# Patient Record
Sex: Male | Born: 1955 | Race: White | Hispanic: No | Marital: Married | Smoking: Current some day smoker
Health system: Southern US, Community
[De-identification: ages and names within clinical notes are randomized; demographics above are authoritative.]

## PROBLEM LIST (undated history)

## (undated) DIAGNOSIS — K219 Gastro-esophageal reflux disease without esophagitis: Secondary | ICD-10-CM

## (undated) DIAGNOSIS — I219 Acute myocardial infarction, unspecified: Secondary | ICD-10-CM

## (undated) DIAGNOSIS — I10 Essential (primary) hypertension: Secondary | ICD-10-CM

## (undated) DIAGNOSIS — E119 Type 2 diabetes mellitus without complications: Secondary | ICD-10-CM

## (undated) DIAGNOSIS — I2510 Atherosclerotic heart disease of native coronary artery without angina pectoris: Secondary | ICD-10-CM

## (undated) DIAGNOSIS — J45909 Unspecified asthma, uncomplicated: Secondary | ICD-10-CM

## (undated) DIAGNOSIS — Z87442 Personal history of urinary calculi: Secondary | ICD-10-CM

## (undated) DIAGNOSIS — E039 Hypothyroidism, unspecified: Secondary | ICD-10-CM

## (undated) DIAGNOSIS — E785 Hyperlipidemia, unspecified: Secondary | ICD-10-CM

## (undated) DIAGNOSIS — R011 Cardiac murmur, unspecified: Secondary | ICD-10-CM

## (undated) DIAGNOSIS — G4730 Sleep apnea, unspecified: Secondary | ICD-10-CM

## (undated) HISTORY — DX: Type 2 diabetes mellitus without complications: E11.9

## (undated) HISTORY — PX: THYROIDECTOMY: SHX17

## (undated) HISTORY — DX: Hypothyroidism, unspecified: E03.9

## (undated) HISTORY — PX: GANGLION CYST EXCISION: SHX1691

## (undated) HISTORY — DX: Essential (primary) hypertension: I10

## (undated) HISTORY — PX: NASAL SEPTUM SURGERY: SHX37

## (undated) HISTORY — PX: CORONARY ANGIOPLASTY WITH STENT PLACEMENT: SHX49

## (undated) HISTORY — DX: Hyperlipidemia, unspecified: E78.5

## (undated) HISTORY — DX: Sleep apnea, unspecified: G47.30

## (undated) HISTORY — DX: Acute myocardial infarction, unspecified: I21.9

## (undated) HISTORY — DX: Atherosclerotic heart disease of native coronary artery without angina pectoris: I25.10

---

## 1991-01-31 HISTORY — PX: CARDIAC CATHETERIZATION: SHX172

## 1992-01-31 HISTORY — PX: CORONARY ANGIOPLASTY WITH STENT PLACEMENT: SHX49

## 1994-01-30 DIAGNOSIS — I219 Acute myocardial infarction, unspecified: Secondary | ICD-10-CM

## 1994-01-30 HISTORY — DX: Acute myocardial infarction, unspecified: I21.9

## 1994-01-30 HISTORY — PX: CORONARY ANGIOPLASTY WITH STENT PLACEMENT: SHX49

## 2000-01-31 HISTORY — PX: CARDIAC CATHETERIZATION: SHX172

## 2004-01-31 HISTORY — PX: CORONARY ANGIOPLASTY WITH STENT PLACEMENT: SHX49

## 2011-01-06 LAB — LIPID PANEL
HDL: 47 mg/dL (ref 35–70)
LDL Cholesterol: 67 mg/dL

## 2011-03-26 LAB — HM DIABETES EYE EXAM

## 2012-05-21 ENCOUNTER — Encounter: Payer: Self-pay | Admitting: Cardiovascular Disease

## 2012-05-21 ENCOUNTER — Ambulatory Visit (INDEPENDENT_AMBULATORY_CARE_PROVIDER_SITE_OTHER): Payer: BC Managed Care – PPO | Admitting: Cardiovascular Disease

## 2012-05-21 VITALS — BP 120/86 | HR 80 | Ht 68.0 in | Wt 213.2 lb

## 2012-05-21 DIAGNOSIS — Z9861 Coronary angioplasty status: Secondary | ICD-10-CM

## 2012-05-21 DIAGNOSIS — I10 Essential (primary) hypertension: Secondary | ICD-10-CM | POA: Insufficient documentation

## 2012-05-21 DIAGNOSIS — E785 Hyperlipidemia, unspecified: Secondary | ICD-10-CM | POA: Insufficient documentation

## 2012-05-21 DIAGNOSIS — Z955 Presence of coronary angioplasty implant and graft: Secondary | ICD-10-CM | POA: Insufficient documentation

## 2012-05-21 DIAGNOSIS — E119 Type 2 diabetes mellitus without complications: Secondary | ICD-10-CM

## 2012-05-21 DIAGNOSIS — I2510 Atherosclerotic heart disease of native coronary artery without angina pectoris: Secondary | ICD-10-CM

## 2012-05-21 MED ORDER — AMLODIPINE BESYLATE 5 MG PO TABS: 5.0000 mg | ORAL_TABLET | Freq: Every day | ORAL | Status: DC

## 2012-05-21 MED ORDER — RAMIPRIL 5 MG PO CAPS: 5.0000 mg | ORAL_CAPSULE | Freq: Every day | ORAL | Status: DC

## 2012-05-21 MED ORDER — SIMVASTATIN 40 MG PO TABS: 40.0000 mg | ORAL_TABLET | Freq: Every day | ORAL | Status: DC

## 2012-05-21 MED ORDER — CLOPIDOGREL BISULFATE 75 MG PO TABS: 75.0000 mg | ORAL_TABLET | Freq: Every day | ORAL | Status: DC

## 2012-05-21 MED ORDER — METOPROLOL TARTRATE 25 MG PO TABS: 25.0000 mg | ORAL_TABLET | Freq: Two times a day (BID) | ORAL | Status: DC

## 2012-05-21 NOTE — Assessment & Plan Note (Signed)
We have suggested he O2 his appointment on Thursday fasting. Perhaps at that time they could do routine blood work including lipids and LFTs.

## 2012-05-21 NOTE — Patient Instructions (Addendum)
You are doing well. Please start plavix one a day  Please call us if you have new issues that need to be addressed before your next appt.  Your physician wants you to follow-up in: 6 months.  You will receive a reminder letter in the mail two months in advance. If you don't receive a letter, please call our office to schedule the follow-up appointment.

## 2012-05-21 NOTE — Assessment & Plan Note (Signed)
He reports that his sugars are running high, higher than he would like. We have encouraged him to talk with Dr. Ermalene Searing, watch his diet and increase exercise in an effort to lose weight.

## 2012-05-21 NOTE — Progress Notes (Signed)
Patient ID: Juan [name], male    DOB: 01-02-1956, 57 y.o.   MRN: [id number]  HPI Comments: Mr. Juan [name] is a very pleasant 57 year old with a history of premature coronary artery disease, multiple cardiac catheterizations with intervention in the past dating back to 1994, last catheterization in 2006 by his report in [place], total of 8 stents per the patient who presents to establish care. History of diabetes and hyperlipidemia.  He has been in the state for one year or less and presenting to establish care. He denies having any symptoms of shortness of breath or chest pain. He is very active, works in a [employer]. He reports that his sugars have been higher than he would like. He is also running out of medications. Previously prior to stenting, he had chest pain. He denies any recent chest pain symptoms of shortness of breath with exertion.  No smoking history. Reports his father also had coronary artery disease in his 8s.  EKG shows normal sinus rhythm with rate 80 beats per minute with no significant ST or T wave changes.      Outpatient Encounter Prescriptions as of 05/21/2012  Medication Sig Dispense Refill  . amLODipine (NORVASC) 5 MG tablet Take 1 tablet (5 mg total) by mouth daily.  90 tablet  3  . aspirin 81 MG tablet Take 81 mg by mouth daily.      . B Complex Vitamins (VITAMIN B COMPLEX PO) Take by mouth daily.      . folic acid (FOLVITE) 800 MCG tablet Take 400 mcg by mouth daily.      Marland Kitchen glimepiride (AMARYL) 2 MG tablet Take 2 mg by mouth daily before breakfast.      . levothyroxine (SYNTHROID, LEVOTHROID) 100 MCG tablet Take 100 mcg by mouth daily.       . metFORMIN (GLUCOPHAGE) 500 MG tablet Take 500 mg by mouth 2 (two) times daily with a meal.       . metoprolol tartrate (LOPRESSOR) 25 MG tablet Take 1 tablet (25 mg total) by mouth 2 (two) times daily.  180 tablet  3  . Multiple Vitamin (MULTIVITAMIN) tablet Take 1 tablet by mouth daily.      . nitroGLYCERIN  (NITROSTAT) 0.4 MG SL tablet Place 0.4 mg under the tongue every 5 (five) minutes as needed for chest pain.      . Omega-3 Fatty Acids (FISH OIL PO) Take by mouth daily.      . ramipril (ALTACE) 5 MG capsule Take 1 capsule (5 mg total) by mouth daily.  90 capsule  3  . simvastatin (ZOCOR) 40 MG tablet Take 1 tablet (40 mg total) by mouth daily.  90 tablet  3   Review of Systems  Constitutional: Negative.   HENT: Negative.   Eyes: Negative.   Respiratory: Negative.   Cardiovascular: Negative.   Gastrointestinal: Negative.   Musculoskeletal: Negative.   Skin: Negative.   Neurological: Negative.   Psychiatric/Behavioral: Negative.   All other systems reviewed and are negative.    BP 120/86  Pulse 80  Ht 5\' 8"  (1.727 m)  Wt 213 lb 4 oz (96.73 kg)  BMI 32.43 kg/m2  Physical Exam  Nursing note and vitals reviewed. Constitutional: He is oriented to person, place, and time. He appears well-developed and well-nourished.  HENT:  Head: Normocephalic.  Nose: Nose normal.  Mouth/Throat: Oropharynx is clear and moist.  Eyes: Conjunctivae are normal. Pupils are equal, round, and reactive to light.  Neck: Normal range of motion.  Neck supple. No JVD present.  Cardiovascular: Normal rate, regular rhythm, S1 normal, S2 normal, normal heart sounds and intact distal pulses.  Exam reveals no gallop and no friction rub.   No murmur heard. Pulmonary/Chest: Effort normal and breath sounds normal. No respiratory distress. He has no wheezes. He has no rales. He exhibits no tenderness.  Abdominal: Soft. Bowel sounds are normal. He exhibits no distension. There is no tenderness.  Musculoskeletal: Normal range of motion. He exhibits no edema and no tenderness.  Lymphadenopathy:    He has no cervical adenopathy.  Neurological: He is alert and oriented to person, place, and time. Coordination normal.  Skin: Skin is warm and dry. No rash noted. No erythema.  Psychiatric: He has a normal mood and affect.  His behavior is normal. Judgment and thought content normal.      Assessment and Plan        will

## 2012-05-21 NOTE — Assessment & Plan Note (Signed)
Blood pressure is well controlled on today's visit. No changes made to the medications. We have renewed his medications.

## 2012-05-21 NOTE — Assessment & Plan Note (Signed)
Currently with no symptoms of angina. No further workup at this time. Continue current medication regimen. 

## 2012-05-21 NOTE — Assessment & Plan Note (Signed)
We will try to obtain his prior records are our system. He reports having 8 stents in the past. Currently with no symptoms.

## 2012-05-23 ENCOUNTER — Ambulatory Visit (INDEPENDENT_AMBULATORY_CARE_PROVIDER_SITE_OTHER): Payer: BC Managed Care – PPO | Admitting: Family Medicine

## 2012-05-23 ENCOUNTER — Telehealth: Payer: Self-pay | Admitting: Family Medicine

## 2012-05-23 ENCOUNTER — Encounter: Payer: Self-pay | Admitting: Family Medicine

## 2012-05-23 VITALS — BP 130/82 | HR 88 | Temp 98.1°F | Ht 68.0 in | Wt 208.4 lb

## 2012-05-23 DIAGNOSIS — E890 Postprocedural hypothyroidism: Secondary | ICD-10-CM

## 2012-05-23 DIAGNOSIS — E119 Type 2 diabetes mellitus without complications: Secondary | ICD-10-CM

## 2012-05-23 DIAGNOSIS — I10 Essential (primary) hypertension: Secondary | ICD-10-CM

## 2012-05-23 DIAGNOSIS — Z9889 Other specified postprocedural states: Secondary | ICD-10-CM

## 2012-05-23 DIAGNOSIS — E785 Hyperlipidemia, unspecified: Secondary | ICD-10-CM

## 2012-05-23 DIAGNOSIS — Z9009 Acquired absence of other part of head and neck: Secondary | ICD-10-CM | POA: Insufficient documentation

## 2012-05-23 DIAGNOSIS — Z125 Encounter for screening for malignant neoplasm of prostate: Secondary | ICD-10-CM

## 2012-05-23 DIAGNOSIS — Z0000 Encounter for general adult medical examination without abnormal findings: Secondary | ICD-10-CM

## 2012-05-23 LAB — COMPREHENSIVE METABOLIC PANEL
BUN: 13 mg/dL (ref 6–23)
CO2: 29 mEq/L (ref 19–32)
Calcium: 9.4 mg/dL (ref 8.4–10.5)
Chloride: 98 mEq/L (ref 96–112)
Creatinine, Ser: 0.8 mg/dL (ref 0.4–1.5)
GFR: 103.36 mL/min (ref >60.00)
Glucose, Bld: 247 mg/dL — ABNORMAL HIGH (ref 70–99)

## 2012-05-23 LAB — LIPID PANEL
HDL: 33.7 mg/dL — ABNORMAL LOW (ref >39.00)
Triglycerides: 445 mg/dL — ABNORMAL HIGH (ref 0.0–149.0)

## 2012-05-23 MED ORDER — GLIMEPIRIDE 4 MG PO TABS: ORAL_TABLET | ORAL | Status: DC

## 2012-05-23 NOTE — Patient Instructions (Addendum)
MAke appt for yearly eye exam. We will call with lab results. Follow up in 3 months with fasting labs prior.

## 2012-05-23 NOTE — Telephone Encounter (Signed)
Notify pt that LDL is at goal on simvastatin, triglycerides high likely reflecting too many carbs. A1C suggests very poorly controlled diabetes. He is on amaryl and max metformin. He can increase amaryl to  4 mg  Two tabs daily for total of 8 mg daily. This will likely help a little but not much. We may need to add an additional med.   Have him work on low carb, increase exercise and work on weight loss. Let me know if he is agreeable to nutrition referral. Have him make a follow up appt in 1 mth... Bring in his daily fasting blood sugar measurements at that OV. We can adjust/ add med if not at goal at that time.

## 2012-05-23 NOTE — Progress Notes (Signed)
Subjective:    Patient ID: Juan [name], male    DOB: Feb 19, 1955, 57 y.o.   MRN: [id number]  HPI 57 year old male moved from [place] 1 year ago. Juan [name] is a very pleasant 57 year old with a history of premature coronary artery disease, multiple cardiac catheterizations with intervention in the past dating back to 1994, last catheterization in 2006 by his report in [place], total of 8 stents per the patient who presents to establish care.   Last OV with PCP in [place] was 12/2010  Saw cardiologist  In [place] 03/2011  He established with Dr. Mariah Milling on 05/21/2012  Started on plavix.  He denies having any symptoms of shortness of breath or chest pain. He is very active, works in a [employer] engineer, Education officer, community. He reports that his sugars have been higher than he would like. He is also running out of medications. Previously prior to stenting, he had chest pain. He denies any recent chest pain symptoms of shortness of breath with exertion.  No smoking history.  Reports his father also had coronary artery disease in his 17s.    Diabetes: Dx 3 years ago. On amaryl and metfromin ER . Last A1C 7.1 in 12/2010 Using medications without difficulties:None Hypoglycemic episodes: None Hyperglycemic episodes: yes Feet problems:None Blood Sugars averaging: 120-220 eye exam within last year: yes    Hypertension:  Well controlled on  Amlodipine, metoprolol and ramipril   Using medication without problems or lightheadedness: None Chest pain with exertion:None Edema:none Short of breath:None Average home BPs: at goal <130/80 Other issues:  Elevated Cholesterol: Due for re-eval. Goal <70 given CAD  LFTs increased with crestor and lipitor in past. Using medications without problems: None Muscle aches: None Diet compliance:Moderate Exercise: walks Other complaints:   Review of Systems  Constitutional: Negative for fever, fatigue and unexpected weight change.  HENT:  Negative for ear pain, congestion, sore throat, rhinorrhea, trouble swallowing and postnasal drip.   Eyes: Negative for pain.  Respiratory: Negative for cough, shortness of breath and wheezing.   Cardiovascular: Negative for chest pain, palpitations and leg swelling.  Gastrointestinal: Negative for nausea, abdominal pain, diarrhea, constipation and blood in stool.  Genitourinary: Negative for dysuria, urgency, hematuria, discharge, penile swelling, scrotal swelling, difficulty urinating, penile pain and testicular pain.  Skin: Negative for rash.  Neurological: Negative for syncope, weakness, light-headedness, numbness and headaches.  Psychiatric/Behavioral: Negative for behavioral problems and dysphoric mood. The patient is not nervous/anxious.        Objective:   Physical Exam  Constitutional: Vital signs are normal. He appears well-developed and well-nourished.  overweight  HENT:  Head: Normocephalic.  Right Ear: Hearing normal.  Left Ear: Hearing normal.  Nose: Nose normal.  Mouth/Throat: Oropharynx is clear and moist and mucous membranes are normal.  Neck: Trachea normal. Carotid bruit is not present. No mass and no thyromegaly present.  Cardiovascular: Normal rate, regular rhythm and normal pulses.  Exam reveals no gallop, no distant heart sounds and no friction rub.   No murmur heard. No peripheral edema  Pulmonary/Chest: Effort normal and breath sounds normal. No respiratory distress.  Skin: Skin is warm, dry and intact. No rash noted.  Psychiatric: He has a normal mood and affect. His speech is normal and behavior is normal. Thought content normal.   Diabetic foot exam: Normal inspection No skin breakdown No calluses  Normal DP pulses Normal sensation to light touch and monofilament Nails normal        Assessment & Plan:  The patient's preventative maintenance and recommended screening tests for an annual wellness exam were reviewed in full today. Brought up to  date unless services declined.  Counselled on the importance of diet, exercise, and its role in overall health and mortality. The patient's FH and SH was reviewed, including their home life, tobacco status, and drug and alcohol status.   Colonoscopy 12/2010: 1 polyp repeat in 5 years Prostate; due for PSA Vaccines: TD ? But thinks in last 4-5 years. No STD screen  Non smoker

## 2012-05-24 ENCOUNTER — Telehealth: Payer: Self-pay | Admitting: *Deleted

## 2012-05-24 MED ORDER — GLIMEPIRIDE 4 MG PO TABS: 8.0000 mg | ORAL_TABLET | Freq: Every day | ORAL | Status: DC

## 2012-05-24 NOTE — Addendum Note (Signed)
Addended by: Kerby Nora E on: 05/24/2012 01:48 PM   Modules accepted: Orders

## 2012-05-24 NOTE — Telephone Encounter (Signed)
Patient advised and he is okay with referral also appt made

## 2012-05-24 NOTE — Telephone Encounter (Signed)
Pharmacy ([facility] [address]) wants to clarify if pt is to take 2 mg of glimepiride 4 mg rx or 2 tablets daily.

## 2012-05-24 NOTE — Telephone Encounter (Signed)
I am trying to increase him up to 4 mg  Two tabs daily ( 8 mg total daily)

## 2012-06-06 ENCOUNTER — Telehealth: Payer: Self-pay | Admitting: Family Medicine

## 2012-06-06 NOTE — Telephone Encounter (Signed)
Opened in error

## 2012-06-17 ENCOUNTER — Other Ambulatory Visit: Payer: Self-pay | Admitting: *Deleted

## 2012-06-17 MED ORDER — METFORMIN HCL ER 500 MG PO TB24: 1000.0000 mg | ORAL_TABLET | Freq: Every day | ORAL | Status: DC

## 2012-06-20 ENCOUNTER — Ambulatory Visit: Payer: Self-pay | Admitting: Family Medicine

## 2012-06-28 ENCOUNTER — Encounter: Payer: Self-pay | Admitting: Family Medicine

## 2012-06-28 ENCOUNTER — Ambulatory Visit (INDEPENDENT_AMBULATORY_CARE_PROVIDER_SITE_OTHER): Payer: BC Managed Care – PPO | Admitting: Family Medicine

## 2012-06-28 VITALS — BP 120/78 | HR 79 | Temp 98.5°F | Ht 68.0 in | Wt 212.5 lb

## 2012-06-28 DIAGNOSIS — E785 Hyperlipidemia, unspecified: Secondary | ICD-10-CM

## 2012-06-28 DIAGNOSIS — E119 Type 2 diabetes mellitus without complications: Secondary | ICD-10-CM

## 2012-06-28 DIAGNOSIS — I10 Essential (primary) hypertension: Secondary | ICD-10-CM

## 2012-06-28 NOTE — Assessment & Plan Note (Signed)
Will continue max metformin and max glimperide and add exercise and weight loss. Diet is already stellar.  Recheck in 3 months.

## 2012-06-28 NOTE — Assessment & Plan Note (Signed)
Well controlled. Continue current medication.  

## 2012-06-28 NOTE — Patient Instructions (Addendum)
Working on increasing exercise. Keep up with healthy eating. Continue current meds. Move back next OV and fasting labs prior to 3 months from now NOT 2 months.Marland Kitchen

## 2012-06-28 NOTE — Assessment & Plan Note (Signed)
Inadequate control. Has not tolerated  crestor or lipitor in past due to LFT elevation.  Will work on lifestyle

## 2012-06-28 NOTE — Progress Notes (Signed)
  Subjective:    Patient ID: Juan [name], male    DOB: [birth year], 56 y.o.   MRN: [id number]  HPI  57 year old male with CAD presents with  Hypertension:   Well controlled on metoprolol and altace  Using medication without problems or lightheadedness: none Chest pain with exertion:None Edema:None Short of breath:None Average home BPs: stable Other issues:  Wt Readings from Last 3 Encounters:  06/28/12 212 lb 8 oz (96.389 kg)  05/23/12 208 lb 6.4 oz (94.53 kg)  05/21/12 213 lb 4 oz (96.73 kg)     Diabetes:  On metformin, glimperide 2 tabs a day.  A1C is from before being on higher glimperide. Lab Results  Component Value Date   HGBA1C 8.9* 05/23/2012  Using medications without difficulties: Hypoglycemic episodes: none Hyperglycemic episodes:occ Feet problems: None Blood Sugars averaging: FBS 170 Has seen a nutritionist.  he is working aggressively on diet. Minimal exercsie.  Elevated Cholesterol: Almost at goal < 70 on 40 mg simvastatin daily Lab Results  Component Value Date   CHOL 211* 05/23/2012   HDL 33.70* 05/23/2012   LDLDIRECT 94.2 05/23/2012   TRIG 445.0 Triglyceride is over 400; calculations on Lipids are invalid.* 05/23/2012   CHOLHDL 6 05/23/2012  Using medications without problems: Muscle aches:  Diet compliance: Exercise: Other complaints:   Review of Systems     Objective:   Physical Exam        Assessment & Plan:

## 2012-06-30 ENCOUNTER — Ambulatory Visit: Payer: Self-pay | Admitting: Family Medicine

## 2012-07-10 ENCOUNTER — Encounter: Payer: Self-pay | Admitting: *Deleted

## 2012-07-23 ENCOUNTER — Other Ambulatory Visit: Payer: Self-pay | Admitting: *Deleted

## 2012-07-23 MED ORDER — METFORMIN HCL ER 500 MG PO TB24: 1000.0000 mg | ORAL_TABLET | Freq: Two times a day (BID) | ORAL | Status: DC

## 2012-07-23 MED ORDER — GLIMEPIRIDE 4 MG PO TABS: 8.0000 mg | ORAL_TABLET | Freq: Every day | ORAL | Status: DC

## 2012-07-23 MED ORDER — LEVOTHYROXINE SODIUM 100 MCG PO TABS: 100.0000 ug | ORAL_TABLET | Freq: Every day | ORAL | Status: DC

## 2012-07-23 MED ORDER — METOPROLOL TARTRATE 25 MG PO TABS: 25.0000 mg | ORAL_TABLET | Freq: Two times a day (BID) | ORAL | Status: DC

## 2012-07-23 NOTE — Telephone Encounter (Signed)
Metoprolol sent to [facility] 90 day supply.

## 2012-07-29 ENCOUNTER — Other Ambulatory Visit: Payer: Self-pay | Admitting: *Deleted

## 2012-07-29 MED ORDER — RAMIPRIL 5 MG PO CAPS: 5.0000 mg | ORAL_CAPSULE | Freq: Every day | ORAL | Status: DC

## 2012-07-29 NOTE — Telephone Encounter (Signed)
Refilled Ramipril sent to [facility].

## 2012-08-05 ENCOUNTER — Other Ambulatory Visit: Payer: Self-pay

## 2012-08-05 MED ORDER — AMLODIPINE BESYLATE 5 MG PO TABS: 5.0000 mg | ORAL_TABLET | Freq: Every day | ORAL | Status: DC

## 2012-08-05 NOTE — Telephone Encounter (Signed)
Refill sent for amlodipine.  

## 2012-08-08 ENCOUNTER — Other Ambulatory Visit: Payer: Self-pay | Admitting: *Deleted

## 2012-08-08 MED ORDER — SIMVASTATIN 40 MG PO TABS: 40.0000 mg | ORAL_TABLET | Freq: Every day | ORAL | Status: DC

## 2012-08-08 NOTE — Telephone Encounter (Signed)
Refilled Simvastatin sent to [facility].

## 2012-08-15 ENCOUNTER — Other Ambulatory Visit: Payer: BC Managed Care – PPO

## 2012-08-22 ENCOUNTER — Other Ambulatory Visit: Payer: Self-pay | Admitting: *Deleted

## 2012-08-22 ENCOUNTER — Ambulatory Visit: Payer: BC Managed Care – PPO | Admitting: Family Medicine

## 2012-08-22 MED ORDER — CLOPIDOGREL BISULFATE 75 MG PO TABS: 75.0000 mg | ORAL_TABLET | Freq: Every day | ORAL | Status: DC

## 2012-08-22 NOTE — Telephone Encounter (Signed)
Refill sent to [facility] for Clopidogrel #90 R#3.

## 2012-09-23 ENCOUNTER — Telehealth: Payer: Self-pay | Admitting: Family Medicine

## 2012-09-23 DIAGNOSIS — E785 Hyperlipidemia, unspecified: Secondary | ICD-10-CM

## 2012-09-23 DIAGNOSIS — E119 Type 2 diabetes mellitus without complications: Secondary | ICD-10-CM

## 2012-09-23 NOTE — Telephone Encounter (Signed)
Message copied by Excell Seltzer on Mon Sep 23, 2012 10:38 PM ------      Message from: Alvina Chou      Created: Wed Sep 18, 2012  3:05 PM      Regarding: Lab orders for Tuesday, 8.26.14       Labs for a 3 month f/u ------

## 2012-09-24 ENCOUNTER — Other Ambulatory Visit (INDEPENDENT_AMBULATORY_CARE_PROVIDER_SITE_OTHER): Payer: BC Managed Care – PPO

## 2012-09-24 DIAGNOSIS — I10 Essential (primary) hypertension: Secondary | ICD-10-CM

## 2012-09-24 DIAGNOSIS — E785 Hyperlipidemia, unspecified: Secondary | ICD-10-CM

## 2012-09-24 DIAGNOSIS — E119 Type 2 diabetes mellitus without complications: Secondary | ICD-10-CM

## 2012-09-24 LAB — LIPID PANEL
Cholesterol: 149 mg/dL (ref 0–200)
HDL: 36.6 mg/dL — ABNORMAL LOW (ref >39.00)
Total CHOL/HDL Ratio: 4
Triglycerides: 345 mg/dL — ABNORMAL HIGH (ref 0.0–149.0)
VLDL: 69 mg/dL — ABNORMAL HIGH (ref 0.0–40.0)

## 2012-09-24 LAB — COMPREHENSIVE METABOLIC PANEL
ALT: 37 U/L (ref 0–53)
BUN: 19 mg/dL (ref 6–23)
CO2: 27 mEq/L (ref 19–32)
Calcium: 9.1 mg/dL (ref 8.4–10.5)
Chloride: 104 mEq/L (ref 96–112)
Creatinine, Ser: 1 mg/dL (ref 0.4–1.5)
GFR: 86.07 mL/min (ref >60.00)
Glucose, Bld: 194 mg/dL — ABNORMAL HIGH (ref 70–99)

## 2012-09-24 LAB — HEMOGLOBIN A1C: Hgb A1c MFr Bld: 7.5 % — ABNORMAL HIGH (ref 4.6–6.5)

## 2012-10-01 ENCOUNTER — Encounter: Payer: Self-pay | Admitting: Family Medicine

## 2012-10-01 ENCOUNTER — Ambulatory Visit (INDEPENDENT_AMBULATORY_CARE_PROVIDER_SITE_OTHER): Payer: BC Managed Care – PPO | Admitting: Family Medicine

## 2012-10-01 VITALS — BP 118/76 | HR 91 | Temp 98.4°F | Ht 68.0 in | Wt 212.5 lb

## 2012-10-01 DIAGNOSIS — Z9009 Acquired absence of other part of head and neck: Secondary | ICD-10-CM

## 2012-10-01 DIAGNOSIS — E785 Hyperlipidemia, unspecified: Secondary | ICD-10-CM

## 2012-10-01 DIAGNOSIS — I10 Essential (primary) hypertension: Secondary | ICD-10-CM

## 2012-10-01 DIAGNOSIS — Z9889 Other specified postprocedural states: Secondary | ICD-10-CM

## 2012-10-01 DIAGNOSIS — E890 Postprocedural hypothyroidism: Secondary | ICD-10-CM

## 2012-10-01 DIAGNOSIS — E119 Type 2 diabetes mellitus without complications: Secondary | ICD-10-CM

## 2012-10-01 NOTE — Assessment & Plan Note (Signed)
LDL now at goal on simvastatin 40 mg daily. Trig still elevated... Discussed lifestyle changes, increase fish oil further.  Recheck in 3 months.

## 2012-10-01 NOTE — Patient Instructions (Addendum)
Stop at lab on way out. Stay of levothyroxine for now. Make sure you are taking at least 2000 mg divided daily, if dso increase further to 3000 - 4000mg  daily. Continue current medication otherwise. Continue to increase exercise. Follow up diabetes in 3 month with fasting labs prior

## 2012-10-01 NOTE — Assessment & Plan Note (Signed)
Re-eval TSH off med.

## 2012-10-01 NOTE — Assessment & Plan Note (Signed)
Well controlled. Continue current medication.  

## 2012-10-01 NOTE — Assessment & Plan Note (Signed)
Significant improvement in control with addition of some exercise and diet changes. Continue current meds and making lifestyle changes. Recheck A1C in 3 months.

## 2012-10-01 NOTE — Progress Notes (Signed)
  Subjective:    Patient ID: Juan [name], male    DOB: November 01, 1955, 57 y.o.   MRN: [id number]  HPI  57 year old male with CAD presents with  Hypertension: Well controlled on metoprolol and altace  Using medication without problems or lightheadedness: none  Chest pain with exertion:None  Edema:None  Short of breath:None  Average home BPs: stable  Other issues:  Wt Readings from Last 3 Encounters:  10/01/12 212 lb 8 oz (96.389 kg)  06/28/12 212 lb 8 oz (96.389 kg)  05/23/12 208 lb 6.4 oz (94.53 kg)   Diabetes: Significant improvement on metformin, glimperide 2 tabs a day.  A1C is from before being on higher glimperide.  Lab Results  Component Value Date   HGBA1C 7.5* 09/24/2012   Using medications without difficulties:  Hypoglycemic episodes: none  Hyperglycemic episodes:occ  Feet problems: None  Blood Sugars averaging: FBS 160  Has seen a nutritionist.  He is working aggressively on diet.  Stopping oatmeal., Eating more salads. Minimal exercise.   Elevated Cholesterol: LDL now at goal < 70 on 40 mg simvastatin daily  Trig better but still high. Lab Results  Component Value Date   CHOL 149 09/24/2012   HDL 36.60* 09/24/2012   LDLCALC 67 01/06/2011   LDLDIRECT 62.8 09/24/2012   TRIG 345.0* 09/24/2012   CHOLHDL 4 09/24/2012   Using medications without problems:   He feels that when he stops  levothyroxine... His chol and sugar numbers are better. He has been off for 2 weeks. 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   Review of Systems  Constitutional: Negative for fever and fatigue.  HENT: Negative for ear pain.   Eyes: Negative for pain.  Respiratory: Negative for cough and shortness of breath.   Cardiovascular: Negative for chest pain, palpitations and leg swelling.  Gastrointestinal: Negative for abdominal pain.       Objective:   Physical Exam  Constitutional: He is oriented to person, place, and time. Vital signs are normal. He appears  well-developed and well-nourished.  HENT:  Head: Normocephalic.  Right Ear: Hearing normal.  Left Ear: Hearing normal.  Nose: Nose normal.  Mouth/Throat: Oropharynx is clear and moist and mucous membranes are normal.  Neck: Trachea normal. Carotid bruit is not present. No mass and no thyromegaly present.  Cardiovascular: Normal rate, regular rhythm and normal pulses.  Exam reveals no gallop, no distant heart sounds and no friction rub.   No murmur heard. No peripheral edema  Pulmonary/Chest: Effort normal and breath sounds normal. No respiratory distress.  Abdominal: Soft. There is no tenderness.  Neurological: He is alert and oriented to person, place, and time.  Skin: Skin is warm, dry and intact. No rash noted.  Psychiatric: He has a normal mood and affect. His speech is normal and behavior is normal. Thought content normal.          Assessment & Plan:

## 2012-11-21 ENCOUNTER — Encounter: Payer: Self-pay | Admitting: Cardiovascular Disease

## 2012-11-21 ENCOUNTER — Ambulatory Visit (INDEPENDENT_AMBULATORY_CARE_PROVIDER_SITE_OTHER): Payer: BC Managed Care – PPO | Admitting: Cardiovascular Disease

## 2012-11-21 VITALS — BP 120/80 | HR 84 | Ht 68.0 in | Wt 216.5 lb

## 2012-11-21 DIAGNOSIS — I10 Essential (primary) hypertension: Secondary | ICD-10-CM

## 2012-11-21 DIAGNOSIS — I2510 Atherosclerotic heart disease of native coronary artery without angina pectoris: Secondary | ICD-10-CM

## 2012-11-21 DIAGNOSIS — E119 Type 2 diabetes mellitus without complications: Secondary | ICD-10-CM

## 2012-11-21 DIAGNOSIS — E785 Hyperlipidemia, unspecified: Secondary | ICD-10-CM

## 2012-11-21 NOTE — Assessment & Plan Note (Signed)
Blood pressure is well controlled on today's visit. No changes made to the medications. 

## 2012-11-21 NOTE — Assessment & Plan Note (Signed)
We have encouraged continued exercise, careful diet management in an effort to lose weight. 

## 2012-11-21 NOTE — Assessment & Plan Note (Signed)
Currently with no symptoms of angina. No further workup at this time. Continue current medication regimen. 

## 2012-11-21 NOTE — Patient Instructions (Signed)
You are doing well. No medication changes were made.  Monitor your blood pressure at home If it runs low, we would start to decrease the dose, may be in 1/2 or stop  Please call us if you have new issues that need to be addressed before your next appt.  Your physician wants you to follow-up in: 6 months.  You will receive a reminder letter in the mail two months in advance. If you don't receive a letter, please call our office to schedule the follow-up appointment.

## 2012-11-21 NOTE — Progress Notes (Signed)
Patient ID: Juan [name], male    DOB: 05-30-1955, 57 y.o.   MRN: [id number]  HPI Comments: [name] is a very pleasant 57 year old male with a history of premature coronary artery disease, multiple cardiac catheterizations with intervention in the past dating back to 1994, last catheterization in 2006 by his report in [place], total of 8 stents per the patient who presents for routine followup. History of diabetes and hyperlipidemia.  Overall he reports that he has been doing well. He is trying to watch his diet, try to lose weight. Continues to work long hours. He denies any significant chest pain or shortness of breath concerning for angina. Diabetes numbers have been slowly improving. Hemoglobin A1c recently 7.5 Total cholesterol 149. No smoking history. Reports his father also had coronary artery disease in his 79s.  EKG shows normal sinus rhythm with rate 84 beats per minute with no significant ST or T wave changes.      Outpatient Encounter Prescriptions as of 11/21/2012  Medication Sig Dispense Refill  . amLODipine (NORVASC) 5 MG tablet Take 1 tablet (5 mg total) by mouth daily.  90 tablet  3  . aspirin 81 MG tablet Take 81 mg by mouth daily.      . B Complex Vitamins (VITAMIN B COMPLEX PO) Take by mouth daily.      . clopidogrel (PLAVIX) 75 MG tablet Take 1 tablet (75 mg total) by mouth daily.  90 tablet  3  . folic acid (FOLVITE) 800 MCG tablet Take 400 mcg by mouth daily.      Marland Kitchen glimepiride (AMARYL) 4 MG tablet Take 2 tablets (8 mg total) by mouth daily before breakfast.  180 tablet  1  . metFORMIN (GLUCOPHAGE-XR) 500 MG 24 hr tablet Take 2 tablets (1,000 mg total) by mouth 2 (two) times daily.  360 tablet  1  . metoprolol tartrate (LOPRESSOR) 25 MG tablet Take 1 tablet (25 mg total) by mouth 2 (two) times daily.  180 tablet  3  . Multiple Vitamin (MULTIVITAMIN) tablet Take 1 tablet by mouth daily.      . nitroGLYCERIN (NITROSTAT) 0.4 MG SL tablet Place 0.4 mg under the  tongue every 5 (five) minutes as needed for chest pain.      . Omega-3 Fatty Acids (FISH OIL PO) Take by mouth 2 (two) times daily.       . ramipril (ALTACE) 5 MG capsule Take 1 capsule (5 mg total) by mouth daily.  90 capsule  3  . simvastatin (ZOCOR) 40 MG tablet Take 1 tablet (40 mg total) by mouth daily.  90 tablet  3  . [DISCONTINUED] levothyroxine (SYNTHROID, LEVOTHROID) 100 MCG tablet Take 1 tablet (100 mcg total) by mouth daily.  90 tablet  1   No facility-administered encounter medications on file as of 11/21/2012.    Review of Systems  Constitutional: Negative.   HENT: Negative.   Eyes: Negative.   Respiratory: Negative.   Cardiovascular: Negative.   Gastrointestinal: Negative.   Endocrine: Negative.   Musculoskeletal: Negative.   Skin: Negative.   Allergic/Immunologic: Negative.   Neurological: Negative.   Hematological: Negative.   Psychiatric/Behavioral: Negative.   All other systems reviewed and are negative.    BP 120/80  Pulse 84  Ht 5\' 8"  (1.727 m)  Wt 216 lb 8 oz (98.204 kg)  BMI 32.93 kg/m2  Physical Exam  Nursing note and vitals reviewed. Constitutional: He is oriented to person, place, and time. He appears well-developed and well-nourished.  HENT:  Head: Normocephalic.  Nose: Nose normal.  Mouth/Throat: Oropharynx is clear and moist.  Eyes: Conjunctivae are normal. Pupils are equal, round, and reactive to light.  Neck: Normal range of motion. Neck supple. No JVD present.  Cardiovascular: Normal rate, regular rhythm, S1 normal, S2 normal, normal heart sounds and intact distal pulses.  Exam reveals no gallop and no friction rub.   No murmur heard. Pulmonary/Chest: Effort normal and breath sounds normal. No respiratory distress. He has no wheezes. He has no rales. He exhibits no tenderness.  Abdominal: Soft. Bowel sounds are normal. He exhibits no distension. There is no tenderness.  Musculoskeletal: Normal range of motion. He exhibits no edema and no  tenderness.  Lymphadenopathy:    He has no cervical adenopathy.  Neurological: He is alert and oriented to person, place, and time. Coordination normal.  Skin: Skin is warm and dry. No rash noted. No erythema.  Psychiatric: He has a normal mood and affect. His behavior is normal. Judgment and thought content normal.      Assessment and Plan        will

## 2012-11-21 NOTE — Assessment & Plan Note (Signed)
Cholesterol is at goal on the current lipid regimen. No changes to the medications were made.  

## 2012-12-05 ENCOUNTER — Other Ambulatory Visit: Payer: Self-pay

## 2012-12-13 ENCOUNTER — Other Ambulatory Visit: Payer: Self-pay | Admitting: *Deleted

## 2012-12-13 MED ORDER — METFORMIN HCL ER 500 MG PO TB24: 1000.0000 mg | ORAL_TABLET | Freq: Two times a day (BID) | ORAL | Status: DC

## 2012-12-23 ENCOUNTER — Other Ambulatory Visit (INDEPENDENT_AMBULATORY_CARE_PROVIDER_SITE_OTHER): Payer: BC Managed Care – PPO

## 2012-12-23 DIAGNOSIS — Z9009 Acquired absence of other part of head and neck: Secondary | ICD-10-CM

## 2012-12-23 DIAGNOSIS — E890 Postprocedural hypothyroidism: Secondary | ICD-10-CM

## 2012-12-23 DIAGNOSIS — Z9889 Other specified postprocedural states: Secondary | ICD-10-CM

## 2012-12-23 DIAGNOSIS — E785 Hyperlipidemia, unspecified: Secondary | ICD-10-CM

## 2012-12-23 DIAGNOSIS — E119 Type 2 diabetes mellitus without complications: Secondary | ICD-10-CM

## 2012-12-23 LAB — COMPREHENSIVE METABOLIC PANEL
AST: 35 U/L (ref 0–37)
Albumin: 4.2 g/dL (ref 3.5–5.2)
BUN: 14 mg/dL (ref 6–23)
CO2: 29 mEq/L (ref 19–32)
Calcium: 9.2 mg/dL (ref 8.4–10.5)
Chloride: 98 mEq/L (ref 96–112)
Creatinine, Ser: 0.9 mg/dL (ref 0.4–1.5)
GFR: 90.32 mL/min (ref >60.00)
Potassium: 4.2 mEq/L (ref 3.5–5.1)
Total Bilirubin: 0.8 mg/dL (ref 0.3–1.2)

## 2012-12-23 LAB — LIPID PANEL
HDL: 35.6 mg/dL — ABNORMAL LOW (ref >39.00)
Total CHOL/HDL Ratio: 5
Triglycerides: 553 mg/dL — ABNORMAL HIGH (ref 0.0–149.0)
VLDL: 110.6 mg/dL — ABNORMAL HIGH (ref 0.0–40.0)

## 2012-12-23 LAB — TSH: TSH: 6.51 u[IU]/mL — ABNORMAL HIGH (ref 0.35–5.50)

## 2012-12-23 LAB — HEMOGLOBIN A1C: Hgb A1c MFr Bld: 8.5 % — ABNORMAL HIGH (ref 4.6–6.5)

## 2012-12-30 ENCOUNTER — Other Ambulatory Visit: Payer: Self-pay | Admitting: *Deleted

## 2012-12-30 MED ORDER — GLIMEPIRIDE 4 MG PO TABS: 8.0000 mg | ORAL_TABLET | Freq: Every day | ORAL | Status: DC

## 2012-12-30 NOTE — Telephone Encounter (Signed)
Received faxed refill request from pharmacy. Refill sent to pharmacy electronically. 

## 2012-12-31 ENCOUNTER — Ambulatory Visit: Payer: BC Managed Care – PPO | Admitting: Family Medicine

## 2013-01-10 ENCOUNTER — Ambulatory Visit: Payer: BC Managed Care – PPO | Admitting: Family Medicine

## 2013-01-14 ENCOUNTER — Encounter: Payer: Self-pay | Admitting: Family Medicine

## 2013-01-14 ENCOUNTER — Ambulatory Visit (INDEPENDENT_AMBULATORY_CARE_PROVIDER_SITE_OTHER): Payer: BC Managed Care – PPO | Admitting: Family Medicine

## 2013-01-14 VITALS — BP 130/84 | HR 93 | Temp 98.5°F | Ht 68.0 in | Wt 212.8 lb

## 2013-01-14 DIAGNOSIS — H6123 Impacted cerumen, bilateral: Secondary | ICD-10-CM

## 2013-01-14 DIAGNOSIS — Z9889 Other specified postprocedural states: Secondary | ICD-10-CM

## 2013-01-14 DIAGNOSIS — E119 Type 2 diabetes mellitus without complications: Secondary | ICD-10-CM

## 2013-01-14 DIAGNOSIS — I10 Essential (primary) hypertension: Secondary | ICD-10-CM

## 2013-01-14 DIAGNOSIS — Z9009 Acquired absence of other part of head and neck: Secondary | ICD-10-CM

## 2013-01-14 DIAGNOSIS — H6120 Impacted cerumen, unspecified ear: Secondary | ICD-10-CM

## 2013-01-14 DIAGNOSIS — E785 Hyperlipidemia, unspecified: Secondary | ICD-10-CM

## 2013-01-14 DIAGNOSIS — E890 Postprocedural hypothyroidism: Secondary | ICD-10-CM

## 2013-01-14 NOTE — Assessment & Plan Note (Addendum)
He refuses medication change at this point.  Will restart exercise.   Recheck in 3 months... If not controlled at next OV consider victoza.

## 2013-01-14 NOTE — Progress Notes (Signed)
57 year old male with CAD presents for 3 month follow up.  Hypertension: Well controlled on metoprolol and altace  BP Readings from Last 3 Encounters:  01/14/13 130/84  11/21/12 120/80  10/01/12 118/76  Using medication without problems or lightheadedness: none  Chest pain with exertion:None  Edema:None  Short of breath:None  Average home BPs: stable  Other issues:  Wt Readings from Last 3 Encounters:  01/14/13 212 lb 12 oz (96.503 kg)  11/21/12 216 lb 8 oz (98.204 kg)  10/01/12 212 lb 8 oz (96.389 kg)   Diabetes: 1 point higher In last 3 months on metformin XR max, glimperide 2 tabs a day max.   Lab Results  Component Value Date   HGBA1C 8.5* 12/23/2012  Using medications without difficulties:  Hypoglycemic episodes: none  Hyperglycemic episodes:occ  Feet problems: None  Blood Sugars averaging: FBS 200-220 Has seen a nutritionist.  He is working aggressively on diet. Stopping oatmeal., Eating more salads.  Minimal exercise.   Elevated Cholesterol: LDL now at goal < 70 on 40 mg simvastatin daily Trig better very high.  Lab Results  Component Value Date   CHOL 161 12/23/2012   HDL 35.60* 12/23/2012   LDLCALC 67 01/06/2011   LDLDIRECT 52.1 12/23/2012   TRIG 553.0* 12/23/2012   CHOLHDL 5 12/23/2012  Using medications without problems:  None  He feels that when he stops levothyroxine... His chol and sugar numbers are better.   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 In last 3 months he has stopped his levothyroxine. Lab Results  Component Value Date   TSH 6.51* 12/23/2012  Thyroid now inadequately controlled, and chol and DM not any better.  he has been tired lately.  Review of Systems  Constitutional: Negative for fever and fatigue.  HENT: Negative for ear pain.  Eyes: Negative for pain.  Respiratory: Negative for cough and shortness of breath.  Cardiovascular: Negative for chest pain, palpitations and leg swelling.  Gastrointestinal:  Negative for abdominal pain.  Objective:   Physical Exam  Constitutional: He is oriented to person, place, and time. Vital signs are normal. He appears well-developed and well-nourished.  HENT:  Head: Normocephalic.  Right Ear: Hearing normal.  Left Ear: Hearing normal.  Nose: Nose normal.  Mouth/Throat: Oropharynx is clear and moist and mucous membranes are normal.  Neck: Trachea normal. Carotid bruit is not present. No mass and no thyromegaly present.  Cardiovascular: Normal rate, regular rhythm and normal pulses. Exam reveals no gallop, no distant heart sounds and no friction rub.  No murmur heard. No peripheral edema  Pulmonary/Chest: Effort normal and breath sounds normal. No respiratory distress.  Abdominal: Soft. There is no tenderness.  Neurological: He is alert and oriented to person, place, and time.  Skin: Skin is warm, dry and intact. No rash noted.  Psychiatric: He has a normal mood and affect. His speech is normal and behavior is normal. Thought content normal.    Diabetic foot exam: Normal inspection No skin breakdown No calluses  Normal DP pulses Normal sensation to light touch and monofilament Nails normal

## 2013-01-14 NOTE — Assessment & Plan Note (Signed)
LDL at goal but trig still high. Encouraged exercise, weight loss, healthy eating habits.

## 2013-01-14 NOTE — Patient Instructions (Addendum)
Restart thyroid medication.  Restart exercise aggressively. Follow up in 3 months with labs prior fasting. Look into Victoza as medication to add if not at goal at next OV.

## 2013-01-14 NOTE — Assessment & Plan Note (Signed)
Well controlled. Continue current medication.  

## 2013-01-14 NOTE — Progress Notes (Signed)
Pre-visit discussion using our clinic review tool. No additional management support is needed unless otherwise documented below in the visit note.  

## 2013-01-14 NOTE — Assessment & Plan Note (Signed)
Restart thyroid med given poor control off med.

## 2013-04-11 ENCOUNTER — Other Ambulatory Visit (INDEPENDENT_AMBULATORY_CARE_PROVIDER_SITE_OTHER): Payer: BC Managed Care – PPO

## 2013-04-11 DIAGNOSIS — E119 Type 2 diabetes mellitus without complications: Secondary | ICD-10-CM

## 2013-04-11 DIAGNOSIS — Z9889 Other specified postprocedural states: Secondary | ICD-10-CM

## 2013-04-11 DIAGNOSIS — E785 Hyperlipidemia, unspecified: Secondary | ICD-10-CM

## 2013-04-11 DIAGNOSIS — Z9009 Acquired absence of other part of head and neck: Secondary | ICD-10-CM

## 2013-04-11 DIAGNOSIS — E890 Postprocedural hypothyroidism: Secondary | ICD-10-CM

## 2013-04-11 LAB — COMPREHENSIVE METABOLIC PANEL
ALT: 41 U/L (ref 0–53)
AST: 37 U/L (ref 0–37)
Albumin: 4.4 g/dL (ref 3.5–5.2)
Alkaline Phosphatase: 60 U/L (ref 39–117)
BILIRUBIN TOTAL: 1 mg/dL (ref 0.3–1.2)
BUN: 18 mg/dL (ref 6–23)
CHLORIDE: 101 meq/L (ref 96–112)
CO2: 27 mEq/L (ref 19–32)
CREATININE: 1 mg/dL (ref 0.4–1.5)
Calcium: 9.5 mg/dL (ref 8.4–10.5)
GFR: 83.88 mL/min (ref >60.00)
Glucose, Bld: 249 mg/dL — ABNORMAL HIGH (ref 70–99)
Potassium: 4.3 mEq/L (ref 3.5–5.1)
Sodium: 137 mEq/L (ref 135–145)
Total Protein: 7.6 g/dL (ref 6.0–8.3)

## 2013-04-11 LAB — TSH: TSH: 2.05 u[IU]/mL (ref 0.35–5.50)

## 2013-04-11 LAB — HEMOGLOBIN A1C: Hgb A1c MFr Bld: 8.7 % — ABNORMAL HIGH (ref 4.6–6.5)

## 2013-04-11 LAB — LIPID PANEL
CHOL/HDL RATIO: 5
Cholesterol: 198 mg/dL (ref 0–200)
HDL: 40.3 mg/dL (ref >39.00)
LDL Cholesterol: 61 mg/dL (ref 0–99)
TRIGLYCERIDES: 485 mg/dL — AB (ref 0.0–149.0)
VLDL: 97 mg/dL — AB (ref 0.0–40.0)

## 2013-04-18 ENCOUNTER — Ambulatory Visit (INDEPENDENT_AMBULATORY_CARE_PROVIDER_SITE_OTHER): Payer: BC Managed Care – PPO | Admitting: Family Medicine

## 2013-04-18 ENCOUNTER — Encounter: Payer: Self-pay | Admitting: Family Medicine

## 2013-04-18 VITALS — BP 144/90 | HR 96 | Temp 98.6°F | Ht 68.0 in | Wt 216.5 lb

## 2013-04-18 DIAGNOSIS — E785 Hyperlipidemia, unspecified: Secondary | ICD-10-CM

## 2013-04-18 DIAGNOSIS — I10 Essential (primary) hypertension: Secondary | ICD-10-CM

## 2013-04-18 DIAGNOSIS — E119 Type 2 diabetes mellitus without complications: Secondary | ICD-10-CM

## 2013-04-18 MED ORDER — PIOGLITAZONE HCL 15 MG PO TABS: 15.0000 mg | ORAL_TABLET | Freq: Every day | ORAL | Status: DC

## 2013-04-18 NOTE — Progress Notes (Signed)
58 year old male with CAD presents for 3 month follow up.   Hypertension:Above goal today on metoprolol and altace, but well controlled at home. BP Readings from Last 3 Encounters:  04/18/13 144/90  01/14/13 130/84  11/21/12 120/80  Using medication without problems or lightheadedness: none  Chest pain with exertion:None  Edema:None  Short of breath:None  Average home BPs: 120/80  Other issues:  Wt Readings from Last 3 Encounters:  04/18/13 216 lb 8 oz (98.204 kg)  01/14/13 212 lb 12 oz (96.503 kg)  11/21/12 216 lb 8 oz (98.204 kg)    Diabetes: inadequately controlled on metformin XR max, glimperide 2 tabs a day max.  Lab Results  Component Value Date   HGBA1C 8.7* 04/11/2013  Using medications without difficulties:  Hypoglycemic episodes: none  Hyperglycemic episodes:occ  Feet problems: No ulcers. Blood Sugars averaging: FBS 200-220   Has seen a nutritionist.  He has been exercising every day for 45 min daily. He is working aggressively on diet. Stopping oatmeal., Eating more salads.  Minimal exercise.   Elevated Cholesterol: LDL now at goal < 70 on 40 mg simvastatin daily Trig still very high.  Lab Results  Component Value Date   CHOL 198 04/11/2013   HDL 40.30 04/11/2013   LDLCALC 61 04/11/2013   LDLDIRECT 52.1 12/23/2012   TRIG 485.0* 04/11/2013   CHOLHDL 5 04/11/2013   Using medications without problems: None    He feels that when he stops levothyroxine... His chol and sugar numbers are better.  He still has part of gland s/p thyroidectomy for benign Issues.  In last 3 months he has stopped his levothyroxine.  Lab Results  Component Value Date   TSH 2.05 04/11/2013    Thyroid now inadequately controlled, and chol and DM not any better.  He has been tired lately.   Review of Systems  Constitutional: Negative for fever and fatigue.  HENT: Negative for ear pain.  Eyes: Negative for pain.  Respiratory: Negative for cough and shortness of breath.   Cardiovascular: Negative for chest pain, palpitations and leg swelling.  Gastrointestinal: Negative for abdominal pain.  Objective:   Physical Exam  Constitutional: He is oriented to person, place, and time. Vital signs are normal. He appears well-developed and well-nourished.  HENT:  Head: Normocephalic.  Right Ear: Hearing normal.  Left Ear: Hearing normal.  Nose: Nose normal.  Mouth/Throat: Oropharynx is clear and moist and mucous membranes are normal.  Neck: Trachea normal. Carotid bruit is not present. No mass and no thyromegaly present.  Cardiovascular: Normal rate, regular rhythm and normal pulses. Exam reveals no gallop, no distant heart sounds and no friction rub.  No murmur heard. No peripheral edema  Pulmonary/Chest: Effort normal and breath sounds normal. No respiratory distress.  Abdominal: Soft. There is no tenderness.  Neurological: He is alert and oriented to person, place, and time.  Skin: Skin is warm, dry and intact. No rash noted.  Psychiatric: He has a normal mood and affect. His speech is normal and behavior is normal. Thought content normal.    Diabetic foot exam:  Normal inspection  No skin breakdown  No calluses  Normal DP pulses  Normal sensation to light touch and monofilament  Nails normal

## 2013-04-18 NOTE — Progress Notes (Signed)
Pre visit review using our clinic review tool, if applicable. No additional management support is needed unless otherwise documented below in the visit note. 

## 2013-04-18 NOTE — Assessment & Plan Note (Signed)
Poor control. Pt will INcrease exercise. Open to trying low dose of actos ( he thinks  he has been on this before)  Encouraged exercise, weight loss, healthy eating habits.

## 2013-04-18 NOTE — Assessment & Plan Note (Signed)
Previously well controlled. Follow.

## 2013-04-18 NOTE — Assessment & Plan Note (Signed)
LDL at goal, trig still elevated. Likely will imrove with improvement in sugar.

## 2013-04-18 NOTE — Patient Instructions (Addendum)
Start actos 15 mg daily. Ask insurance about metformin and actos combination... Cost. Increase exercise,  pay attention to  Carb low diet. Follow up 3 months DM.   Hypertriglyceridemia  Diet for High blood levels of Triglycerides Most fats in food are triglycerides. Triglycerides in your blood are stored as fat in your body. High levels of triglycerides in your blood may put you at a greater risk for heart disease and stroke.  Normal triglyceride levels are less than 150 mg/dL. Borderline high levels are 150-199 mg/dl. High levels are 200 - 499 mg/dL, and very high triglyceride levels are greater than 500 mg/dL. The decision to treat high triglycerides is generally based on the level. For people with borderline or high triglyceride levels, treatment includes weight loss and exercise. Drugs are recommended for people with very high triglyceride levels. Many people who need treatment for high triglyceride levels have metabolic syndrome. This syndrome is a collection of disorders that often include: insulin resistance, high blood pressure, blood clotting problems, high cholesterol and triglycerides. TESTING PROCEDURE FOR TRIGLYCERIDES  You should not eat 4 hours before getting your triglycerides measured. The normal range of triglycerides is between 10 and 250 milligrams per deciliter (mg/dl). Some people may have extreme levels (1000 or above), but your triglyceride level may be too high if it is above 150 mg/dl, depending on what other risk factors you have for heart disease.  People with high blood triglycerides may also have high blood cholesterol levels. If you have high blood cholesterol as well as high blood triglycerides, your risk for heart disease is probably greater than if you only had high triglycerides. High blood cholesterol is one of the main risk factors for heart disease. CHANGING YOUR DIET  Your weight can affect your blood triglyceride level. If you are more than 20% above your  ideal body weight, you may be able to lower your blood triglycerides by losing weight. Eating less and exercising regularly is the best way to combat this. Fat provides more calories than any other food. The best way to lose weight is to eat less fat. Only 30% of your total calories should come from fat. Less than 7% of your diet should come from saturated fat. A diet low in fat and saturated fat is the same as a diet to decrease blood cholesterol. By eating a diet lower in fat, you may lose weight, lower your blood cholesterol, and lower your blood triglyceride level.  Eating a diet low in fat, especially saturated fat, may also help you lower your blood triglyceride level. Ask your dietitian to help you figure how much fat you can eat based on the number of calories your caregiver has prescribed for you.  Exercise, in addition to helping with weight loss may also help lower triglyceride levels.   Alcohol can increase blood triglycerides. You may need to stop drinking alcoholic beverages.  Too much carbohydrate in your diet may also increase your blood triglycerides. Some complex carbohydrates are necessary in your diet. These may include bread, rice, potatoes, other starchy vegetables and cereals.  Reduce "simple" carbohydrates. These may include pure sugars, candy, honey, and jelly without losing other nutrients. If you have the kind of high blood triglycerides that is affected by the amount of carbohydrates in your diet, you will need to eat less sugar and less high-sugar foods. Your caregiver can help you with this.  Adding 2-4 grams of fish oil (EPA+ DHA) may also help lower triglycerides. Speak with your caregiver  before adding any supplements to your regimen. Following the Diet  Maintain your ideal weight. Your caregivers can help you with a diet. Generally, eating less food and getting more exercise will help you lose weight. Joining a weight control group may also help. Ask your caregivers for  a good weight control group in your area.  Eat low-fat foods instead of high-fat foods. This can help you lose weight too.  These foods are lower in fat. Eat MORE of these:   Dried beans, peas, and lentils.  Egg whites.  Low-fat cottage cheese.  Fish.  Lean cuts of meat, such as round, sirloin, rump, and flank (cut extra fat off meat you fix).  Whole grain breads, cereals and pasta.  Skim and nonfat dry milk.  Low-fat yogurt.  Poultry without the skin.  Cheese made with skim or part-skim milk, such as mozzarella, parmesan, farmers', ricotta, or pot cheese. These are higher fat foods. Eat LESS of these:   Whole milk and foods made from whole milk, such as American, blue, cheddar, monterey jack, and swiss cheese  High-fat meats, such as luncheon meats, sausages, knockwurst, bratwurst, hot dogs, ribs, corned beef, ground pork, and regular ground beef.  Fried foods. Limit saturated fats in your diet. Substituting unsaturated fat for saturated fat may decrease your blood triglyceride level. You will need to read package labels to know which products contain saturated fats.  These foods are high in saturated fat. Eat LESS of these:   Fried pork skins.  Whole milk.  Skin and fat from poultry.  Palm oil.  Butter.  Shortening.  Cream cheese.  Berniece Salines.  Margarines and baked goods made from listed oils.  Vegetable shortenings.  Chitterlings.  Fat from meats.  Coconut oil.  Palm kernel oil.  Lard.  Cream.  Sour cream.  Fatback.  Coffee whiteners and non-dairy creamers made with these oils.  Cheese made from whole milk. Use unsaturated fats (both polyunsaturated and monounsaturated) moderately. Remember, even though unsaturated fats are better than saturated fats; you still want a diet low in total fat.  These foods are high in unsaturated fat:   Canola oil.  Sunflower oil.  Mayonnaise.  Almonds.  Peanuts.  Pine nuts.  Margarines made with these  oils.  Safflower oil.  Olive oil.  Avocados.  Cashews.  Peanut butter.  Sunflower seeds.  Soybean oil.  Peanut oil.  Olives.  Pecans.  Walnuts.  Pumpkin seeds. Avoid sugar and other high-sugar foods. This will decrease carbohydrates without decreasing other nutrients. Sugar in your food goes rapidly to your blood. When there is excess sugar in your blood, your liver may use it to make more triglycerides. Sugar also contains calories without other important nutrients.  Eat LESS of these:   Sugar, brown sugar, powdered sugar, jam, jelly, preserves, honey, syrup, molasses, pies, candy, cakes, cookies, frosting, pastries, colas, soft drinks, punches, fruit drinks, and regular gelatin.  Avoid alcohol. Alcohol, even more than sugar, may increase blood triglycerides. In addition, alcohol is high in calories and low in nutrients. Ask for sparkling water, or a diet soft drink instead of an alcoholic beverage. Suggestions for planning and preparing meals   Bake, broil, grill or roast meats instead of frying.  Remove fat from meats and skin from poultry before cooking.  Add spices, herbs, lemon juice or vinegar to vegetables instead of salt, rich sauces or gravies.  Use a non-stick skillet without fat or use no-stick sprays.  Cool and refrigerate stews and broth. Then  remove the hardened fat floating on the surface before serving.  Refrigerate meat drippings and skim off fat to make low-fat gravies.  Serve more fish.  Use less butter, margarine and other high-fat spreads on bread or vegetables.  Use skim or reconstituted non-fat dry milk for cooking.  Cook with low-fat cheeses.  Substitute low-fat yogurt or cottage cheese for all or part of the sour cream in recipes for sauces, dips or congealed salads.  Use half yogurt/half mayonnaise in salad recipes.  Substitute evaporated skim milk for cream. Evaporated skim milk or reconstituted non-fat dry milk can be whipped and  substituted for whipped cream in certain recipes.  Choose fresh fruits for dessert instead of high-fat foods such as pies or cakes. Fruits are naturally low in fat. When Dining Out   Order low-fat appetizers such as fruit or vegetable juice, pasta with vegetables or tomato sauce.  Select clear, rather than cream soups.  Ask that dressings and gravies be served on the side. Then use less of them.  Order foods that are baked, broiled, poached, steamed, stir-fried, or roasted.  Ask for margarine instead of butter, and use only a small amount.  Drink sparkling water, unsweetened tea or coffee, or diet soft drinks instead of alcohol or other sweet beverages. QUESTIONS AND ANSWERS ABOUT OTHER FATS IN THE BLOOD: SATURATED FAT, TRANS FAT, AND CHOLESTEROL What is trans fat? Trans fat is a type of fat that is formed when vegetable oil is hardened through a process called hydrogenation. This process helps makes foods more solid, gives them shape, and prolongs their shelf life. Trans fats are also called hydrogenated or partially hydrogenated oils.  What do saturated fat, trans fat, and cholesterol in foods have to do with heart disease? Saturated fat, trans fat, and cholesterol in the diet all raise the level of LDL "bad" cholesterol in the blood. The higher the LDL cholesterol, the greater the risk for coronary heart disease (CHD). Saturated fat and trans fat raise LDL similarly.  What foods contain saturated fat, trans fat, and cholesterol? High amounts of saturated fat are found in animal products, such as fatty cuts of meat, chicken skin, and full-fat dairy products like butter, whole milk, cream, and cheese, and in tropical vegetable oils such as palm, palm kernel, and coconut oil. Trans fat is found in some of the same foods as saturated fat, such as vegetable shortening, some margarines (especially hard or stick margarine), crackers, cookies, baked goods, fried foods, salad dressings, and other  processed foods made with partially hydrogenated vegetable oils. Small amounts of trans fat also occur naturally in some animal products, such as milk products, beef, and lamb. Foods high in cholesterol include liver, other organ meats, egg yolks, shrimp, and full-fat dairy products. How can I use the new food label to make heart-healthy food choices? Check the Nutrition Facts panel of the food label. Choose foods lower in saturated fat, trans fat, and cholesterol. For saturated fat and cholesterol, you can also use the Percent Daily Value (%DV): 5% DV or less is low, and 20% DV or more is high. (There is no %DV for trans fat.) Use the Nutrition Facts panel to choose foods low in saturated fat and cholesterol, and if the trans fat is not listed, read the ingredients and limit products that list shortening or hydrogenated or partially hydrogenated vegetable oil, which tend to be high in trans fat. POINTS TO REMEMBER:   Discuss your risk for heart disease with your caregivers, and  take steps to reduce risk factors.  Change your diet. Choose foods that are low in saturated fat, trans fat, and cholesterol.  Add exercise to your daily routine if it is not already being done. Participate in physical activity of moderate intensity, like brisk walking, for at least 30 minutes on most, and preferably all days of the week. No time? Break the 30 minutes into three, 10-minute segments during the day.  Stop smoking. If you do smoke, contact your caregiver to discuss ways in which they can help you quit.  Do not use street drugs.  Maintain a normal weight.  Maintain a healthy blood pressure.  Keep up with your blood work for checking the fats in your blood as directed by your caregiver. Document Released: 11/04/2003 Document Revised: 07/18/2011 Document Reviewed: 06/01/2008 [facility] Patient Information 2014 [facility].

## 2013-04-21 ENCOUNTER — Telehealth: Payer: Self-pay | Admitting: Family Medicine

## 2013-04-21 NOTE — Telephone Encounter (Signed)
Relevant patient education assigned to patient using Emmi. ° °

## 2013-04-29 ENCOUNTER — Telehealth: Payer: Self-pay

## 2013-04-29 NOTE — Telephone Encounter (Signed)
Relevant patient education assigned to patient using Emmi. ° °

## 2013-05-12 ENCOUNTER — Other Ambulatory Visit: Payer: Self-pay | Admitting: Family Medicine

## 2013-05-12 MED ORDER — LEVOTHYROXINE SODIUM 100 MCG PO TABS: 100.0000 ug | ORAL_TABLET | Freq: Every day | ORAL | Status: DC

## 2013-05-20 ENCOUNTER — Encounter: Payer: Self-pay | Admitting: Cardiovascular Disease

## 2013-05-20 ENCOUNTER — Ambulatory Visit (INDEPENDENT_AMBULATORY_CARE_PROVIDER_SITE_OTHER): Payer: BC Managed Care – PPO | Admitting: Cardiovascular Disease

## 2013-05-20 VITALS — BP 130/80 | HR 95 | Ht 68.0 in | Wt 219.8 lb

## 2013-05-20 DIAGNOSIS — I2510 Atherosclerotic heart disease of native coronary artery without angina pectoris: Secondary | ICD-10-CM

## 2013-05-20 DIAGNOSIS — I10 Essential (primary) hypertension: Secondary | ICD-10-CM

## 2013-05-20 DIAGNOSIS — E785 Hyperlipidemia, unspecified: Secondary | ICD-10-CM

## 2013-05-20 DIAGNOSIS — E119 Type 2 diabetes mellitus without complications: Secondary | ICD-10-CM

## 2013-05-20 MED ORDER — EZETIMIBE 10 MG PO TABS
10.0000 mg | ORAL_TABLET | Freq: Every day | ORAL | Status: DC
Start: 2013-05-20 — End: 2013-08-08

## 2013-05-20 NOTE — Assessment & Plan Note (Signed)
Currently with no symptoms of angina. No further workup at this time. Hemoglobin A1c above goal, cholesterol is high. We will start zetia 10 mg daily

## 2013-05-20 NOTE — Assessment & Plan Note (Addendum)
We spent some time talking about his hemoglobin A1c which is elevated. We have encouraged continued exercise, careful diet management in an effort to lose weight.

## 2013-05-20 NOTE — Progress Notes (Signed)
Patient ID: Juan [name], male    DOB: [birth year], 58 y.o.   MRN: [id number]  HPI Comments: Mr. Schmieder is a very pleasant 58 year old male with a history of premature coronary artery disease, multiple cardiac catheterizations with intervention in the past dating back to 1994, last catheterization in 2006 by his report in [place], total of 8 stents per the patient who presents for routine followup. History of diabetes and hyperlipidemia.  Overall he reports that he has been doing well. He is trying to watch his diet,  Continues to work long hours. His hemoglobin A1c has been elevated,  8.7 Cholesterol numbers above goal He denies any significant chest pain or shortness of breath concerning for angina. No smoking history. Reports his father also had coronary artery disease in his 32s. He has had problems in the past with Crestor and Lipitor  EKG shows normal sinus rhythm with rate 96 beats per minute with no significant ST or T wave changes.      Outpatient Encounter Prescriptions as of 05/20/2013  Medication Sig  . amLODipine (NORVASC) 5 MG tablet Take 1 tablet (5 mg total) by mouth daily.  Marland Kitchen aspirin 81 MG tablet Take 81 mg by mouth daily.  . B Complex Vitamins (VITAMIN B COMPLEX PO) Take by mouth daily.  . clopidogrel (PLAVIX) 75 MG tablet Take 1 tablet (75 mg total) by mouth daily.  . folic acid (FOLVITE) 671 MCG tablet Take 400 mcg by mouth daily.  Marland Kitchen glimepiride (AMARYL) 4 MG tablet Take 2 tablets (8 mg total) by mouth daily before breakfast.  . levothyroxine (SYNTHROID) 100 MCG tablet Take 1 tablet (100 mcg total) by mouth daily before breakfast.  . metFORMIN (GLUCOPHAGE-XR) 500 MG 24 hr tablet Take 2 tablets (1,000 mg total) by mouth 2 (two) times daily.  . metoprolol tartrate (LOPRESSOR) 25 MG tablet Take 1 tablet (25 mg total) by mouth 2 (two) times daily.  . Multiple Vitamin (MULTIVITAMIN) tablet Take 1 tablet by mouth daily.  . nitroGLYCERIN (NITROSTAT) 0.4 MG SL tablet  Place 0.4 mg under the tongue every 5 (five) minutes as needed for chest pain.  . Omega-3 Fatty Acids (FISH OIL PO) Take by mouth 2 (two) times daily.   . pioglitazone (ACTOS) 15 MG tablet Take 1 tablet (15 mg total) by mouth daily.  . ramipril (ALTACE) 5 MG capsule Take 1 capsule (5 mg total) by mouth daily.  . simvastatin (ZOCOR) 40 MG tablet Take 1 tablet (40 mg total) by mouth daily.    Review of Systems  Constitutional: Negative.   HENT: Negative.   Eyes: Negative.   Respiratory: Negative.   Cardiovascular: Negative.   Gastrointestinal: Negative.   Endocrine: Negative.   Musculoskeletal: Negative.   Skin: Negative.   Allergic/Immunologic: Negative.   Neurological: Negative.   Hematological: Negative.   Psychiatric/Behavioral: Negative.   All other systems reviewed and are negative.   BP 130/80  Pulse 95  Ht 5\' 8"  (1.727 m)  Wt 219 lb 12 oz (99.678 kg)  BMI 33.42 kg/m2  Physical Exam  Nursing note and vitals reviewed. Constitutional: He is oriented to person, place, and time. He appears well-developed and well-nourished.  HENT:  Head: Normocephalic.  Nose: Nose normal.  Mouth/Throat: Oropharynx is clear and moist.  Eyes: Conjunctivae are normal. Pupils are equal, round, and reactive to light.  Neck: Normal range of motion. Neck supple. No JVD present.  Cardiovascular: Normal rate, regular rhythm, S1 normal, S2 normal and intact distal pulses.  Exam reveals  no gallop and no friction rub.   Murmur heard.  Systolic murmur is present with a grade of 2/6  Pulmonary/Chest: Effort normal and breath sounds normal. No respiratory distress. He has no wheezes. He has no rales. He exhibits no tenderness.  Abdominal: Soft. Bowel sounds are normal. He exhibits no distension. There is no tenderness.  Musculoskeletal: Normal range of motion. He exhibits no edema and no tenderness.  Lymphadenopathy:    He has no cervical adenopathy.  Neurological: He is alert and oriented to person,  place, and time. Coordination normal.  Skin: Skin is warm and dry. No rash noted. No erythema.  Psychiatric: He has a normal mood and affect. His behavior is normal. Judgment and thought content normal.      Assessment and Plan

## 2013-05-20 NOTE — Patient Instructions (Signed)
You are doing well. Please start zetia one a day Continue simvatstatin  Please call us if you have new issues that need to be addressed before your next appt.  Your physician wants you to follow-up in: 6 months.  You will receive a reminder letter in the mail two months in advance. If you don't receive a letter, please call our office to schedule the follow-up appointment.

## 2013-05-20 NOTE — Assessment & Plan Note (Signed)
Blood pressure is well controlled on today's visit. No changes made to the medications. 

## 2013-05-20 NOTE — Assessment & Plan Note (Signed)
Recommended he continue on his statin. We will add zetia 10 mg daily

## 2013-05-31 ENCOUNTER — Other Ambulatory Visit: Payer: Self-pay | Admitting: Cardiovascular Disease

## 2013-07-18 ENCOUNTER — Other Ambulatory Visit: Payer: BC Managed Care – PPO

## 2013-07-18 ENCOUNTER — Telehealth: Payer: Self-pay | Admitting: Family Medicine

## 2013-07-18 DIAGNOSIS — E785 Hyperlipidemia, unspecified: Secondary | ICD-10-CM

## 2013-07-18 DIAGNOSIS — E119 Type 2 diabetes mellitus without complications: Secondary | ICD-10-CM

## 2013-07-18 NOTE — Telephone Encounter (Signed)
Message copied by Jinny Sanders on Fri Jul 18, 2013  2:09 PM ------      Message from: Ellamae Sia      Created: Fri Jul 11, 2013  5:23 PM      Regarding: lab orders for Friday, 6.19.15       Lab orders, no f/u appt ------

## 2013-07-22 ENCOUNTER — Other Ambulatory Visit: Payer: Self-pay | Admitting: *Deleted

## 2013-07-22 MED ORDER — LEVOTHYROXINE SODIUM 100 MCG PO TABS: 100.0000 ug | ORAL_TABLET | Freq: Every day | ORAL | Status: DC

## 2013-07-28 ENCOUNTER — Other Ambulatory Visit (INDEPENDENT_AMBULATORY_CARE_PROVIDER_SITE_OTHER): Payer: BC Managed Care – PPO

## 2013-07-28 DIAGNOSIS — E119 Type 2 diabetes mellitus without complications: Secondary | ICD-10-CM

## 2013-07-28 LAB — HEMOGLOBIN A1C: HEMOGLOBIN A1C: 8.1 % — AB (ref 4.6–6.5)

## 2013-08-05 ENCOUNTER — Other Ambulatory Visit: Payer: Self-pay | Admitting: Cardiovascular Disease

## 2013-08-08 ENCOUNTER — Ambulatory Visit (INDEPENDENT_AMBULATORY_CARE_PROVIDER_SITE_OTHER): Payer: BC Managed Care – PPO | Admitting: Family Medicine

## 2013-08-08 ENCOUNTER — Encounter: Payer: Self-pay | Admitting: Family Medicine

## 2013-08-08 VITALS — BP 120/74 | HR 81 | Temp 98.4°F | Ht 68.0 in | Wt 219.8 lb

## 2013-08-08 DIAGNOSIS — H01136 Eczematous dermatitis of left eye, unspecified eyelid: Secondary | ICD-10-CM

## 2013-08-08 DIAGNOSIS — E119 Type 2 diabetes mellitus without complications: Secondary | ICD-10-CM

## 2013-08-08 DIAGNOSIS — E785 Hyperlipidemia, unspecified: Secondary | ICD-10-CM

## 2013-08-08 DIAGNOSIS — I10 Essential (primary) hypertension: Secondary | ICD-10-CM

## 2013-08-08 DIAGNOSIS — H01139 Eczematous dermatitis of unspecified eye, unspecified eyelid: Secondary | ICD-10-CM

## 2013-08-08 LAB — HM DIABETES FOOT EXAM

## 2013-08-08 NOTE — Patient Instructions (Addendum)
Omega three fatty acid:  DHA and EPA: 2000 mg divided daily. Look into victoza vs lantus (insulin pens). Work on exercise and weight loss. Decrease metfromin to 3 tabs a day to see if stool improvement.  Can use OTC hydrocortisone cream on eyelid twice daily, call if not improving.  Follow up in 3 months for CPX with labs prior.

## 2013-08-08 NOTE — Assessment & Plan Note (Addendum)
LDL at goal but trigs still high. Need to get DM better controlled.  Zetia too expensive.

## 2013-08-08 NOTE — Assessment & Plan Note (Signed)
Well controlled. Continue current medication.  

## 2013-08-08 NOTE — Progress Notes (Signed)
Pre visit review using our clinic review tool, if applicable. No additional management support is needed unless otherwise documented below in the visit note. 

## 2013-08-08 NOTE — Assessment & Plan Note (Signed)
Poor control but improving with exercise.  Decrease metformin given diarrhea. He will look into lantus pr victoza.  Follow up in 3 months.

## 2013-08-08 NOTE — Progress Notes (Signed)
58 year old male with CAD presents for 3 month follow up.   Hypertension:At goal today on metoprolol and altace. BP Readings from Last 3 Encounters:  08/08/13 120/74  05/20/13 130/80  04/18/13 144/90  sing medication without problems or lightheadedness: none  Chest pain with exertion:None  Edema:None  Short of breath:None  Average home BPs: 120/80  Other issues:  Wt Readings from Last 3 Encounters:  08/08/13 219 lb 12 oz (99.678 kg)  05/20/13 219 lb 12 oz (99.678 kg)  04/18/13 216 lb 8 oz (98.204 kg)    Diabetes: inadequately controlled on metformin XR max, glimperide 2 tabs a day max.   Lab Results  Component Value Date   HGBA1C 8.1* 07/28/2013  Using medications without difficulties:  Hypoglycemic episodes: none  Hyperglycemic episodes:occ  Feet problems: No ulcers.  Blood Sugars averaging: FBS 150 Has seen a nutritionist.  He has been exercising every day for 45 min daily.  He is working aggressively on diet. Stopping oatmeal., Eating more salads.    He has improved exercise habits. Daily now outside.  He has noted abdominal cramps and loose stools after eating anything. Ongoing for  a while. He cut out evening dose of metformin and stool issues improved dramatically.   Review of Systems  Constitutional: Negative for fever and fatigue.  HENT: Negative for ear pain.  Eyes: Negative for pain.  Respiratory: Negative for cough and shortness of breath.  Cardiovascular: Negative for chest pain, palpitations and leg swelling.  Gastrointestinal: Negative for abdominal pain.  Objective:   Physical Exam  Constitutional: He is oriented to person, place, and time. Vital signs are normal. He appears well-developed and well-nourished.  HENT:  Head: Normocephalic.  Right Ear: Hearing normal.  Left Ear: Hearing normal.  Nose: Nose normal.  Mouth/Throat: Oropharynx is clear and moist and mucous membranes are normal.  Neck: Trachea normal. Carotid bruit is not present. No mass  and no thyromegaly present.  Cardiovascular: Normal rate, regular rhythm and normal pulses. Exam reveals no gallop, no distant heart sounds and no friction rub.  No murmur heard. No peripheral edema  Pulmonary/Chest: Effort normal and breath sounds normal. No respiratory distress.  Abdominal: Soft. There is no tenderness.  Neurological: He is alert and oriented to person, place, and time.  Skin: Skin is warm, dry and intact. No rash noted.  Psychiatric: He has a normal mood and affect. His speech is normal and behavior is normal. Thought content normal.    Diabetic foot exam:  Normal inspection  No skin breakdown  No calluses  Normal DP pulses  Normal sensation to light touch and monofilament  Nails normal

## 2013-08-08 NOTE — Assessment & Plan Note (Signed)
Treat with OTC hydrocortisone cream BID.

## 2013-08-25 ENCOUNTER — Other Ambulatory Visit: Payer: Self-pay | Admitting: Family Medicine

## 2013-08-25 ENCOUNTER — Other Ambulatory Visit: Payer: Self-pay | Admitting: Cardiovascular Disease

## 2013-08-26 ENCOUNTER — Other Ambulatory Visit: Payer: Self-pay | Admitting: *Deleted

## 2013-08-26 ENCOUNTER — Other Ambulatory Visit: Payer: Self-pay | Admitting: Family Medicine

## 2013-08-26 MED ORDER — AMLODIPINE BESYLATE 5 MG PO TABS: 5.0000 mg | ORAL_TABLET | Freq: Every day | ORAL | Status: DC

## 2013-08-26 NOTE — Telephone Encounter (Signed)
Requested Prescriptions   Signed Prescriptions Disp Refills  . amLODipine (NORVASC) 5 MG tablet 90 tablet 3    Sig: Take 1 tablet (5 mg total) by mouth daily.    Authorizing Provider: GOLLAN, TIMOTHY J    Ordering User: LOPEZ, MARINA C    

## 2013-11-07 ENCOUNTER — Other Ambulatory Visit: Payer: BC Managed Care – PPO

## 2013-11-07 ENCOUNTER — Telehealth: Payer: Self-pay | Admitting: Family Medicine

## 2013-11-07 DIAGNOSIS — E119 Type 2 diabetes mellitus without complications: Secondary | ICD-10-CM

## 2013-11-07 NOTE — Telephone Encounter (Signed)
Message copied by Jinny Sanders on Fri Nov 07, 2013  8:23 AM ------      Message from: Ellamae Sia      Created: Thu Oct 30, 2013  4:49 PM      Regarding: Lab orders for Friday, 10.9.15       Patient is scheduled for CPX labs, please order future labs, Thanks , Terri       ------

## 2013-11-10 ENCOUNTER — Other Ambulatory Visit: Payer: Self-pay | Admitting: Cardiovascular Disease

## 2013-11-10 ENCOUNTER — Other Ambulatory Visit (INDEPENDENT_AMBULATORY_CARE_PROVIDER_SITE_OTHER): Payer: BC Managed Care – PPO

## 2013-11-10 DIAGNOSIS — E119 Type 2 diabetes mellitus without complications: Secondary | ICD-10-CM

## 2013-11-10 DIAGNOSIS — E785 Hyperlipidemia, unspecified: Secondary | ICD-10-CM

## 2013-11-10 DIAGNOSIS — I10 Essential (primary) hypertension: Secondary | ICD-10-CM

## 2013-11-10 LAB — LIPID PANEL
CHOL/HDL RATIO: 8
Cholesterol: 210 mg/dL — ABNORMAL HIGH (ref 0–200)
HDL: 27.3 mg/dL — ABNORMAL LOW (ref >39.00)
NonHDL: 182.7
TRIGLYCERIDES: 546 mg/dL — AB (ref 0.0–149.0)
VLDL: 109.2 mg/dL — ABNORMAL HIGH (ref 0.0–40.0)

## 2013-11-10 LAB — COMPREHENSIVE METABOLIC PANEL
ALK PHOS: 60 U/L (ref 39–117)
ALT: 36 U/L (ref 0–53)
AST: 37 U/L (ref 0–37)
Albumin: 3.6 g/dL (ref 3.5–5.2)
BUN: 17 mg/dL (ref 6–23)
CO2: 27 mEq/L (ref 19–32)
Calcium: 9.1 mg/dL (ref 8.4–10.5)
Chloride: 102 mEq/L (ref 96–112)
Creatinine, Ser: 0.9 mg/dL (ref 0.4–1.5)
GFR: 93.55 mL/min (ref >60.00)
GLUCOSE: 239 mg/dL — AB (ref 70–99)
Potassium: 4.1 mEq/L (ref 3.5–5.1)
Sodium: 137 mEq/L (ref 135–145)
Total Bilirubin: 0.9 mg/dL (ref 0.2–1.2)
Total Protein: 7.7 g/dL (ref 6.0–8.3)

## 2013-11-10 LAB — HEMOGLOBIN A1C: HEMOGLOBIN A1C: 8.1 % — AB (ref 4.6–6.5)

## 2013-11-10 LAB — LDL CHOLESTEROL, DIRECT: Direct LDL: 74 mg/dL

## 2013-11-14 ENCOUNTER — Encounter: Payer: Self-pay | Admitting: Family Medicine

## 2013-11-14 ENCOUNTER — Ambulatory Visit (INDEPENDENT_AMBULATORY_CARE_PROVIDER_SITE_OTHER): Payer: BC Managed Care – PPO | Admitting: Family Medicine

## 2013-11-14 VITALS — BP 112/70 | HR 92 | Temp 98.3°F | Ht 67.0 in | Wt 223.2 lb

## 2013-11-14 DIAGNOSIS — I10 Essential (primary) hypertension: Secondary | ICD-10-CM

## 2013-11-14 DIAGNOSIS — Z0000 Encounter for general adult medical examination without abnormal findings: Secondary | ICD-10-CM

## 2013-11-14 DIAGNOSIS — E119 Type 2 diabetes mellitus without complications: Secondary | ICD-10-CM

## 2013-11-14 DIAGNOSIS — E785 Hyperlipidemia, unspecified: Secondary | ICD-10-CM

## 2013-11-14 DIAGNOSIS — Z125 Encounter for screening for malignant neoplasm of prostate: Secondary | ICD-10-CM

## 2013-11-14 MED ORDER — NITROGLYCERIN 0.4 MG SL SUBL: 0.4000 mg | SUBLINGUAL_TABLET | SUBLINGUAL | Status: DC | PRN

## 2013-11-14 MED ORDER — PIOGLITAZONE HCL 30 MG PO TABS: 30.0000 mg | ORAL_TABLET | Freq: Every day | ORAL | Status: DC

## 2013-11-14 NOTE — Assessment & Plan Note (Signed)
Encouraged exercise, weight loss, healthy eating habits. ? ?

## 2013-11-14 NOTE — Assessment & Plan Note (Signed)
Poor control. Increase actos to 30 then 45 mg daily. Encouraged exercise, weight loss, healthy eating habits.

## 2013-11-14 NOTE — Progress Notes (Signed)
Juan [name] is a very pleasant 58 year old with a history of premature coronary artery disease, multiple cardiac catheterizations with intervention in the past dating back to 1994, last catheterization in 2006 by his report in [place], total of 8 stents per the patient who presents for annual appt.   CAD followed by Dr. Rockey Situ  Reviewed OV 04/2013 Started on plavix.  Needs refill of nitro. Has follow up in 1 month.  Hypertension:At goal today on metoprolol and altace BP Readings from Last 3 Encounters:  11/14/13 112/70  08/08/13 120/74  05/20/13 130/80  Using medication without problems or lightheadedness: none  Chest pain with exertion:one episode several months ago. Had slight momentary pain central chest, Not enough to take a nitro. No issue going up stairs at home. Edema:None  Short of breath:None  Average home BPs: 120/80  Other issues:  Wt Readings from Last 3 Encounters:  11/14/13 223 lb 4 oz (101.266 kg)  08/08/13 219 lb 12 oz (99.678 kg)  05/20/13 219 lb 12 oz (99.678 kg)    Diabetes: inadequately controlled on metformin XR lower, glimperide 2 tabs a day max.  Metformin at higher dose cause diarrhea.  Started on actos 15 mg last OV. Lab Results  Component Value Date   HGBA1C 8.1* 11/10/2013  Using medications without difficulties: None Hypoglycemic episodes: none  Hyperglycemic episodes:occ  Feet problems: No ulcers.  Blood Sugars averaging: FBS 150-200 Has seen a nutritionist. Not able to exercise as much as he hoped. He is working aggressively on diet. Stopping oatmeal., Eating more salads.  He has improved exercise habits. Daily now outside.   Elevated Cholesterol: LDL at goal < 70, trigs too high, HDL too low. Simvastatin 40 mg daily. Using medications without problems: Muscle aches:  Diet compliance: Exercise: Other complaints:   Review of Systems  Constitutional: Negative for fever, fatigue and unexpected weight change.  HENT: Negative for ear pain,  congestion, sore throat, rhinorrhea, trouble swallowing and postnasal drip.  Eyes: Negative for pain.  Respiratory: Negative for cough, shortness of breath and wheezing.  Cardiovascular: Negative for chest pain, palpitations and leg swelling.  Gastrointestinal: Negative for nausea, abdominal pain, diarrhea, constipation and blood in stool.  Genitourinary: Negative for dysuria, urgency, hematuria, discharge, penile swelling, scrotal swelling, difficulty urinating, penile pain and testicular pain.  Skin: Negative for rash.  Neurological: Negative for syncope, weakness, light-headedness, numbness and headaches.  Psychiatric/Behavioral: Negative for behavioral problems and dysphoric mood. The patient is not nervous/anxious.  Objective:   Physical Exam  Constitutional: Vital signs are normal. He appears well-developed and well-nourished.  overweight  HENT:  Head: Normocephalic.  Right Ear: Hearing normal.  Left Ear: Hearing normal.  Nose: Nose normal.  Mouth/Throat: Oropharynx is clear and moist and mucous membranes are normal.  Neck: Trachea normal. Carotid bruit is not present. No mass and no thyromegaly present.  Cardiovascular: Normal rate, regular rhythm and normal pulses. Exam reveals no gallop, no distant heart sounds and no friction rub.  No murmur heard. No peripheral edema  Pulmonary/Chest: Effort normal and breath sounds normal. No respiratory distress.  Skin: Skin is warm, dry and intact. No rash noted.  Psychiatric: He has a normal mood and affect. His speech is normal and behavior is normal. Thought content normal.   Diabetic foot exam:  Normal inspection  No skin breakdown  No calluses  Normal DP pulses  Normal sensation to light touch and monofilament  Nails normal  Assessment & Plan:   The patient's preventative maintenance and recommended screening  tests for an annual wellness exam were reviewed in full today.  Brought up to date unless services declined.  Counselled  on the importance of diet, exercise, and its role in overall health and mortality.  The patient's FH and SH was reviewed, including their home life, tobacco status, and drug and alcohol status.   Colonoscopy 12/2010: 1 polyp repeat in 5 years  Prostate; due for PSA   Lab Results  Component Value Date   PSA 0.92 05/23/2012  Vaccines: gets flu at work.  Uptodate with Td. No STD screen  Non smoker

## 2013-11-14 NOTE — Patient Instructions (Addendum)
Increase actos 30 mg daily. If not at goal after 2 weeks fasting blood sugars < 120, can increase to 45 mg daily. Check insurance about [place]. Call if interested. Continue simvastatin 40 mg daily.  Omega threes 4000 mg divided daily. Continue low chol diet.   Food Choices to Lower Your Triglycerides  Triglycerides are a type of fat in your blood. High levels of triglycerides can increase the risk of heart disease and stroke. If your triglyceride levels are high, the foods you eat and your eating habits are very important. Choosing the right foods can help lower your triglycerides.  WHAT GENERAL GUIDELINES DO I NEED TO FOLLOW?  Lose weight if you are overweight.   Limit or avoid alcohol.   Fill one half of your plate with vegetables and green salads.   Limit fruit to two servings a day. Choose fruit instead of juice.   Make one fourth of your plate whole grains. Look for the word "whole" as the first word in the ingredient list.  Fill one fourth of your plate with lean protein foods.  Enjoy fatty fish (such as salmon, mackerel, sardines, and tuna) three times a week.   Choose healthy fats.   Limit foods high in starch and sugar.  Eat more home-cooked food and less restaurant, buffet, and fast food.  Limit fried foods.  Cook foods using methods other than frying.  Limit saturated fats.  Check ingredient lists to avoid foods with partially hydrogenated oils (trans fats) in them. WHAT FOODS CAN I EAT?  Grains Whole grains, such as whole wheat or whole grain breads, crackers, cereals, and pasta. Unsweetened oatmeal, bulgur, barley, quinoa, or brown rice. Corn or whole wheat flour tortillas.  Vegetables Fresh or frozen vegetables (raw, steamed, roasted, or grilled). Green salads. Fruits All fresh, canned (in natural juice), or frozen fruits. Meat and Other Protein Products Ground beef (85% or leaner), grass-fed beef, or beef trimmed of fat. Skinless chicken or [place].  Ground chicken or [place]. Pork trimmed of fat. All fish and seafood. Eggs. Dried beans, peas, or lentils. Unsalted nuts or seeds. Unsalted canned or dry beans. Dairy Low-fat dairy products, such as skim or 1% milk, 2% or reduced-fat cheeses, low-fat ricotta or cottage cheese, or plain low-fat yogurt. Fats and Oils Tub margarines without trans fats. Light or reduced-fat mayonnaise and salad dressings. Avocado. Safflower, olive, or canola oils. Natural peanut or almond butter. The items listed above may not be a complete list of recommended foods or beverages. Contact your dietitian for more options. WHAT FOODS ARE NOT RECOMMENDED?  Grains White bread. White pasta. White rice. Cornbread. Bagels, pastries, and croissants. Crackers that contain trans fat. Vegetables White potatoes. Corn. Creamed or fried vegetables. Vegetables in a cheese sauce. Fruits Dried fruits. Canned fruit in light or heavy syrup. Fruit juice. Meat and Other Protein Products Fatty cuts of meat. Ribs, chicken wings, bacon, sausage, bologna, salami, chitterlings, fatback, hot dogs, bratwurst, and packaged luncheon meats. Dairy Whole or 2% milk, cream, half-and-half, and cream cheese. Whole-fat or sweetened yogurt. Full-fat cheeses. Nondairy creamers and whipped toppings. Processed cheese, cheese spreads, or cheese curds. Sweets and Desserts Corn syrup, sugars, honey, and molasses. Candy. Jam and jelly. Syrup. Sweetened cereals. Cookies, pies, cakes, donuts, muffins, and ice cream. Fats and Oils Butter, stick margarine, lard, shortening, ghee, or bacon fat. Coconut, palm kernel, or palm oils. Beverages Alcohol. Sweetened drinks (such as sodas, lemonade, and fruit drinks or punches). The items listed above may not be a complete  list of foods and beverages to avoid. Contact your dietitian for more information. Document Released: 11/04/2003 Document Revised: 01/21/2013 Document Reviewed: 11/20/2012 [facility] Patient Information  2015 [place], [place]. This information is not intended to replace advice given to you by your health care provider. Make sure you discuss any questions you have with your health care provider.

## 2013-11-14 NOTE — Assessment & Plan Note (Signed)
Trigs high . ON statin. Will first try to get  Better control with DM control. ON max omega 3s If not at goal next OV consider fenofibrate.

## 2013-11-14 NOTE — Progress Notes (Signed)
Pre visit review using our clinic review tool, if applicable. No additional management support is needed unless otherwise documented below in the visit note. 

## 2013-12-31 ENCOUNTER — Ambulatory Visit: Payer: BC Managed Care – PPO | Admitting: Cardiovascular Disease

## 2014-01-09 ENCOUNTER — Ambulatory Visit: Payer: BC Managed Care – PPO | Admitting: Cardiovascular Disease

## 2014-02-03 ENCOUNTER — Ambulatory Visit (INDEPENDENT_AMBULATORY_CARE_PROVIDER_SITE_OTHER): Payer: BLUE CROSS/BLUE SHIELD | Admitting: Cardiovascular Disease

## 2014-02-03 ENCOUNTER — Encounter: Payer: Self-pay | Admitting: Cardiovascular Disease

## 2014-02-03 VITALS — BP 130/70 | HR 89 | Ht 68.0 in | Wt 227.0 lb

## 2014-02-03 DIAGNOSIS — E785 Hyperlipidemia, unspecified: Secondary | ICD-10-CM

## 2014-02-03 DIAGNOSIS — IMO0002 Reserved for concepts with insufficient information to code with codable children: Secondary | ICD-10-CM | POA: Insufficient documentation

## 2014-02-03 DIAGNOSIS — E118 Type 2 diabetes mellitus with unspecified complications: Secondary | ICD-10-CM

## 2014-02-03 DIAGNOSIS — I10 Essential (primary) hypertension: Secondary | ICD-10-CM

## 2014-02-03 DIAGNOSIS — I2510 Atherosclerotic heart disease of native coronary artery without angina pectoris: Secondary | ICD-10-CM

## 2014-02-03 DIAGNOSIS — Z955 Presence of coronary angioplasty implant and graft: Secondary | ICD-10-CM

## 2014-02-03 DIAGNOSIS — E1165 Type 2 diabetes mellitus with hyperglycemia: Secondary | ICD-10-CM | POA: Insufficient documentation

## 2014-02-03 DIAGNOSIS — R0789 Other chest pain: Secondary | ICD-10-CM

## 2014-02-03 NOTE — Assessment & Plan Note (Signed)
Blood pressure is well controlled on today's visit. No changes made to the medications. 

## 2014-02-03 NOTE — Assessment & Plan Note (Signed)
Currently with no symptoms of angina. Recent episode of chest pain in August 2015. He is declining workup at this time and does not want stress test or catheterization.

## 2014-02-03 NOTE — Assessment & Plan Note (Signed)
Cholesterol well above goal. He does not want Lipitor or Crestor. He reports that he is unable to afford zetia and would prefer to put extra money into his diabetes regiment. Cholesterol should improve with better diabetes control

## 2014-02-03 NOTE — Progress Notes (Signed)
Patient ID: Juan [name], Juan    DOB: 09/10/1955, 59 y.o.   MRN: [id number]  HPI Comments: Juan [name] is a very pleasant 59 year old Juan with a history of premature coronary artery disease, multiple cardiac catheterizations with intervention in the past dating back to 1994, last catheterization in 2006 by his report in [place], total of 8 stents per the patient who presents for routine followup of his coronary artery disease. History of diabetes and hyperlipidemia.  In follow-up today, he reports having an episode of chest pain in August 2015. He was walking up a hill and developed chest tightness. Symptoms resolved at rest. He denies having any further episodes of chest pain since that time. He continues to have problems with his diabetes. Hemoglobin A1c always greater than 8 Total cholesterol is well above goal. He takes simvastatin 40 mg daily. He was unable to afford or tolerate Crestor and Lipitor. He does not want other medications for cholesterol at this time Unable to afford several of the new diabetes medications  EKG shows normal sinus rhythm with rate 89 bpm, no significant ST or T-wave changes  Other past medical history No smoking history. Reports his father also had coronary artery disease in his 12s.      Outpatient Encounter Prescriptions as of 02/03/2014  Medication Sig  . amLODipine (NORVASC) 5 MG tablet Take 1 tablet (5 mg total) by mouth daily.  Marland Kitchen aspirin 81 MG tablet Take 81 mg by mouth daily.  . B Complex Vitamins (VITAMIN B COMPLEX PO) Take by mouth daily.  . clopidogrel (PLAVIX) 75 MG tablet TAKE 1 BY MOUTH DAILY  . folic acid (FOLVITE) 841 MCG tablet Take 400 mcg by mouth daily.  Marland Kitchen glimepiride (AMARYL) 4 MG tablet TAKE 2 BY MOUTH DAILY BEFORE BREAKFAST  . levothyroxine (SYNTHROID) 100 MCG tablet Take 1 tablet (100 mcg total) by mouth daily before breakfast.  . metFORMIN (GLUCOPHAGE-XR) 500 MG 24 hr tablet Take one tablet by mouth in the morning and two  tablets by mouth in the evening  . metoprolol tartrate (LOPRESSOR) 25 MG tablet TAKE 1 BY MOUTH TWICE DAILY GENERIC FOR LOPRESSOR  . Multiple Vitamin (MULTIVITAMIN) tablet Take 1 tablet by mouth daily.  . nitroGLYCERIN (NITROSTAT) 0.4 MG SL tablet Place 1 tablet (0.4 mg total) under the tongue every 5 (five) minutes as needed for chest pain.  . Omega-3 Fatty Acids (FISH OIL PO) Take by mouth 2 (two) times daily.   . pioglitazone (ACTOS) 30 MG tablet Take 1 tablet (30 mg total) by mouth daily.  . ramipril (ALTACE) 5 MG capsule TAKE 1 BY MOUTH DAILY  . simvastatin (ZOCOR) 40 MG tablet TAKE 1 BY MOUTH DAILY    Review of Systems  Constitutional: Negative.   Respiratory: Negative.   Cardiovascular: Positive for chest pain.  Gastrointestinal: Negative.   Musculoskeletal: Negative.   Allergic/Immunologic: Negative.   Neurological: Negative.   Hematological: Negative.   Psychiatric/Behavioral: Negative.   All other systems reviewed and are negative.  BP 130/70 mmHg  Pulse 89  Ht 5\' 8"  (1.727 m)  Wt 227 lb (102.967 kg)  BMI 34.52 kg/m2  Physical Exam  Constitutional: He is oriented to person, place, and time. He appears well-developed and well-nourished.  HENT:  Head: Normocephalic.  Nose: Nose normal.  Mouth/Throat: Oropharynx is clear and moist.  Eyes: Conjunctivae are normal. Pupils are equal, round, and reactive to light.  Neck: Normal range of motion. Neck supple. No JVD present.  Cardiovascular: Normal rate, regular rhythm,  S1 normal, S2 normal, normal heart sounds and intact distal pulses.  Exam reveals no gallop and no friction rub.   No murmur heard. Pulmonary/Chest: Effort normal and breath sounds normal. No respiratory distress. He has no wheezes. He has no rales. He exhibits no tenderness.  Abdominal: Soft. Bowel sounds are normal. He exhibits no distension. There is no tenderness.  Musculoskeletal: Normal range of motion. He exhibits no edema or tenderness.   Lymphadenopathy:    He has no cervical adenopathy.  Neurological: He is alert and oriented to person, place, and time. Coordination normal.  Skin: Skin is warm and dry. No rash noted. No erythema.  Psychiatric: He has a normal mood and affect. His behavior is normal. Judgment and thought content normal.      Assessment and Plan   Nursing note and vitals reviewed.

## 2014-02-03 NOTE — Assessment & Plan Note (Signed)
Last catheterization in 2006. Stress to him that if he has any symptoms concerning for angina that he call our office for further evaluation

## 2014-02-03 NOTE — Patient Instructions (Signed)
You are doing well. No medication changes were made.  Please call if you have worsening chest pain  Please call us if you have new issues that need to be addressed before your next appt.  Your physician wants you to follow-up in: 6 months.  You will receive a reminder letter in the mail two months in advance. If you don't receive a letter, please call our office to schedule the follow-up appointment.

## 2014-02-03 NOTE — Assessment & Plan Note (Signed)
Continues to have hemoglobin A1c greater than 8. Unable to change his numbers through diet, unable to afford some of the new medications. Suggested he follow-up closely with Dr. Diona Browner, consider endocrinology consult if needed for other medication options. Poor control diabetes likely a major contributor to his underlying coronary disease

## 2014-02-16 ENCOUNTER — Other Ambulatory Visit (INDEPENDENT_AMBULATORY_CARE_PROVIDER_SITE_OTHER): Payer: BLUE CROSS/BLUE SHIELD

## 2014-02-16 DIAGNOSIS — I10 Essential (primary) hypertension: Secondary | ICD-10-CM

## 2014-02-16 DIAGNOSIS — Z125 Encounter for screening for malignant neoplasm of prostate: Secondary | ICD-10-CM

## 2014-02-16 DIAGNOSIS — E119 Type 2 diabetes mellitus without complications: Secondary | ICD-10-CM

## 2014-02-16 DIAGNOSIS — E785 Hyperlipidemia, unspecified: Secondary | ICD-10-CM

## 2014-02-16 LAB — COMPREHENSIVE METABOLIC PANEL
ALBUMIN: 4.1 g/dL (ref 3.5–5.2)
ALK PHOS: 60 U/L (ref 39–117)
ALT: 30 U/L (ref 0–53)
AST: 16 U/L (ref 0–37)
BUN: 19 mg/dL (ref 6–23)
CO2: 27 meq/L (ref 19–32)
Calcium: 9.5 mg/dL (ref 8.4–10.5)
Chloride: 100 mEq/L (ref 96–112)
Creatinine, Ser: 0.97 mg/dL (ref 0.40–1.50)
GFR: 84.62 mL/min (ref >60.00)
Glucose, Bld: 197 mg/dL — ABNORMAL HIGH (ref 70–99)
Potassium: 4.2 mEq/L (ref 3.5–5.1)
Sodium: 137 mEq/L (ref 135–145)
TOTAL PROTEIN: 7 g/dL (ref 6.0–8.3)
Total Bilirubin: 0.8 mg/dL (ref 0.2–1.2)

## 2014-02-16 LAB — HEMOGLOBIN A1C: Hgb A1c MFr Bld: 8.2 % — ABNORMAL HIGH (ref 4.6–6.5)

## 2014-02-16 LAB — LIPID PANEL
Cholesterol: 197 mg/dL (ref 0–200)
HDL: 34.1 mg/dL — ABNORMAL LOW (ref >39.00)
TRIGLYCERIDES: 402 mg/dL — AB (ref 0.0–149.0)
Total CHOL/HDL Ratio: 6

## 2014-02-16 LAB — LDL CHOLESTEROL, DIRECT: LDL DIRECT: 84 mg/dL

## 2014-02-16 LAB — PSA: PSA: 1.26 ng/mL (ref 0.10–4.00)

## 2014-02-20 ENCOUNTER — Ambulatory Visit: Payer: BC Managed Care – PPO | Admitting: Family Medicine

## 2014-02-27 ENCOUNTER — Encounter: Payer: Self-pay | Admitting: Family Medicine

## 2014-02-27 ENCOUNTER — Ambulatory Visit (INDEPENDENT_AMBULATORY_CARE_PROVIDER_SITE_OTHER): Payer: BLUE CROSS/BLUE SHIELD | Admitting: Family Medicine

## 2014-02-27 VITALS — BP 138/82 | HR 101 | Temp 98.7°F | Ht 67.0 in | Wt 230.5 lb

## 2014-02-27 DIAGNOSIS — IMO0002 Reserved for concepts with insufficient information to code with codable children: Secondary | ICD-10-CM

## 2014-02-27 DIAGNOSIS — E785 Hyperlipidemia, unspecified: Secondary | ICD-10-CM

## 2014-02-27 DIAGNOSIS — I10 Essential (primary) hypertension: Secondary | ICD-10-CM

## 2014-02-27 DIAGNOSIS — E1165 Type 2 diabetes mellitus with hyperglycemia: Secondary | ICD-10-CM

## 2014-02-27 DIAGNOSIS — E118 Type 2 diabetes mellitus with unspecified complications: Secondary | ICD-10-CM

## 2014-02-27 LAB — HM DIABETES FOOT EXAM

## 2014-02-27 MED ORDER — LIRAGLUTIDE 18 MG/3ML ~~LOC~~ SOPN: 0.6000 mg | PEN_INJECTOR | Freq: Every day | SUBCUTANEOUS | Status: DC

## 2014-02-27 NOTE — Progress Notes (Signed)
Pre visit review using our clinic review tool, if applicable. No additional management support is needed unless otherwise documented below in the visit note. 

## 2014-02-27 NOTE — Assessment & Plan Note (Signed)
Well controlled. Continue current medication.  

## 2014-02-27 NOTE — Assessment & Plan Note (Signed)
Stop actos, start victoza low dose, close follow up.et back on track with lifestyle changes.

## 2014-02-27 NOTE — Assessment & Plan Note (Signed)
Not quite at goal , will work on lifestyle and lowering sugar to lower trigs.

## 2014-02-27 NOTE — Patient Instructions (Signed)
Get back to exercising. Work on healthy low carb and low fat diet.  Stop Actos, start victoza each morning.  Follow up in 2-4 weeks.

## 2014-02-27 NOTE — Progress Notes (Signed)
59 year old male with CAD presents for 3 month follow up.  Hypertension:At goal today on metoprolol and altace. BP Readings from Last 3 Encounters:  02/27/14 138/82  02/03/14 130/70  11/14/13 112/70  Chest pain with exertion: one episode going up hill.. Told cards at recent [place].Marland Kitchen Plan following. No further episodes. Edema:None Short of breath:None Average home BPs:  120/70s Other issues: Wt Readings from Last 3 Encounters:  02/27/14 230 lb 8 oz (104.554 kg)  02/03/14 227 lb (102.967 kg)  11/14/13 223 lb 4 oz (101.266 kg)   CAD: saw Dr Rockey Situ 2 weeks ago. EKG unremarkable.   Diabetes: inadequately controlled,  metformin XR 1 in AM and 2 in PM ( diarrhea improved on lower dose), glimperide 2 tabs a day max, actos 30 mg daily. Lab Results  Component Value Date   HGBA1C 8.2* 02/16/2014  Hypoglycemic episodes: none  Hyperglycemic episodes:occ  Feet problems: No ulcers.  Blood Sugars averaging: FBS 150-200 Has seen a nutritionist.   Has not been exercising as much as previosuly He is working aggressively on diet. Stopping oatmeal., Eating more salads.   Elevated Cholesterol: not at goal , 70 on  simvstatin 40 mg daily Using medications without problems: None Muscle aches: None Diet compliance: [place] Exercise: none Other complaints:     Review of Systems  Constitutional: Negative for fever and fatigue.  HENT: Negative for ear pain.  Eyes: Negative for pain.  Respiratory: Negative for cough and shortness of breath.  Cardiovascular: Negative for chest pain, palpitations and leg swelling.  Gastrointestinal: Negative for abdominal pain.  Objective:   Physical Exam  Constitutional: He is oriented to person, place, and time. Vital signs are normal. He appears well-developed and well-nourished.  HENT:  Head: Normocephalic.  Right Ear: Hearing normal.  Left Ear: Hearing normal.  Nose: Nose normal.  Mouth/Throat: Oropharynx is clear and moist and mucous  membranes are normal.  Neck: Trachea normal. Carotid bruit is not present. No mass and no thyromegaly present.  Cardiovascular: Normal rate, regular rhythm and normal pulses. Exam reveals no gallop, no distant heart sounds and no friction rub.  No murmur heard. No peripheral edema  Pulmonary/Chest: Effort normal and breath sounds normal. No respiratory distress.  Abdominal: Soft. There is no tenderness.  Neurological: He is alert and oriented to person, place, and time.  Skin: Skin is warm, dry and intact. No rash noted.  Psychiatric: He has a normal mood and affect. His speech is normal and behavior is normal. Thought content normal.    Diabetic foot exam:  Normal inspection  No skin breakdown  No calluses  Normal DP pulses  Normal sensation to light touch and monofilament  Nails normal

## 2014-03-02 ENCOUNTER — Other Ambulatory Visit: Payer: Self-pay | Admitting: *Deleted

## 2014-03-02 MED ORDER — LEVOTHYROXINE SODIUM 100 MCG PO TABS: 100.0000 ug | ORAL_TABLET | Freq: Every day | ORAL | Status: DC

## 2014-03-02 MED ORDER — GLIMEPIRIDE 4 MG PO TABS: ORAL_TABLET | ORAL | Status: DC

## 2014-03-02 NOTE — Addendum Note (Signed)
Addended by: Carter Kitten on: 03/02/2014 05:05 PM   Modules accepted: Orders

## 2014-04-23 ENCOUNTER — Other Ambulatory Visit: Payer: Self-pay | Admitting: Family Medicine

## 2014-05-28 ENCOUNTER — Telehealth: Payer: Self-pay | Admitting: *Deleted

## 2014-05-28 NOTE — Telephone Encounter (Signed)
Patient called regarding new Rx for Victoza.  He had some concerns about possible side effects and wanted to try a sample before purchasing the medication.  He was informed at his visit that we do not carry samples in this office.  The patient has contacted the manufacturer who explained that we could get a sample for him by requesting it from our pharmaceutical rep.  I explained that this is something that we typically do not do, but he insists on checking with Dr. Diona Browner.  Please advise.

## 2014-05-29 NOTE — Telephone Encounter (Signed)
Please run this by Vincente Liberty. I would be fine with doing if it is within our policy. I do not know sho rep is.. We would need contact info.

## 2014-05-29 NOTE — Telephone Encounter (Signed)
Left message for Mr. Almond that, per our office manager,  we are unable to obtain a Victoza sample from the pharmaceutical rep.

## 2014-06-05 ENCOUNTER — Other Ambulatory Visit: Payer: Self-pay | Admitting: Cardiovascular Disease

## 2014-06-15 ENCOUNTER — Telehealth: Payer: Self-pay | Admitting: *Deleted

## 2014-06-15 NOTE — Telephone Encounter (Signed)
He needs a signed med list to present to [employer] when he travels.

## 2014-06-15 NOTE — Telephone Encounter (Signed)
Medication list printed and placed in Dr. Rometta Emery in box for signature.

## 2014-06-15 NOTE — Telephone Encounter (Signed)
Patient left a voicemail stating that he is going out of the country and needs a list of his medications with Dr. Arley Phenix signature. Patient stated that this is now required by [employer].

## 2014-06-15 NOTE — Telephone Encounter (Signed)
Pt wife calling asking if we can write a letter for patient stating that he was prescribed the medication we have given to him. For traveling reasons.  For he is leaving the country Saturday may 21  Wife states she will pick it up.  Please call when ready.

## 2014-06-16 NOTE — Telephone Encounter (Signed)
Zerek notified medication list with signature is ready to be picked up at the front desk.

## 2014-06-17 NOTE — Telephone Encounter (Signed)
Spoke w/ Juan [name].  Advised her that I am leaving letter at the front desk for her to pick up at her convenience.  She is appreciative and will call back w/ any questions or concerns.

## 2014-06-17 NOTE — Telephone Encounter (Signed)
We can prevent AVS and sign  or write a letter with list of meds and I can sign

## 2014-07-24 ENCOUNTER — Telehealth: Payer: Self-pay

## 2014-07-24 NOTE — Telephone Encounter (Signed)
Diabetic Bundle. I contacted the pt and advised his A1C blood test is due. Pt advised to contact PCP's office to schedule.

## 2014-09-07 ENCOUNTER — Other Ambulatory Visit: Payer: Self-pay | Admitting: Family Medicine

## 2014-09-07 ENCOUNTER — Other Ambulatory Visit: Payer: Self-pay | Admitting: Cardiovascular Disease

## 2014-09-07 NOTE — Telephone Encounter (Signed)
Electronic refill request.   Looks like patient should have followed up in February 2016 after last OV in January.  No follow up noted.  Please advise on refill request.

## 2014-09-08 MED ORDER — GLIMEPIRIDE 4 MG PO TABS: ORAL_TABLET | ORAL | Status: DC

## 2014-09-08 NOTE — Telephone Encounter (Signed)
Pt needs follow up refill until appt

## 2014-09-08 NOTE — Telephone Encounter (Signed)
Message left advising patient to call and schedule f/u for further refills prior to running out of meds. Rx sent in.

## 2014-09-16 ENCOUNTER — Telehealth: Payer: Self-pay | Admitting: *Deleted

## 2014-09-16 NOTE — Telephone Encounter (Signed)
Last office visit 02/27/2014.  Last TSH 04/11/2013.  Ok to refill?

## 2014-09-16 NOTE — Telephone Encounter (Signed)
Pt wanting to see if we send referral out, stated to him he has to go through PCP  Pt understood and would give them a call.

## 2014-09-17 NOTE — Telephone Encounter (Signed)
Juan [name],  Will you please call and schedule CPE with fasting labs prior for Juan [name].  Once he is scheduled please route back to me and I will refill his medication to get him to that appointment.

## 2014-09-17 NOTE — Telephone Encounter (Signed)
Left message asking pt to call office. Dr Diona Browner has opening 11/1 @ 11:15 see if pt wants that date

## 2014-09-17 NOTE — Telephone Encounter (Signed)
Needs CPX witrh labs prior.. Refill until then.

## 2014-09-20 MED ORDER — LEVOTHYROXINE SODIUM 100 MCG PO TABS: 100.0000 ug | ORAL_TABLET | Freq: Every day | ORAL | Status: DC

## 2014-09-22 NOTE — Telephone Encounter (Signed)
Left message asking pt to call office  °

## 2014-09-23 NOTE — Telephone Encounter (Signed)
Left message asking pt to call office  °

## 2014-09-24 NOTE — Telephone Encounter (Signed)
Left message asking pt to call office  °

## 2014-09-28 ENCOUNTER — Encounter: Payer: Self-pay | Admitting: Family Medicine

## 2014-09-28 NOTE — Telephone Encounter (Signed)
Mailed letter asking pt to call office to schedule appointment Please close

## 2014-09-28 NOTE — Telephone Encounter (Signed)
Left message asking pt to call office  °

## 2014-10-12 ENCOUNTER — Other Ambulatory Visit: Payer: Self-pay | Admitting: Cardiovascular Disease

## 2014-11-16 ENCOUNTER — Telehealth: Payer: Self-pay

## 2014-11-16 NOTE — Telephone Encounter (Signed)
Spoke w/ pt's wife. She reports that pt fell about a month ago and has some discomfort in his groin.  She reports that pt has not had any other caths since 2006. Advised her to contact PCP, as pt may have torn a muscle. She will call back if we can be of further assistance, as pt is in between PCPs at the moment.

## 2014-11-16 NOTE — Telephone Encounter (Signed)
Pt wife called, states about 1 month ago, pt slipped when he was out of town, and states he hurt his ankle. States after several days a bruised appeared on his ankle, along with swelling. Pt wife states since then he has had a hurting in the groin area where he had his cath. Please call.

## 2014-12-07 ENCOUNTER — Other Ambulatory Visit: Payer: Self-pay | Admitting: Cardiovascular Disease

## 2014-12-09 ENCOUNTER — Ambulatory Visit: Payer: BLUE CROSS/BLUE SHIELD | Admitting: Nurse Practitioner

## 2015-01-07 ENCOUNTER — Ambulatory Visit (INDEPENDENT_AMBULATORY_CARE_PROVIDER_SITE_OTHER): Payer: BLUE CROSS/BLUE SHIELD | Admitting: Nurse Practitioner

## 2015-01-07 ENCOUNTER — Encounter: Payer: Self-pay | Admitting: Nurse Practitioner

## 2015-01-07 VITALS — BP 140/80 | HR 85 | Ht 68.0 in | Wt 210.5 lb

## 2015-01-07 DIAGNOSIS — R011 Cardiac murmur, unspecified: Secondary | ICD-10-CM

## 2015-01-07 DIAGNOSIS — I25119 Atherosclerotic heart disease of native coronary artery with unspecified angina pectoris: Secondary | ICD-10-CM

## 2015-01-07 DIAGNOSIS — I10 Essential (primary) hypertension: Secondary | ICD-10-CM | POA: Diagnosis not present

## 2015-01-07 DIAGNOSIS — I2510 Atherosclerotic heart disease of native coronary artery without angina pectoris: Secondary | ICD-10-CM | POA: Diagnosis not present

## 2015-01-07 DIAGNOSIS — E119 Type 2 diabetes mellitus without complications: Secondary | ICD-10-CM

## 2015-01-07 DIAGNOSIS — E039 Hypothyroidism, unspecified: Secondary | ICD-10-CM | POA: Insufficient documentation

## 2015-01-07 DIAGNOSIS — E785 Hyperlipidemia, unspecified: Secondary | ICD-10-CM

## 2015-01-07 MED ORDER — METOPROLOL TARTRATE 50 MG PO TABS: 50.0000 mg | ORAL_TABLET | Freq: Two times a day (BID) | ORAL | Status: DC

## 2015-01-07 NOTE — Patient Instructions (Addendum)
Medication Instructions:  Please increase your metoprolol to 50 mg twice daily  Labwork: None  Testing/Procedures: Your physician has requested that you have an echocardiogram. Echocardiography is a painless test that uses sound waves to create images of your heart. It provides your doctor with information about the size and shape of your heart and how well your heart's chambers and valves are working. This procedure takes approximately one hour. There are no restrictions for this procedure.  Date & time: ________________________________  Follow-Up: 3 month w/ Dr. Rockey Situ  If you need a refill on your cardiac medications before your next appointment, please call your pharmacy.   Echocardiogram An echocardiogram, or echocardiography, uses sound waves (ultrasound) to produce an image of your heart. The echocardiogram is simple, painless, obtained within a short period of time, and offers valuable information to your health care provider. The images from an echocardiogram can provide information such as:  Evidence of coronary artery disease (CAD).  Heart size.  Heart muscle function.  Heart valve function.  Aneurysm detection.  Evidence of a past heart attack.  Fluid buildup around the heart.  Heart muscle thickening.  Assess heart valve function. LET [facility] CARE PROVIDER KNOW ABOUT:  Any allergies you have.  All medicines you are taking, including vitamins, herbs, eye drops, creams, and over-the-counter medicines.  Previous problems you or members of your family have had with the use of anesthetics.  Any blood disorders you have.  Previous surgeries you have had.  Medical conditions you have.  Possibility of pregnancy, if this applies. BEFORE THE PROCEDURE  No special preparation is needed. Eat and drink normally.  PROCEDURE   In order to produce an image of your heart, gel will be applied to your chest and a wand-like tool (transducer) will be moved over  your chest. The gel will help transmit the sound waves from the transducer. The sound waves will harmlessly bounce off your heart to allow the heart images to be captured in real-time motion. These images will then be recorded.  You may need an IV to receive a medicine that improves the quality of the pictures. AFTER THE PROCEDURE You may return to your normal schedule including diet, activities, and medicines, unless your health care provider tells you otherwise.   This information is not intended to replace advice given to you by your health care provider. Make sure you discuss any questions you have with your health care provider.   Document Released: 01/14/2000 Document Revised: 02/06/2014 Document Reviewed: 09/23/2012 Elsevier Interactive Patient Education [employer].

## 2015-01-07 NOTE — Progress Notes (Signed)
Patient Name: Juan [name] Date of Encounter: 01/07/2015  Primary Care Provider:  Eliezer Lofts, MD Primary Cardiologist:  Johnny Bridge, MD   Chief Complaint  59 y/o male with a h/o CAD s/p multiple PCI's who presents secondary intermittent exertional c/p.  Past Medical History   Past Medical History  Diagnosis Date  . Essential hypertension   . Hyperlipidemia   . Coronary artery disease     a. Dating back to 43 w/ h/o MI.  8 stents;  b. 10/2003 Cath [place]: LM 20d, RI 40p, LAD 30p, 41m, patent stent, D1 50p, LCX 30p, patent mid stent, 90d/diffuse, RCA dominant, 30p, patent stent, 28m/d, EF 44%-->Med Rx.  . Type II diabetes mellitus ([place])   . Mild sleep apnea   . Hypothyroidism    Past Surgical History  Procedure Laterality Date  . Thyroidectomy    . Cardiac catheterization  1993    [place]; Dr.Harold; [place]; Dr. Joneen Boers; [employer]  . Cardiac catheterization  1996    ""  . Cardiac catheterization      ''''  . Cardiac catheterization      '''  . Cardiac catheterization      '''  . Cardiac catheterization  2002    '''  . Cardiac catheterization  2006    [facility]; [place]   . Cyst excision      right  . Nasal septum surgery      Allergies  No Known Allergies  HPI  59 y/o male with the above complex PMH.  He has a h/o CAD and is s/p multiple PCI's.  His last cath was in 2005 in [place] and revealed patent LAD, LCX, and RCA stents.  He was last seen in clinic in 01/2014 @ which time he reported that he was doing well.  He did have one episode of c/p in the summer of 2015, but otw was well.  Since then, he has noted intermittent exertional chest discomfort.  He now says that it has been intermittent since the summer of 2015.  Though it only occurs with fairly heavy exertion, he is unable to predict when it might occur as the same activity two days in a row may not elicit the same symptoms.  Ss occur monthly to  less than once a month and only last a few mins prior to resolving with rest.  He has never taken ntg for c/p.  He denies palpitations, dyspnea, pnd, orthopnea, n, v, dizziness, syncope, edema, weight gain, or early satiety.   Home Medications  Prior to Admission medications   Medication Sig Start Date End Date Taking? Authorizing Provider  amLODipine (NORVASC) 5 MG tablet TAKE 1 BY MOUTH DAILY 09/07/14  Yes Minna Merritts, MD  aspirin 81 MG tablet Take 81 mg by mouth daily.   Yes Historical Provider, MD  clopidogrel (PLAVIX) 75 MG tablet TAKE 1 BY MOUTH DAILY 10/12/14  Yes Minna Merritts, MD  folic acid (FOLVITE) [id number] MCG tablet Take 400 mcg by mouth daily.   Yes Historical Provider, MD  glimepiride (AMARYL) 4 MG tablet TAKE 2 BY MOUTH DAILY BEFOR E BREAKFAST 09/08/14  Yes Amy Cletis Athens, MD  levothyroxine (SYNTHROID) 100 MCG tablet Take 1 tablet (100 mcg total) by mouth daily before breakfast. 09/20/14  Yes Amy E Bedsole, MD  metFORMIN (GLUCOPHAGE-XR) 500 MG 24 hr tablet TAKE 2 BY MOUTH TWICE DAILY 04/23/14  Yes Amy E Bedsole,  MD  metoprolol tartrate (LOPRESSOR) 50 MG tablet Take 1 tablet (50 mg total) by mouth 2 (two) times daily. 01/07/15  Yes Rogelia Mire, NP  Multiple Vitamin (MULTIVITAMIN) tablet Take 2 tablets by mouth 2 (two) times daily.    Yes Historical Provider, MD  nitroGLYCERIN (NITROSTAT) 0.4 MG SL tablet Place 1 tablet (0.4 mg total) under the tongue every 5 (five) minutes as needed for chest pain. 11/14/13  Yes Amy Cletis Athens, MD  Omega-3 Fatty Acids (FISH OIL PO) Take 900 mg by mouth 2 (two) times daily.    Yes Historical Provider, MD  ramipril (ALTACE) 5 MG capsule TAKE 1 BY MOUTH DAILY 06/05/14  Yes Minna Merritts, MD  simvastatin (ZOCOR) 40 MG tablet TAKE 1 BY MOUTH DAILY 12/07/14  Yes Minna Merritts, MD    Review of Systems  Exertional c/p as above.  He denies palpitations, dyspnea, pnd, orthopnea, n, v, dizziness, syncope, edema, weight gain, or early satiety.   All  other systems reviewed and are otherwise negative except as noted above.  Physical Exam  VS:  BP 140/80 mmHg  Pulse 85  Ht 5\' 8"  (1.727 m)  Wt 210 lb 8 oz (95.482 kg)  BMI 32.01 kg/m2 , BMI Body mass index is 32.01 kg/(m^2). GEN: Well nourished, well developed, in no acute distress. HEENT: normal. Neck: Supple, no JVD, carotid bruits, or masses. Cardiac: RRR, 2/6 early systolic murmur heard best at the RUSB but can be heard throughout.  Nl S2.  No rubs, or gallops. No clubbing, cyanosis, edema.  Radials/DP/PT 2+ and equal bilaterally.  Respiratory:  Respirations regular and unlabored, clear to auscultation bilaterally. GI: Soft, nontender, nondistended, BS + x 4. MS: no deformity or atrophy. Skin: warm and dry, no rash. Neuro:  Strength and sensation are intact. Psych: Normal affect.  Accessory Clinical Findings  ECG - RSR, 85, non-specific st changes - no acute changes.  Assessment & Plan  1.  CAD with stable exertional angina:  Pt has a nearly 18 month h/o stable exertional angina that is relieved with rest.  Though Ss always occur with exertion, he says that he is not able to predict what amt of activity might elicit Ss as it seems to be variable and unpredictable.  He is not particularly bothered by his Ss and does not feel that they are significantly lifestyle limiting.  He is not interested in either stress testing (he says he has had false neg studies in the past) or cath at this time.  His BP and HR are mod elevated today.  I will increase his lopressor to 50 mg BID.  We did discuss the use of prn nitrates and also the indication for calling 911 if c/p were unrelenting.  If titration of bb does not help to relieve Ss, we could also consider long acting nitrates however he notes that the ntg patch caused headaches in the past.  Cont asa, plavix, acei, and statin therapy.  I will arrange for f/u in 3 mos and he knows to call if Ss increase in frequency or severity.  2.  Essential  HTN:  BP mod elevated today.  Titrating bb as above.  Cont acei and ccb.  3.  HL:  Cont simvastatin.  Last LDL in our system was from 03/2013 - 61.  Nl LFT's in 01/2014.  4.  DM II:  Managed by primary care.  5.  Systolic Murmur:  Pt with 2/6 SEM @ USB - heard throughout.  Not documented before.  He is not aware of prior h/o murmur.  Will obtain echo.  6.  F/u in 3 mos or sooner if necessary.  Murray Hodgkins, NP 01/07/2015, 4:02 PM

## 2015-01-08 ENCOUNTER — Other Ambulatory Visit: Payer: Self-pay

## 2015-01-08 ENCOUNTER — Ambulatory Visit (INDEPENDENT_AMBULATORY_CARE_PROVIDER_SITE_OTHER): Payer: BLUE CROSS/BLUE SHIELD

## 2015-01-08 DIAGNOSIS — I25119 Atherosclerotic heart disease of native coronary artery with unspecified angina pectoris: Secondary | ICD-10-CM

## 2015-01-08 DIAGNOSIS — E119 Type 2 diabetes mellitus without complications: Secondary | ICD-10-CM | POA: Diagnosis not present

## 2015-01-08 DIAGNOSIS — R011 Cardiac murmur, unspecified: Secondary | ICD-10-CM | POA: Diagnosis not present

## 2015-01-19 ENCOUNTER — Emergency Department: Payer: BLUE CROSS/BLUE SHIELD

## 2015-01-19 ENCOUNTER — Encounter: Payer: Self-pay | Admitting: Urgent Care

## 2015-01-19 ENCOUNTER — Observation Stay
Admission: EM | Admit: 2015-01-19 | Discharge: 2015-01-21 | Disposition: A | Discharge status: Home / Self Care | Payer: BLUE CROSS/BLUE SHIELD | Attending: Internal Medicine | Admitting: Internal Medicine

## 2015-01-19 DIAGNOSIS — L309 Dermatitis, unspecified: Secondary | ICD-10-CM | POA: Insufficient documentation

## 2015-01-19 DIAGNOSIS — I252 Old myocardial infarction: Secondary | ICD-10-CM | POA: Insufficient documentation

## 2015-01-19 DIAGNOSIS — R079 Chest pain, unspecified: Secondary | ICD-10-CM | POA: Diagnosis present

## 2015-01-19 DIAGNOSIS — E890 Postprocedural hypothyroidism: Secondary | ICD-10-CM | POA: Diagnosis not present

## 2015-01-19 DIAGNOSIS — I25110 Atherosclerotic heart disease of native coronary artery with unstable angina pectoris: Secondary | ICD-10-CM | POA: Diagnosis not present

## 2015-01-19 DIAGNOSIS — Z79899 Other long term (current) drug therapy: Secondary | ICD-10-CM | POA: Insufficient documentation

## 2015-01-19 DIAGNOSIS — Z7982 Long term (current) use of aspirin: Secondary | ICD-10-CM | POA: Diagnosis not present

## 2015-01-19 DIAGNOSIS — R634 Abnormal weight loss: Secondary | ICD-10-CM | POA: Diagnosis not present

## 2015-01-19 DIAGNOSIS — Z955 Presence of coronary angioplasty implant and graft: Secondary | ICD-10-CM | POA: Diagnosis not present

## 2015-01-19 DIAGNOSIS — K219 Gastro-esophageal reflux disease without esophagitis: Secondary | ICD-10-CM | POA: Diagnosis not present

## 2015-01-19 DIAGNOSIS — I200 Unstable angina: Secondary | ICD-10-CM | POA: Insufficient documentation

## 2015-01-19 DIAGNOSIS — Z7902 Long term (current) use of antithrombotics/antiplatelets: Secondary | ICD-10-CM | POA: Insufficient documentation

## 2015-01-19 DIAGNOSIS — G4730 Sleep apnea, unspecified: Secondary | ICD-10-CM | POA: Insufficient documentation

## 2015-01-19 DIAGNOSIS — I259 Chronic ischemic heart disease, unspecified: Secondary | ICD-10-CM | POA: Diagnosis not present

## 2015-01-19 DIAGNOSIS — I10 Essential (primary) hypertension: Secondary | ICD-10-CM | POA: Insufficient documentation

## 2015-01-19 DIAGNOSIS — R0602 Shortness of breath: Secondary | ICD-10-CM | POA: Insufficient documentation

## 2015-01-19 DIAGNOSIS — Z794 Long term (current) use of insulin: Secondary | ICD-10-CM | POA: Insufficient documentation

## 2015-01-19 DIAGNOSIS — Z8249 Family history of ischemic heart disease and other diseases of the circulatory system: Secondary | ICD-10-CM | POA: Insufficient documentation

## 2015-01-19 DIAGNOSIS — E1165 Type 2 diabetes mellitus with hyperglycemia: Secondary | ICD-10-CM | POA: Diagnosis not present

## 2015-01-19 DIAGNOSIS — D696 Thrombocytopenia, unspecified: Secondary | ICD-10-CM | POA: Diagnosis not present

## 2015-01-19 DIAGNOSIS — R000 Tachycardia, unspecified: Secondary | ICD-10-CM | POA: Diagnosis not present

## 2015-01-19 DIAGNOSIS — E785 Hyperlipidemia, unspecified: Secondary | ICD-10-CM | POA: Diagnosis not present

## 2015-01-19 LAB — CBC WITH DIFFERENTIAL/PLATELET
BASOS ABS: 0.1 10*3/uL (ref 0–0.1)
Basophils Relative: 1 %
EOS ABS: 0.1 10*3/uL (ref 0–0.7)
EOS PCT: 1 %
HCT: 46 % (ref 40.0–52.0)
Hemoglobin: 16 g/dL (ref 13.0–18.0)
LYMPHS PCT: 11 %
Lymphs Abs: 1 10*3/uL (ref 1.0–3.6)
MCH: 31.7 pg (ref 26.0–34.0)
MCHC: 34.8 g/dL (ref 32.0–36.0)
MCV: 91.1 fL (ref 80.0–100.0)
MONO ABS: 0.9 10*3/uL (ref 0.2–1.0)
Monocytes Relative: 9 %
Neutro Abs: 7.6 10*3/uL — ABNORMAL HIGH (ref 1.4–6.5)
Neutrophils Relative %: 78 %
PLATELETS: 119 10*3/uL — AB (ref 150–440)
RBC: 5.05 MIL/uL (ref 4.40–5.90)
RDW: 13.4 % (ref 11.5–14.5)
WBC: 9.7 10*3/uL (ref 3.8–10.6)

## 2015-01-19 LAB — BASIC METABOLIC PANEL
ANION GAP: 10 (ref 5–15)
BUN: 19 mg/dL (ref 6–20)
CO2: 23 mmol/L (ref 22–32)
Calcium: 8.6 mg/dL — ABNORMAL LOW (ref 8.9–10.3)
Chloride: 99 mmol/L — ABNORMAL LOW (ref 101–111)
Creatinine, Ser: 0.78 mg/dL (ref 0.61–1.24)
GLUCOSE: 330 mg/dL — AB (ref 65–99)
Potassium: 4.3 mmol/L (ref 3.5–5.1)
Sodium: 132 mmol/L — ABNORMAL LOW (ref 135–145)

## 2015-01-19 LAB — TROPONIN I

## 2015-01-19 MED ORDER — ONDANSETRON HCL 4 MG PO TABS: 4.0000 mg | ORAL_TABLET | Freq: Four times a day (QID) | ORAL | Status: DC | PRN

## 2015-01-19 MED ORDER — ACETAMINOPHEN 325 MG PO TABS: 650.0000 mg | ORAL_TABLET | Freq: Four times a day (QID) | ORAL | Status: DC | PRN

## 2015-01-19 MED ORDER — ONDANSETRON HCL 4 MG/2ML IJ SOLN: 4.0000 mg | Freq: Four times a day (QID) | INTRAMUSCULAR | Status: DC | PRN

## 2015-01-19 MED ORDER — SODIUM CHLORIDE 0.9 % IJ SOLN
3.0000 mL | Freq: Two times a day (BID) | INTRAMUSCULAR | Status: DC
  Administered 2015-01-19 – 2015-01-20 (×2): 3 mL via INTRAVENOUS

## 2015-01-19 MED ORDER — MORPHINE SULFATE (PF) 2 MG/ML IV SOLN: 2.0000 mg | INTRAVENOUS | Status: DC | PRN

## 2015-01-19 MED ORDER — OXYCODONE HCL 5 MG PO TABS: 5.0000 mg | ORAL_TABLET | ORAL | Status: DC | PRN

## 2015-01-19 MED ORDER — HEPARIN SODIUM (PORCINE) 5000 UNIT/ML IJ SOLN
5000.0000 [IU] | Freq: Three times a day (TID) | INTRAMUSCULAR | Status: DC
  Administered 2015-01-19 – 2015-01-21 (×6): 5000 [IU] via SUBCUTANEOUS
  Filled 2015-01-19 (×6): qty 1

## 2015-01-19 MED ORDER — ACETAMINOPHEN 650 MG RE SUPP: 650.0000 mg | Freq: Four times a day (QID) | RECTAL | Status: DC | PRN

## 2015-01-19 NOTE — ED Notes (Signed)
Patient presents with c/o LEFT sided CP with (+) LEFT neck numbness; also has numbness to BUE. Symptoms started today around 1400 while "walking down the hall." (+) cardiac history - under the care of Gollan, MD - has had cardiac stents x 10 placed.

## 2015-01-19 NOTE — H&P (Signed)
[place] [facility] [place] NAME: Stefone Sherling    MR#:  [id number]  DATE OF BIRTH:  04-Jun-1955   DATE OF ADMISSION:  01/19/2015  PRIMARY CARE PHYSICIAN: No primary care provider on file.   REQUESTING/REFERRING PHYSICIAN: Archie Balboa  CHIEF COMPLAINT:   Chief Complaint  Patient presents with  . Chest Pain    HISTORY OF PRESENT ILLNESS:  Sylvestre Drotar  is a 59 y.o. male with a known history of coronary artery disease status post a multitude of stents (10) as well as a history of type 2 diabetes non-insulin-requiring presenting with chest pain Acute onset chest pain at rest retrosternal location pressure in quality 8/10 intensity nonradiatingno associated symptoms no worsening/relieving factors currently resolved  PAST MEDICAL HISTORY:   Past Medical History  Diagnosis Date  . Essential hypertension   . Hyperlipidemia   . Coronary artery disease     a. Dating back to 34 w/ h/o MI.  8 stents;  b. 10/2003 Cath [place]: LM 20d, RI 40p, LAD 30p, 40m, patent stent, D1 50p, LCX 30p, patent mid stent, 90d/diffuse, RCA dominant, 30p, patent stent, 57m/d, EF 44%-->Med Rx.  . Type II diabetes mellitus ([place])   . Mild sleep apnea   . Hypothyroidism     PAST SURGICAL HISTORY:   Past Surgical History  Procedure Laterality Date  . Thyroidectomy    . Cardiac catheterization  1993    [place]; Dr.Harold; [place]; Dr. Joneen Boers; [employer]  . Cardiac catheterization  1996    ""  . Cardiac catheterization      ''''  . Cardiac catheterization      '''  . Cardiac catheterization      '''  . Cardiac catheterization  2002    '''  . Cardiac catheterization  2006    [facility]; [place].   . Cyst excision      right  . Nasal septum surgery    . Cardiac stent      x 10    SOCIAL HISTORY:   Social History  Substance Use Topics  . Smoking status: Never Smoker   . Smokeless  tobacco: Never Used  . Alcohol Use: 0.5 oz/week    1 Standard drinks or equivalent per week     Comment: occassional    FAMILY HISTORY:   Family History  Problem Relation Age of Onset  . Heart attack Mother   . Heart attack Father   . Cancer Father 57    early prostate cancer  . Drug abuse Sister   . Cirrhosis Sister     DRUG ALLERGIES:  No Known Allergies  REVIEW OF SYSTEMS:  REVIEW OF SYSTEMS:  CONSTITUTIONAL: Denies fevers, chills, fatigue, weakness.  EYES: Denies blurred vision, double vision, or eye pain.  EARS, NOSE, THROAT: Denies tinnitus, ear pain, hearing loss.  RESPIRATORY: denies cough, shortness of breath, wheezing  CARDIOVASCULAR: Positive chest pain, denies palpitations, edema.  GASTROINTESTINAL: Denies nausea, vomiting, diarrhea, abdominal pain.  GENITOURINARY: Denies dysuria, hematuria.  ENDOCRINE: Denies nocturia or thyroid problems. HEMATOLOGIC AND LYMPHATIC: Denies easy bruising or bleeding.  SKIN: Denies rash or lesions.  MUSCULOSKELETAL: Denies pain in neck, back, shoulder, knees, hips, or further arthritic symptoms.  NEUROLOGIC: Denies paralysis, paresthesias.  PSYCHIATRIC: Denies anxiety or depressive symptoms. Otherwise full review of systems performed by me is negative.   MEDICATIONS AT HOME:   Prior to Admission medications  Medication Sig Start Date End Date Taking? Authorizing Provider  amLODipine (NORVASC) 5 MG tablet TAKE 1 BY MOUTH DAILY 09/07/14  Yes Minna Merritts, MD  aspirin 81 MG tablet Take 81 mg by mouth daily.   Yes Historical Provider, MD  clopidogrel (PLAVIX) 75 MG tablet TAKE 1 BY MOUTH DAILY 10/12/14  Yes Minna Merritts, MD  folic acid (FOLVITE) [id number] MCG tablet Take 400 mcg by mouth daily.   Yes Historical Provider, MD  glimepiride (AMARYL) 4 MG tablet TAKE 2 BY MOUTH DAILY BEFOR E BREAKFAST 09/08/14  Yes Amy Cletis Athens, MD  levothyroxine (SYNTHROID) 100 MCG tablet Take 1 tablet (100 mcg total) by mouth daily before breakfast.  09/20/14  Yes Amy E Bedsole, MD  metFORMIN (GLUCOPHAGE-XR) 500 MG 24 hr tablet TAKE 2 BY MOUTH TWICE DAILY 04/23/14  Yes Amy Cletis [place], MD  metoprolol tartrate (LOPRESSOR) 50 MG tablet Take 1 tablet (50 mg total) by mouth 2 (two) times daily. 01/07/15  Yes Rogelia Mire, NP  Multiple Vitamin (MULTIVITAMIN) tablet Take 1 tablet by mouth 2 (two) times daily.    Yes Historical Provider, MD  nitroGLYCERIN (NITROSTAT) 0.4 MG SL tablet Place 1 tablet (0.4 mg total) under the tongue every 5 (five) minutes as needed for chest pain. 11/14/13  Yes Amy Cletis Athens, MD  Omega-3 Fatty Acids (FISH OIL PO) Take 1,800 mg by mouth daily.    Yes Historical Provider, MD  ramipril (ALTACE) 5 MG capsule TAKE 1 BY MOUTH DAILY 06/05/14  Yes Minna Merritts, MD  simvastatin (ZOCOR) 40 MG tablet TAKE 1 BY MOUTH DAILY 12/07/14  Yes Minna Merritts, MD      VITAL SIGNS:  Blood pressure 132/81, pulse 97, resp. rate 16, height 5\' 8"  (1.727 m), weight 205 lb (92.987 kg), SpO2 97 %.  PHYSICAL EXAMINATION:  VITAL SIGNS: Filed Vitals:   01/19/15 2201 01/19/15 2312  BP: 140/78 132/81  Pulse: 101 97  Resp: 20 16   GENERAL:59 y.o.male currently in no acute distress.  HEAD: Normocephalic, atraumatic.  EYES: Pupils equal, round, reactive to light. Extraocular muscles intact. No scleral icterus.  MOUTH: Moist mucosal membrane. Dentition intact. No abscess noted.  EAR, NOSE, THROAT: Clear without exudates. No external lesions.  NECK: Supple. No thyromegaly. No nodules. No JVD.  PULMONARY: Clear to ascultation, without wheeze rails or rhonci. No use of accessory muscles, Good respiratory effort. good air entry bilaterally CHEST: Nontender to palpation.  CARDIOVASCULAR: S1 and S2. Regular rate and rhythm. No murmurs, rubs, or gallops. No edema. Pedal pulses 2+ bilaterally.  GASTROINTESTINAL: Soft, nontender, nondistended. No masses. Positive bowel sounds. No hepatosplenomegaly.  MUSCULOSKELETAL: No swelling, clubbing, or  edema. Range of motion full in all extremities.  NEUROLOGIC: Cranial nerves II through XII are intact. No gross focal neurological deficits. Sensation intact. Reflexes intact.  SKIN: No ulceration, lesions, rashes, or cyanosis. Skin warm and dry. Turgor intact.  PSYCHIATRIC: Mood, affect within normal limits. The patient is awake, alert and oriented x 3. Insight, judgment intact.    LABORATORY PANEL:   CBC  Recent Labs Lab 01/19/15 2128  WBC 9.7  HGB 16.0  HCT 46.0  PLT 119*   ------------------------------------------------------------------------------------------------------------------  Chemistries   Recent Labs Lab 01/19/15 2208  NA 132*  K 4.3  CL 99*  CO2 23  GLUCOSE 330*  BUN 19  CREATININE 0.78  CALCIUM 8.6*   ------------------------------------------------------------------------------------------------------------------  Cardiac Enzymes  Recent Labs Lab 01/19/15 2208  TROPONINI <0.03   ------------------------------------------------------------------------------------------------------------------  RADIOLOGY:  Dg Chest 1 View  01/19/2015  CLINICAL DATA:  Left-sided chest pain tonight. EXAM: CHEST 1 VIEW COMPARISON:  None. FINDINGS: The cardiac silhouette, mediastinal and hilar contours are normal. The lungs are clear. No pleural effusion. The bony thorax is intact. IMPRESSION: No acute cardiopulmonary findings. Electronically Signed   By: Marijo Sanes M.D.   On: 01/19/2015 21:46    EKG:   Orders placed or performed during the hospital encounter of 01/19/15  . EKG 12-Lead  . EKG 12-Lead  . EKG 12-Lead  . EKG 12-Lead    IMPRESSION AND PLAN:   59 year old Caucasian gentleman significant history of cardiovascular disease presenting with chest pain  1.Chest pain, central: Initiate aspirin and statin therapy, admitted to telemetry, trend cardiac enzymes 3,  if continued elevation will initiate heparin drip ,nitroglycerin when necessary,  morphine when necessary, consult cardiology follows with Gollan 2. Type 2 diabetes non-insulin-requiring hold oral agents at insulin sliding scale with Accu-Cheks 3. Hypothyroidism unspecified Synthroid 4. Hypertension essential: Norvasc, Lopressor 5. Venous thromboembolism prophylactic: Heparin subcutaneous      All the records are reviewed and case discussed with ED provider. Management plans discussed with the patient, family and they are in agreement.  CODE STATUS: Full  TOTAL TIME TAKING CARE OF THIS PATIENT: 35 minutes.    Kayceon Oki,  Karenann Cai.D on 01/19/2015 at 11:51 PM  Between 7am to 6pm - Pager - [phone]  After 6pm: House Pager: - [phone]  [facility]  Office  [phone]  CC: Primary care physician; No primary care provider on file.

## 2015-01-19 NOTE — ED Provider Notes (Signed)
[facility] Emergency Department Provider Note    ____________________________________________  Time seen: 2200  I have reviewed the triage vital signs and the nursing notes.   HISTORY  Chief Complaint Chest Pain   History limited by: Not Limited   HPI Juan [name] is a 59 y.o. male with history of coronary artery disease status post multiple stents presents to the emergency department today because of concerns for chest pain. He states it started this afternoon when he was walking. He describes it as a pain located in the central chest. It did not radiate. It then was intermittent throughout the day. He was associated with some slight shortness of breath. The patient states that it did ease off after he took a nitroglycerin. He denies any recent fevers. Denies any nausea vomiting or abdominal pain.   Past Medical History  Diagnosis Date  . Essential hypertension   . Hyperlipidemia   . Coronary artery disease     a. Dating back to 63 w/ h/o MI.  8 stents;  b. 10/2003 Cath [place]: LM 20d, RI 40p, LAD 30p, 59m, patent stent, D1 50p, LCX 30p, patent mid stent, 90d/diffuse, RCA dominant, 30p, patent stent, 62m/d, EF 44%-->Med Rx.  . Type II diabetes mellitus ([place])   . Mild sleep apnea   . Hypothyroidism     Patient Active Problem List   Diagnosis Date Noted  . Coronary artery disease   . Type II diabetes mellitus ([place])   . Hypothyroidism   . Diabetes mellitus type 2, uncontrolled, with complications ([place]) [id number]  . Eczematous dermatitis of eyelid 08/08/2013  . 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 Hx of partial thyroidectomy 05/23/2012  . CAD (coronary artery disease) 05/21/2012  . Hyperlipidemia 05/21/2012  . Essential hypertension 05/21/2012  . S/P coronary artery stent placement 05/21/2012    Past Surgical History  Procedure Laterality Date  . Thyroidectomy    . Cardiac catheterization  1993    [place]; Dr.Harold; [place]; Dr. Joneen Boers; [employer]  . Cardiac catheterization  1996    ""  . Cardiac catheterization      ''''  . Cardiac catheterization      '''  . Cardiac catheterization      '''  . Cardiac catheterization  2002    '''  . Cardiac catheterization  2006    [facility]; [place]   . Cyst excision      right  . Nasal septum surgery    . Cardiac stent      x 10    Current Outpatient Rx  Name  Route  Sig  Dispense  Refill  . amLODipine (NORVASC) 5 MG tablet      TAKE 1 BY MOUTH DAILY   90 tablet   1     Pt needs to contact office to schedule future appo ...   . aspirin 81 MG tablet   Oral   Take 81 mg by mouth daily.         . clopidogrel (PLAVIX) 75 MG tablet      TAKE 1 BY MOUTH DAILY   90 tablet   3   . folic acid (FOLVITE) [id number] MCG tablet   Oral   Take 400 mcg by mouth daily.         Marland Kitchen glimepiride (AMARYL) 4 MG tablet      TAKE 2 BY MOUTH DAILY BEFOR E BREAKFAST   180 tablet  0   . levothyroxine (SYNTHROID) 100 MCG tablet   Oral   Take 1 tablet (100 mcg total) by mouth daily before breakfast.   90 tablet   0   . metFORMIN (GLUCOPHAGE-XR) 500 MG 24 hr tablet      TAKE 2 BY MOUTH TWICE DAILY   360 tablet   1   . metoprolol tartrate (LOPRESSOR) 50 MG tablet   Oral   Take 1 tablet (50 mg total) by mouth 2 (two) times daily.   180 tablet   3   . Multiple Vitamin (MULTIVITAMIN) tablet   Oral   Take 2 tablets by mouth 2 (two) times daily.          . nitroGLYCERIN (NITROSTAT) 0.4 MG SL tablet   Sublingual   Place 1 tablet (0.4 mg total) under the tongue every 5 (five) minutes as needed for chest pain.   10 tablet   3   . Omega-3 Fatty Acids (FISH OIL PO)   Oral   Take 900 mg by mouth 2 (two) times daily.          . ramipril (ALTACE) 5 MG capsule      TAKE 1 BY MOUTH DAILY   90 capsule   3   . simvastatin (ZOCOR) 40 MG tablet      TAKE 1 BY MOUTH DAILY   90 tablet   0      Allergies Review of patient's allergies indicates no known allergies.  Family History  Problem Relation Age of Onset  . Heart attack Mother   . Heart attack Father   . Cancer Father 5    early prostate cancer  . Drug abuse Sister   . Cirrhosis Sister     Social History Social History  Substance Use Topics  . Smoking status: Never Smoker   . Smokeless tobacco: Never Used  . Alcohol Use: 0.5 oz/week    1 Standard drinks or equivalent per week     Comment: occassional    Review of Systems  Constitutional: Negative for fever. Cardiovascular: Positive for chest pain. Respiratory: Positive for shortness of breath. Gastrointestinal: Negative for abdominal pain, vomiting and diarrhea. Genitourinary: Negative for dysuria. Musculoskeletal: Negative for back pain. Skin: Negative for rash. Neurological: Negative for headaches, focal weakness or numbness.  10-point ROS otherwise negative.  ____________________________________________   PHYSICAL EXAM:  VITAL SIGNS: ED Triage Vitals  Enc Vitals Group     BP 01/19/15 2121 155/88 mmHg     Pulse Rate 01/19/15 2121 108     Resp 01/19/15 2121 14     Temp --      Temp src --      SpO2 01/19/15 2121 98 %     Weight 01/19/15 2110 205 lb (92.987 kg)     Height 01/19/15 2110 5\' 8"  (1.727 m)     Head Cir --      Peak Flow --      Pain Score 01/19/15 2111 7   Constitutional: Alert and oriented. Well appearing and in no distress. Eyes: Conjunctivae are normal. PERRL. Normal extraocular movements. ENT   Head: Normocephalic and atraumatic.   Nose: No congestion/rhinnorhea.   Mouth/Throat: Mucous membranes are moist.   Neck: No stridor. Hematological/Lymphatic/Immunilogical: No cervical lymphadenopathy. Cardiovascular: Normal rate, regular rhythm.  Grade 1/6 systolic murmur Respiratory: Normal respiratory effort without tachypnea nor retractions. Breath sounds are clear and equal bilaterally. No  wheezes/rales/rhonchi. Gastrointestinal: Soft and nontender. No distention. Genitourinary: Deferred Musculoskeletal: Normal range of  motion in all extremities. No joint effusions.  No lower extremity tenderness nor edema. Neurologic:  Normal speech and language. No gross focal neurologic deficits are appreciated.  Skin:  Skin is warm, dry and intact. No rash noted. Psychiatric: Mood and affect are normal. Speech and behavior are normal. Patient exhibits appropriate insight and judgment.  ____________________________________________    LABS (pertinent positives/negatives)  Labs Reviewed  CBC WITH DIFFERENTIAL/PLATELET - Abnormal; Notable for the following:    Platelets 119 (*)    Neutro Abs 7.6 (*)    All other components within normal limits  BASIC METABOLIC PANEL - Abnormal; Notable for the following:    Sodium 132 (*)    Chloride 99 (*)    Glucose, Bld 330 (*)    Calcium 8.6 (*)    All other components within normal limits  TROPONIN I     ____________________________________________   EKG  I, Nance Pear, attending physician, personally viewed and interpreted this EKG  EKG Time: 2113 Rate: 108 Rhythm: sinus tachycardia Axis: normal Intervals: qtc 477 QRS: narrow ST changes: no st elevation Impression: abnormal ekg ____________________________________________    RADIOLOGY  CXR  IMPRESSION: No acute cardiopulmonary findings.   ____________________________________________   PROCEDURES  Procedure(s) performed: None  Critical Care performed: No  ____________________________________________   INITIAL IMPRESSION / ASSESSMENT AND PLAN / ED COURSE  Pertinent labs & imaging results that were available during my care of the patient were reviewed by me and considered in my medical decision making (see chart for details).  Patient presented to the emergency department today because of concerns for chest pain. Patient has a significant coronary artery  disease history. First troponin negative. EKG without any ST elevation. Given that patient has high risk will plan on admission to hospital service.  ____________________________________________   FINAL CLINICAL IMPRESSION(S) / ED DIAGNOSES  Final diagnoses:  Chest pain, unspecified chest pain type     Nance Pear, MD 01/19/15 2255

## 2015-01-20 DIAGNOSIS — I25110 Atherosclerotic heart disease of native coronary artery with unstable angina pectoris: Secondary | ICD-10-CM | POA: Diagnosis not present

## 2015-01-20 DIAGNOSIS — I200 Unstable angina: Secondary | ICD-10-CM

## 2015-01-20 LAB — CBC
HEMATOCRIT: 43.3 % (ref 40.0–52.0)
HEMOGLOBIN: 14.9 g/dL (ref 13.0–18.0)
MCH: 31.7 pg (ref 26.0–34.0)
MCHC: 34.5 g/dL (ref 32.0–36.0)
MCV: 92.1 fL (ref 80.0–100.0)
Platelets: 100 10*3/uL — ABNORMAL LOW (ref 150–440)
RBC: 4.7 MIL/uL (ref 4.40–5.90)
RDW: 13.7 % (ref 11.5–14.5)
WBC: 6.3 10*3/uL (ref 3.8–10.6)

## 2015-01-20 LAB — BASIC METABOLIC PANEL
ANION GAP: 8 (ref 5–15)
BUN: 17 mg/dL (ref 6–20)
CO2: 26 mmol/L (ref 22–32)
Calcium: 8.7 mg/dL — ABNORMAL LOW (ref 8.9–10.3)
Chloride: 100 mmol/L — ABNORMAL LOW (ref 101–111)
Creatinine, Ser: 0.89 mg/dL (ref 0.61–1.24)
GFR calc Af Amer: >60 mL/min (ref >60)
GFR calc non Af Amer: >60 mL/min (ref >60)
GLUCOSE: 365 mg/dL — AB (ref 65–99)
POTASSIUM: 4.3 mmol/L (ref 3.5–5.1)
Sodium: 134 mmol/L — ABNORMAL LOW (ref 135–145)

## 2015-01-20 LAB — GLUCOSE, CAPILLARY
GLUCOSE-CAPILLARY: 290 mg/dL — AB (ref 65–99)
GLUCOSE-CAPILLARY: 319 mg/dL — AB (ref 65–99)
Glucose-Capillary: 290 mg/dL — ABNORMAL HIGH (ref 65–99)
Glucose-Capillary: 354 mg/dL — ABNORMAL HIGH (ref 65–99)

## 2015-01-20 LAB — HEMOGLOBIN A1C: HEMOGLOBIN A1C: 9.8 % — AB (ref 4.0–6.0)

## 2015-01-20 LAB — TROPONIN I
TROPONIN I: 0.04 ng/mL — AB (ref <0.031)
TROPONIN I: 0.03 ng/mL (ref <0.031)
Troponin I: 0.03 ng/mL (ref <0.031)

## 2015-01-20 MED ORDER — LEVOTHYROXINE SODIUM 100 MCG PO TABS
100.0000 ug | ORAL_TABLET | Freq: Every day | ORAL | Status: DC
  Administered 2015-01-20: 100 ug via ORAL
  Filled 2015-01-20: qty 1

## 2015-01-20 MED ORDER — ASPIRIN 81 MG PO CHEW
81.0000 mg | CHEWABLE_TABLET | ORAL | Status: AC
  Administered 2015-01-21: 81 mg via ORAL
  Filled 2015-01-20: qty 1

## 2015-01-20 MED ORDER — ADULT MULTIVITAMIN W/MINERALS CH
1.0000 | ORAL_TABLET | Freq: Two times a day (BID) | ORAL | Status: DC
  Administered 2015-01-20 – 2015-01-21 (×3): 1 via ORAL
  Filled 2015-01-20 (×3): qty 1

## 2015-01-20 MED ORDER — SIMVASTATIN 40 MG PO TABS: 40.0000 mg | ORAL_TABLET | Freq: Every day | ORAL | Status: DC

## 2015-01-20 MED ORDER — NITROGLYCERIN 0.4 MG SL SUBL: 0.4000 mg | SUBLINGUAL_TABLET | SUBLINGUAL | Status: DC | PRN

## 2015-01-20 MED ORDER — RAMIPRIL 5 MG PO CAPS
5.0000 mg | ORAL_CAPSULE | Freq: Every day | ORAL | Status: DC
  Administered 2015-01-20 – 2015-01-21 (×2): 5 mg via ORAL
  Filled 2015-01-20 (×2): qty 1

## 2015-01-20 MED ORDER — INSULIN ASPART 100 UNIT/ML ~~LOC~~ SOLN
0.0000 [IU] | Freq: Every day | SUBCUTANEOUS | Status: DC
  Administered 2015-01-20: 3 [IU] via SUBCUTANEOUS
  Administered 2015-01-20: 5 [IU] via SUBCUTANEOUS
  Filled 2015-01-20: qty 3
  Filled 2015-01-20: qty 5

## 2015-01-20 MED ORDER — SIMVASTATIN 40 MG PO TABS
40.0000 mg | ORAL_TABLET | Freq: Every day | ORAL | Status: DC
  Administered 2015-01-20: 40 mg via ORAL
  Filled 2015-01-20: qty 1

## 2015-01-20 MED ORDER — INSULIN ASPART 100 UNIT/ML ~~LOC~~ SOLN
0.0000 [IU] | Freq: Three times a day (TID) | SUBCUTANEOUS | Status: DC
  Administered 2015-01-20: 7 [IU] via SUBCUTANEOUS
  Administered 2015-01-20: 3 [IU] via SUBCUTANEOUS
  Administered 2015-01-21 (×2): 7 [IU] via SUBCUTANEOUS
  Administered 2015-01-21: 5 [IU] via SUBCUTANEOUS
  Filled 2015-01-20 (×2): qty 7
  Filled 2015-01-20: qty 5
  Filled 2015-01-20: qty 3
  Filled 2015-01-20: qty 5
  Filled 2015-01-20: qty 7

## 2015-01-20 MED ORDER — INSULIN GLARGINE 100 UNIT/ML ~~LOC~~ SOLN
12.0000 [IU] | Freq: Every day | SUBCUTANEOUS | Status: DC
  Administered 2015-01-20: 12 [IU] via SUBCUTANEOUS
  Filled 2015-01-20 (×2): qty 0.12

## 2015-01-20 MED ORDER — ATORVASTATIN CALCIUM 20 MG PO TABS
40.0000 mg | ORAL_TABLET | Freq: Every day | ORAL | Status: DC
  Filled 2015-01-20: qty 2

## 2015-01-20 MED ORDER — METOPROLOL TARTRATE 50 MG PO TABS
50.0000 mg | ORAL_TABLET | Freq: Two times a day (BID) | ORAL | Status: DC
  Administered 2015-01-20 – 2015-01-21 (×3): 50 mg via ORAL
  Filled 2015-01-20 (×3): qty 1

## 2015-01-20 MED ORDER — CLOPIDOGREL BISULFATE 75 MG PO TABS
75.0000 mg | ORAL_TABLET | Freq: Every day | ORAL | Status: DC
  Administered 2015-01-20: 75 mg via ORAL
  Filled 2015-01-20: qty 1

## 2015-01-20 MED ORDER — AMLODIPINE BESYLATE 5 MG PO TABS
5.0000 mg | ORAL_TABLET | Freq: Every day | ORAL | Status: DC
  Administered 2015-01-20 – 2015-01-21 (×2): 5 mg via ORAL
  Filled 2015-01-20 (×2): qty 1

## 2015-01-20 MED ORDER — ASPIRIN EC 81 MG PO TBEC
81.0000 mg | DELAYED_RELEASE_TABLET | Freq: Every day | ORAL | Status: DC
  Administered 2015-01-20: 81 mg via ORAL
  Filled 2015-01-20: qty 1

## 2015-01-20 MED ORDER — FOLIC ACID 1 MG PO TABS
500.0000 ug | ORAL_TABLET | Freq: Every day | ORAL | Status: DC
  Administered 2015-01-20 – 2015-01-21 (×2): 0.5 mg via ORAL
  Filled 2015-01-20 (×3): qty 1

## 2015-01-20 NOTE — Progress Notes (Signed)
Patient stated that he cannot take lipitor due to elevated liver enzymes. Patient normally take zocor, per Dr. Posey Pronto okay to change order back to zocor. Juan [name]

## 2015-01-20 NOTE — Consult Note (Signed)
Cardiology Consultation Note  Patient ID: Juan [name], MRN: [id number], DOB/AGE: 1955-11-01 59 y.o. Admit date: 01/19/2015   Date of Consult: 01/20/2015 Primary Physician: No primary care provider on file. Primary Cardiologist: Dr. Rockey Situ, MD  Chief Complaint: Chest pain Reason for Consult: Unstable angina  HPI: 59 y/o male with the below complex PMH. He has a h/o CAD and is s/p multiple PCI's. His last cath was in 2005 in [place] and revealed patent LAD, LCX, and RCA stents. He was last seen in clinic in on 01/07/2015 @ which time he reported that he was experiencing exertional chest pain. He did have one episode of c/p in the summer of 2015, but otw was well. Since then, he has noted intermittent exertional chest discomfort. He now says that it has been intermittent since the summer of 2015. Though it only occurs with fairly heavy exertion, he is unable to predict when it might occur as the same activity two days in a row may not elicit the same symptoms. He was not interested in any ischemic evaluation at that visit (stress testing or cardiac cath). He reported a prior false negative stress test in the past.   He presented to [facility] [place] [facility] on 12/20 after developing substernal chest pain while walking down a hall at work on 12/20. Pain lasted the entire afternoon into the evening hours before self resolving with a SL NTG. After eating dinner and going upstairs to get ready for bed the pain returned prompting him to come to [facility] for further evaluation. Pain was associated with SOB, diaphoresis, nausea, and clamminess. Pain radiated to the left arm. Pain did not feel like his prior MI which was associated with reflux but he reports "something is not right."  Upon the patient's arrival to [facility] they were found to have troponin 0.03-->0.04-->0.03, glucose 330-->365, SCr 0.78, PLT 119-->100. ECG showed sinus tachycardia, 108 bpm, inferior Q waves, no significant st/t changes, CXR showed no acute  cardiopulmonary findings. He is currently without chest pain.    Past Medical History  Diagnosis Date  . Essential hypertension   . Hyperlipidemia   . Coronary artery disease     a. Dating back to 9 w/ h/o MI.  8 stents;  b. 10/2003 Cath [place]: LM 20d, RI 40p, LAD 30p, 62m, patent stent, D1 50p, LCX 30p, patent mid stent, 90d/diffuse, RCA dominant, 30p, patent stent, 11m/d, EF 44%-->Med Rx.  . Type II diabetes mellitus ([place])   . Mild sleep apnea   . Hypothyroidism       Most Recent Cardiac Studies: Echo 01/08/2015: Study Conclusions  - Left ventricle: The cavity size was normal. Systolic function was normal. The estimated ejection fraction was in the range of 50% to 55%. Wall motion was normal; there were no regional wall motion abnormalities. Doppler parameters are consistent with abnormal left ventricular relaxation (grade 1 diastolic dysfunction). - Aortic valve: Sclerosis without stenosis. - Left atrium: The atrium was mildly dilated.   Surgical History:  Past Surgical History  Procedure Laterality Date  . Thyroidectomy    . Cardiac catheterization  1993    [place]; Dr.Harold; [place]; Dr. Joneen Boers; [employer]  . Cardiac catheterization  1996    ""  . Cardiac catheterization      ''''  . Cardiac catheterization      '''  . Cardiac catheterization      '''  . Cardiac catheterization  2002    '''  .  Cardiac catheterization  2006    [facility]; [place]   . Cyst excision      right  . Nasal septum surgery    . Cardiac stent      x 10     Home Meds: Prior to Admission medications   Medication Sig Start Date End Date Taking? Authorizing Provider  amLODipine (NORVASC) 5 MG tablet TAKE 1 BY MOUTH DAILY 09/07/14  Yes Minna Merritts, MD  aspirin 81 MG tablet Take 81 mg by mouth daily.   Yes Historical Provider, MD  clopidogrel (PLAVIX) 75 MG tablet TAKE 1 BY MOUTH DAILY 10/12/14  Yes Minna Merritts, MD  folic acid (FOLVITE) [id number] MCG tablet Take 400 mcg by mouth daily.   Yes Historical Provider, MD  glimepiride (AMARYL) 4 MG tablet TAKE 2 BY MOUTH DAILY BEFOR E BREAKFAST 09/08/14  Yes Amy Cletis [place], MD  levothyroxine (SYNTHROID) 100 MCG tablet Take 1 tablet (100 mcg total) by mouth daily before breakfast. 09/20/14  Yes Amy E Bedsole, MD  metFORMIN (GLUCOPHAGE-XR) 500 MG 24 hr tablet TAKE 2 BY MOUTH TWICE DAILY 04/23/14  Yes Amy Cletis [place], MD  metoprolol tartrate (LOPRESSOR) 50 MG tablet Take 1 tablet (50 mg total) by mouth 2 (two) times daily. 01/07/15  Yes Rogelia Mire, NP  Multiple Vitamin (MULTIVITAMIN) tablet Take 1 tablet by mouth 2 (two) times daily.    Yes Historical Provider, MD  nitroGLYCERIN (NITROSTAT) 0.4 MG SL tablet Place 1 tablet (0.4 mg total) under the tongue every 5 (five) minutes as needed for chest pain. 11/14/13  Yes Amy Cletis [place], MD  Omega-3 Fatty Acids (FISH OIL PO) Take 1,800 mg by mouth daily.    Yes Historical Provider, MD  ramipril (ALTACE) 5 MG capsule TAKE 1 BY MOUTH DAILY 06/05/14  Yes Minna Merritts, MD  simvastatin (ZOCOR) 40 MG tablet TAKE 1 BY MOUTH DAILY 12/07/14  Yes Minna Merritts, MD    Inpatient Medications:  . amLODipine  5 mg Oral Daily  . aspirin EC  81 mg Oral Daily  . clopidogrel  75 mg Oral Daily  . folic acid  [id number] mcg Oral Daily  . heparin  5,000 Units Subcutaneous 3 times per day  . insulin aspart  0-5 Units Subcutaneous QHS  . insulin aspart  0-9 Units Subcutaneous TID WC  . levothyroxine  100 mcg Oral QAC breakfast  . metoprolol tartrate  50 mg Oral BID  . multivitamin with minerals  1 tablet Oral BID  . ramipril  5 mg Oral Daily  . simvastatin  40 mg Oral q1800  . sodium chloride  3 mL Intravenous Q12H      Allergies: No Known Allergies  Social History   Social History  . Marital Status: Married    Spouse Name: N/A  . Number of Children: N/A  . Years of Education: N/A   Occupational History  . Not on file.    Social History Main Topics  . Smoking status: Never Smoker   . Smokeless tobacco: Never Used  . Alcohol Use: 0.5 oz/week    1 Standard drinks or equivalent per week     Comment: occassional  . Drug Use: No  . Sexual Activity: Not on file   Other Topics Concern  . Not on file   Social History Narrative   Married 35 years.   Exercise: walks 3-4 times a week   Diet: moderate     Family History  Problem Relation  Age of Onset  . Heart attack Mother   . Heart attack Father   . Cancer Father 44    early prostate cancer  . Drug abuse Sister   . Cirrhosis Sister      Review of Systems: Review of Systems  Constitutional: Positive for weight loss, malaise/fatigue and diaphoresis. Negative for fever and chills.       15 pound weight loss in 2 months - unintentionally   HENT: Negative for congestion.   Eyes: Negative for discharge and redness.  Respiratory: Positive for shortness of breath. Negative for cough, hemoptysis, sputum production and wheezing.   Cardiovascular: Positive for chest pain. Negative for palpitations, orthopnea, claudication, leg swelling and PND.  Gastrointestinal: Positive for nausea. Negative for heartburn, vomiting and abdominal pain.  Musculoskeletal: Negative for falls.  Skin: Negative for rash.  Neurological: Positive for weakness. Negative for dizziness, tingling, tremors, sensory change, speech change, focal weakness and loss of consciousness.  Endo/Heme/Allergies: Does not bruise/bleed easily.  Psychiatric/Behavioral: Negative for substance abuse. The patient is not nervous/anxious.   All other systems reviewed and are negative.    Labs:  Recent Labs  01/19/15 2208 01/20/15 0415 01/20/15 1008  TROPONINI <0.03 0.04* 0.03   Lab Results  Component Value Date   WBC 6.3 01/20/2015   HGB 14.9 01/20/2015   HCT 43.3 01/20/2015   MCV 92.1 01/20/2015   PLT 100* 01/20/2015    Recent Labs Lab 01/20/15 0415  NA 134*  K 4.3  CL 100*  CO2 26   BUN 17  CREATININE 0.89  CALCIUM 8.7*  GLUCOSE 365*   Lab Results  Component Value Date   CHOL 197 02/16/2014   HDL 34.10* 02/16/2014   LDLCALC 61 04/11/2013   TRIG 402.0* 02/16/2014   No results found for: DDIMER  Radiology/Studies:  Dg Chest 1 View  01/19/2015  CLINICAL DATA:  Left-sided chest pain tonight. EXAM: CHEST 1 VIEW COMPARISON:  None. FINDINGS: The cardiac silhouette, mediastinal and hilar contours are normal. The lungs are clear. No pleural effusion. The bony thorax is intact. IMPRESSION: No acute cardiopulmonary findings. Electronically Signed   By: Marijo Sanes M.D.   On: 01/19/2015 21:46    EKG: sinus tachycardia, 108 bpm, inferior Q waves, no significant st/t changes  Weights: [facility] Weights   01/19/15 2110 01/20/15 0115  Weight: 205 lb (92.987 kg) 203 lb 11.2 oz (92.398 kg)     Physical Exam: Blood pressure 134/75, pulse 84, temperature 98.4 F (36.9 C), temperature source Oral, resp. rate 18, height 5\' 8"  (1.727 m), weight 203 lb 11.2 oz (92.398 kg), SpO2 97 %. Body mass index is 30.98 kg/(m^2). General: Well developed, well nourished, in no acute distress. Head: Normocephalic, atraumatic, sclera non-icteric, no xanthomas, nares are without discharge.  Neck: Negative for carotid bruits. JVD not elevated. Lungs: Clear bilaterally to auscultation without wheezes, rales, or rhonchi. Breathing is unlabored. Heart: RRR with S1 S2. No murmurs, rubs, or gallops appreciated. Abdomen: Soft, non-tender, non-distended with normoactive bowel sounds. No hepatomegaly. No rebound/guarding. No obvious abdominal masses. Msk:  Strength and tone appear normal for age. Extremities: No clubbing or cyanosis. No edema.  Distal pedal pulses are 2+ and equal bilaterally. Neuro: Alert and oriented X 3. No facial asymmetry. No focal deficit. Moves all extremities spontaneously. Psych:  Responds to questions appropriately with a normal affect.    Assessment and Plan:   1.  Unstable angina/CAD s/p multiple PCI as above: -Currently without chest pain. Patient reports symptoms are not  similar to his prior MI, but "something is not right" -He prefers ischemic evaluation with cardiac catheterization over nuclear stress test as he has previously had false negative stress testing. Will discuss with MD prior to scheduling cardiac catheterization  -No indication for heparin gtt at this time given negative troponin  -Continue aspirin and Plavix with close monitoring of his PLT count (no prior PLT to compare too) -Continue Lopressor 50 mg bid, ramipril 5 mg daily, simvastatin 40 mg qhs -Recent echo 12/2014 as above  2. Thrombocytopenia: -Uncertain chronicity  -Perhaps 2/2 DAPT -Reports 15 pound unintentional weight loss over 2 month period, remaining blood lines stable -Per primary team/PCP  3. DM2: -Uncontrolled -Per IM  4. HTN: -Improved -Meds as above  5. HLD: -Simvastatin   Signed, Christell Faith, PA-C Pager: [phone] 01/20/2015, 11:15 AM

## 2015-01-20 NOTE — Progress Notes (Signed)
MD, Dr. Marcille Blanco notified of second troponin, 0.04.  MD aware--No new orders. Juan [name]

## 2015-01-20 NOTE — Progress Notes (Signed)
[facility] [place] NAME: Juan [name]    MR#:  [id number]  DATE OF BIRTH:  [birth year]  SUBJECTIVE:  CHIEF COMPLAINT:   Chief Complaint  Patient presents with  . Chest Pain   -Patient with prior history of coronary artery disease with 3 vessel PCI in the past presents to the hospital secondary to unstable angina symptoms. -Chest pain is resolved at this time. Troponins are negative. But due to his high risk factors and typical nature of presentation, he will be undergoing cardiac catheterization tomorrow. -No other complaints at this time  REVIEW OF SYSTEMS:  Review of Systems  Constitutional: Negative for fever and chills.  HENT: Negative for ear discharge, ear pain and nosebleeds.   Eyes: Negative for blurred vision.  Respiratory: Negative for cough, shortness of breath and wheezing.   Cardiovascular: Positive for chest pain. Negative for palpitations and leg swelling.       Chest pain is resolved now  Gastrointestinal: Negative for nausea, vomiting, abdominal pain, diarrhea and constipation.  Genitourinary: Negative for dysuria.  Musculoskeletal: Negative for myalgias.  Neurological: Negative for dizziness, sensory change, speech change, focal weakness, seizures, weakness and headaches.  Psychiatric/Behavioral: Negative for depression.    DRUG ALLERGIES:  No Known Allergies  VITALS:  Blood pressure 125/69, pulse 74, temperature 98 F (36.7 C), temperature source Oral, resp. rate 20, height 5\' 8"  (1.727 m), weight 92.398 kg (203 lb 11.2 oz), SpO2 95 %.  PHYSICAL EXAMINATION:  Physical Exam  GENERAL:  59 y.o.-year-old patient lying in the bed with no acute distress.  EYES: Pupils equal, round, reactive to light and accommodation. No scleral icterus. Extraocular muscles intact.  HEENT: Head atraumatic, normocephalic. Oropharynx and nasopharynx clear.  NECK:  Supple, no jugular venous distention. No thyroid enlargement,  no tenderness.  LUNGS: Normal breath sounds bilaterally, no wheezing, rales,rhonchi or crepitation. No use of accessory muscles of respiration.  CARDIOVASCULAR: S1, S2 normal. No murmurs, rubs, or gallops.  ABDOMEN: Soft, nontender, nondistended. Bowel sounds present. No organomegaly or mass.  EXTREMITIES: No pedal edema, cyanosis, or clubbing.  NEUROLOGIC: Cranial nerves II through XII are intact. Muscle strength 5/5 in all extremities. Sensation intact. Gait not checked.  PSYCHIATRIC: The patient is alert and oriented x 3.  SKIN: No obvious rash, lesion, or ulcer.    LABORATORY PANEL:   CBC  Recent Labs Lab 01/20/15 0415  WBC 6.3  HGB 14.9  HCT 43.3  PLT 100*   ------------------------------------------------------------------------------------------------------------------  Chemistries   Recent Labs Lab 01/20/15 0415  NA 134*  K 4.3  CL 100*  CO2 26  GLUCOSE 365*  BUN 17  CREATININE 0.89  CALCIUM 8.7*   ------------------------------------------------------------------------------------------------------------------  Cardiac Enzymes  Recent Labs Lab 01/20/15 1301  TROPONINI 0.03   ------------------------------------------------------------------------------------------------------------------  RADIOLOGY:  Dg Chest 1 View  01/19/2015  CLINICAL DATA:  Left-sided chest pain tonight. EXAM: CHEST 1 VIEW COMPARISON:  None. FINDINGS: The cardiac silhouette, mediastinal and hilar contours are normal. The lungs are clear. No pleural effusion. The bony thorax is intact. IMPRESSION: No acute cardiopulmonary findings. Electronically Signed   By: Marijo Sanes M.D.   On: 01/19/2015 21:46    EKG:   Orders placed or performed during the hospital encounter of 01/19/15  . EKG 12-Lead  . EKG 12-Lead  . EKG 12-Lead  . EKG 12-Lead    ASSESSMENT AND PLAN:   59 year old male with past medical history significant for CAD status post multiple PCI's, hypertension,  hyperlipidemia, diabetes mellitus and hypothyroidism presents to the hospital secondary to worsening chest pain.  #1 unstable angina-prior cardiac history. -Cardiac consult is appreciated. For cardiac catheterization tomorrow. -Since troponins are negative, no indication for heparin drip at this time. -Continue aspirin, Plavix, metoprolol, ACE inhibitor and statin -Recent echocardiogram from 2 weeks ago showing normal left ventricle with EF of 55%, no wall motion abnormalities noted. Mild diastolic dysfunction is noted.  #2 CAD status post stents-management as mentioned above.  #3 hypertension-on Norvasc, metoprolol and ramipril.  #4 diabetes mellitus with elevated blood sugars-check A1c. Metformin is on hold for cardiac catheterization tomorrow. -Amaryl is also on hold now. We'll give Lantus tonight. Continue sliding scale insulin.  #5 hypothyroidism-continue Synthroid  #6 DVT prophylaxis-on subcutaneous heparin    All the records are reviewed and case discussed with Care Management/Social Workerr. Management plans discussed with the patient, family and they are in agreement.  CODE STATUS: Full Code  TOTAL TIME TAKING CARE OF THIS PATIENT: 37 minutes.   POSSIBLE D/C IN 1-2 DAYS, DEPENDING ON CLINICAL CONDITION.   Gladstone Lighter M.D on 01/20/2015 at 4:54 PM  Between 7am to 6pm - Pager - [phone]  After 6pm go to [email or web address] - password [email or web address]  [facility]  Office  [phone]  CC: Primary care physician; No primary care provider on file.

## 2015-01-21 ENCOUNTER — Encounter: Payer: Self-pay | Admitting: Cardiovascular Disease

## 2015-01-21 ENCOUNTER — Encounter
Admission: EM | Disposition: A | Discharge status: Home / Self Care | Payer: Self-pay | Source: Home / Self Care | Attending: Emergency Medicine

## 2015-01-21 DIAGNOSIS — I25110 Atherosclerotic heart disease of native coronary artery with unstable angina pectoris: Secondary | ICD-10-CM

## 2015-01-21 DIAGNOSIS — I200 Unstable angina: Secondary | ICD-10-CM | POA: Insufficient documentation

## 2015-01-21 HISTORY — PX: CARDIAC CATHETERIZATION: SHX172

## 2015-01-21 LAB — CBC
HCT: 46.4 % (ref 40.0–52.0)
Hemoglobin: 15.9 g/dL (ref 13.0–18.0)
MCH: 31.6 pg (ref 26.0–34.0)
MCHC: 34.3 g/dL (ref 32.0–36.0)
MCV: 92.2 fL (ref 80.0–100.0)
PLATELETS: 116 10*3/uL — AB (ref 150–440)
RBC: 5.03 MIL/uL (ref 4.40–5.90)
RDW: 13.7 % (ref 11.5–14.5)
WBC: 4.4 10*3/uL (ref 3.8–10.6)

## 2015-01-21 LAB — GLUCOSE, CAPILLARY
GLUCOSE-CAPILLARY: 306 mg/dL — AB (ref 65–99)
GLUCOSE-CAPILLARY: 337 mg/dL — AB (ref 65–99)
Glucose-Capillary: 278 mg/dL — ABNORMAL HIGH (ref 65–99)

## 2015-01-21 LAB — BASIC METABOLIC PANEL
Anion gap: 11 (ref 5–15)
BUN: 17 mg/dL (ref 6–20)
CALCIUM: 9.1 mg/dL (ref 8.9–10.3)
CO2: 26 mmol/L (ref 22–32)
CREATININE: 0.71 mg/dL (ref 0.61–1.24)
Chloride: 98 mmol/L — ABNORMAL LOW (ref 101–111)
GFR calc non Af Amer: >60 mL/min (ref >60)
GLUCOSE: 320 mg/dL — AB (ref 65–99)
Potassium: 4.2 mmol/L (ref 3.5–5.1)
Sodium: 135 mmol/L (ref 135–145)

## 2015-01-21 LAB — HEMOGLOBIN A1C: Hgb A1c MFr Bld: 9.7 % — ABNORMAL HIGH (ref 4.0–6.0)

## 2015-01-21 LAB — PROTIME-INR
INR: 1.12
PROTHROMBIN TIME: 14.6 s (ref 11.4–15.0)

## 2015-01-21 SURGERY — LEFT HEART CATH AND CORONARY ANGIOGRAPHY
Anesthesia: Moderate Sedation

## 2015-01-21 MED ORDER — MIDAZOLAM HCL 2 MG/2ML IJ SOLN
INTRAMUSCULAR | Status: AC
  Filled 2015-01-21: qty 2

## 2015-01-21 MED ORDER — HEPARIN (PORCINE) IN NACL 2-0.9 UNIT/ML-% IJ SOLN
INTRAMUSCULAR | Status: AC
  Filled 2015-01-21: qty 1000

## 2015-01-21 MED ORDER — SODIUM CHLORIDE 0.9 % IJ SOLN: 3.0000 mL | INTRAMUSCULAR | Status: DC | PRN

## 2015-01-21 MED ORDER — SODIUM CHLORIDE 0.9 % WEIGHT BASED INFUSION
1.0000 mL/kg/h | INTRAVENOUS | Status: DC
  Administered 2015-01-21: 1 mL/kg/h via INTRAVENOUS

## 2015-01-21 MED ORDER — HEPARIN SODIUM (PORCINE) 1000 UNIT/ML IJ SOLN
INTRAMUSCULAR | Status: DC | PRN
  Administered 2015-01-21: 5000 [IU] via INTRAVENOUS

## 2015-01-21 MED ORDER — INSULIN GLARGINE 100 UNIT/ML ~~LOC~~ SOLN
20.0000 [IU] | Freq: Every day | SUBCUTANEOUS | Status: DC
  Filled 2015-01-21: qty 0.2

## 2015-01-21 MED ORDER — SODIUM CHLORIDE 0.9 % IV SOLN: 250.0000 mL | INTRAVENOUS | Status: DC | PRN

## 2015-01-21 MED ORDER — VERAPAMIL HCL 2.5 MG/ML IV SOLN
INTRAVENOUS | Status: DC | PRN
  Administered 2015-01-21: 2.5 mg via INTRA_ARTERIAL

## 2015-01-21 MED ORDER — SODIUM CHLORIDE 0.9 % IJ SOLN: 3.0000 mL | Freq: Two times a day (BID) | INTRAMUSCULAR | Status: DC

## 2015-01-21 MED ORDER — FENTANYL CITRATE (PF) 100 MCG/2ML IJ SOLN
INTRAMUSCULAR | Status: AC
  Filled 2015-01-21: qty 2

## 2015-01-21 MED ORDER — ATORVASTATIN CALCIUM 20 MG PO TABS: 20.0000 mg | ORAL_TABLET | Freq: Every day | ORAL | Status: DC

## 2015-01-21 MED ORDER — MIDAZOLAM HCL 2 MG/2ML IJ SOLN
INTRAMUSCULAR | Status: DC | PRN
  Administered 2015-01-21: 0.5 mg via INTRAVENOUS
  Administered 2015-01-21: 1 mg via INTRAVENOUS

## 2015-01-21 MED ORDER — FENTANYL CITRATE (PF) 100 MCG/2ML IJ SOLN
INTRAMUSCULAR | Status: DC | PRN
  Administered 2015-01-21: 25 ug via INTRAVENOUS
  Administered 2015-01-21: 50 ug via INTRAVENOUS

## 2015-01-21 MED ORDER — IOHEXOL 300 MG/ML  SOLN
INTRAMUSCULAR | Status: DC | PRN
  Administered 2015-01-21: 30 mL via INTRA_ARTERIAL
  Administered 2015-01-21: 70 mL via INTRA_ARTERIAL

## 2015-01-21 MED ORDER — LIVING WELL WITH DIABETES BOOK
Freq: Once | Status: AC
  Administered 2015-01-21: 14:00:00
  Filled 2015-01-21: qty 1

## 2015-01-21 MED ORDER — SIMVASTATIN 40 MG PO TABS: 40.0000 mg | ORAL_TABLET | Freq: Every day | ORAL | Status: DC

## 2015-01-21 MED ORDER — SODIUM CHLORIDE 0.9 % WEIGHT BASED INFUSION: 1.0000 mL/kg/h | INTRAVENOUS | Status: AC

## 2015-01-21 MED ORDER — VERAPAMIL HCL 2.5 MG/ML IV SOLN
INTRAVENOUS | Status: AC
  Filled 2015-01-21: qty 2

## 2015-01-21 MED ORDER — HEPARIN SODIUM (PORCINE) 1000 UNIT/ML IJ SOLN
INTRAMUSCULAR | Status: AC
  Filled 2015-01-21: qty 1

## 2015-01-21 MED ORDER — ATORVASTATIN CALCIUM 20 MG PO TABS: 80.0000 mg | ORAL_TABLET | Freq: Every day | ORAL | Status: DC

## 2015-01-21 SURGICAL SUPPLY — 7 items
CATH INFINITI 5FR ANG PIGTAIL (CATHETERS) ×2 IMPLANT
CATH OPTITORQUE JACKY 4.0 5F (CATHETERS) ×2 IMPLANT
DEVICE RAD TR BAND REGULAR (VASCULAR PRODUCTS) ×2 IMPLANT
GLIDESHEATH SLEND SS 6F .021 (SHEATH) ×2 IMPLANT
KIT MANI 3VAL PERCEP (MISCELLANEOUS) ×2 IMPLANT
PACK CARDIAC CATH (CUSTOM PROCEDURE TRAY) ×2 IMPLANT
WIRE SAFE-T 1.5MM-J .035X260CM (WIRE) ×2 IMPLANT

## 2015-01-21 NOTE — Progress Notes (Addendum)
Three IV's attempted- flash back but blew, will reassess later during shift prior to cardiac cath. Jessee Avers

## 2015-01-21 NOTE — Progress Notes (Signed)
Bedside report from Delia Chimes when patient returned from [place]. Right radial site, bruised but soft - per RN he developed palpable hematoma (marked with sharpie) - now soft. Gauze dressing clean, dry and intact. Pt instructed on movement restrictions and to notify nurse of any bleeding. Will continue to monitor closely.

## 2015-01-21 NOTE — Progress Notes (Signed)
Inpatient Diabetes Program Recommendations  AACE/ADA: New Consensus Statement on Inpatient Glycemic Control (2015)  Target Ranges:  Prepandial:   less than 140 mg/dL      Peak postprandial:   less than 180 mg/dL (1-2 hours)      Critically ill patients:  140 - 180 mg/dL   Review of Glycemic Control  Results for Juan [name], [name] (MRN [id number]) as of 01/21/2015 08:17  Ref. Range 01/20/2015 01:20 01/20/2015 11:55 01/20/2015 16:31 01/20/2015 21:17 01/21/2015 08:08  Glucose-Capillary Latest Ref Range: 65-99 mg/dL 290 (H) 290 (H) 319 (H) 354 (H) 278 (H)    Diabetes history: Type 2 Outpatient Diabetes medications: Amaryl 4 mg qam, Glucophage XR 552m 2 qam Current orders for Inpatient glycemic control: Novolog sensitive correction scale 0-9 units tid, Novolog 0-5 qhs, Lantus 12 units qhs  Inpatient Diabetes Program Recommendations: A1C 9.7%- Consider stopping the Amaryl and increasing Novolog correction scale to resistant scale 0-15 units, q4h while he's NPO, then tid and hs once he begins eating. Consider increasing Lantus to 18 units qhs (0.2units/kg).  Given his high A1C and his cardiac history, recommend discharging patient home on Lantus insulin and mealtime Novolog insulin via the insulin pen.  Patient will require teaching using the insulin pen kit and an order for a glucometer if he does not have one at home.   Consider referral to the [facility] for diabetes education .   JGentry Fitz RN, BA, MHA, CDE Diabetes Coordinator Inpatient Diabetes Program  [phone](Team Pager) [phone]([place] 01/21/2015 8:24 AM

## 2015-01-21 NOTE — Interval H&P Note (Signed)
History and Physical Interval Note:  01/21/2015 9:20 AM  Juan [name]  has presented today for surgery, with the diagnosis of unstable angina  The various methods of treatment have been discussed with the patient and family. After consideration of risks, benefits and other options for treatment, the patient has consented to  Procedure(s): Left Heart Cath and Coronary Angiography (N/A) as a surgical intervention .  The patient's history has been reviewed, patient examined, no change in status, stable for surgery.  I have reviewed the patient's chart and labs.  Questions were answered to the patient's satisfaction.     Kathlyn Sacramento

## 2015-01-21 NOTE — Progress Notes (Signed)
Dr. Fletcher Anon saw patient - instructed to proceed with discharge.

## 2015-01-21 NOTE — H&P (View-Only) (Signed)
Cardiology Consultation Note  Patient ID: Juan [name], MRN: [id number], DOB/AGE: [birth year] 58 y.o. Admit date: 01/19/2015   Date of Consult: 01/20/2015 Primary Physician: No primary care provider on file. Primary Cardiologist: Dr. Rockey Situ, MD  Chief Complaint: Chest pain Reason for Consult: Unstable angina  HPI: 59 y/o male with the below complex PMH. He has a h/o CAD and is s/p multiple PCI's. His last cath was in 2005 in [place] and revealed patent LAD, LCX, and RCA stents. He was last seen in clinic in on 01/07/2015 @ which time he reported that he was experiencing exertional chest pain. He did have one episode of c/p in the summer of 2015, but otw was well. Since then, he has noted intermittent exertional chest discomfort. He now says that it has been intermittent since the summer of 2015. Though it only occurs with fairly heavy exertion, he is unable to predict when it might occur as the same activity two days in a row may not elicit the same symptoms. He was not interested in any ischemic evaluation at that visit (stress testing or cardiac cath). He reported a prior false negative stress test in the past.   He presented to [facility] on 12/20 after developing substernal chest pain while walking down a hall at work on 12/20. Pain lasted the entire afternoon into the evening hours before self resolving with a SL NTG. After eating dinner and going upstairs to get ready for bed the pain returned prompting him to come to [facility] for further evaluation. Pain was associated with SOB, diaphoresis, nausea, and clamminess. Pain radiated to the left arm. Pain did not feel like his prior MI which was associated with reflux but he reports "something is not right."  Upon the patient's arrival to [facility] they were found to have troponin 0.03-->0.04-->0.03, glucose 330-->365, SCr 0.78, PLT 119-->100. ECG showed sinus tachycardia, 108 bpm, inferior Q waves, no significant st/t changes, CXR showed no acute  cardiopulmonary findings. He is currently without chest pain.    Past Medical History  Diagnosis Date  . Essential hypertension   . Hyperlipidemia   . Coronary artery disease     a. Dating back to 20 w/ h/o MI.  8 stents;  b. 10/2003 Cath [place]: LM 20d, RI 40p, LAD 30p, 76m, patent stent, D1 50p, LCX 30p, patent mid stent, 90d/diffuse, RCA dominant, 30p, patent stent, 84m/d, EF 44%-->Med Rx.  . Type II diabetes mellitus ([place])   . Mild sleep apnea   . Hypothyroidism       Most Recent Cardiac Studies: Echo 01/08/2015: Study Conclusions  - Left ventricle: The cavity size was normal. Systolic function was normal. The estimated ejection fraction was in the range of 50% to 55%. Wall motion was normal; there were no regional wall motion abnormalities. Doppler parameters are consistent with abnormal left ventricular relaxation (grade 1 diastolic dysfunction). - Aortic valve: Sclerosis without stenosis. - Left atrium: The atrium was mildly dilated.   Surgical History:  Past Surgical History  Procedure Laterality Date  . Thyroidectomy    . Cardiac catheterization  1993    [place]; Dr.Harold; [place]; Dr. Joneen Boers; [facility]  . Cardiac catheterization  1996    ""  . Cardiac catheterization      ''''  . Cardiac catheterization      '''  . Cardiac catheterization      '''  . Cardiac catheterization  2002    '''  .  Cardiac catheterization  2006    [facility]; [place]   . Cyst excision      right  . Nasal septum surgery    . Cardiac stent      x 10     Home Meds: Prior to Admission medications   Medication Sig Start Date End Date Taking? Authorizing Provider  amLODipine (NORVASC) 5 MG tablet TAKE 1 BY MOUTH DAILY 09/07/14  Yes Minna Merritts, MD  aspirin 81 MG tablet Take 81 mg by mouth daily.   Yes Historical Provider, MD  clopidogrel (PLAVIX) 75 MG tablet TAKE 1 BY MOUTH DAILY 10/12/14  Yes Minna Merritts, MD  folic acid (FOLVITE) [id number] MCG tablet Take 400 mcg by mouth daily.   Yes Historical Provider, MD  glimepiride (AMARYL) 4 MG tablet TAKE 2 BY MOUTH DAILY BEFOR E BREAKFAST 09/08/14  Yes Amy Cletis [place], MD  levothyroxine (SYNTHROID) 100 MCG tablet Take 1 tablet (100 mcg total) by mouth daily before breakfast. 09/20/14  Yes Amy E Bedsole, MD  metFORMIN (GLUCOPHAGE-XR) 500 MG 24 hr tablet TAKE 2 BY MOUTH TWICE DAILY 04/23/14  Yes Amy Cletis [place], MD  metoprolol tartrate (LOPRESSOR) 50 MG tablet Take 1 tablet (50 mg total) by mouth 2 (two) times daily. 01/07/15  Yes Rogelia Mire, NP  Multiple Vitamin (MULTIVITAMIN) tablet Take 1 tablet by mouth 2 (two) times daily.    Yes Historical Provider, MD  nitroGLYCERIN (NITROSTAT) 0.4 MG SL tablet Place 1 tablet (0.4 mg total) under the tongue every 5 (five) minutes as needed for chest pain. 11/14/13  Yes Amy Cletis [place], MD  Omega-3 Fatty Acids (FISH OIL PO) Take 1,800 mg by mouth daily.    Yes Historical Provider, MD  ramipril (ALTACE) 5 MG capsule TAKE 1 BY MOUTH DAILY 06/05/14  Yes Minna Merritts, MD  simvastatin (ZOCOR) 40 MG tablet TAKE 1 BY MOUTH DAILY 12/07/14  Yes Minna Merritts, MD    Inpatient Medications:  . amLODipine  5 mg Oral Daily  . aspirin EC  81 mg Oral Daily  . clopidogrel  75 mg Oral Daily  . folic acid  [id number] mcg Oral Daily  . heparin  5,000 Units Subcutaneous 3 times per day  . insulin aspart  0-5 Units Subcutaneous QHS  . insulin aspart  0-9 Units Subcutaneous TID WC  . levothyroxine  100 mcg Oral QAC breakfast  . metoprolol tartrate  50 mg Oral BID  . multivitamin with minerals  1 tablet Oral BID  . ramipril  5 mg Oral Daily  . simvastatin  40 mg Oral q1800  . sodium chloride  3 mL Intravenous Q12H      Allergies: No Known Allergies  Social History   Social History  . Marital Status: Married    Spouse Name: N/A  . Number of Children: N/A  . Years of Education: N/A   Occupational History  . Not on file.    Social History Main Topics  . Smoking status: Never Smoker   . Smokeless tobacco: Never Used  . Alcohol Use: 0.5 oz/week    1 Standard drinks or equivalent per week     Comment: occassional  . Drug Use: No  . Sexual Activity: Not on file   Other Topics Concern  . Not on file   Social History Narrative   Married 35 years.   Exercise: walks 3-4 times a week   Diet: moderate     Family History  Problem Relation  Age of Onset  . Heart attack Mother   . Heart attack Father   . Cancer Father 58    early prostate cancer  . Drug abuse Sister   . Cirrhosis Sister      Review of Systems: Review of Systems  Constitutional: Positive for weight loss, malaise/fatigue and diaphoresis. Negative for fever and chills.       15 pound weight loss in 2 months - unintentionally   HENT: Negative for congestion.   Eyes: Negative for discharge and redness.  Respiratory: Positive for shortness of breath. Negative for cough, hemoptysis, sputum production and wheezing.   Cardiovascular: Positive for chest pain. Negative for palpitations, orthopnea, claudication, leg swelling and PND.  Gastrointestinal: Positive for nausea. Negative for heartburn, vomiting and abdominal pain.  Musculoskeletal: Negative for falls.  Skin: Negative for rash.  Neurological: Positive for weakness. Negative for dizziness, tingling, tremors, sensory change, speech change, focal weakness and loss of consciousness.  Endo/Heme/Allergies: Does not bruise/bleed easily.  Psychiatric/Behavioral: Negative for substance abuse. The patient is not nervous/anxious.   All other systems reviewed and are negative.    Labs:  Recent Labs  01/19/15 2208 01/20/15 0415 01/20/15 1008  TROPONINI <0.03 0.04* 0.03   Lab Results  Component Value Date   WBC 6.3 01/20/2015   HGB 14.9 01/20/2015   HCT 43.3 01/20/2015   MCV 92.1 01/20/2015   PLT 100* 01/20/2015    Recent Labs Lab 01/20/15 0415  NA 134*  K 4.3  CL 100*  CO2 26   BUN 17  CREATININE 0.89  CALCIUM 8.7*  GLUCOSE 365*   Lab Results  Component Value Date   CHOL 197 02/16/2014   HDL 34.10* 02/16/2014   LDLCALC 61 04/11/2013   TRIG 402.0* 02/16/2014   No results found for: DDIMER  Radiology/Studies:  Dg Chest 1 View  01/19/2015  CLINICAL DATA:  Left-sided chest pain tonight. EXAM: CHEST 1 VIEW COMPARISON:  None. FINDINGS: The cardiac silhouette, mediastinal and hilar contours are normal. The lungs are clear. No pleural effusion. The bony thorax is intact. IMPRESSION: No acute cardiopulmonary findings. Electronically Signed   By: Marijo Sanes M.D.   On: 01/19/2015 21:46    EKG: sinus tachycardia, 108 bpm, inferior Q waves, no significant st/t changes  Weights: [facility] Weights   01/19/15 2110 01/20/15 0115  Weight: 205 lb (92.987 kg) 203 lb 11.2 oz (92.398 kg)     Physical Exam: Blood pressure 134/75, pulse 84, temperature 98.4 F (36.9 C), temperature source Oral, resp. rate 18, height 5\' 8"  (1.727 m), weight 203 lb 11.2 oz (92.398 kg), SpO2 97 %. Body mass index is 30.98 kg/(m^2). General: Well developed, well nourished, in no acute distress. Head: Normocephalic, atraumatic, sclera non-icteric, no xanthomas, nares are without discharge.  Neck: Negative for carotid bruits. JVD not elevated. Lungs: Clear bilaterally to auscultation without wheezes, rales, or rhonchi. Breathing is unlabored. Heart: RRR with S1 S2. No murmurs, rubs, or gallops appreciated. Abdomen: Soft, non-tender, non-distended with normoactive bowel sounds. No hepatomegaly. No rebound/guarding. No obvious abdominal masses. Msk:  Strength and tone appear normal for age. Extremities: No clubbing or cyanosis. No edema.  Distal pedal pulses are 2+ and equal bilaterally. Neuro: Alert and oriented X 3. No facial asymmetry. No focal deficit. Moves all extremities spontaneously. Psych:  Responds to questions appropriately with a normal affect.    Assessment and Plan:   1.  Unstable angina/CAD s/p multiple PCI as above: -Currently without chest pain. Patient reports symptoms are not  similar to his prior MI, but "something is not right" -He prefers ischemic evaluation with cardiac catheterization over nuclear stress test as he has previously had false negative stress testing. Will discuss with MD prior to scheduling cardiac catheterization  -No indication for heparin gtt at this time given negative troponin  -Continue aspirin and Plavix with close monitoring of his PLT count (no prior PLT to compare too) -Continue Lopressor 50 mg bid, ramipril 5 mg daily, simvastatin 40 mg qhs -Recent echo 12/2014 as above  2. Thrombocytopenia: -Uncertain chronicity  -Perhaps 2/2 DAPT -Reports 15 pound unintentional weight loss over 2 month period, remaining blood lines stable -Per primary team/PCP  3. DM2: -Uncontrolled -Per IM  4. HTN: -Improved -Meds as above  5. HLD: -Simvastatin   Signed, Christell Faith, PA-C Pager: [phone] 01/20/2015, 11:15 AM

## 2015-01-21 NOTE — Progress Notes (Signed)
Patient given discharge teaching and paperwork regarding medications, diet, follow-up appointments and activity. Patient understanding verbalized. No complaints at this time. IV and telemetry discontinued prior to leaving. Skin assessment as previously charted and vitals are stable; on room air. Patient being discharged to home. Family present during discharge teaching. No further needs by Care Management. No new prescriptions. Vascular site instructions given - understanding verbalized.

## 2015-01-21 NOTE — Discharge Instructions (Signed)
-   PLEASE HOLD METFORMIN FOR 2 DAYS BECAUSE OF THE CARDIAC CATH.

## 2015-01-21 NOTE — Progress Notes (Addendum)
Simvastatin 40 mg po daily has been substituted per [facility] protocol to atorvastatin 20 mg po daily due to drug-drug interaction with amlodipine. Please call pharmacy if you have any questions about this substitution.  [facility] Schneiders called to say pt had reaction to Lipitor (enzyme elevation) and day shift MD okayed staying on simvastatin. Changed order back to simvastatin 40 mg po daily.   Jamarion [place] A. [place], [place].D., BCPS Clinical Pharmacist 01/21/15 0045

## 2015-01-21 NOTE — Discharge Summary (Signed)
[place] [facility] [place] NAME: Juan [name]    MR#:  [id number]  DATE OF BIRTH:  September 10, 1955  DATE OF ADMISSION:  01/19/2015 ADMITTING PHYSICIAN: Lytle Butte, MD  DATE OF DISCHARGE: 01/21/15  PRIMARY CARE PHYSICIAN: No primary care provider on file.    ADMISSION DIAGNOSIS:  Chest pain, unspecified chest pain type [R07.9]  DISCHARGE DIAGNOSIS:  Principal Problem:   Chest pain, central Active Problems:   Unstable angina ([place])   SECONDARY DIAGNOSIS:   Past Medical History  Diagnosis Date  . Essential hypertension   . Hyperlipidemia   . Coronary artery disease     a. Dating back to 89 w/ h/o MI.  8 stents;  b. 10/2003 Cath AL: LM 20d, RI 40p, LAD 30p, 8m, patent stent, D1 50p, LCX 30p, patent mid stent, 90d/diffuse, RCA dominant, 30p, patent stent, 64m/d, EF 44%-->Med Rx.  . Type II diabetes mellitus ([place])   . Mild sleep apnea   . Hypothyroidism     HOSPITAL COURSE:   59 year old male with past medical history significant for CAD status post multiple PCI's, hypertension, hyperlipidemia, diabetes mellitus and hypothyroidism presents to the hospital secondary to worsening chest pain.  #1 unstable angina-prior cardiac history with multiple PCIs. -Cardiac consult is appreciated. cardiac catheterization today with severe three vessel disease - advised to hold plavix and he is set up to see cardiothoracic surgeon in 5 days for arranging for CABG. -Continue aspirin, metoprolol, ACE inhibitor and statin -Recent echocardiogram from 2 weeks ago showing normal left ventricle with EF of 55%, no wall motion abnormalities noted. Mild diastolic dysfunction is noted. - minimal radial artery hematoma after the cath, improving  #2 CAD status post stents-management as mentioned above.  #3 hypertension-on Norvasc, metoprolol and ramipril.  #4 diabetes mellitus with elevated blood sugars- with hba1c 9.7 - Takes metformin, amaryl at  home- discussed with patient about his diabetes not being under control- he says he is in the process of changing PCPs and would like them to adjust his meds Hold metformin for 2 days because of the cardiac cath  #5 hypothyroidism-continue Synthroid   Possible discharge today   DISCHARGE CONDITIONS:   Guarded  CONSULTS OBTAINED:  Treatment Team:  Lytle Butte, MD Wellington Hampshire, MD Minna Merritts, MD Wilhelmina Mcardle, MD  DRUG ALLERGIES:  No Known Allergies  DISCHARGE MEDICATIONS:   Current Discharge Medication List    CONTINUE these medications which have NOT CHANGED   Details  amLODipine (NORVASC) 5 MG tablet TAKE 1 BY MOUTH DAILY Qty: 90 tablet, Refills: 1    aspirin 81 MG tablet Take 81 mg by mouth daily.    folic acid (FOLVITE) A999333 MCG tablet Take 400 mcg by mouth daily.    glimepiride (AMARYL) 4 MG tablet TAKE 2 BY MOUTH DAILY BEFOR E BREAKFAST Qty: 180 tablet, Refills: 0    levothyroxine (SYNTHROID) 100 MCG tablet Take 1 tablet (100 mcg total) by mouth daily before breakfast. Qty: 90 tablet, Refills: 0    metFORMIN (GLUCOPHAGE-XR) 500 MG 24 hr tablet TAKE 2 BY MOUTH TWICE DAILY Qty: 360 tablet, Refills: 1    metoprolol tartrate (LOPRESSOR) 50 MG tablet Take 1 tablet (50 mg total) by mouth 2 (two) times daily. Qty: 180 tablet, Refills: 3    Multiple Vitamin (MULTIVITAMIN) tablet Take 1 tablet by mouth 2 (two) times daily.     nitroGLYCERIN (NITROSTAT) 0.4 MG SL tablet Place 1 tablet (0.4 mg total)  under the tongue every 5 (five) minutes as needed for chest pain. Qty: 10 tablet, Refills: 3    Omega-3 Fatty Acids (FISH OIL PO) Take 1,800 mg by mouth daily.     ramipril (ALTACE) 5 MG capsule TAKE 1 BY MOUTH DAILY Qty: 90 capsule, Refills: 3    simvastatin (ZOCOR) 40 MG tablet TAKE 1 BY MOUTH DAILY Qty: 90 tablet, Refills: 0      STOP taking these medications     clopidogrel (PLAVIX) 75 MG tablet          DISCHARGE INSTRUCTIONS:   1.  Follow up with Cardiothoracic surgeon in 5 days 2. PCP f/u in 2 weeks  If you experience worsening of your admission symptoms, develop shortness of breath, life threatening emergency, suicidal or homicidal thoughts you must seek medical attention immediately by calling 911 or calling your MD immediately  if symptoms less severe.  You Must read complete instructions/literature along with all the possible adverse reactions/side effects for all the Medicines you take and that have been prescribed to you. Take any new Medicines after you have completely understood and accept all the possible adverse reactions/side effects.   Please note  You were cared for by a hospitalist during your hospital stay. If you have any questions about your discharge medications or the care you received while you were in the hospital after you are discharged, you can call the unit and asked to speak with the hospitalist on call if the hospitalist that took care of you is not available. Once you are discharged, your primary care physician will handle any further medical issues. Please note that NO REFILLS for any discharge medications will be authorized once you are discharged, as it is imperative that you return to your primary care physician (or establish a relationship with a primary care physician if you do not have one) for your aftercare needs so that they can reassess your need for medications and monitor your lab values.    Today   CHIEF COMPLAINT:   Chief Complaint  Patient presents with  . Chest Pain     VITAL SIGNS:  Blood pressure 129/69, pulse 95, temperature 98.2 F (36.8 C), temperature source Oral, resp. rate 16, height 5\' 8"  (1.727 m), weight 90.493 kg (199 lb 8 oz), SpO2 97 %.  I/O:   Intake/Output Summary (Last 24 hours) at 01/21/15 1549 Last data filed at 01/21/15 0524  Gross per 24 hour  Intake    240 ml  Output    700 ml  Net   -460 ml    PHYSICAL EXAMINATION:   Physical  Exam  GENERAL:  59 y.o.-year-old patient lying in the bed with no acute distress.  EYES: Pupils equal, round, reactive to light and accommodation. No scleral icterus. Extraocular muscles intact.  HEENT: Head atraumatic, normocephalic. Oropharynx and nasopharynx clear.  NECK:  Supple, no jugular venous distention. No thyroid enlargement, no tenderness.  LUNGS: Normal breath sounds bilaterally, no wheezing, rales,rhonchi or crepitation. No use of accessory muscles of respiration.  CARDIOVASCULAR: S1, S2 normal. No murmurs, rubs, or gallops.  ABDOMEN: Soft, non-tender, non-distended. Bowel sounds present. No organomegaly or mass.  EXTREMITIES: No pedal edema, cyanosis, or clubbing. Right radial artery dressing in place with minimal bruising around the dressing, no swelling. NEUROLOGIC: Cranial nerves II through XII are intact. Muscle strength 5/5 in all extremities. Sensation intact. Gait not checked.  PSYCHIATRIC: The patient is alert and oriented x 3.  SKIN: No obvious rash, lesion, or  ulcer.   DATA REVIEW:   CBC  Recent Labs Lab 01/21/15 0403  WBC 4.4  HGB 15.9  HCT 46.4  PLT 116*    Chemistries   Recent Labs Lab 01/21/15 0403  NA 135  K 4.2  CL 98*  CO2 26  GLUCOSE 320*  BUN 17  CREATININE 0.71  CALCIUM 9.1    Cardiac Enzymes  Recent Labs Lab 01/20/15 1301  TROPONINI 0.03    Microbiology Results  No results found for this or any previous visit.  RADIOLOGY:  Dg Chest 1 View  01/19/2015  CLINICAL DATA:  Left-sided chest pain tonight. EXAM: CHEST 1 VIEW COMPARISON:  None. FINDINGS: The cardiac silhouette, mediastinal and hilar contours are normal. The lungs are clear. No pleural effusion. The bony thorax is intact. IMPRESSION: No acute cardiopulmonary findings. Electronically Signed   By: Marijo Sanes M.D.   On: 01/19/2015 21:46    EKG:   Orders placed or performed during the hospital encounter of 01/19/15  . EKG 12-Lead  . EKG 12-Lead  . EKG 12-Lead  .  EKG 12-Lead      Management plans discussed with the patient, family and they are in agreement.  CODE STATUS:     Code Status Orders        Start     Ordered   01/21/15 1349  Full code   Continuous     01/21/15 1348      TOTAL TIME TAKING CARE OF THIS PATIENT: 37 minutes.    Gladstone Lighter M.D on 01/21/2015 at 3:49 PM  Between 7am to 6pm - Pager - [phone]  After 6pm go to [email or web address] - password [email or web address] [facility]  [place] [facility]  Office  [phone]  CC: Primary care physician; No primary care provider on file.

## 2015-01-21 NOTE — Progress Notes (Signed)
Initial Nutrition Assessment   INTERVENTION:   Meals and Snacks: Cater to patient preferences Medical Food Supplement Therapy: if po intake inadequate on follow-up, recommend addition of nutritional supplement  NUTRITION DIAGNOSIS:   No nutrition diagnosis at this time  GOAL:   Patient will meet greater than or equal to 90% of their needs  MONITOR:    (Energy Intake, Anthropometrics, Electrolyte/Renal Profile, Glucose Profile)  REASON FOR ASSESSMENT:   Malnutrition Screening Tool    ASSESSMENT:    Pt admitted with chest pain, down for cardiac cath on visit today  Past Medical History  Diagnosis Date  . Essential hypertension   . Hyperlipidemia   . Coronary artery disease     a. Dating back to 6 w/ h/o MI.  8 stents;  b. 10/2003 Cath AL: LM 20d, RI 40p, LAD 30p, 53m, patent stent, D1 50p, LCX 30p, patent mid stent, 90d/diffuse, RCA dominant, 30p, patent stent, 58m/d, EF 44%-->Med Rx.  . Type II diabetes mellitus ([place])   . Mild sleep apnea   . Hypothyroidism     Diet Order:  Diet Carb Modified Fluid consistency:: Thin; Room service appropriate?: Yes   Energy Intake: recorded po intake 87% on average  Last BM:  12/21   Protein Profile: No results for input(s): ALBUMIN in the last 168 hours. Electrolyte and Renal Profile:  Recent Labs Lab 01/19/15 2208 01/20/15 0415 01/21/15 0403  BUN 19 17 17   CREATININE 0.78 0.89 0.71  NA 132* 134* 135  K 4.3 4.3 4.2   Glucose Profile:   Recent Labs  01/20/15 2117 01/21/15 0808 01/21/15 1403  GLUCAP 354* 278* 306*   Meds: ss novolog, lantus, MVI  Height:   Ht Readings from Last 1 Encounters:  01/19/15 5\' 8"  (1.727 m)    Weight:   Wt Readings from Last 1 Encounters:  01/21/15 199 lb 8 oz (90.493 kg)    BMI:  Body mass index is 30.34 kg/(m^2).  LOW Care Level  Kerman Passey MS, [place], LDN [phone] Pager  [phone] Weekend/On-Call Pager

## 2015-01-26 ENCOUNTER — Institutional Professional Consult (permissible substitution) (INDEPENDENT_AMBULATORY_CARE_PROVIDER_SITE_OTHER): Payer: BLUE CROSS/BLUE SHIELD | Admitting: Cardiothoracic Surgery

## 2015-01-26 ENCOUNTER — Other Ambulatory Visit: Payer: Self-pay | Admitting: *Deleted

## 2015-01-26 ENCOUNTER — Encounter: Payer: Self-pay | Admitting: Cardiothoracic Surgery

## 2015-01-26 VITALS — BP 135/9 | HR 83 | Resp 16 | Ht 68.0 in | Wt 198.0 lb

## 2015-01-26 DIAGNOSIS — I2510 Atherosclerotic heart disease of native coronary artery without angina pectoris: Secondary | ICD-10-CM

## 2015-01-26 DIAGNOSIS — I25119 Atherosclerotic heart disease of native coronary artery with unspecified angina pectoris: Secondary | ICD-10-CM

## 2015-01-26 NOTE — Progress Notes (Signed)
PCP is No primary care provider on file. Referring Provider is Wellington Hampshire, MD  Chief Complaint  Patient presents with  . Coronary Artery Disease    eval for CABG...cathed 01/21/15...ECHO 01/08/15   severe three-vessel coronary disease  Unstable angina pectoris  Recurrent disease and previously placed stents  HPI:  patient examined, coronary angiograms and 2-D echocardiogram personally and independently reviewed and counseled with patient   59 year old Caucasian male diabetic nonsmoker with strong family history for CAD presents after recent cardiac catheterization demonstrates severe three-vessel coronary disease and in-stent stenosis. The patient has had coronary stents placed since 1994 in [place]. He has stents in the right coronary, the cervix marginal, and the LAD. He has been on Plavix chronically. His diabetic control is with oral meds and A1c  >8   LV function appears to be fairly well preserved and there is no significant valvular disease.  He has very tight 90% stenosis in the right coronary, circumflex, and 80% to 90% in the proximal LAD. He is having chest pain with minimal activity. He was admitted through the emergency department at [facility] last week for unstable angina and ruled out for MI. Cardiac catheterization was performed. His beta  Blocker dose was increased into his discharge to home to be evaluated for CABG. He denies any symptoms of CHF.   The patient denies any and/or problems of his diabetes - no significant renal problems, no significant vision problems, no significant neuropathy of his lower extremities. No history DVT or venous insufficiency of his legs. He is had 1 dose of Plavix in the last 8 or 9 days.   Cardiac catheterization was performed via the right radial artery without complication. The patient is right-hand dominant.  Past Medical History  Diagnosis Date  . Essential hypertension   . Hyperlipidemia   . Coronary artery disease      a. Dating back to 55 w/ h/o MI.  8 stents;  b. 10/2003 Cath AL: LM 20d, RI 40p, LAD 30p, 60m, patent stent, D1 50p, LCX 30p, patent mid stent, 90d/diffuse, RCA dominant, 30p, patent stent, 38m/d, EF 44%-->Med Rx.  . Type II diabetes mellitus ([place])   . Mild sleep apnea   . Hypothyroidism     Past Surgical History  Procedure Laterality Date  . Thyroidectomy    . Cardiac catheterization  1993    [place]; Dr.Harold; [place]; Dr. Joneen Boers; [employer]  . Cardiac catheterization  1996    ""  . Cardiac catheterization      ''''  . Cardiac catheterization      '''  . Cardiac catheterization      '''  . Cardiac catheterization  2002    '''  . Cardiac catheterization  2006    [facility]; [place]   . Cyst excision      right  . Nasal septum surgery    . Cardiac stent      x 10  . Cardiac catheterization N/A 01/21/2015    Procedure: Left Heart Cath and Coronary Angiography;  Surgeon: Wellington Hampshire, MD;  Location: [facility];  Service: Cardiovascular;  Laterality: N/A;    Family History  Problem Relation Age of Onset  . Heart attack Mother   . Heart attack Father   . Cancer Father 59    early prostate cancer  . Drug abuse Sister   . Cirrhosis Sister     Social  History Social History  Substance Use Topics  . Smoking status: Never Smoker   . Smokeless tobacco: Never Used  . Alcohol Use: 0.5 oz/week    1 Standard drinks or equivalent per week     Comment: occassional    Current Outpatient Prescriptions  Medication Sig Dispense Refill  . amLODipine (NORVASC) 5 MG tablet TAKE 1 BY MOUTH DAILY 90 tablet 1  . aspirin 81 MG tablet Take 81 mg by mouth daily.    . folic acid (FOLVITE) A999333 MCG tablet Take 400 mcg by mouth daily.    Marland Kitchen glimepiride (AMARYL) 4 MG tablet TAKE 2 BY MOUTH DAILY BEFOR E BREAKFAST 180 tablet 0  . levothyroxine (SYNTHROID) 100 MCG tablet Take 1 tablet (100 mcg total) by mouth daily  before breakfast. 90 tablet 0  . metFORMIN (GLUCOPHAGE-XR) 500 MG 24 hr tablet TAKE 2 BY MOUTH TWICE DAILY 360 tablet 1  . metoprolol tartrate (LOPRESSOR) 50 MG tablet Take 1 tablet (50 mg total) by mouth 2 (two) times daily. 180 tablet 3  . Multiple Vitamin (MULTIVITAMIN) tablet Take 1 tablet by mouth 2 (two) times daily.     . nitroGLYCERIN (NITROSTAT) 0.4 MG SL tablet Place 1 tablet (0.4 mg total) under the tongue every 5 (five) minutes as needed for chest pain. 10 tablet 3  . Omega-3 Fatty Acids (FISH OIL PO) Take 1,800 mg by mouth daily.     . ramipril (ALTACE) 5 MG capsule TAKE 1 BY MOUTH DAILY 90 capsule 3  . simvastatin (ZOCOR) 40 MG tablet TAKE 1 BY MOUTH DAILY 90 tablet 0   No current facility-administered medications for this visit.    No Known Allergies  Review of Systems         Review of Systems :  [ y ] = yes, [  ] = no        General :  Weight gain [   ]    Weight loss  [   ]  Fatigue [  ]  Fever [  ]  Chills  [  ]                                Weakness  [  ]           Cardiac :  Chest pain/ pressure [  yes ]  Resting SOB [  ] exertional SOB [  ]                        Orthopnea [  ]  Pedal edema  [  ]  Palpitations [  ] Syncope/presyncope [ ]                         Paroxysmal nocturnal dyspnea [  ]        Pulmonary : cough [  ]  wheezing [  ]  Hemoptysis [  ] Sputum [  ] Snoring [  ]                              Pneumothorax [  ]  Sleep apnea [  ]       GI : Vomiting [  ]  Dysphagia [  ]  Melena  [  ]  Abdominal pain [  ] BRBPR [  ]  Heart burn [  ]  Constipation [  ] Diarrhea  [  ] Colonoscopy [  yes-4 years ago , clean ]       GU : Hematuria [  ]  Dysuria [  ]  Nocturia [  ] UTI's [  ]       Vascular : Claudication [  ]  Rest pain [  ]  DVT [  ] Vein stripping [  ] leg ulcers [  ]                          TIA [  ] Stroke [  ]  Varicose veins [  ]       NEURO :  Headaches  [  ] Seizures [  ] Vision changes [  ] Paresthesias [  ]       Musculoskeletal :   Arthritis [  ] Gout  [  ]  Back pain [  ]  Joint pain [  ]       Skin :  Rash [  ]  Melanoma [  ]        Heme : Bleeding problems [  ]Clotting Disorders [  ] Anemia [  ]Blood Transfusion [ ]        Endocrine : Diabetes [  yes ] Thyroid Disorder  [  yes-previous thyroid surgery for benign disease ]       Psych : Depression [  ]  Anxiety [  ]  Psych hospitalizations [  ]                                               BP 135/9 mmHg  Pulse 83  Resp 16  Ht 5\' 8"  (1.727 m)  Wt 198 lb (89.812 kg)  BMI 30.11 kg/m2  SpO2 96% Physical Exam       Physical Exam  General:  Middle-aged Caucasian male accompanied by   The wife and daughter no acute distress HEENT: Normocephalic pupils equal , dentition adequate Neck: Supple without JVD, adenopathy, or bruit Chest: Clear to auscultation, symmetrical breath sounds, no rhonchi, no tenderness             or deformity Cardiovascular: Regular rate and rhythm, no murmur, no gallop, peripheral pulses            trace  palpable in lower  extremities Abdomen:  Soft, nontender, no palpable mass or organomegaly Extremities: Warm, well-perfused, no clubbing cyanosis edema or tenderness,              no venous stasis changes of the legs Rectal/GU: Deferred Neuro: Grossly non--focal and symmetrical throughout Skin: Clean and dry without rash or ulceration   Diagnostic Tests:  echocardiogram-normal LV function, no significant valvular disease  Coronary angiograms- severe multivessel CAD with heavily diseased previously placed stents  Impression:  patient will be scheduled for multivessel CABG with left IMA to LAD and probable saphenous vein grafts to ramus intermediate, obtuse marginal, posterior descending.  Plan: CBG scheduled for  December 30, at [facility] hospital. Procedure indications benefits alternatives and risks fully explained and discussed with patient and wife.   Len Childs, MD [facility] [phone]

## 2015-01-27 ENCOUNTER — Other Ambulatory Visit: Payer: Self-pay

## 2015-01-27 ENCOUNTER — Encounter (HOSPITAL_COMMUNITY): Payer: Self-pay

## 2015-01-27 ENCOUNTER — Encounter (HOSPITAL_COMMUNITY)
Admission: RE | Admit: 2015-01-27 | Discharge: 2015-01-27 | Disposition: A | Discharge status: Home / Self Care | Payer: BLUE CROSS/BLUE SHIELD | Source: Ambulatory Visit | Attending: Cardiothoracic Surgery | Admitting: Cardiothoracic Surgery

## 2015-01-27 ENCOUNTER — Ambulatory Visit (HOSPITAL_COMMUNITY)
Admission: RE | Admit: 2015-01-27 | Discharge: 2015-01-27 | Disposition: A | Discharge status: Home / Self Care | Payer: BLUE CROSS/BLUE SHIELD | Source: Ambulatory Visit | Attending: Cardiothoracic Surgery | Admitting: Cardiothoracic Surgery

## 2015-01-27 DIAGNOSIS — Z01810 Encounter for preprocedural cardiovascular examination: Secondary | ICD-10-CM | POA: Diagnosis not present

## 2015-01-27 DIAGNOSIS — I2510 Atherosclerotic heart disease of native coronary artery without angina pectoris: Secondary | ICD-10-CM

## 2015-01-27 DIAGNOSIS — R9431 Abnormal electrocardiogram [ECG] [EKG]: Secondary | ICD-10-CM | POA: Insufficient documentation

## 2015-01-27 DIAGNOSIS — I6523 Occlusion and stenosis of bilateral carotid arteries: Secondary | ICD-10-CM | POA: Insufficient documentation

## 2015-01-27 DIAGNOSIS — Z01818 Encounter for other preprocedural examination: Secondary | ICD-10-CM | POA: Diagnosis not present

## 2015-01-27 DIAGNOSIS — Z01812 Encounter for preprocedural laboratory examination: Secondary | ICD-10-CM | POA: Diagnosis not present

## 2015-01-27 HISTORY — DX: Cardiac murmur, unspecified: R01.1

## 2015-01-27 LAB — CBC
HCT: 45.8 % (ref 39.0–52.0)
Hemoglobin: 16.4 g/dL (ref 13.0–17.0)
MCH: 32 pg (ref 26.0–34.0)
MCHC: 35.8 g/dL (ref 30.0–36.0)
MCV: 89.3 fL (ref 78.0–100.0)
Platelets: 147 10*3/uL — ABNORMAL LOW (ref 150–400)
RBC: 5.13 MIL/uL (ref 4.22–5.81)
RDW: 12.8 % (ref 11.5–15.5)
WBC: 5.3 10*3/uL (ref 4.0–10.5)

## 2015-01-27 LAB — URINALYSIS, ROUTINE W REFLEX MICROSCOPIC
Bilirubin Urine: NEGATIVE
Glucose, UA: >1000 mg/dL — AB
Hgb urine dipstick: NEGATIVE
Ketones, ur: 15 mg/dL — AB
Leukocytes, UA: NEGATIVE
Nitrite: NEGATIVE
Protein, ur: NEGATIVE mg/dL
Specific Gravity, Urine: 1.033 — ABNORMAL HIGH (ref 1.005–1.030)
pH: 5.5 (ref 5.0–8.0)

## 2015-01-27 LAB — PROTIME-INR
INR: 1.19 (ref 0.00–1.49)
Prothrombin Time: 15.3 seconds — ABNORMAL HIGH (ref 11.6–15.2)

## 2015-01-27 LAB — PULMONARY FUNCTION TEST
DL/VA % pred: 132 %
DL/VA: 5.87 ml/min/mmHg/L
DLCO cor % pred: 135 %
DLCO cor: 38.37 ml/min/mmHg
DLCO unc % pred: 140 %
DLCO unc: 39.71 ml/min/mmHg
FEF 25-75 Post: 2.94 L/sec
FEF 25-75 Pre: 2.03 L/sec
FEF2575-%Change-Post: 44 %
FEF2575-%Pred-Post: 106 %
FEF2575-%Pred-Pre: 73 %
FEV1-%Change-Post: 16 %
FEV1-%Pred-Post: 101 %
FEV1-%Pred-Pre: 87 %
FEV1-Post: 3.34 L
FEV1-Pre: 2.87 L
FEV1FVC-%Change-Post: 5 %
FEV1FVC-%Pred-Pre: 96 %
FEV6-%Change-Post: 11 %
FEV6-%Pred-Post: 104 %
FEV6-%Pred-Pre: 93 %
FEV6-Post: 4.29 L
FEV6-Pre: 3.85 L
FEV6FVC-%Change-Post: 0 %
FEV6FVC-%Pred-Post: 104 %
FEV6FVC-%Pred-Pre: 103 %
FVC-%Change-Post: 10 %
FVC-%Pred-Post: 100 %
FVC-%Pred-Pre: 90 %
FVC-Post: 4.33 L
FVC-Pre: 3.91 L
Post FEV1/FVC ratio: 77 %
Post FEV6/FVC ratio: 99 %
Pre FEV1/FVC ratio: 73 %
Pre FEV6/FVC Ratio: 98 %
RV % pred: 73 %
RV: 1.5 L
TLC % pred: 89 %
TLC: 5.72 L

## 2015-01-27 LAB — BLOOD GAS, ARTERIAL
Acid-base deficit: 0.5 mmol/L (ref 0.0–2.0)
Bicarbonate: 23.1 mEq/L (ref 20.0–24.0)
Drawn by: 421580
FIO2: 0.21
O2 Saturation: 97.7 %
Patient temperature: 98.6
TCO2: 24.1 mmol/L (ref 0–100)
pCO2 arterial: 34.1 mmHg — ABNORMAL LOW (ref 35.0–45.0)
pH, Arterial: 7.445 (ref 7.350–7.450)
pO2, Arterial: 92.8 mmHg (ref 80.0–100.0)

## 2015-01-27 LAB — COMPREHENSIVE METABOLIC PANEL
ALT: 37 U/L (ref 17–63)
AST: 29 U/L (ref 15–41)
Albumin: 3.9 g/dL (ref 3.5–5.0)
Alkaline Phosphatase: 71 U/L (ref 38–126)
Anion gap: 12 (ref 5–15)
BUN: 13 mg/dL (ref 6–20)
CO2: 21 mmol/L — ABNORMAL LOW (ref 22–32)
Calcium: 9.2 mg/dL (ref 8.9–10.3)
Chloride: 102 mmol/L (ref 101–111)
Creatinine, Ser: 0.73 mg/dL (ref 0.61–1.24)
GFR calc Af Amer: >60 mL/min (ref >60)
GFR calc non Af Amer: >60 mL/min (ref >60)
Glucose, Bld: 307 mg/dL — ABNORMAL HIGH (ref 65–99)
Potassium: 3.7 mmol/L (ref 3.5–5.1)
Sodium: 135 mmol/L (ref 135–145)
Total Bilirubin: 0.9 mg/dL (ref 0.3–1.2)
Total Protein: 7.3 g/dL (ref 6.5–8.1)

## 2015-01-27 LAB — URINE MICROSCOPIC-ADD ON
Bacteria, UA: NONE SEEN
RBC / HPF: NONE SEEN RBC/hpf (ref 0–5)
Squamous Epithelial / LPF: NONE SEEN

## 2015-01-27 LAB — TYPE AND SCREEN
ABO/RH(D): O NEG
Antibody Screen: NEGATIVE

## 2015-01-27 LAB — GLUCOSE, CAPILLARY: Glucose-Capillary: 253 mg/dL — ABNORMAL HIGH (ref 65–99)

## 2015-01-27 LAB — APTT: aPTT: 33 seconds (ref 24–37)

## 2015-01-27 LAB — SURGICAL PCR SCREEN
MRSA, PCR: NEGATIVE
Staphylococcus aureus: NEGATIVE

## 2015-01-27 LAB — ABO/RH: ABO/RH(D): O NEG

## 2015-01-27 MED ORDER — ALBUTEROL SULFATE (2.5 MG/3ML) 0.083% IN NEBU
2.5000 mg | INHALATION_SOLUTION | Freq: Once | RESPIRATORY_TRACT | Status: AC
  Administered 2015-01-27: 2.5 mg via RESPIRATORY_TRACT

## 2015-01-27 NOTE — Pre-Procedure Instructions (Signed)
Natthew Gebre  01/27/2015      [facility] [id number], [place] - [address] [place] [address] Phone: [phone] Fax: [phone]  [facility] (MAIL ORDER) [place], [place] [id number] Phone: [phone] Fax: [phone]    Your procedure is scheduled on   01/29/2015  Report to [facility] Admitting at 5:30 A.M.  Call this number if you have problems the morning of surgery:  [phone]   Remember:  Do not eat food or drink liquids after midnight.  On Thursday  Take these medicines the morning of surgery with A SIP OF WATER : Metoprolol, Ramipril   Do not wear jewelry   Do not wear lotions, powders, or perfumes.  You may wear deodorant.    Men may shave face and neck.   Do not bring valuables to the hospital.   [facility] is not responsible for any belongings or valuables.  Contacts, dentures or bridgework may not be worn into surgery.  Leave your suitcase in the car.  After surgery it may be brought to your room.  For patients admitted to the hospital, discharge time will be determined by your treatment team.  Patients discharged the day of surgery will not be allowed to drive home.   Name and phone number of your driver:   : with family  Special instructions:  Special Instructions: [place] - Preparing for Surgery  Before surgery, you can play an important role.  Because skin is not sterile, your skin needs to be as free of germs as possible.  You can reduce the number of germs on you skin by washing with CHG (chlorahexidine gluconate) soap before surgery.  CHG is an antiseptic cleaner which kills germs and bonds with the skin to continue killing germs even after washing.  Please DO NOT use if you have an allergy to CHG or antibacterial soaps.  If your skin becomes reddened/irritated stop using the CHG and inform your nurse when you arrive  at Short Stay.  Do not shave (including legs and underarms) for at least 48 hours prior to the first CHG shower.  You may shave your face.  Please follow these instructions carefully:   1.  Shower with CHG Soap the night before surgery and the  morning of Surgery.  2.  If you choose to wash your hair, wash your hair first as usual with your  normal shampoo.  3.  After you shampoo, rinse your hair and body thoroughly to remove the  Shampoo.  4.  Use CHG as you would any other liquid soap.  You can apply chg directly to the skin and wash gently with scrungie or a clean washcloth.  5.  Apply the CHG Soap to your body ONLY FROM THE NECK DOWN.    Do not use on open wounds or open sores.  Avoid contact with your eyes, ears, mouth and genitals (private parts).  Wash genitals (private parts)   with your normal soap.  6.  Wash thoroughly, paying special attention to the area where your surgery will be performed.  7.  Thoroughly rinse your body with warm water from the neck down.  8.  DO NOT shower/wash with your normal soap after using and rinsing off   the CHG Soap.  9.  Pat yourself dry with a clean towel.            10.  Wear clean pajamas.            11.  Place clean sheets on your bed the night of your first shower and do not sleep with pets.  Day of Surgery  Do not apply any lotions/deodorants the morning of surgery.  Please wear clean clothes to the hospital/surgery center.   How to Manage Your Diabetes Before Surgery   Why is it important to control my blood sugar before and after surgery?   Improving blood sugar levels before and after surgery helps healing and can limit problems.  A way of improving blood sugar control is eating a healthy diet by:  - Eating less sugar and carbohydrates  - Increasing activity/exercise  - Talk with your doctor about reaching your blood sugar goals  High blood sugars (greater than 180 mg/dL) can raise your risk of infections and slow down your  recovery so you will need to focus on controlling your diabetes during the weeks before surgery.  Make sure that the doctor who takes care of your diabetes knows about your planned surgery including the date and location.  How do I manage my blood sugars before surgery?   Check your blood sugar at least 4 times a day, 2 days before surgery to make sure that they are not too high or low.   Check your blood sugar the morning of your surgery when you wake up and every 2               hours until you get to the Short-Stay unit.  If your blood sugar is less than 70 mg/dL, you will need to treat for low blood sugar by:  Treat a low blood sugar (less than 70 mg/dL) with 1/2 cup of clear juice (cranberry or apple), 4 glucose tablets, OR glucose gel.  Recheck blood sugar in 15 minutes after treatment (to make sure it is greater than 70 mg/dL).  If blood sugar is not greater than 70 mg/dL on re-check, call [phone] for further instructions.   Report your blood sugar to the Short-Stay nurse when you get to Short-Stay.  References:  [facility], 2007 "How to Manage your Diabetes Before and After Surgery".  What do I do about my diabetes medications?   Do not take oral diabetes medicines (pills) the morning of surgery.  Please read over the following fact sheets that you were given. Pain Booklet, Coughing and Deep Breathing, Blood Transfusion Information, Open Heart Packet, MRSA Information and Surgical Site Infection Prevention

## 2015-01-27 NOTE — Progress Notes (Signed)
VASCULAR LAB PRELIMINARY  PRELIMINARY  PRELIMINARY  PRELIMINARY  Pre-op Cardiac Surgery  Carotid Findings:  Bilateral:  1-39% ICA stenosis.  Vertebral artery flow is antegrade.     Upper Extremity Right Left  Brachial Pressures 134 Triphasic 134 Triphasic  Radial Waveforms Triphasic Triphasic  Ulnar Waveforms Triphasic Triphasic  Palmar Arch (Allen's Test) Normal Normal   Findings:  Doppler waveforms remained normal bilaterally with both radial and ulnar compressions.    Lower  Extremity Right Left  Dorsalis Pedis 162iphasic 169 Triphasic  Posterior Tibial 168 Triphasic 171 Triphasic  Ankle/Brachial Indices 1.25 1.28    Findings:  ABIs and Doppler waveforms are within normal limits bilaterally a rest.   Juan [name], RVS 01/27/2015, 2:06 PM

## 2015-01-28 ENCOUNTER — Encounter (HOSPITAL_COMMUNITY): Payer: Self-pay | Admitting: Certified Registered Nurse Anesthetist

## 2015-01-28 ENCOUNTER — Other Ambulatory Visit: Payer: Self-pay | Admitting: *Deleted

## 2015-01-28 DIAGNOSIS — I2510 Atherosclerotic heart disease of native coronary artery without angina pectoris: Secondary | ICD-10-CM

## 2015-01-28 LAB — HEMOGLOBIN A1C
Hgb A1c MFr Bld: 11 % — ABNORMAL HIGH (ref 4.8–5.6)
Mean Plasma Glucose: 269 mg/dL

## 2015-01-28 MED ORDER — CEFUROXIME SODIUM 1.5 G IJ SOLR
1.5000 g | INTRAMUSCULAR | Status: AC
  Administered 2015-01-29: .75 g via INTRAVENOUS
  Administered 2015-01-29: 1.5 g via INTRAVENOUS
  Filled 2015-01-28: qty 1.5

## 2015-01-28 MED ORDER — PLASMA-LYTE 148 IV SOLN
INTRAVENOUS | Status: AC
  Administered 2015-01-29: 500 mL
  Filled 2015-01-28: qty 2.5

## 2015-01-28 MED ORDER — DEXTROSE 5 % IV SOLN
750.0000 mg | INTRAVENOUS | Status: DC
  Filled 2015-01-28: qty 750

## 2015-01-28 MED ORDER — SODIUM CHLORIDE 0.9 % IV SOLN
INTRAVENOUS | Status: DC
  Filled 2015-01-28: qty 40

## 2015-01-28 MED ORDER — SODIUM CHLORIDE 0.9 % IV SOLN
INTRAVENOUS | Status: DC
  Filled 2015-01-28: qty 30

## 2015-01-28 MED ORDER — METOPROLOL TARTRATE 12.5 MG HALF TABLET: 12.5000 mg | ORAL_TABLET | Freq: Once | ORAL | Status: DC

## 2015-01-28 MED ORDER — SODIUM CHLORIDE 0.9 % IV SOLN
1500.0000 mg | INTRAVENOUS | Status: AC
  Administered 2015-01-29: 1500 mg via INTRAVENOUS
  Filled 2015-01-28: qty 1500

## 2015-01-28 MED ORDER — INSULIN REGULAR HUMAN 100 UNIT/ML IJ SOLN
INTRAMUSCULAR | Status: DC
  Filled 2015-01-28: qty 2.5

## 2015-01-28 MED ORDER — EPINEPHRINE HCL 1 MG/ML IJ SOLN
0.0000 ug/min | INTRAMUSCULAR | Status: DC
  Filled 2015-01-28: qty 4

## 2015-01-28 MED ORDER — NITROGLYCERIN IN D5W 200-5 MCG/ML-% IV SOLN
2.0000 ug/min | INTRAVENOUS | Status: AC
  Administered 2015-01-29: 5 ug/min via INTRAVENOUS
  Filled 2015-01-28: qty 250

## 2015-01-28 MED ORDER — MAGNESIUM SULFATE 50 % IJ SOLN
40.0000 meq | INTRAMUSCULAR | Status: DC
  Filled 2015-01-28: qty 10

## 2015-01-28 MED ORDER — POTASSIUM CHLORIDE 2 MEQ/ML IV SOLN
80.0000 meq | INTRAVENOUS | Status: DC
  Filled 2015-01-28: qty 40

## 2015-01-28 MED ORDER — CHLORHEXIDINE GLUCONATE 0.12 % MT SOLN
15.0000 mL | Freq: Once | OROMUCOSAL | Status: AC
  Administered 2015-01-29: 15 mL via OROMUCOSAL
  Filled 2015-01-28: qty 15

## 2015-01-28 MED ORDER — CHLORHEXIDINE GLUCONATE 4 % EX LIQD: 30.0000 mL | CUTANEOUS | Status: DC

## 2015-01-28 MED ORDER — PHENYLEPHRINE HCL 10 MG/ML IJ SOLN
30.0000 ug/min | INTRAVENOUS | Status: DC
  Filled 2015-01-28: qty 2

## 2015-01-28 MED ORDER — DOPAMINE-DEXTROSE 3.2-5 MG/ML-% IV SOLN
0.0000 ug/kg/min | INTRAVENOUS | Status: AC
Start: 2015-01-29 — End: 2015-01-29
  Administered 2015-01-29: 3 ug/kg/min via INTRAVENOUS
  Filled 2015-01-28: qty 250

## 2015-01-28 MED ORDER — DEXMEDETOMIDINE HCL IN NACL 400 MCG/100ML IV SOLN
0.1000 ug/kg/h | INTRAVENOUS | Status: DC
Start: 2015-01-29 — End: 2015-01-29
  Filled 2015-01-28: qty 100

## 2015-01-28 NOTE — Progress Notes (Signed)
Anesthesia Chart Review: Patient is a 59 year old male scheduled for CABG, left radial artery harvest on 01/29/15 by Dr. Prescott Gum.  History includes nonsmoker, hyperlipidemia, hypertension, CAD/MI '94 s/p multiple stents, murmur, diabetes mellitus type 2, mild sleep apnea, thyroidectomy with secondary hypothyroidism, chronic kidney disease, asthma, nasal septum surgery. BMI 30. PCP is listed as Dr. Alyson Ingles. Cardiologist is Dr. Rockey Situ.  Meds include amlodipine, aspirin, folic acid, Amaryl, Synthroid, metformin, Lopressor, nitroglycerin, fish oil, Altace, Zocor.  01/27/15 EKG: NSR, non-specific ST/T wave abnormality, prolonged QT.  01/21/15 Cardiac cath: 1. Severe diffuse calcified three-vessel coronary artery disease with previous multivessel stenting. 2. Normal LV systolic function and mildly elevated left ventricular end-diastolic pressure.  01/08/15 Echo: Study Conclusions - Left ventricle: The cavity size was normal. Systolic function was normal. The estimated ejection fraction was in the range of 50% to 55%. Wall motion was normal; there were no regional wall motion abnormalities. Doppler parameters are consistent with abnormal left ventricular relaxation (grade 1 diastolic dysfunction). - Aortic valve: Sclerosis without stenosis. - Left atrium: The atrium was mildly dilated.  01/07/15 Carotid U/S (PRELIM): Bilateral: 1-39% ICA stenosis. Vertebral artery flow is antegrade.  01/27/15 PFTs: FVC 3.91 (90%), FEV1 2.87 (87%), DLCOunc 39.71 (140%).  01/27/15 CXR: IMPRESSION: Small nodular opacity projecting over the lower left lateral hemithorax potentially representing nipple shadow. Recommend repeat evaluation with nipple markers to see if this corresponds with nipple shadow. If this does not correspond, further evaluation with nonacute chest CT would be recommended. No acute cardiopulmonary process.  Preoperative labs noted. A1C 11.0. TCTS RN Thurmond Butts notified and will ensure  surgeon has reviewed PAT results. Notes indicate that patient has been having unstable angina, so may not be able to delay surgery for better DM control. Will defer to surgeon. Plan fasting CBG on arrival and management with insulin as indicated.  George Hugh [facility] Short Stay Center/Anesthesiology Phone [phone] 01/28/2015 9:44 AM

## 2015-01-29 ENCOUNTER — Inpatient Hospital Stay (HOSPITAL_COMMUNITY): Payer: BLUE CROSS/BLUE SHIELD | Admitting: Anesthesiology

## 2015-01-29 ENCOUNTER — Inpatient Hospital Stay (HOSPITAL_COMMUNITY)
Admission: AD | Admit: 2015-01-29 | Discharge: 2015-02-03 | DRG: 236 | Disposition: A | Discharge status: Home / Self Care | Payer: BLUE CROSS/BLUE SHIELD | Source: Ambulatory Visit | Attending: Cardiothoracic Surgery | Admitting: Cardiothoracic Surgery

## 2015-01-29 ENCOUNTER — Inpatient Hospital Stay (HOSPITAL_COMMUNITY): Payer: BLUE CROSS/BLUE SHIELD

## 2015-01-29 ENCOUNTER — Encounter (HOSPITAL_COMMUNITY): Payer: Self-pay | Admitting: General Practice

## 2015-01-29 ENCOUNTER — Encounter (HOSPITAL_COMMUNITY)
Admission: AD | Disposition: A | Discharge status: Home / Self Care | Payer: BLUE CROSS/BLUE SHIELD | Source: Ambulatory Visit | Attending: Cardiothoracic Surgery

## 2015-01-29 ENCOUNTER — Inpatient Hospital Stay (HOSPITAL_COMMUNITY): Payer: BLUE CROSS/BLUE SHIELD | Admitting: Vascular Surgery

## 2015-01-29 DIAGNOSIS — I10 Essential (primary) hypertension: Secondary | ICD-10-CM | POA: Diagnosis present

## 2015-01-29 DIAGNOSIS — Y848 Other medical procedures as the cause of abnormal reaction of the patient, or of later complication, without mention of misadventure at the time of the procedure: Secondary | ICD-10-CM | POA: Diagnosis present

## 2015-01-29 DIAGNOSIS — G4733 Obstructive sleep apnea (adult) (pediatric): Secondary | ICD-10-CM | POA: Diagnosis present

## 2015-01-29 DIAGNOSIS — Z8249 Family history of ischemic heart disease and other diseases of the circulatory system: Secondary | ICD-10-CM

## 2015-01-29 DIAGNOSIS — E039 Hypothyroidism, unspecified: Secondary | ICD-10-CM | POA: Diagnosis present

## 2015-01-29 DIAGNOSIS — I471 Supraventricular tachycardia: Secondary | ICD-10-CM | POA: Diagnosis not present

## 2015-01-29 DIAGNOSIS — E8770 Fluid overload, unspecified: Secondary | ICD-10-CM | POA: Diagnosis not present

## 2015-01-29 DIAGNOSIS — Z9114 Patient's other noncompliance with medication regimen: Secondary | ICD-10-CM

## 2015-01-29 DIAGNOSIS — D62 Acute posthemorrhagic anemia: Secondary | ICD-10-CM | POA: Diagnosis not present

## 2015-01-29 DIAGNOSIS — E1165 Type 2 diabetes mellitus with hyperglycemia: Secondary | ICD-10-CM | POA: Diagnosis present

## 2015-01-29 DIAGNOSIS — I25110 Atherosclerotic heart disease of native coronary artery with unstable angina pectoris: Secondary | ICD-10-CM | POA: Diagnosis present

## 2015-01-29 DIAGNOSIS — I252 Old myocardial infarction: Secondary | ICD-10-CM | POA: Diagnosis not present

## 2015-01-29 DIAGNOSIS — Z951 Presence of aortocoronary bypass graft: Secondary | ICD-10-CM

## 2015-01-29 DIAGNOSIS — T82855A Stenosis of coronary artery stent, initial encounter: Secondary | ICD-10-CM | POA: Diagnosis present

## 2015-01-29 DIAGNOSIS — D696 Thrombocytopenia, unspecified: Secondary | ICD-10-CM | POA: Diagnosis present

## 2015-01-29 DIAGNOSIS — I2510 Atherosclerotic heart disease of native coronary artery without angina pectoris: Secondary | ICD-10-CM

## 2015-01-29 DIAGNOSIS — E785 Hyperlipidemia, unspecified: Secondary | ICD-10-CM | POA: Diagnosis present

## 2015-01-29 DIAGNOSIS — R0602 Shortness of breath: Secondary | ICD-10-CM

## 2015-01-29 DIAGNOSIS — J939 Pneumothorax, unspecified: Secondary | ICD-10-CM

## 2015-01-29 HISTORY — PX: CORONARY ARTERY BYPASS GRAFT: SHX141

## 2015-01-29 HISTORY — PX: TEE WITHOUT CARDIOVERSION: SHX5443

## 2015-01-29 LAB — POCT I-STAT 3, ART BLOOD GAS (G3+)
Acid-Base Excess: 2 mmol/L (ref 0.0–2.0)
Acid-base deficit: 2 mmol/L (ref 0.0–2.0)
Bicarbonate: 25.6 mEq/L — ABNORMAL HIGH (ref 20.0–24.0)
Bicarbonate: 25.3 mEq/L — ABNORMAL HIGH (ref 20.0–24.0)
Bicarbonate: 24.2 mEq/L — ABNORMAL HIGH (ref 20.0–24.0)
Bicarbonate: 25.1 mEq/L — ABNORMAL HIGH (ref 20.0–24.0)
Bicarbonate: 23.5 mEq/L (ref 20.0–24.0)
O2 Saturation: 100 %
O2 Saturation: 100 %
O2 Saturation: 99 %
O2 Saturation: 99 %
O2 Saturation: 98 %
Patient temperature: 35.8
Patient temperature: 36.4
Patient temperature: 36.5
TCO2: 27 mmol/L (ref 0–100)
TCO2: 26 mmol/L (ref 0–100)
TCO2: 25 mmol/L (ref 0–100)
TCO2: 26 mmol/L (ref 0–100)
TCO2: 25 mmol/L (ref 0–100)
pCO2 arterial: 47.2 mmHg — ABNORMAL HIGH (ref 35.0–45.0)
pCO2 arterial: 35.4 mmHg (ref 35.0–45.0)
pCO2 arterial: 35.9 mmHg (ref 35.0–45.0)
pCO2 arterial: 40.5 mmHg (ref 35.0–45.0)
pCO2 arterial: 42 mmHg (ref 35.0–45.0)
pH, Arterial: 7.342 — ABNORMAL LOW (ref 7.350–7.450)
pH, Arterial: 7.462 — ABNORMAL HIGH (ref 7.350–7.450)
pH, Arterial: 7.431 (ref 7.350–7.450)
pH, Arterial: 7.397 (ref 7.350–7.450)
pH, Arterial: 7.355 (ref 7.350–7.450)
pO2, Arterial: 297 mmHg — ABNORMAL HIGH (ref 80.0–100.0)
pO2, Arterial: 415 mmHg — ABNORMAL HIGH (ref 80.0–100.0)
pO2, Arterial: 127 mmHg — ABNORMAL HIGH (ref 80.0–100.0)
pO2, Arterial: 125 mmHg — ABNORMAL HIGH (ref 80.0–100.0)
pO2, Arterial: 102 mmHg — ABNORMAL HIGH (ref 80.0–100.0)

## 2015-01-29 LAB — POCT I-STAT, CHEM 8
BUN: 22 mg/dL — ABNORMAL HIGH (ref 6–20)
BUN: 21 mg/dL — ABNORMAL HIGH (ref 6–20)
BUN: 19 mg/dL (ref 6–20)
BUN: 22 mg/dL — ABNORMAL HIGH (ref 6–20)
BUN: 20 mg/dL (ref 6–20)
BUN: 18 mg/dL (ref 6–20)
BUN: 16 mg/dL (ref 6–20)
BUN: 14 mg/dL (ref 6–20)
Calcium, Ion: 1.18 mmol/L (ref 1.12–1.23)
Calcium, Ion: 1.22 mmol/L (ref 1.12–1.23)
Calcium, Ion: 1.02 mmol/L — ABNORMAL LOW (ref 1.12–1.23)
Calcium, Ion: 1.22 mmol/L (ref 1.12–1.23)
Calcium, Ion: 1.08 mmol/L — ABNORMAL LOW (ref 1.12–1.23)
Calcium, Ion: 1.07 mmol/L — ABNORMAL LOW (ref 1.12–1.23)
Calcium, Ion: 1.02 mmol/L — ABNORMAL LOW (ref 1.12–1.23)
Calcium, Ion: 1.16 mmol/L (ref 1.12–1.23)
Chloride: 98 mmol/L — ABNORMAL LOW (ref 101–111)
Chloride: 100 mmol/L — ABNORMAL LOW (ref 101–111)
Chloride: 100 mmol/L — ABNORMAL LOW (ref 101–111)
Chloride: 94 mmol/L — ABNORMAL LOW (ref 101–111)
Chloride: 98 mmol/L — ABNORMAL LOW (ref 101–111)
Chloride: 100 mmol/L — ABNORMAL LOW (ref 101–111)
Chloride: 99 mmol/L — ABNORMAL LOW (ref 101–111)
Chloride: 105 mmol/L (ref 101–111)
Creatinine, Ser: 0.4 mg/dL — ABNORMAL LOW (ref 0.61–1.24)
Creatinine, Ser: 0.4 mg/dL — ABNORMAL LOW (ref 0.61–1.24)
Creatinine, Ser: 0.5 mg/dL — ABNORMAL LOW (ref 0.61–1.24)
Creatinine, Ser: 0.5 mg/dL — ABNORMAL LOW (ref 0.61–1.24)
Creatinine, Ser: 0.5 mg/dL — ABNORMAL LOW (ref 0.61–1.24)
Creatinine, Ser: 0.5 mg/dL — ABNORMAL LOW (ref 0.61–1.24)
Creatinine, Ser: 0.3 mg/dL — ABNORMAL LOW (ref 0.61–1.24)
Creatinine, Ser: 0.6 mg/dL — ABNORMAL LOW (ref 0.61–1.24)
Glucose, Bld: 326 mg/dL — ABNORMAL HIGH (ref 65–99)
Glucose, Bld: 310 mg/dL — ABNORMAL HIGH (ref 65–99)
Glucose, Bld: 240 mg/dL — ABNORMAL HIGH (ref 65–99)
Glucose, Bld: 282 mg/dL — ABNORMAL HIGH (ref 65–99)
Glucose, Bld: 250 mg/dL — ABNORMAL HIGH (ref 65–99)
Glucose, Bld: 248 mg/dL — ABNORMAL HIGH (ref 65–99)
Glucose, Bld: 249 mg/dL — ABNORMAL HIGH (ref 65–99)
Glucose, Bld: 115 mg/dL — ABNORMAL HIGH (ref 65–99)
HCT: 45 % (ref 39.0–52.0)
HCT: 43 % (ref 39.0–52.0)
HCT: 32 % — ABNORMAL LOW (ref 39.0–52.0)
HCT: 42 % (ref 39.0–52.0)
HCT: 37 % — ABNORMAL LOW (ref 39.0–52.0)
HCT: 36 % — ABNORMAL LOW (ref 39.0–52.0)
HCT: 31 % — ABNORMAL LOW (ref 39.0–52.0)
HCT: 35 % — ABNORMAL LOW (ref 39.0–52.0)
Hemoglobin: 15.3 g/dL (ref 13.0–17.0)
Hemoglobin: 14.6 g/dL (ref 13.0–17.0)
Hemoglobin: 10.9 g/dL — ABNORMAL LOW (ref 13.0–17.0)
Hemoglobin: 14.3 g/dL (ref 13.0–17.0)
Hemoglobin: 12.6 g/dL — ABNORMAL LOW (ref 13.0–17.0)
Hemoglobin: 12.2 g/dL — ABNORMAL LOW (ref 13.0–17.0)
Hemoglobin: 10.5 g/dL — ABNORMAL LOW (ref 13.0–17.0)
Hemoglobin: 11.9 g/dL — ABNORMAL LOW (ref 13.0–17.0)
Potassium: 4.2 mmol/L (ref 3.5–5.1)
Potassium: 4 mmol/L (ref 3.5–5.1)
Potassium: 3.4 mmol/L — ABNORMAL LOW (ref 3.5–5.1)
Potassium: 3.9 mmol/L (ref 3.5–5.1)
Potassium: 3.7 mmol/L (ref 3.5–5.1)
Potassium: 3.7 mmol/L (ref 3.5–5.1)
Potassium: 3.5 mmol/L (ref 3.5–5.1)
Potassium: 4.1 mmol/L (ref 3.5–5.1)
Sodium: 136 mmol/L (ref 135–145)
Sodium: 136 mmol/L (ref 135–145)
Sodium: 135 mmol/L (ref 135–145)
Sodium: 138 mmol/L (ref 135–145)
Sodium: 136 mmol/L (ref 135–145)
Sodium: 136 mmol/L (ref 135–145)
Sodium: 135 mmol/L (ref 135–145)
Sodium: 140 mmol/L (ref 135–145)
TCO2: 29 mmol/L (ref 0–100)
TCO2: 29 mmol/L (ref 0–100)
TCO2: 25 mmol/L (ref 0–100)
TCO2: 29 mmol/L (ref 0–100)
TCO2: 27 mmol/L (ref 0–100)
TCO2: 26 mmol/L (ref 0–100)
TCO2: 28 mmol/L (ref 0–100)
TCO2: 24 mmol/L (ref 0–100)

## 2015-01-29 LAB — CBC
HCT: 35.9 % — ABNORMAL LOW (ref 39.0–52.0)
HEMATOCRIT: 33.5 % — AB (ref 39.0–52.0)
HEMOGLOBIN: 12 g/dL — AB (ref 13.0–17.0)
Hemoglobin: 12.6 g/dL — ABNORMAL LOW (ref 13.0–17.0)
MCH: 32.2 pg (ref 26.0–34.0)
MCH: 31.4 pg (ref 26.0–34.0)
MCHC: 35.8 g/dL (ref 30.0–36.0)
MCHC: 35.1 g/dL (ref 30.0–36.0)
MCV: 89.8 fL (ref 78.0–100.0)
MCV: 89.5 fL (ref 78.0–100.0)
Platelets: 87 10*3/uL — ABNORMAL LOW (ref 150–400)
Platelets: 117 10*3/uL — ABNORMAL LOW (ref 150–400)
RBC: 3.73 MIL/uL — AB (ref 4.22–5.81)
RBC: 4.01 MIL/uL — ABNORMAL LOW (ref 4.22–5.81)
RDW: 13.1 % (ref 11.5–15.5)
RDW: 13.2 % (ref 11.5–15.5)
WBC: 8 10*3/uL (ref 4.0–10.5)
WBC: 11.4 10*3/uL — ABNORMAL HIGH (ref 4.0–10.5)

## 2015-01-29 LAB — GLUCOSE, CAPILLARY
Glucose-Capillary: 258 mg/dL — ABNORMAL HIGH (ref 65–99)
Glucose-Capillary: 155 mg/dL — ABNORMAL HIGH (ref 65–99)
Glucose-Capillary: 123 mg/dL — ABNORMAL HIGH (ref 65–99)
Glucose-Capillary: 101 mg/dL — ABNORMAL HIGH (ref 65–99)
Glucose-Capillary: 107 mg/dL — ABNORMAL HIGH (ref 65–99)
Glucose-Capillary: 100 mg/dL — ABNORMAL HIGH (ref 65–99)
Glucose-Capillary: 111 mg/dL — ABNORMAL HIGH (ref 65–99)

## 2015-01-29 LAB — POCT I-STAT 4, (NA,K, GLUC, HGB,HCT)
Glucose, Bld: 206 mg/dL — ABNORMAL HIGH (ref 65–99)
HCT: 35 % — ABNORMAL LOW (ref 39.0–52.0)
Hemoglobin: 11.9 g/dL — ABNORMAL LOW (ref 13.0–17.0)
Potassium: 3.6 mmol/L (ref 3.5–5.1)
Sodium: 139 mmol/L (ref 135–145)

## 2015-01-29 LAB — HEMOGLOBIN AND HEMATOCRIT, BLOOD
HCT: 34.4 % — ABNORMAL LOW (ref 39.0–52.0)
Hemoglobin: 12.4 g/dL — ABNORMAL LOW (ref 13.0–17.0)

## 2015-01-29 LAB — CREATININE, SERUM
Creatinine, Ser: 0.6 mg/dL — ABNORMAL LOW (ref 0.61–1.24)
GFR calc Af Amer: >60 mL/min (ref >60)
GFR calc non Af Amer: >60 mL/min (ref >60)

## 2015-01-29 LAB — PROTIME-INR
INR: 1.64 — ABNORMAL HIGH (ref 0.00–1.49)
Prothrombin Time: 19.4 seconds — ABNORMAL HIGH (ref 11.6–15.2)

## 2015-01-29 LAB — PLATELET COUNT: Platelets: 116 10*3/uL — ABNORMAL LOW (ref 150–400)

## 2015-01-29 LAB — APTT: aPTT: 34 seconds (ref 24–37)

## 2015-01-29 LAB — MAGNESIUM: Magnesium: 2.7 mg/dL — ABNORMAL HIGH (ref 1.7–2.4)

## 2015-01-29 SURGERY — CORONARY ARTERY BYPASS GRAFTING (CABG)
Anesthesia: General | Site: Chest

## 2015-01-29 MED ORDER — POTASSIUM CHLORIDE 10 MEQ/50ML IV SOLN
10.0000 meq | INTRAVENOUS | Status: AC
  Administered 2015-01-29 (×3): 10 meq via INTRAVENOUS

## 2015-01-29 MED ORDER — ALBUMIN HUMAN 5 % IV SOLN
INTRAVENOUS | Status: DC | PRN
  Administered 2015-01-29 (×2): via INTRAVENOUS

## 2015-01-29 MED ORDER — BISACODYL 5 MG PO TBEC
10.0000 mg | DELAYED_RELEASE_TABLET | Freq: Every day | ORAL | Status: DC
Start: 2015-01-30 — End: 2015-02-03
  Administered 2015-01-30: 10 mg via ORAL
  Filled 2015-01-29 (×5): qty 2

## 2015-01-29 MED ORDER — VANCOMYCIN HCL IN DEXTROSE 1-5 GM/200ML-% IV SOLN
1000.0000 mg | Freq: Two times a day (BID) | INTRAVENOUS | Status: AC
Start: 2015-01-29 — End: 2015-01-30
  Administered 2015-01-29 – 2015-01-30 (×3): 1000 mg via INTRAVENOUS
  Filled 2015-01-29 (×4): qty 200

## 2015-01-29 MED ORDER — MIDAZOLAM HCL 2 MG/2ML IJ SOLN
2.0000 mg | INTRAMUSCULAR | Status: DC | PRN
Start: 2015-01-29 — End: 2015-01-30

## 2015-01-29 MED ORDER — OXYCODONE HCL 5 MG PO TABS
5.0000 mg | ORAL_TABLET | ORAL | Status: DC | PRN
  Administered 2015-01-29 – 2015-01-30 (×2): 5 mg via ORAL
  Administered 2015-01-30: 10 mg via ORAL
  Administered 2015-01-30: 5 mg via ORAL
  Administered 2015-01-30 – 2015-01-31 (×2): 10 mg via ORAL
  Administered 2015-01-31: 5 mg via ORAL
  Administered 2015-01-31: 10 mg via ORAL
  Administered 2015-01-31: 5 mg via ORAL
  Filled 2015-01-29: qty 2
  Filled 2015-01-29 (×3): qty 1
  Filled 2015-01-29 (×4): qty 2

## 2015-01-29 MED ORDER — MORPHINE SULFATE (PF) 2 MG/ML IV SOLN
2.0000 mg | INTRAVENOUS | Status: DC | PRN
  Administered 2015-01-29: 4 mg via INTRAVENOUS
  Administered 2015-01-29: 2 mg via INTRAVENOUS
  Administered 2015-01-29: 4 mg via INTRAVENOUS
  Administered 2015-01-29: 2 mg via INTRAVENOUS
  Administered 2015-01-30 (×3): 4 mg via INTRAVENOUS
  Filled 2015-01-29 (×5): qty 2

## 2015-01-29 MED ORDER — ROCURONIUM BROMIDE 50 MG/5ML IV SOLN
INTRAVENOUS | Status: AC
  Filled 2015-01-29: qty 3

## 2015-01-29 MED ORDER — ASPIRIN 81 MG PO CHEW: 324.0000 mg | CHEWABLE_TABLET | Freq: Every day | ORAL | Status: DC

## 2015-01-29 MED ORDER — INSULIN REGULAR HUMAN 100 UNIT/ML IJ SOLN
INTRAMUSCULAR | Status: DC
  Administered 2015-01-29: 3.7 [IU]/h via INTRAVENOUS
  Filled 2015-01-29 (×2): qty 2.5

## 2015-01-29 MED ORDER — FENTANYL CITRATE (PF) 250 MCG/5ML IJ SOLN
INTRAMUSCULAR | Status: AC
  Filled 2015-01-29: qty 5

## 2015-01-29 MED ORDER — LEVOTHYROXINE SODIUM 100 MCG PO TABS
100.0000 ug | ORAL_TABLET | Freq: Every day | ORAL | Status: DC
  Administered 2015-01-30 – 2015-02-03 (×5): 100 ug via ORAL
  Filled 2015-01-29 (×5): qty 1

## 2015-01-29 MED ORDER — SODIUM CHLORIDE 0.9 % IJ SOLN
3.0000 mL | Freq: Two times a day (BID) | INTRAMUSCULAR | Status: DC
  Administered 2015-01-30 – 2015-02-02 (×8): 3 mL via INTRAVENOUS

## 2015-01-29 MED ORDER — MIDAZOLAM HCL 5 MG/5ML IJ SOLN
INTRAMUSCULAR | Status: DC | PRN
  Administered 2015-01-29: 2 mg via INTRAVENOUS
  Administered 2015-01-29: 3 mg via INTRAVENOUS

## 2015-01-29 MED ORDER — PROPOFOL 10 MG/ML IV BOLUS
INTRAVENOUS | Status: DC | PRN
  Administered 2015-01-29: 100 mg via INTRAVENOUS

## 2015-01-29 MED ORDER — FENTANYL CITRATE (PF) 250 MCG/5ML IJ SOLN
INTRAMUSCULAR | Status: AC
  Filled 2015-01-29: qty 20

## 2015-01-29 MED ORDER — VANCOMYCIN HCL IN DEXTROSE 1-5 GM/200ML-% IV SOLN
1000.0000 mg | Freq: Once | INTRAVENOUS | Status: DC
  Filled 2015-01-29: qty 200

## 2015-01-29 MED ORDER — PROMETHAZINE HCL 25 MG/ML IJ SOLN: 6.2500 mg | INTRAMUSCULAR | Status: DC | PRN

## 2015-01-29 MED ORDER — SODIUM CHLORIDE 0.9 % IJ SOLN: 3.0000 mL | INTRAMUSCULAR | Status: DC | PRN

## 2015-01-29 MED ORDER — SODIUM CHLORIDE 0.45 % IV SOLN
INTRAVENOUS | Status: DC | PRN
  Administered 2015-01-29: 20 mL/h via INTRAVENOUS

## 2015-01-29 MED ORDER — 0.9 % SODIUM CHLORIDE (POUR BTL) OPTIME
TOPICAL | Status: DC | PRN
  Administered 2015-01-29: 6000 mL

## 2015-01-29 MED ORDER — MORPHINE SULFATE (PF) 2 MG/ML IV SOLN
1.0000 mg | INTRAVENOUS | Status: AC | PRN
  Filled 2015-01-29 (×2): qty 1

## 2015-01-29 MED ORDER — HEMOSTATIC AGENTS (NO CHARGE) OPTIME
TOPICAL | Status: DC | PRN
  Administered 2015-01-29: 1 via TOPICAL

## 2015-01-29 MED ORDER — TRAMADOL HCL 50 MG PO TABS: 50.0000 mg | ORAL_TABLET | Freq: Four times a day (QID) | ORAL | Status: DC | PRN

## 2015-01-29 MED ORDER — OMEGA-3-ACID ETHYL ESTERS 1 G PO CAPS
1.0000 g | ORAL_CAPSULE | Freq: Every day | ORAL | Status: DC
Start: 2015-02-01 — End: 2015-01-30

## 2015-01-29 MED ORDER — DOPAMINE-DEXTROSE 3.2-5 MG/ML-% IV SOLN: 0.0000 ug/kg/min | INTRAVENOUS | Status: DC

## 2015-01-29 MED ORDER — MUPIROCIN CALCIUM 2 % EX CREA
TOPICAL_CREAM | Freq: Two times a day (BID) | CUTANEOUS | Status: DC
  Administered 2015-01-30: 1 via TOPICAL
  Administered 2015-01-31 – 2015-02-01 (×2): via TOPICAL
  Filled 2015-01-29: qty 15

## 2015-01-29 MED ORDER — PHENYLEPHRINE HCL 10 MG/ML IJ SOLN
10.0000 mg | INTRAVENOUS | Status: DC | PRN
  Administered 2015-01-29: 20 ug/min via INTRAVENOUS

## 2015-01-29 MED ORDER — LACTATED RINGERS IV SOLN
INTRAVENOUS | Status: DC
Start: 2015-01-29 — End: 2015-02-03

## 2015-01-29 MED ORDER — METOPROLOL TARTRATE 25 MG/10 ML ORAL SUSPENSION: 12.5000 mg | Freq: Two times a day (BID) | ORAL | Status: DC

## 2015-01-29 MED ORDER — CEFUROXIME SODIUM 1.5 G IJ SOLR
1.5000 g | Freq: Two times a day (BID) | INTRAMUSCULAR | Status: AC
  Administered 2015-01-29 – 2015-01-31 (×4): 1.5 g via INTRAVENOUS
  Filled 2015-01-29 (×4): qty 1.5

## 2015-01-29 MED ORDER — ACETAMINOPHEN 650 MG RE SUPP
650.0000 mg | Freq: Once | RECTAL | Status: AC
  Administered 2015-01-29: 650 mg via RECTAL

## 2015-01-29 MED ORDER — SODIUM CHLORIDE 0.9 % IJ SOLN
OROMUCOSAL | Status: DC | PRN
  Administered 2015-01-29 (×3): 4 mL via TOPICAL

## 2015-01-29 MED ORDER — ACETAMINOPHEN 160 MG/5ML PO SOLN: 1000.0000 mg | Freq: Four times a day (QID) | ORAL | Status: DC

## 2015-01-29 MED ORDER — ADULT MULTIVITAMIN W/MINERALS CH
1.0000 | ORAL_TABLET | Freq: Every day | ORAL | Status: DC
  Administered 2015-01-30 – 2015-02-03 (×5): 1 via ORAL
  Filled 2015-01-29 (×5): qty 1

## 2015-01-29 MED ORDER — ASPIRIN EC 325 MG PO TBEC
325.0000 mg | DELAYED_RELEASE_TABLET | Freq: Every day | ORAL | Status: DC
  Administered 2015-01-30 – 2015-01-31 (×2): 325 mg via ORAL
  Filled 2015-01-29 (×2): qty 1

## 2015-01-29 MED ORDER — ROCURONIUM BROMIDE 50 MG/5ML IV SOLN
INTRAVENOUS | Status: AC
  Filled 2015-01-29: qty 1

## 2015-01-29 MED ORDER — METOPROLOL TARTRATE 1 MG/ML IV SOLN
2.5000 mg | INTRAVENOUS | Status: DC | PRN
  Administered 2015-01-30: 5 mg via INTRAVENOUS
  Filled 2015-01-29: qty 5

## 2015-01-29 MED ORDER — LIDOCAINE HCL (CARDIAC) 20 MG/ML IV SOLN
INTRAVENOUS | Status: DC | PRN
  Administered 2015-01-29: 80 mg via INTRAVENOUS

## 2015-01-29 MED ORDER — BISACODYL 10 MG RE SUPP
10.0000 mg | Freq: Every day | RECTAL | Status: DC
  Filled 2015-01-29: qty 1

## 2015-01-29 MED ORDER — LACTATED RINGERS IV SOLN
INTRAVENOUS | Status: DC | PRN
  Administered 2015-01-29 (×2): via INTRAVENOUS

## 2015-01-29 MED ORDER — FOLIC ACID 1 MG PO TABS
1.0000 mg | ORAL_TABLET | Freq: Every day | ORAL | Status: DC
  Administered 2015-01-30 – 2015-02-03 (×5): 1 mg via ORAL
  Filled 2015-01-29 (×5): qty 1

## 2015-01-29 MED ORDER — CHLORHEXIDINE GLUCONATE 0.12 % MT SOLN
15.0000 mL | OROMUCOSAL | Status: AC
  Administered 2015-01-29: 15 mL via OROMUCOSAL

## 2015-01-29 MED ORDER — NITROGLYCERIN IN D5W 200-5 MCG/ML-% IV SOLN: 0.0000 ug/min | INTRAVENOUS | Status: DC

## 2015-01-29 MED ORDER — HEPARIN SODIUM (PORCINE) 1000 UNIT/ML IJ SOLN
INTRAMUSCULAR | Status: DC | PRN
  Administered 2015-01-29: 9 mL via INTRAVENOUS
  Administered 2015-01-29: 2 mL via INTRAVENOUS
  Administered 2015-01-29: 1 mL via INTRAVENOUS
  Administered 2015-01-29: 10 mL via INTRAVENOUS

## 2015-01-29 MED ORDER — SODIUM CHLORIDE 0.9 % IV SOLN
INTRAVENOUS | Status: DC
  Administered 2015-01-30: 20 mL via INTRAVENOUS

## 2015-01-29 MED ORDER — PROTAMINE SULFATE 10 MG/ML IV SOLN
INTRAVENOUS | Status: DC | PRN
  Administered 2015-01-29 (×6): 30 mg via INTRAVENOUS

## 2015-01-29 MED ORDER — MIDAZOLAM HCL 10 MG/2ML IJ SOLN
INTRAMUSCULAR | Status: AC
  Filled 2015-01-29: qty 2

## 2015-01-29 MED ORDER — SIMVASTATIN 40 MG PO TABS
40.0000 mg | ORAL_TABLET | Freq: Every day | ORAL | Status: DC
  Administered 2015-01-30 – 2015-01-31 (×2): 40 mg via ORAL
  Filled 2015-01-29 (×2): qty 1

## 2015-01-29 MED ORDER — MAGNESIUM SULFATE 4 GM/100ML IV SOLN
4.0000 g | Freq: Once | INTRAVENOUS | Status: AC
  Administered 2015-01-29: 4 g via INTRAVENOUS
  Filled 2015-01-29: qty 100

## 2015-01-29 MED ORDER — LACTATED RINGERS IV SOLN
INTRAVENOUS | Status: DC | PRN
  Administered 2015-01-29: 07:00:00 via INTRAVENOUS

## 2015-01-29 MED ORDER — ALBUMIN HUMAN 5 % IV SOLN
250.0000 mL | INTRAVENOUS | Status: AC | PRN
Start: 2015-01-29 — End: 2015-01-30
  Administered 2015-01-29 – 2015-01-30 (×4): 250 mL via INTRAVENOUS
  Filled 2015-01-29 (×2): qty 250

## 2015-01-29 MED ORDER — SODIUM CHLORIDE 0.9 % IV SOLN: 250.0000 mL | INTRAVENOUS | Status: DC

## 2015-01-29 MED ORDER — SODIUM CHLORIDE 0.9 % IV SOLN
200.0000 ug | INTRAVENOUS | Status: DC | PRN
  Administered 2015-01-29: 0.2 ug/kg/h via INTRAVENOUS

## 2015-01-29 MED ORDER — FENTANYL CITRATE (PF) 100 MCG/2ML IJ SOLN
INTRAMUSCULAR | Status: DC | PRN
  Administered 2015-01-29: 250 ug via INTRAVENOUS
  Administered 2015-01-29: 150 ug via INTRAVENOUS
  Administered 2015-01-29: 250 ug via INTRAVENOUS
  Administered 2015-01-29: 100 ug via INTRAVENOUS
  Administered 2015-01-29 (×5): 250 ug via INTRAVENOUS

## 2015-01-29 MED ORDER — PROPOFOL 10 MG/ML IV BOLUS
INTRAVENOUS | Status: AC
  Filled 2015-01-29: qty 40

## 2015-01-29 MED ORDER — PHENYLEPHRINE HCL 10 MG/ML IJ SOLN
0.0000 ug/min | INTRAVENOUS | Status: DC
  Administered 2015-01-30: 15 ug/min via INTRAVENOUS
  Filled 2015-01-29: qty 2

## 2015-01-29 MED ORDER — INSULIN REGULAR HUMAN 100 UNIT/ML IJ SOLN
250.0000 [IU] | INTRAMUSCULAR | Status: DC | PRN
  Administered 2015-01-29: 1 [IU]/h via INTRAVENOUS

## 2015-01-29 MED ORDER — SODIUM CHLORIDE 0.9 % IV SOLN: Freq: Once | INTRAVENOUS | Status: DC

## 2015-01-29 MED ORDER — FAMOTIDINE IN NACL 20-0.9 MG/50ML-% IV SOLN
20.0000 mg | Freq: Two times a day (BID) | INTRAVENOUS | Status: DC
Start: 2015-01-29 — End: 2015-01-30
  Administered 2015-01-29: 20 mg via INTRAVENOUS

## 2015-01-29 MED ORDER — INSULIN REGULAR BOLUS VIA INFUSION
0.0000 [IU] | Freq: Three times a day (TID) | INTRAVENOUS | Status: DC
  Filled 2015-01-29: qty 10

## 2015-01-29 MED ORDER — ROCURONIUM BROMIDE 100 MG/10ML IV SOLN
INTRAVENOUS | Status: DC | PRN
  Administered 2015-01-29: 100 mg via INTRAVENOUS
  Administered 2015-01-29: 30 mg via INTRAVENOUS
  Administered 2015-01-29: 50 mg via INTRAVENOUS
  Administered 2015-01-29: 60 mg via INTRAVENOUS
  Administered 2015-01-29: 40 mg via INTRAVENOUS

## 2015-01-29 MED ORDER — ROCURONIUM BROMIDE 50 MG/5ML IV SOLN
INTRAVENOUS | Status: AC
  Filled 2015-01-29: qty 2

## 2015-01-29 MED ORDER — METOCLOPRAMIDE HCL 5 MG/ML IJ SOLN
10.0000 mg | Freq: Four times a day (QID) | INTRAMUSCULAR | Status: DC
  Administered 2015-01-30 – 2015-01-31 (×6): 10 mg via INTRAVENOUS
  Filled 2015-01-29 (×6): qty 2

## 2015-01-29 MED ORDER — PLASMA-LYTE 148 IV SOLN
INTRAVENOUS | Status: DC
  Filled 2015-01-29: qty 2.5

## 2015-01-29 MED ORDER — METOPROLOL TARTRATE 12.5 MG HALF TABLET
12.5000 mg | ORAL_TABLET | Freq: Two times a day (BID) | ORAL | Status: DC
  Administered 2015-01-30: 12.5 mg via ORAL
  Filled 2015-01-29: qty 1

## 2015-01-29 MED ORDER — ACETAMINOPHEN 500 MG PO TABS
1000.0000 mg | ORAL_TABLET | Freq: Four times a day (QID) | ORAL | Status: DC
  Administered 2015-01-30 – 2015-02-03 (×15): 1000 mg via ORAL
  Filled 2015-01-29 (×13): qty 2

## 2015-01-29 MED ORDER — ACETAMINOPHEN 160 MG/5ML PO SOLN: 650.0000 mg | Freq: Once | ORAL | Status: AC

## 2015-01-29 MED ORDER — DOCUSATE SODIUM 100 MG PO CAPS
200.0000 mg | ORAL_CAPSULE | Freq: Every day | ORAL | Status: DC
  Administered 2015-01-30 – 2015-02-02 (×3): 200 mg via ORAL
  Filled 2015-01-29 (×5): qty 2

## 2015-01-29 MED ORDER — PANTOPRAZOLE SODIUM 40 MG PO TBEC
40.0000 mg | DELAYED_RELEASE_TABLET | Freq: Every day | ORAL | Status: DC
  Administered 2015-01-31 – 2015-02-03 (×4): 40 mg via ORAL
  Filled 2015-01-29 (×4): qty 1

## 2015-01-29 MED ORDER — FENTANYL CITRATE (PF) 250 MCG/5ML IJ SOLN
INTRAMUSCULAR | Status: AC
  Filled 2015-01-29: qty 10

## 2015-01-29 MED ORDER — DEXMEDETOMIDINE HCL IN NACL 200 MCG/50ML IV SOLN
0.0000 ug/kg/h | INTRAVENOUS | Status: DC
  Administered 2015-01-29: 0.6 ug/kg/h via INTRAVENOUS
  Filled 2015-01-29: qty 50

## 2015-01-29 MED ORDER — ONDANSETRON HCL 4 MG/2ML IJ SOLN: 4.0000 mg | Freq: Four times a day (QID) | INTRAMUSCULAR | Status: DC | PRN

## 2015-01-29 MED ORDER — SODIUM CHLORIDE 0.9 % IV SOLN
10.0000 g | INTRAVENOUS | Status: DC | PRN
  Administered 2015-01-29: 5 g/h via INTRAVENOUS

## 2015-01-29 MED ORDER — LACTATED RINGERS IV SOLN
500.0000 mL | Freq: Once | INTRAVENOUS | Status: DC | PRN
Start: 2015-01-29 — End: 2015-01-29

## 2015-01-29 MED ORDER — PHENYLEPHRINE HCL 10 MG/ML IJ SOLN
INTRAMUSCULAR | Status: DC | PRN
  Administered 2015-01-29: 20 ug via INTRAVENOUS

## 2015-01-29 MED ORDER — HEPARIN SODIUM (PORCINE) 1000 UNIT/ML IJ SOLN
INTRAMUSCULAR | Status: AC
  Filled 2015-01-29: qty 1

## 2015-01-29 MED FILL — Electrolyte-R (PH 7.4) Solution: INTRAVENOUS | Qty: 4000 | Status: AC

## 2015-01-29 MED FILL — Sodium Bicarbonate IV Soln 8.4%: INTRAVENOUS | Qty: 50 | Status: AC

## 2015-01-29 MED FILL — Heparin Sodium (Porcine) Inj 1000 Unit/ML: INTRAMUSCULAR | Qty: 10 | Status: AC

## 2015-01-29 MED FILL — Sodium Chloride IV Soln 0.9%: INTRAVENOUS | Qty: 2000 | Status: AC

## 2015-01-29 MED FILL — Lidocaine HCl IV Inj 20 MG/ML: INTRAVENOUS | Qty: 5 | Status: AC

## 2015-01-29 MED FILL — Mannitol IV Soln 20%: INTRAVENOUS | Qty: 500 | Status: AC

## 2015-01-29 SURGICAL SUPPLY — 113 items
ADAPTER CARDIO PERF ANTE/RETRO (ADAPTER) ×5 IMPLANT
APPLIER CLIP 9.375 SM OPEN (CLIP) ×5
BAG DECANTER FOR FLEXI CONT (MISCELLANEOUS) ×5 IMPLANT
BANDAGE ACE 4X5 VEL STRL LF (GAUZE/BANDAGES/DRESSINGS) ×10 IMPLANT
BANDAGE ACE 6X5 VEL STRL LF (GAUZE/BANDAGES/DRESSINGS) ×10 IMPLANT
BANDAGE ELASTIC 4 VELCRO ST LF (GAUZE/BANDAGES/DRESSINGS) ×5 IMPLANT
BANDAGE ELASTIC 6 VELCRO ST LF (GAUZE/BANDAGES/DRESSINGS) ×5 IMPLANT
BASKET HEART  (ORDER IN 25'S) (MISCELLANEOUS) ×1
BASKET HEART (ORDER IN 25'S) (MISCELLANEOUS) ×1
BASKET HEART (ORDER IN 25S) (MISCELLANEOUS) ×3 IMPLANT
BLADE STERNUM SYSTEM 6 (BLADE) ×5 IMPLANT
BLADE SURG 12 STRL SS (BLADE) ×5 IMPLANT
BLADE SURG 15 STRL LF DISP TIS (BLADE) ×3 IMPLANT
BLADE SURG 15 STRL SS (BLADE) ×2
BLADE SURG ROTATE 9660 (MISCELLANEOUS) IMPLANT
BNDG GAUZE ELAST 4 BULKY (GAUZE/BANDAGES/DRESSINGS) ×10 IMPLANT
CANISTER SUCTION 2500CC (MISCELLANEOUS) ×5 IMPLANT
CANNULA GUNDRY RCSP 15FR (MISCELLANEOUS) ×5 IMPLANT
CATH CPB KIT VANTRIGT (MISCELLANEOUS) ×5 IMPLANT
CATH ROBINSON RED A/P 18FR (CATHETERS) ×15 IMPLANT
CATH THORACIC 36FR RT ANG (CATHETERS) ×5 IMPLANT
CLIP APPLIE 9.375 SM OPEN (CLIP) ×3 IMPLANT
CLIP TI MEDIUM 24 (CLIP) IMPLANT
CLIP TI WIDE RED SMALL 24 (CLIP) IMPLANT
COVER MAYO STAND STRL (DRAPES) ×5 IMPLANT
COVER SURGICAL LIGHT HANDLE (MISCELLANEOUS) ×5 IMPLANT
CRADLE DONUT ADULT HEAD (MISCELLANEOUS) ×5 IMPLANT
DRAIN CHANNEL 32F RND 10.7 FF (WOUND CARE) ×5 IMPLANT
DRAPE CARDIOVASCULAR INCISE (DRAPES) ×2
DRAPE EXTREMITY T 121X128X90 (DRAPE) ×5 IMPLANT
DRAPE PROXIMA HALF (DRAPES) IMPLANT
DRAPE SLUSH/WARMER DISC (DRAPES) ×5 IMPLANT
DRAPE SRG 135X102X78XABS (DRAPES) ×3 IMPLANT
DRSG AQUACEL AG ADV 3.5X14 (GAUZE/BANDAGES/DRESSINGS) ×5 IMPLANT
ELECT BLADE 4.0 EZ CLEAN MEGAD (MISCELLANEOUS) ×5
ELECT BLADE 6.5 EXT (BLADE) ×5 IMPLANT
ELECT CAUTERY BLADE 6.4 (BLADE) ×5 IMPLANT
ELECT REM PT RETURN 9FT ADLT (ELECTROSURGICAL) ×10
ELECTRODE BLDE 4.0 EZ CLN MEGD (MISCELLANEOUS) ×3 IMPLANT
ELECTRODE REM PT RTRN 9FT ADLT (ELECTROSURGICAL) ×6 IMPLANT
GAUZE SPONGE 4X4 12PLY STRL (GAUZE/BANDAGES/DRESSINGS) ×10 IMPLANT
GEL ULTRASOUND 20GR AQUASONIC (MISCELLANEOUS) ×5 IMPLANT
GLOVE BIO SURGEON STRL SZ7.5 (GLOVE) ×15 IMPLANT
GLOVE BIOGEL M 6.5 STRL (GLOVE) ×30 IMPLANT
GOWN STRL REUS W/ TWL LRG LVL3 (GOWN DISPOSABLE) ×12 IMPLANT
GOWN STRL REUS W/TWL LRG LVL3 (GOWN DISPOSABLE) ×8
HARMONIC SHEARS 14CM COAG (MISCELLANEOUS) ×5 IMPLANT
HEMOSTAT POWDER SURGIFOAM 1G (HEMOSTASIS) ×15 IMPLANT
HEMOSTAT SURGICEL 2X14 (HEMOSTASIS) ×5 IMPLANT
INSERT FOGARTY XLG (MISCELLANEOUS) IMPLANT
KIT BASIN OR (CUSTOM PROCEDURE TRAY) ×5 IMPLANT
KIT ROOM TURNOVER OR (KITS) ×5 IMPLANT
KIT SUCTION CATH 14FR (SUCTIONS) ×5 IMPLANT
KIT VASOVIEW W/TROCAR VH 2000 (KITS) ×5 IMPLANT
LEAD PACING MYOCARDI (MISCELLANEOUS) ×5 IMPLANT
MARKER GRAFT CORONARY BYPASS (MISCELLANEOUS) ×15 IMPLANT
NS IRRIG 1000ML POUR BTL (IV SOLUTION) ×30 IMPLANT
PACK OPEN HEART (CUSTOM PROCEDURE TRAY) ×5 IMPLANT
PAD ARMBOARD 7.5X6 YLW CONV (MISCELLANEOUS) ×10 IMPLANT
PAD ELECT DEFIB RADIOL ZOLL (MISCELLANEOUS) ×5 IMPLANT
PENCIL BUTTON HOLSTER BLD 10FT (ELECTRODE) ×5 IMPLANT
PUNCH AORTIC ROTATE  4.5MM 8IN (MISCELLANEOUS) ×5 IMPLANT
PUNCH AORTIC ROTATE 4.0MM (MISCELLANEOUS) IMPLANT
PUNCH AORTIC ROTATE 4.5MM 8IN (MISCELLANEOUS) IMPLANT
PUNCH AORTIC ROTATE 5MM 8IN (MISCELLANEOUS) IMPLANT
SET CARDIOPLEGIA MPS 5001102 (MISCELLANEOUS) ×5 IMPLANT
SPONGE GAUZE 4X4 12PLY STER LF (GAUZE/BANDAGES/DRESSINGS) ×15 IMPLANT
SURGIFLO W/THROMBIN 8M KIT (HEMOSTASIS) ×5 IMPLANT
SUT BONE WAX W31G (SUTURE) ×5 IMPLANT
SUT ETHIBOND 2 0 SH (SUTURE) ×8
SUT ETHIBOND 2 0 SH 36X2 (SUTURE) ×12 IMPLANT
SUT MNCRL AB 4-0 PS2 18 (SUTURE) IMPLANT
SUT PROLENE 3 0 SH DA (SUTURE) IMPLANT
SUT PROLENE 3 0 SH1 36 (SUTURE) IMPLANT
SUT PROLENE 4 0 RB 1 (SUTURE) ×4
SUT PROLENE 4 0 SH DA (SUTURE) ×5 IMPLANT
SUT PROLENE 4-0 RB1 .5 CRCL 36 (SUTURE) ×6 IMPLANT
SUT PROLENE 5 0 C 1 36 (SUTURE) ×15 IMPLANT
SUT PROLENE 6 0 C 1 30 (SUTURE) ×15 IMPLANT
SUT PROLENE 6 0 CC (SUTURE) ×25 IMPLANT
SUT PROLENE 8 0 BV175 6 (SUTURE) ×20 IMPLANT
SUT PROLENE BLUE 7 0 (SUTURE) ×15 IMPLANT
SUT SILK  1 MH (SUTURE) ×6
SUT SILK 1 MH (SUTURE) ×9 IMPLANT
SUT SILK 1 TIES 10X30 (SUTURE) ×5 IMPLANT
SUT SILK 2 0 SH CR/8 (SUTURE) ×10 IMPLANT
SUT SILK 2 0 TIES 10X30 (SUTURE) ×5 IMPLANT
SUT SILK 2 0 TIES 17X18 (SUTURE) ×2
SUT SILK 2-0 18XBRD TIE BLK (SUTURE) ×3 IMPLANT
SUT SILK 3 0 SH CR/8 (SUTURE) ×5 IMPLANT
SUT SILK 4 0 TIE 10X30 (SUTURE) ×10 IMPLANT
SUT STEEL 6MS V (SUTURE) ×10 IMPLANT
SUT STEEL SZ 6 DBL 3X14 BALL (SUTURE) ×5 IMPLANT
SUT TEM PAC WIRE 2 0 SH (SUTURE) ×20 IMPLANT
SUT VIC AB 1 CTX 36 (SUTURE) ×4
SUT VIC AB 1 CTX36XBRD ANBCTR (SUTURE) ×6 IMPLANT
SUT VIC AB 2-0 CT1 27 (SUTURE)
SUT VIC AB 2-0 CT1 TAPERPNT 27 (SUTURE) IMPLANT
SUT VIC AB 2-0 CTX 27 (SUTURE) ×10 IMPLANT
SUT VIC AB 3-0 SH 27 (SUTURE)
SUT VIC AB 3-0 SH 27X BRD (SUTURE) IMPLANT
SUT VIC AB 3-0 X1 27 (SUTURE) ×10 IMPLANT
SUTURE E-PAK OPEN HEART (SUTURE) ×5 IMPLANT
SYR 50ML SLIP (SYRINGE) IMPLANT
SYSTEM SAHARA CHEST DRAIN ATS (WOUND CARE) ×5 IMPLANT
TAPE CLOTH SURG 4X10 WHT LF (GAUZE/BANDAGES/DRESSINGS) ×5 IMPLANT
TAPE PAPER 2X10 WHT MICROPORE (GAUZE/BANDAGES/DRESSINGS) ×5 IMPLANT
TOWEL OR 17X24 6PK STRL BLUE (TOWEL DISPOSABLE) ×10 IMPLANT
TOWEL OR 17X26 10 PK STRL BLUE (TOWEL DISPOSABLE) ×10 IMPLANT
TRAY FOLEY IC TEMP SENS 16FR (CATHETERS) ×5 IMPLANT
TUBING INSUFFLATION (TUBING) ×5 IMPLANT
UNDERPAD 30X30 INCONTINENT (UNDERPADS AND DIAPERS) ×5 IMPLANT
WATER STERILE IRR 1000ML POUR (IV SOLUTION) ×10 IMPLANT

## 2015-01-29 NOTE — Progress Notes (Signed)
  Echocardiogram Echocardiogram Transesophageal has been performed.  Darlina Sicilian M 01/29/2015, 8:33 AM

## 2015-01-29 NOTE — Progress Notes (Signed)
The patient was examined and preop studies reviewed. There has been no change from the prior exam and the patient is ready for surgery.   Plan CABG on J Esther for severe CAD

## 2015-01-29 NOTE — Anesthesia Postprocedure Evaluation (Signed)
Anesthesia Post Note  Patient: Juan [name]  Procedure(s) Performed: Procedure(s) (LRB): CORONARY ARTERY BYPASS GRAFTING (CABG)x four using left internal mammary artery artery and bilateral saphenous thigh vein using endoscope. (N/A) TRANSESOPHAGEAL ECHOCARDIOGRAM (TEE) (N/A)  Patient location during evaluation: PACU Anesthesia Type: General Level of consciousness: patient remains intubated per anesthesia plan Pain management: pain level controlled Vital Signs Assessment: post-procedure vital signs reviewed and stable Respiratory status: patient remains intubated per anesthesia plan Cardiovascular status: stable Postop Assessment: no signs of nausea or vomiting Anesthetic complications: no    Last Vitals:  Filed Vitals:   01/29/15 1515 01/29/15 1530  BP:    Pulse: 92 92  Temp: 35.8 C 36 C  Resp: 12 12    Last Pain: There were no vitals filed for this visit.               Maddyn Lieurance S

## 2015-01-29 NOTE — Anesthesia Preprocedure Evaluation (Addendum)
Anesthesia Evaluation  Patient identified by MRN, date of birth, ID band Patient awake    Reviewed: Allergy & Precautions, NPO status , Patient's Chart, lab work & pertinent test results  Airway Mallampati: II  TM Distance: >3 FB Neck ROM: Full    Dental no notable dental hx.    Pulmonary neg pulmonary ROS,    Pulmonary exam normal breath sounds clear to auscultation       Cardiovascular hypertension, Pt. on medications + CAD and + Past MI  Normal cardiovascular exam Rhythm:Regular Rate:Normal   - Left ventricle: The cavity size was normal. Systolic function was normal. The estimated ejection fraction was in the range of 50% to 55%. Wall motion was normal; there were no regional wall motion abnormalities. Doppler parameters are consistent with abnormal left ventricular relaxation (grade 1 diastolic dysfunction). - Aortic valve: Sclerosis without stenosis. - Left atrium: The atrium was mildly dilated.      Neuro/Psych negative neurological ROS  negative psych ROS   GI/Hepatic negative GI ROS, Neg liver ROS,   Endo/Other  diabetesHypothyroidism   Renal/GU negative Renal ROS  negative genitourinary   Musculoskeletal negative musculoskeletal ROS (+)   Abdominal   Peds negative pediatric ROS (+)  Hematology negative hematology ROS (+)   Anesthesia Other Findings   Reproductive/Obstetrics negative OB ROS                            Anesthesia Physical Anesthesia Plan  ASA: III  Anesthesia Plan: General   Post-op Pain Management:    Induction: Intravenous  Airway Management Planned: Oral ETT  Additional Equipment: Arterial line, PA Cath, TEE and Ultrasound Guidance Line Placement  Intra-op Plan:   Post-operative Plan: Post-operative intubation/ventilation  Informed Consent: I have reviewed the patients History and Physical, chart, labs and discussed the procedure  including the risks, benefits and alternatives for the proposed anesthesia with the patient or authorized representative who has indicated his/her understanding and acceptance.   Dental advisory given  Plan Discussed with: CRNA and Surgeon  Anesthesia Plan Comments:         Anesthesia Quick Evaluation

## 2015-01-29 NOTE — Procedures (Signed)
Extubation Procedure Note  Patient Details:   Name: Lemond Mckinney DOB: 21-Dec-1955 MRN: [id number]   Airway Documentation:     Evaluation  O2 sats: stable throughout Complications: No apparent complications Patient did tolerate procedure well. Bilateral Breath Sounds: Rales   Yes  4l/min [place] IS instructed 1531ml NIF-40/FVC-1.6  Revonda Standard 01/29/2015, 5:01 PM

## 2015-01-29 NOTE — Anesthesia Procedure Notes (Addendum)
Central Venous Catheter Insertion Performed by: anesthesiologist 01/29/2015 7:23 AM Patient location: Pre-op. Preanesthetic checklist: patient identified, IV checked, site marked, risks and benefits discussed, surgical consent, monitors and equipment checked, pre-op evaluation, timeout performed and anesthesia consent Lidocaine 1% used for infiltration Landmarks identified Catheter size: 8.5 Fr Sheath introducer Procedure performed using ultrasound guided technique. Attempts: 1 Following insertion, line sutured and dressing applied. Post procedure assessment: blood return through all ports, free fluid flow and no air. Patient tolerated the procedure well with no immediate complications.    Central Venous Catheter Insertion Performed by: anesthesiologist Patient location: Pre-op. Preanesthetic checklist: patient identified, IV checked, site marked, risks and benefits discussed, surgical consent, monitors and equipment checked, pre-op evaluation, timeout performed and anesthesia consent Position: supine Landmarks identified PA cath was placed.Swan type and PA catheter depth:thermodilation and 45PA Cath depth:45 Procedure performed using ultrasound guided technique. Attempts: 1 Patient tolerated the procedure well with no immediate complications.    Procedure Name: Intubation Date/Time: 01/29/2015 7:52 AM Performed by: Ollen Bowl Pre-anesthesia Checklist: Patient identified, Emergency Drugs available, Suction available, Patient being monitored and Timeout performed Patient Re-evaluated:Patient Re-evaluated prior to inductionOxygen Delivery Method: Circle system utilized and Simple face mask Preoxygenation: Pre-oxygenation with 100% oxygen Intubation Type: IV induction Ventilation: Mask ventilation without difficulty and Oral airway inserted - appropriate to patient size Laryngoscope Size: Miller and 3 Grade View: Grade II Tube type: Subglottic suction tube Tube size: 8.0  mm Number of attempts: 1 Airway Equipment and Method: Patient positioned with wedge pillow and Stylet Placement Confirmation: ETT inserted through vocal cords under direct vision,  positive ETCO2 and breath sounds checked- equal and bilateral Secured at: 22 cm Tube secured with: Tape Dental Injury: Teeth and Oropharynx as per pre-operative assessment

## 2015-01-29 NOTE — Transfer of Care (Signed)
Immediate Anesthesia Transfer of Care Note  Patient: Broderic Cullimore  Procedure(s) Performed: Procedure(s): CORONARY ARTERY BYPASS GRAFTING (CABG)x four using left internal mammary artery artery and bilateral saphenous thigh vein using endoscope. (N/A) TRANSESOPHAGEAL ECHOCARDIOGRAM (TEE) (N/A)  Patient Location: SICU  Anesthesia Type:General  Level of Consciousness: Patient remains intubated per anesthesia plan  Airway & Oxygen Therapy: Patient remains intubated per anesthesia plan and Patient placed on Ventilator (see vital sign flow sheet for setting)  Post-op Assessment: Report given to RN and Post -op Vital signs reviewed and stable  Post vital signs: Reviewed and stable  Last Vitals:  Filed Vitals:   01/29/15 0553  BP: 119/74  Pulse: 74  Temp: 36.6 C  Resp: 18    Complications: No apparent anesthesia complications

## 2015-01-29 NOTE — Progress Notes (Signed)
CT surgery p.m. Rounds  Patient had urgent CABG 4 today Extubated with stable hemodynamics Remains on IV insulin drip for poorly controlled preoperative diabetes

## 2015-01-29 NOTE — Brief Op Note (Signed)
01/29/2015  12:09 PM  PATIENT:  Georgia Lopes  59 y.o. male  PRE-OPERATIVE DIAGNOSIS:  CAD  POST-OPERATIVE DIAGNOSIS:  CAD  PROCEDURE: TRANSESOPHAGEAL ECHOCARDIOGRAM (TEE), MEDIAN STERNOTOMY for CORONARY ARTERY BYPASS GRAFTING (CABG) x 4 (LIMA to LAD, SVG to OM1, SVG to OM2, SVG to RCA) using left internal mammary artery artery and bilateral greater saphenous thigh vein using endoscope.   SURGEON:  Surgeon(s) and Role:    * Ivin Poot, MD - Primary  PHYSICIAN ASSISTANT: Lars Pinks PA-C  ANESTHESIA:   general  EBL:  Total I/O In: 1000 [I.V.:1000] Out: 925 [Urine:925]  BLOOD ADMINISTERED:Two FFP  DRAINS: Chest tubes placed in the mediastinal and pleural spaces   COUNTS CORRECT:  YES  DICTATION: .Dragon Dictation  PLAN OF CARE: Admit to inpatient   PATIENT DISPOSITION:  ICU - intubated and hemodynamically stable.   Delay start of Pharmacological VTE agent (>24hrs) due to surgical blood loss or risk of bleeding: yes  BASELINE WEIGHT: 90 kg

## 2015-01-29 NOTE — Op Note (Signed)
Juan [name], YOUNGHANS NO.:  [id number]  MEDICAL RECORD NO.:  [id number]  LOCATION:  2S02C                        FACILITY:  [facility]  PHYSICIAN:  Ivin Poot, M.D.  DATE OF BIRTH:  05-May-1955  DATE OF PROCEDURE: DATE OF DISCHARGE:                              OPERATIVE REPORT   OPERATION: 1. Coronary artery bypass grafting x4 (left internal mammary artery to     LAD, saphenous vein graft to posterior descending, saphenous vein     graft to OM1, saphenous vein graft to OM2). 2. Endoscopic harvest of bilateral leg greater saphenous vein.  SURGEON:  Ivin Poot, MD  ASSISTANT:  Lars Pinks, PA-C  ANESTHESIA:  General by Dr. Cleon Dew. Rose, MD.  PREOPERATIVE DIAGNOSIS:  Severe three-vessel coronary artery disease with a diffuse diabetic pattern, prior remote percutaneous coronary intervention, unstable angina.  POSTOPERATIVE DIAGNOSIS:  Severe three-vessel coronary artery disease with a diffuse diabetic pattern, prior remote percutaneous coronary intervention, unstable angina.  CLINICAL NOTE:  The patient is a 59 year old, diabetic with poor clinical control of his diabetes since he stopped taking his medicines approximately 2 weeks prior to his presentation with unstable angina. Catheterization demonstrated severe 3-vessel coronary artery disease with diffuse involvement of the previously placed stents in all major vessel distributions.  Echo showed fairly well preserved LV function. He was not felt to be a candidate for more PCI and surgical revascularization was recommended.  I reviewed the patient's cath and examined and counseled the patient in the office with his wife and discussed the indications and expected benefits of coronary artery bypass grafting for treatment of his severe coronary artery disease.  I reviewed the alternatives to surgical therapy as well.  I discussed with the patient the major aspects of surgery including the use  of general anesthesia, location of the surgical incisions, the use of cardiopulmonary bypass, and the expected postoperative hospital recovery.  I discussed with him the potential risks of the operation including risk of stroke, bleeding, blood transfusion requirement, postoperative pulmonary problems including pleural effusions, infection because of his poorly controlled diabetes, and stroke and death.  After reviewing these issues, he demonstrated his understanding and agreed to proceed with surgery under what I felt was an informed consent.  OPERATIVE PROCEDURE:  The patient was brought to the operating room, placed supine on the operating table.  General anesthesia was induced. The chest, abdomen, left arm, and legs were prepped and draped as a sterile field.  A proper time-out was performed.  The left arm was draped to have accessibility to left radial artery for conduit if that was needed-as if the saphenous vein was not of good quality. A sternal incision was made as the saphenous vein was harvested from both legs.  Left IMA was harvested as a pedicle graft from its origin at the subclavian vessels.  It was 1.5-mm vessel with good flow.  The sternal retractor was placed and pericardium was opened and suspended. Pursestrings were placed in the ascending aorta and right atrium. Heparin was administered.  When the ACT was documented as being therapeutic, the patient was cannulated and placed on cardiopulmonary bypass.  The cornua were identified for grafting of the  mammary artery and vein grafts were prepared for the distal anastomoses.  Cardioplegia cannulas were placed both antegrade and retrograde cold blood cardioplegia.  The patient was cooled to 32 degrees and an aortic crossclamp was applied.  One liter of cold blood cardioplegia was delivered in split doses between the antegrade aortic and retrograde coronary sinus catheters.  There was good cardioplegic arrest and  septal temperature dropped less than 12 degrees.  The distal coronary anastomoses were performed.  The first distal anastomosis was in posterior descending.  This was a very small heavily diseased vessel with proximal 90% stenosis in proximal stents.  A reverse saphenous vein was sewn end-to-side with running 7-0 Prolene, good flow through the graft.  The second distal anastomosis was the OM2 branch of the left circumflex. This was 1.3 mm vessel intramyocardial with heavily diseased proximal stents.  A reverse saphenous vein was sewn end-to-side with running 7-0 Prolene with adequate satisfactory flow through the graft.  Cardioplegia was redosed.  Third distal anastomosis was to the OM1 branch of the left circumflex. This was intramyocardial.  It was heavily diseased proximally with heavily diseased stents.  A reverse saphenous vein was sewn end-to-side to this 1.5-mm vessel with 7-0 Prolene.  There was good flow through the graft.  Cardioplegia was redosed.  The fourth distal anastomosis was the distal LAD past the previously placed stents.  The mammary artery was opened by releasing the bulldog on the vascular pedicle and there was excellent flow through the anastomosis.  The bulldog was reapplied and the pedicle was secured to the epicardium with 6-0 Prolene.  Cardioplegia was redosed.  While the crossclamp was still in place, 3 proximal vein anastomoses were performed on the ascending aorta using a 4.5 mm punch running 6-0 Prolene.  Prior to removing the cross-clamp, air was vented from the coronaries and with a dose of retrograde warm blood cardioplegia.  The crossclamp was removed.  The heart resumed a spontaneous rhythm.  The vein grafts were de-aired and opened.  Each had good flow and hemostasis was documented of proximal distal anastomoses.  The patient was reperfused and rewarmed.  Temporary pacing wires were applied.  The lungs were expanded and ventilator was  resumed.  When the patient had been adequately reperfused, he was weaned off cardiopulmonary bypass without difficulty.  Echo showed good LV function.  The patient remained hemodynamically stable.  Protamine was administered without adverse reaction.  The cannula was removed.  The mediastinum was irrigated with sterile saline.  The superior pericardial fat was closed over the aorta.  Anterior mediastinal left pleural chest tube were placed and brought out through separate incisions.  The sternum was closed with a steel wire.  The pectoralis fascia was closed with a running #1 Vicryl.  Subcutaneous and skin layers were closed with running Vicryl and sterile dressings were applied.  Total cardiopulmonary bypass time was of 150 minutes.     Ivin Poot, M.D.     PV/MEDQ  D:  01/29/2015  T:  01/29/2015  Job:  [id number]  cc:   Kathlyn Sacramento, MD

## 2015-01-29 NOTE — Progress Notes (Signed)
RT note-sicu wean initiated

## 2015-01-30 ENCOUNTER — Inpatient Hospital Stay (HOSPITAL_COMMUNITY): Payer: BLUE CROSS/BLUE SHIELD

## 2015-01-30 LAB — POCT I-STAT, CHEM 8
BUN: 8 mg/dL (ref 6–20)
Calcium, Ion: 1.14 mmol/L (ref 1.12–1.23)
Chloride: 98 mmol/L — ABNORMAL LOW (ref 101–111)
Creatinine, Ser: 0.6 mg/dL — ABNORMAL LOW (ref 0.61–1.24)
Glucose, Bld: 172 mg/dL — ABNORMAL HIGH (ref 65–99)
HCT: 38 % — ABNORMAL LOW (ref 39.0–52.0)
Hemoglobin: 12.9 g/dL — ABNORMAL LOW (ref 13.0–17.0)
Potassium: 3.9 mmol/L (ref 3.5–5.1)
Sodium: 137 mmol/L (ref 135–145)
TCO2: 24 mmol/L (ref 0–100)

## 2015-01-30 LAB — GLUCOSE, CAPILLARY
Glucose-Capillary: 104 mg/dL — ABNORMAL HIGH (ref 65–99)
Glucose-Capillary: 120 mg/dL — ABNORMAL HIGH (ref 65–99)
Glucose-Capillary: 151 mg/dL — ABNORMAL HIGH (ref 65–99)
Glucose-Capillary: 101 mg/dL — ABNORMAL HIGH (ref 65–99)
Glucose-Capillary: 101 mg/dL — ABNORMAL HIGH (ref 65–99)
Glucose-Capillary: 103 mg/dL — ABNORMAL HIGH (ref 65–99)
Glucose-Capillary: 108 mg/dL — ABNORMAL HIGH (ref 65–99)
Glucose-Capillary: 111 mg/dL — ABNORMAL HIGH (ref 65–99)
Glucose-Capillary: 112 mg/dL — ABNORMAL HIGH (ref 65–99)
Glucose-Capillary: 112 mg/dL — ABNORMAL HIGH (ref 65–99)
Glucose-Capillary: 117 mg/dL — ABNORMAL HIGH (ref 65–99)
Glucose-Capillary: 119 mg/dL — ABNORMAL HIGH (ref 65–99)
Glucose-Capillary: 126 mg/dL — ABNORMAL HIGH (ref 65–99)
Glucose-Capillary: 128 mg/dL — ABNORMAL HIGH (ref 65–99)
Glucose-Capillary: 134 mg/dL — ABNORMAL HIGH (ref 65–99)
Glucose-Capillary: 99 mg/dL (ref 65–99)
Glucose-Capillary: 162 mg/dL — ABNORMAL HIGH (ref 65–99)
Glucose-Capillary: 213 mg/dL — ABNORMAL HIGH (ref 65–99)

## 2015-01-30 LAB — PREPARE FRESH FROZEN PLASMA
Unit division: 0
Unit division: 0

## 2015-01-30 LAB — CBC
HCT: 32.7 % — ABNORMAL LOW (ref 39.0–52.0)
HEMATOCRIT: 35.9 % — AB (ref 39.0–52.0)
Hemoglobin: 11.2 g/dL — ABNORMAL LOW (ref 13.0–17.0)
Hemoglobin: 12.4 g/dL — ABNORMAL LOW (ref 13.0–17.0)
MCH: 31.2 pg (ref 26.0–34.0)
MCH: 32 pg (ref 26.0–34.0)
MCHC: 34.3 g/dL (ref 30.0–36.0)
MCHC: 34.5 g/dL (ref 30.0–36.0)
MCV: 91.1 fL (ref 78.0–100.0)
MCV: 92.5 fL (ref 78.0–100.0)
Platelets: 96 10*3/uL — ABNORMAL LOW (ref 150–400)
Platelets: 95 10*3/uL — ABNORMAL LOW (ref 150–400)
RBC: 3.59 MIL/uL — ABNORMAL LOW (ref 4.22–5.81)
RBC: 3.88 MIL/uL — ABNORMAL LOW (ref 4.22–5.81)
RDW: 13.5 % (ref 11.5–15.5)
RDW: 14 % (ref 11.5–15.5)
WBC: 7.6 10*3/uL (ref 4.0–10.5)
WBC: 10.1 10*3/uL (ref 4.0–10.5)

## 2015-01-30 LAB — BASIC METABOLIC PANEL
Anion gap: 7 (ref 5–15)
BUN: 10 mg/dL (ref 6–20)
CALCIUM: 8.4 mg/dL — AB (ref 8.9–10.3)
CO2: 25 mmol/L (ref 22–32)
CREATININE: 0.61 mg/dL (ref 0.61–1.24)
Chloride: 108 mmol/L (ref 101–111)
GFR calc non Af Amer: >60 mL/min (ref >60)
Glucose, Bld: 103 mg/dL — ABNORMAL HIGH (ref 65–99)
Potassium: 4.2 mmol/L (ref 3.5–5.1)
SODIUM: 140 mmol/L (ref 135–145)

## 2015-01-30 LAB — MAGNESIUM
MAGNESIUM: 2.4 mg/dL (ref 1.7–2.4)
MAGNESIUM: 1.9 mg/dL (ref 1.7–2.4)

## 2015-01-30 LAB — CREATININE, SERUM: CREATININE: 0.73 mg/dL (ref 0.61–1.24)

## 2015-01-30 MED ORDER — AMIODARONE LOAD VIA INFUSION
150.0000 mg | Freq: Once | INTRAVENOUS | Status: AC
  Administered 2015-01-30: 150 mg via INTRAVENOUS
  Filled 2015-01-30: qty 83.34

## 2015-01-30 MED ORDER — METOPROLOL TARTRATE 25 MG PO TABS
25.0000 mg | ORAL_TABLET | Freq: Two times a day (BID) | ORAL | Status: DC
  Administered 2015-01-30 – 2015-02-01 (×5): 25 mg via ORAL
  Filled 2015-01-30 (×5): qty 1

## 2015-01-30 MED ORDER — CLOPIDOGREL BISULFATE 75 MG PO TABS
75.0000 mg | ORAL_TABLET | Freq: Every day | ORAL | Status: DC
  Administered 2015-01-31 – 2015-02-03 (×4): 75 mg via ORAL
  Filled 2015-01-30 (×4): qty 1

## 2015-01-30 MED ORDER — INSULIN ASPART 100 UNIT/ML ~~LOC~~ SOLN
4.0000 [IU] | Freq: Three times a day (TID) | SUBCUTANEOUS | Status: DC
  Administered 2015-01-31: 4 [IU] via SUBCUTANEOUS

## 2015-01-30 MED ORDER — INSULIN ASPART 100 UNIT/ML ~~LOC~~ SOLN
0.0000 [IU] | SUBCUTANEOUS | Status: DC
  Administered 2015-01-30: 8 [IU] via SUBCUTANEOUS
  Administered 2015-01-30 – 2015-01-31 (×4): 4 [IU] via SUBCUTANEOUS

## 2015-01-30 MED ORDER — MORPHINE SULFATE (PF) 2 MG/ML IV SOLN
1.0000 mg | INTRAVENOUS | Status: AC | PRN
  Administered 2015-01-30: 4 mg via INTRAVENOUS
  Filled 2015-01-30: qty 2

## 2015-01-30 MED ORDER — FUROSEMIDE 10 MG/ML IJ SOLN
40.0000 mg | Freq: Every day | INTRAMUSCULAR | Status: DC
  Administered 2015-01-30: 40 mg via INTRAVENOUS

## 2015-01-30 MED ORDER — AMIODARONE HCL IN DEXTROSE 360-4.14 MG/200ML-% IV SOLN
60.0000 mg/h | INTRAVENOUS | Status: AC
  Administered 2015-01-30: 60 mg/h via INTRAVENOUS
  Filled 2015-01-30 (×2): qty 200

## 2015-01-30 MED ORDER — AMIODARONE HCL IN DEXTROSE 360-4.14 MG/200ML-% IV SOLN
30.0000 mg/h | INTRAVENOUS | Status: DC
  Administered 2015-01-31: 30 mg/h via INTRAVENOUS
  Filled 2015-01-30: qty 200

## 2015-01-30 MED ORDER — INSULIN DETEMIR 100 UNIT/ML ~~LOC~~ SOLN
14.0000 [IU] | Freq: Two times a day (BID) | SUBCUTANEOUS | Status: DC
  Administered 2015-01-30 (×2): 14 [IU] via SUBCUTANEOUS
  Filled 2015-01-30 (×4): qty 0.14

## 2015-01-30 NOTE — Progress Notes (Signed)
1 Day Post-Op Procedure(s) (LRB): CORONARY ARTERY BYPASS GRAFTING (CABG)x four using left internal mammary artery artery and bilateral saphenous thigh vein using endoscope. (N/A) TRANSESOPHAGEAL ECHOCARDIOGRAM (TEE) (N/A) Subjective: Doing well after CABG Hx poorly controlled DM  Objective: Vital signs in last 24 hours: Temp:  [96.4 F (35.8 C)-99 F (37.2 C)] 98.6 F (37 C) (12/31 0800) Pulse Rate:  [81-109] 87 (12/31 0800) Cardiac Rhythm:  [-] Normal sinus rhythm (12/31 0800) Resp:  [12-33] 13 (12/31 0800) BP: (87-113)/(54-76) 96/57 mmHg (12/31 0800) SpO2:  [97 %-100 %] 99 % (12/31 0800) Arterial Line BP: (100-173)/(41-68) 120/46 mmHg (12/31 0800) FiO2 (%):  [40 %-50 %] 40 % (12/30 1632) Weight:  [205 lb 4 oz (93.1 kg)] 205 lb 4 oz (93.1 kg) (12/31 0625)  Hemodynamic parameters for last 24 hours: PAP: (17-40)/(7-19) 24/11 mmHg CO:  [4.8 L/min-9 L/min] 7.7 L/min CI:  [2.3 L/min/m2-4.4 L/min/m2] 3.8 L/min/m2  Intake/Output from previous day: 12/30 0701 - 12/31 0700 In: 6708.9 [I.V.:3473.9; Blood:1155; NG/GT:30; IV Piggyback:2050] Out: [id number] [Urine:2910; Blood:1685; Chest Tube:550] Intake/Output this shift: Total I/O In: 66.2 [I.V.:66.2] Out: 50 [Urine:50]  NSR Neuro intact Mild edema  Lab Results:  Recent Labs  01/29/15 2048 01/30/15 0449  WBC 11.4* 7.6  HGB 12.6* 11.2*  HCT 35.9* 32.7*  PLT 117* 96*   BMET:  Recent Labs  01/27/15 1534  01/29/15 2034 01/29/15 2048 01/30/15 0449  NA 135  < > 140  --  140  K 3.7  < > 4.1  --  4.2  CL 102  < > 105  --  108  CO2 21*  --   --   --  25  GLUCOSE 307*  < > 115*  --  103*  BUN 13  < > 14  --  10  CREATININE 0.73  < > 0.60* 0.60* 0.61  CALCIUM 9.2  --   --   --  8.4*  < > = values in this interval not displayed.  PT/INR:  Recent Labs  01/29/15 1438  LABPROT 19.4*  INR 1.64*   ABG    Component Value Date/Time   PHART 7.355 01/29/2015 1759   HCO3 23.5 01/29/2015 1759   TCO2 24 01/29/2015 2034   ACIDBASEDEF 2.0 01/29/2015 1759   O2SAT 98.0 01/29/2015 1759   CBG (last 3)   Recent Labs  01/29/15 2208 01/29/15 2304 01/30/15 0008  GLUCAP 104* 151* 120*    Assessment/Plan: S/P Procedure(s) (LRB): CORONARY ARTERY BYPASS GRAFTING (CABG)x four using left internal mammary artery artery and bilateral saphenous thigh vein using endoscope. (N/A) TRANSESOPHAGEAL ECHOCARDIOGRAM (TEE) (N/A) Mobilize Diuresis Diabetes control d/c tubes/lines See progression orders plan postop plavix 30 days, 81mg  asa  Follow plt count   LOS: 1 day    Tharon Aquas Trigt III 01/30/2015

## 2015-01-30 NOTE — Progress Notes (Signed)
CT surgery p.m. Rounds  Patient progressed well today Currently and it tachycardia  130-1 40 bpm We'll start amiodarone protocol P.m. labs reviewed and are satisfactory Blood sugars adequately controlled

## 2015-01-31 ENCOUNTER — Inpatient Hospital Stay (HOSPITAL_COMMUNITY): Payer: BLUE CROSS/BLUE SHIELD

## 2015-01-31 LAB — BASIC METABOLIC PANEL
Anion gap: 8 (ref 5–15)
BUN: 8 mg/dL (ref 6–20)
CO2: 26 mmol/L (ref 22–32)
Calcium: 8.7 mg/dL — ABNORMAL LOW (ref 8.9–10.3)
Chloride: 103 mmol/L (ref 101–111)
Creatinine, Ser: 0.82 mg/dL (ref 0.61–1.24)
GFR calc Af Amer: >60 mL/min (ref >60)
GFR calc non Af Amer: >60 mL/min (ref >60)
Glucose, Bld: 202 mg/dL — ABNORMAL HIGH (ref 65–99)
Potassium: 3.7 mmol/L (ref 3.5–5.1)
Sodium: 137 mmol/L (ref 135–145)

## 2015-01-31 LAB — GLUCOSE, CAPILLARY
Glucose-Capillary: 177 mg/dL — ABNORMAL HIGH (ref 65–99)
Glucose-Capillary: 186 mg/dL — ABNORMAL HIGH (ref 65–99)
Glucose-Capillary: 188 mg/dL — ABNORMAL HIGH (ref 65–99)
Glucose-Capillary: 190 mg/dL — ABNORMAL HIGH (ref 65–99)
Glucose-Capillary: 193 mg/dL — ABNORMAL HIGH (ref 65–99)
Glucose-Capillary: 217 mg/dL — ABNORMAL HIGH (ref 65–99)
Glucose-Capillary: 227 mg/dL — ABNORMAL HIGH (ref 65–99)

## 2015-01-31 LAB — CBC
HCT: 33.3 % — ABNORMAL LOW (ref 39.0–52.0)
Hemoglobin: 11.4 g/dL — ABNORMAL LOW (ref 13.0–17.0)
MCH: 31.9 pg (ref 26.0–34.0)
MCHC: 34.2 g/dL (ref 30.0–36.0)
MCV: 93.3 fL (ref 78.0–100.0)
Platelets: 83 10*3/uL — ABNORMAL LOW (ref 150–400)
RBC: 3.57 MIL/uL — ABNORMAL LOW (ref 4.22–5.81)
RDW: 14 % (ref 11.5–15.5)
WBC: 9 10*3/uL (ref 4.0–10.5)

## 2015-01-31 MED ORDER — POTASSIUM CHLORIDE 10 MEQ/50ML IV SOLN
10.0000 meq | INTRAVENOUS | Status: AC | PRN
  Administered 2015-01-31 (×3): 10 meq via INTRAVENOUS

## 2015-01-31 MED ORDER — AMIODARONE HCL 200 MG PO TABS
400.0000 mg | ORAL_TABLET | Freq: Two times a day (BID) | ORAL | Status: DC
  Administered 2015-01-31 – 2015-02-03 (×7): 400 mg via ORAL
  Filled 2015-01-31 (×7): qty 2

## 2015-01-31 MED ORDER — MOVING RIGHT ALONG BOOK
Freq: Once | Status: AC
  Administered 2015-01-31: 12:00:00
  Filled 2015-01-31: qty 1

## 2015-01-31 MED ORDER — SODIUM CHLORIDE 0.9 % IJ SOLN
3.0000 mL | Freq: Two times a day (BID) | INTRAMUSCULAR | Status: DC
  Administered 2015-01-31 – 2015-02-02 (×7): 3 mL via INTRAVENOUS

## 2015-01-31 MED ORDER — POTASSIUM CHLORIDE CRYS ER 20 MEQ PO TBCR
20.0000 meq | EXTENDED_RELEASE_TABLET | Freq: Every day | ORAL | Status: DC
  Administered 2015-02-01: 20 meq via ORAL
  Filled 2015-01-31: qty 1

## 2015-01-31 MED ORDER — FUROSEMIDE 10 MG/ML IJ SOLN: 20.0000 mg | Freq: Once | INTRAMUSCULAR | Status: AC

## 2015-01-31 MED ORDER — SODIUM CHLORIDE 0.9 % IJ SOLN: 3.0000 mL | INTRAMUSCULAR | Status: DC | PRN

## 2015-01-31 MED ORDER — MAGNESIUM HYDROXIDE 400 MG/5ML PO SUSP: 30.0000 mL | Freq: Every day | ORAL | Status: DC | PRN

## 2015-01-31 MED ORDER — METOCLOPRAMIDE HCL 10 MG PO TABS
10.0000 mg | ORAL_TABLET | Freq: Three times a day (TID) | ORAL | Status: DC
  Administered 2015-01-31 – 2015-02-03 (×10): 10 mg via ORAL
  Filled 2015-01-31 (×10): qty 1

## 2015-01-31 MED ORDER — INSULIN ASPART 100 UNIT/ML ~~LOC~~ SOLN
0.0000 [IU] | Freq: Three times a day (TID) | SUBCUTANEOUS | Status: DC
  Administered 2015-01-31 (×2): 4 [IU] via SUBCUTANEOUS
  Administered 2015-01-31: 8 [IU] via SUBCUTANEOUS
  Administered 2015-02-01 (×2): 4 [IU] via SUBCUTANEOUS
  Administered 2015-02-01: 12 [IU] via SUBCUTANEOUS
  Administered 2015-02-01 – 2015-02-02 (×2): 4 [IU] via SUBCUTANEOUS
  Administered 2015-02-02 – 2015-02-03 (×3): 2 [IU] via SUBCUTANEOUS

## 2015-01-31 MED ORDER — ALUM & MAG HYDROXIDE-SIMETH 200-200-20 MG/5ML PO SUSP: 15.0000 mL | ORAL | Status: DC | PRN

## 2015-01-31 MED ORDER — SODIUM CHLORIDE 0.9 % IV SOLN: 250.0000 mL | INTRAVENOUS | Status: DC | PRN

## 2015-01-31 MED ORDER — ATORVASTATIN CALCIUM 20 MG PO TABS
20.0000 mg | ORAL_TABLET | Freq: Every day | ORAL | Status: DC
  Administered 2015-02-02: 20 mg via ORAL
  Filled 2015-01-31 (×2): qty 1

## 2015-01-31 MED ORDER — INSULIN DETEMIR 100 UNIT/ML ~~LOC~~ SOLN
20.0000 [IU] | Freq: Two times a day (BID) | SUBCUTANEOUS | Status: DC
  Administered 2015-01-31 (×2): 20 [IU] via SUBCUTANEOUS
  Filled 2015-01-31 (×4): qty 0.2

## 2015-01-31 MED ORDER — FUROSEMIDE 40 MG PO TABS
40.0000 mg | ORAL_TABLET | Freq: Every day | ORAL | Status: DC
Start: 2015-02-01 — End: 2015-02-03
  Administered 2015-02-01 – 2015-02-03 (×3): 40 mg via ORAL
  Filled 2015-01-31 (×3): qty 1

## 2015-01-31 MED ORDER — FUROSEMIDE 10 MG/ML IJ SOLN
20.0000 mg | Freq: Every day | INTRAMUSCULAR | Status: DC
  Administered 2015-01-31: 20 mg via INTRAVENOUS
  Filled 2015-01-31: qty 2

## 2015-01-31 NOTE — Progress Notes (Signed)
2 Days Post-Op Procedure(s) (LRB): CORONARY ARTERY BYPASS GRAFTING (CABG)x four using left internal mammary artery artery and bilateral saphenous thigh vein using endoscope. (N/A) TRANSESOPHAGEAL ECHOCARDIOGRAM (TEE) (N/A) Subjective: Stable night nsr cxr clear  Objective: Vital signs in last 24 hours: Temp:  [98.5 F (36.9 C)-100.2 F (37.9 C)] 98.6 F (37 C) (01/01 0744) Pulse Rate:  [82-125] 95 (01/01 0700) Cardiac Rhythm:  [-] Normal sinus rhythm (01/01 0600) Resp:  [16-30] 18 (01/01 0700) BP: (98-156)/(61-80) 114/61 mmHg (01/01 0700) SpO2:  [95 %-100 %] 97 % (01/01 0700) Arterial Line BP: (151-159)/(50-60) 151/50 mmHg (12/31 1000) Weight:  [193 lb 5.5 oz (87.7 kg)] 193 lb 5.5 oz (87.7 kg) (01/01 0400)  Hemodynamic parameters for last 24 hours: PAP: (25-35)/(10-18) 28/11 mmHg  Intake/Output from previous day: 12/31 0701 - 01/01 0700 In: 2097.9 [P.O.:680; I.V.:817.9; IV Piggyback:600] Out: 3770 [Urine:3320; Chest Tube:450] Intake/Output this shift:    Lungs clear abd soft extrem warm  Lab Results:  Recent Labs  01/30/15 1600 01/30/15 1603 01/31/15 0400  WBC 10.1  --  9.0  HGB 12.4* 12.9* 11.4*  HCT 35.9* 38.0* 33.3*  PLT 95*  --  83*   BMET:  Recent Labs  01/30/15 0449  01/30/15 1603 01/31/15 0400  NA 140  --  137 137  K 4.2  --  3.9 3.7  CL 108  --  98* 103  CO2 25  --   --  26  GLUCOSE 103*  --  172* 202*  BUN 10  --  8 8  CREATININE 0.61  < > 0.60* 0.82  CALCIUM 8.4*  --   --  8.7*  < > = values in this interval not displayed.  PT/INR:  Recent Labs  01/29/15 1438  LABPROT 19.4*  INR 1.64*   ABG    Component Value Date/Time   PHART 7.355 01/29/2015 1759   HCO3 23.5 01/29/2015 1759   TCO2 24 01/30/2015 1603   ACIDBASEDEF 2.0 01/29/2015 1759   O2SAT 98.0 01/29/2015 1759   CBG (last 3)   Recent Labs  01/30/15 2341 01/31/15 0344 01/31/15 0740  GLUCAP 177* 188* 193*    Assessment/Plan: S/P Procedure(s) (LRB): CORONARY ARTERY  BYPASS GRAFTING (CABG)x four using left internal mammary artery artery and bilateral saphenous thigh vein using endoscope. (N/A) TRANSESOPHAGEAL ECHOCARDIOGRAM (TEE) (N/A) Mobilize Diuresis Diabetes control d/c tubes/lines Plan for transfer to step-down: see transfer orders  Hold plavix until plts > 90   LOS: 2 days    Juan [name] 01/31/2015

## 2015-02-01 ENCOUNTER — Inpatient Hospital Stay (HOSPITAL_COMMUNITY): Payer: BLUE CROSS/BLUE SHIELD

## 2015-02-01 LAB — BASIC METABOLIC PANEL
Anion gap: 10 (ref 5–15)
BUN: 15 mg/dL (ref 6–20)
CO2: 25 mmol/L (ref 22–32)
Calcium: 8.7 mg/dL — ABNORMAL LOW (ref 8.9–10.3)
Chloride: 102 mmol/L (ref 101–111)
Creatinine, Ser: 0.66 mg/dL (ref 0.61–1.24)
GFR calc Af Amer: >60 mL/min (ref >60)
GFR calc non Af Amer: >60 mL/min (ref >60)
Glucose, Bld: 177 mg/dL — ABNORMAL HIGH (ref 65–99)
Potassium: 3.9 mmol/L (ref 3.5–5.1)
Sodium: 137 mmol/L (ref 135–145)

## 2015-02-01 LAB — CBC
HCT: 34.5 % — ABNORMAL LOW (ref 39.0–52.0)
Hemoglobin: 11.7 g/dL — ABNORMAL LOW (ref 13.0–17.0)
MCH: 31.4 pg (ref 26.0–34.0)
MCHC: 33.9 g/dL (ref 30.0–36.0)
MCV: 92.5 fL (ref 78.0–100.0)
Platelets: 116 10*3/uL — ABNORMAL LOW (ref 150–400)
RBC: 3.73 MIL/uL — ABNORMAL LOW (ref 4.22–5.81)
RDW: 13.9 % (ref 11.5–15.5)
WBC: 9.3 10*3/uL (ref 4.0–10.5)

## 2015-02-01 LAB — GLUCOSE, CAPILLARY
Glucose-Capillary: 182 mg/dL — ABNORMAL HIGH (ref 65–99)
Glucose-Capillary: 185 mg/dL — ABNORMAL HIGH (ref 65–99)
Glucose-Capillary: 176 mg/dL — ABNORMAL HIGH (ref 65–99)
Glucose-Capillary: 252 mg/dL — ABNORMAL HIGH (ref 65–99)

## 2015-02-01 MED ORDER — METFORMIN HCL ER 500 MG PO TB24
500.0000 mg | ORAL_TABLET | Freq: Two times a day (BID) | ORAL | Status: DC
  Administered 2015-02-01 – 2015-02-03 (×5): 500 mg via ORAL
  Filled 2015-02-01 (×5): qty 1

## 2015-02-01 MED ORDER — POTASSIUM CHLORIDE CRYS ER 20 MEQ PO TBCR: 20.0000 meq | EXTENDED_RELEASE_TABLET | Freq: Once | ORAL | Status: AC

## 2015-02-01 MED ORDER — SORBITOL 70 % PO SOLN
30.0000 mL | Freq: Once | ORAL | Status: AC
  Administered 2015-02-01: 30 mL via ORAL
  Filled 2015-02-01: qty 30

## 2015-02-01 MED ORDER — GLIMEPIRIDE 4 MG PO TABS
4.0000 mg | ORAL_TABLET | Freq: Every day | ORAL | Status: DC
  Administered 2015-02-02 – 2015-02-03 (×2): 4 mg via ORAL
  Filled 2015-02-01 (×3): qty 1

## 2015-02-01 MED ORDER — ASPIRIN EC 81 MG PO TBEC
81.0000 mg | DELAYED_RELEASE_TABLET | Freq: Every day | ORAL | Status: DC
  Administered 2015-02-01 – 2015-02-03 (×3): 81 mg via ORAL
  Filled 2015-02-01 (×3): qty 1

## 2015-02-01 NOTE — Discharge Summary (Signed)
Physician Discharge Summary       [address]       [place] [address]             [phone]    Patient ID: Juan [name] MRN: [id number] DOB/AGE: 02-Jan-1956 60 y.o.  Admit date: 01/29/2015 Discharge date: 02/03/2015  Admission Diagnoses: Coronary artery disease  Active Diagnoses:  1. Essential hypertension 2. Hyperlipidemia 3. DM Type II ([place]) 4. Mild OSA 5. Hypothyroidism 6. Mild thrombocytopenia 7. ABL anemia 8. Atrial tachycardia   Procedure (s):  1. Coronary artery bypass grafting x4 (left internal mammary artery to LAD, saphenous vein graft to posterior descending, saphenous vein graft to OM1, saphenous vein graft to OM2). 2. Endoscopic harvest of bilateral leg greater saphenous vein by Dr. Prescott Gum on 01/29/2015.  History of Presenting Illness: This is a 60 year old Caucasian male diabetic nonsmoker with strong family history for CAD.He was admitted through the emergency department at [facility] last week for unstable angina and ruled out for MI. He was initially seen in the office by Dr. Prescott Gum. He presented after recent cardiac catheterization that demonstrated severe three-vessel coronary disease and in-stent stenosis. The patient has had coronary stents placed since 1994 in [place]. He has stents to the right coronary, the circumflex marginal, and the LAD. He has been on Plavix chronically.   LV function appears to be fairly well preserved and there is no significant valvular disease. He has very tight 90% stenosis in the right coronary, circumflex, and 80% to 90% in the proximal LAD. He is having chest pain with minimal activity.  Cardiac catheterization was performed. His beta Blocker dose was increased into his discharge to home to be evaluated for CABG. He denies any symptoms of CHF.  The patient denies any problems related to his diabetes - no significant renal problems, no significant vision problems, no significant neuropathy  of his lower extremities. He has no history DVT or venous insufficiency of his legs. He is had 1 dose of Plavix in the last 8 or 9 days.  Cardiac catheterization was performed via the right radial artery without complication. The patient is right-hand dominant. Dr. Prescott Gum discussed the need for coronary artery bypass grafting surgery. Potential risks, complications, and benefits of the surgery were discussed with the patient and he agreed to proceed with surgery. Pre operative carotid duplex US showed no significant carotid artery stenosis bilaterally. He underwent a CABG x 4 on 01/29/2015.   Brief Hospital Course:  The patient was extubated the evening of surgery without difficulty. He/she remained afebrile and hemodynamically stable. Gordy Councilman, a line, and foley were removed early in the post operative course. Last chest tube was removed on 01/02.  Lopressor was started and titrated accordingly. He did go into atrial tachycardia. He was put on Amiodarone. He remained in sinus rhythm. He was volume over loaded and diuresed. He had ABL anemia. He did not require a post op transfusion. His last H and H was stable and 11.7 and 34.5. He did have mild thrombocytopenia. His last platelet count was up to 116,000. He was weaned off the insulin drip. The patient's glucose remained well controlled. The patient's HGA1C pre op was 11. Once he was tolerating a diet, home diabetic medicines (Metformin XR and Amaryl) were restarted. He is also to be started on lantus at home and he has been educated by the diabetes coordinator.  The patient was felt surgically stable for transfer from the ICU to PCTU  for further convalescence on 01/31/2015. He continues to progress with cardiac rehab. He was ambulating on room air. He has been tolerating a diet and has had a bowel movement. Epicardial pacing wires and chest tube sutures will be removed prior to discharge. The patient is felt surgically stable for discharge  today.   Latest Vital Signs: Blood pressure 108/63, pulse 80, temperature 98.8 F (37.1 C), temperature source Oral, resp. rate 18, height 5\' 8"  (1.727 m), weight 198 lb 11.2 oz (90.13 kg), SpO2 96 %.  Physical Exam: Cardiovascular: RRR Pulmonary: Slightly diminished at bases; no rales, wheezes, or rhonchi. Abdomen: Soft, non tender, bowel sounds present. Extremities: Mild bilateral lower extremity edema. Wounds: Aquacel dressing is intact. LE wounds are clean and dry.  Discharge Condition:Stable and discharged to home  Recent laboratory studies:  Lab Results  Component Value Date   WBC 7.7 02/02/2015   HGB 11.0* 02/02/2015   HCT 32.9* 02/02/2015   MCV 92.4 02/02/2015   PLT 150 02/02/2015   Lab Results  Component Value Date   NA 138 02/02/2015   K 3.5 02/02/2015   CL 101 02/02/2015   CO2 27 02/02/2015   CREATININE 0.78 02/02/2015   GLUCOSE 179* 02/02/2015    Diagnostic Studies:Dg Chest 2 View  02/01/2015  CLINICAL DATA:  Postop shortness of breath. EXAM: CHEST  2 VIEW COMPARISON:  01/31/2015. FINDINGS: Right IJ sheath is been removed in the interval. Left chest tube remains in place without evidence for left-sided pneumothorax. Tiny left pleural effusion suspected. Subsegmental atelectasis is evident bilaterally. Cardiopericardial silhouette remains enlarged. The right para midline mediastinal/pericardial drain has been removed. Telemetry leads overlie the chest. Bones are diffusely demineralized. IMPRESSION: Interval removal of right IJ sheath and midline drain. Subsegmental atelectasis bilaterally. Electronically Signed   By: Misty Stanley M.D.   On: 02/01/2015 09:22      Discharge Instructions    Amb Referral to Cardiac Rehabilitation    Complete by:  As directed   Diagnosis:  CABG           Discharge Medications:   Medication List    STOP taking these medications        amLODipine 5 MG tablet  Commonly known as:  NORVASC     nitroGLYCERIN 0.4 MG SL tablet   Commonly known as:  NITROSTAT     ramipril 5 MG capsule  Commonly known as:  ALTACE      TAKE these medications        amiodarone 200 MG tablet  Commonly known as:  PACERONE  Take 2 tablets (400 mg total) by mouth 2 (two) times daily. For 1 week, then take 400 mg  once daily     aspirin 81 MG tablet  Take 81 mg by mouth daily.     clopidogrel 75 MG tablet  Commonly known as:  PLAVIX  Take 1 tablet (75 mg total) by mouth daily.     FISH OIL PO  Take 1,800 mg by mouth daily.     folic acid A999333 MCG tablet  Commonly known as:  FOLVITE  Take 400 mcg by mouth daily.     furosemide 40 MG tablet  Commonly known as:  LASIX  Take 1 tablet (40 mg total) by mouth daily.     glimepiride 4 MG tablet  Commonly known as:  AMARYL  Take 1 tablet (4 mg total) by mouth daily with breakfast.     Insulin Glargine 100 UNIT/ML Solostar Pen  Commonly known as:  LANTUS  Inject 24 Units into the skin daily at 10 pm.     levothyroxine 100 MCG tablet  Commonly known as:  SYNTHROID  Take 1 tablet (100 mcg total) by mouth daily before breakfast.     metFORMIN 500 MG 24 hr tablet  Commonly known as:  GLUCOPHAGE-XR  Take 1 tablet (500 mg total) by mouth 2 (two) times daily with a meal.     metoprolol 50 MG tablet  Commonly known as:  LOPRESSOR  Take 1 tablet (50 mg total) by mouth 2 (two) times daily.     multivitamin tablet  Take 1 tablet by mouth 2 (two) times daily.     oxyCODONE 5 MG immediate release tablet  Commonly known as:  Oxy IR/ROXICODONE  Take 1-2 tablets (5-10 mg total) by mouth every 4 (four) hours as needed for severe pain.     potassium chloride SA 20 MEQ tablet  Commonly known as:  K-DUR,KLOR-CON  Take 2 tablets (40 mEq total) by mouth daily.     simvastatin 40 MG tablet  Commonly known as:  ZOCOR  TAKE 1 BY MOUTH DAILY       The patient has been discharged on:   1.Beta Blocker:  Yes [ x  ]                              No   [   ]                               If No, reason:  2.Ace Inhibitor/ARB: Yes [   ]                                     No  [  x  ]                                     If No, reason:BP too low  3.Statin:   Yes [ x  ]                  No  [   ]                  If No, reason:  4.Ecasa:  Yes  [ x  ]                  No   [   ]                  If No, reason:  Follow Up Appointments: Follow-up Information    Follow up with Ida Rogue, MD Today.   Specialty:  Cardiology   Why:  Call for a follow up appointment for 2 weeks   Contact information:   [place] [address] [phone]       Follow up with Len Childs, MD.   Specialty:  Cardiothoracic Surgery   Why:  PA/LAT CXR to be taken (at [facility] which is in the same building as Dr. Lucianne Lei Trigt's office) on [id number] is at    Contact information:   [place] [address] [phone]  Follow up with Vena Austria, MD.   Specialty:  Family Medicine   Why:  Call for a follow up regarding further treatment of diabetes and surveillance of HGA1C 11   Contact information:   [place] [address] [phone]       Signed: Jadene Pierini EPA-C 02/03/2015, 8:10 AM

## 2015-02-01 NOTE — Discharge Instructions (Signed)
° ° °Activity: 1.May walk up steps °               2.No lifting more than ten pounds for four weeks.  °               3.No driving for four weeks. °               4.Stop any activity that causes chest pain, shortness of breath, dizziness, sweating or excessive weakness. °               5.Avoid straining. °               6.Continue with your breathing exercises daily. ° °Diet: Diabetic diet and Low fat, Low salt diet ° °Wound Care: May shower.  Clean wounds with mild soap and water daily. Contact the office at [phone] if any problems arise. ° °Coronary Artery Bypass Grafting, Care After °Refer to this sheet in the next few weeks. These instructions provide you with information on caring for yourself after your procedure. Your health care provider may also give you more specific instructions. Your treatment has been planned according to current medical practices, but problems sometimes occur. Call your health care provider if you have any problems or questions after your procedure. °WHAT TO EXPECT AFTER THE PROCEDURE °Recovery from surgery will be different for everyone. Some people feel well after 3 or 4 weeks, while for others it takes longer. After your procedure, it is typical to have the following: °· Nausea and a lack of appetite.   °· Constipation. °· Weakness and fatigue.   °· Depression or irritability.   °· Pain or discomfort at your incision site. °HOME CARE INSTRUCTIONS °· Take medicines only as directed by your health care provider. Do not stop taking medicines or start any new medicines without first checking with your health care provider. °· Take your pulse as directed by your health care provider. °· Perform deep breathing as directed by your health care provider. If you were given a device called an incentive spirometer, use it to practice deep breathing several times a day. Support your chest with a pillow or your arms when you take deep breaths or cough. °· Keep incision areas clean, dry, and  protected. Remove or change any bandages (dressings) only as directed by your health care provider. You may have skin adhesive strips over the incision areas. Do not take the strips off. They will fall off on their own. °· Check incision areas daily for any swelling, redness, or drainage. °· If incisions were made in your legs, do the following: °¨ Avoid crossing your legs.   °¨ Avoid sitting for long periods of time. Change positions every 30 minutes.   °¨ Elevate your legs when you are sitting. °· Wear compression stockings as directed by your health care provider. These stockings help keep blood clots from forming in your legs. °· Take showers once your health care provider approves. Until then, only take sponge baths. Pat incisions dry. Do not rub incisions with a washcloth or towel. Do not take baths, swim, or use a hot tub until your health care provider approves. °· Eat foods that are high in fiber, such as raw fruits and vegetables, whole grains, beans, and nuts. Meats should be lean cut. Avoid canned, processed, and fried foods. °· Drink enough fluid to keep your urine clear or pale yellow. °· Weigh yourself every day. This helps identify if you are retaining fluid that may make your heart   and lungs work harder. °· Rest and limit activity as directed by your health care provider. You may be instructed to: °¨ Stop any activity at once if you have chest pain, shortness of breath, irregular heartbeats, or dizziness. Get help right away if you have any of these symptoms. °¨ Move around frequently for short periods or take short walks as directed by your health care provider. Increase your activities gradually. You may need physical therapy or cardiac rehabilitation to help strengthen your muscles and build your endurance. °¨ Avoid lifting, pushing, or pulling anything heavier than 10 lb (4.5 kg) for at least 6 weeks after surgery. °· Do not drive until your health care provider approves.  °· Ask your health  care provider when you may return to work. °· Ask your health care provider when you may resume sexual activity. °· Keep all follow-up visits as directed by your health care provider. This is important. °SEEK MEDICAL CARE IF: °· You have swelling, redness, increasing pain, or drainage at the site of an incision. °· You have a fever. °· You have swelling in your ankles or legs. °· You have pain in your legs.   °· You gain 2 or more pounds (0.9 kg) a day. °· You are nauseous or vomit. °· You have diarrhea.  °SEEK IMMEDIATE MEDICAL CARE IF: °· You have chest pain that goes to your jaw or arms. °· You have shortness of breath.   °· You have a fast or irregular heartbeat.   °· You notice a "clicking" in your breastbone (sternum) when you move.   °· You have numbness or weakness in your arms or legs. °· You feel dizzy or light-headed.   °MAKE SURE YOU: °· Understand these instructions. °· Will watch your condition. °· Will get help right away if you are not doing well or get worse. °  °This information is not intended to replace advice given to you by your health care provider. Make sure you discuss any questions you have with your health care provider. °  °Document Released: 08/05/2004 Document Revised: 02/06/2014 Document Reviewed: 06/25/2012 °Elsevier Interactive Patient Education ©2016 [employer] Inc[employer] ° °

## 2015-02-01 NOTE — Care Management Note (Signed)
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                 Action/Plan:  PTA pt lived at home- anticipate return home - CM to follow for d/c needs  Expected Discharge Date:                  Expected Discharge Plan:  Home/Self Care  In-House Referral:     Discharge planning Services  CM Consult  Post Acute Care Choice:    Choice offered to:     DME Arranged:    DME Agency:     HH Arranged:    HH Agency:     Status of Service:  In process, will continue to follow  Medicare Important Message Given:    Date Medicare IM Given:    Medicare IM give by:    Date Additional Medicare IM Given:    Additional Medicare Important Message give by:     If discussed at [place] of Stay Meetings, dates discussed:    Additional Comments:  02/01/15- plan to remove CT today, EPW remain intact, CM to follow for pt progression.   Dawayne Patricia, RN 02/01/2015, 10:53 AM

## 2015-02-01 NOTE — Progress Notes (Addendum)
[address]       [place] [address]             [phone]        3 Days Post-Op Procedure(s) (LRB): CORONARY ARTERY BYPASS GRAFTING (CABG)x four using left internal mammary artery artery and bilateral saphenous thigh vein using endoscope. (N/A) TRANSESOPHAGEAL ECHOCARDIOGRAM (TEE) (N/A)  Subjective: Patient had bowel movement. Only complaint is food does not have much taste.  Objective: Vital signs in last 24 hours: Temp:  [98.5 F (36.9 C)-99.9 F (37.7 C)] 98.9 F (37.2 C) (01/02 0430) Pulse Rate:  [92-127] 103 (01/02 0430) Cardiac Rhythm:  [-] Normal sinus rhythm (01/02 0700) Resp:  [15-38] 18 (01/02 0430) BP: (98-133)/(62-76) 113/67 mmHg (01/02 0430) SpO2:  [95 %-98 %] 95 % (01/02 0430) Weight:  [203 lb 8 oz (92.307 kg)] 203 lb 8 oz (92.307 kg) (01/02 0430)  Pre op weight 90 kg Current Weight  02/01/15 203 lb 8 oz (92.307 kg)      Intake/Output from previous day: 01/01 0701 - 01/02 0700 In: 456.8 [P.O.:250; I.V.:106.8; IV Piggyback:100] Out: 9147 [Urine:1310; Chest Tube:160]   Physical Exam:  Cardiovascular: RR. Pulmonary: Slightly diminished at bases; no rales, wheezes, or rhonchi. Abdomen: Soft, non tender, bowel sounds present. Extremities: Mild bilateral lower extremity edema. Wounds: Aquacel dressing is intact. LE wounds are clean and dry.  Lab Results: CBC: Recent Labs  01/31/15 0400 02/01/15 0239  WBC 9.0 9.3  HGB 11.4* 11.7*  HCT 33.3* 34.5*  PLT 83* 116*   BMET:  Recent Labs  01/31/15 0400 02/01/15 0239  NA 137 137  K 3.7 3.9  CL 103 102  CO2 26 25  GLUCOSE 202* 177*  BUN 8 15  CREATININE 0.82 0.66  CALCIUM 8.7* 8.7*    PT/INR:  Lab Results  Component Value Date   INR 1.64* 01/29/2015   INR 1.19 01/27/2015   INR 1.12 01/21/2015   ABG:  INR: Will add last result for INR, ABG once components are confirmed Will add last 4 CBG results once components are confirmed  Assessment/Plan:  1. CV - ST  in the low 100's. On Amiodarone 400 mg bid, Plavix 75 mg daily, Lopressor 25 mg bid. Will restart Ramipril in am if BP allows. Will decrease ecasa to 81 mg daily. 2.  Pulmonary - On room air. Chest tube with 160 cc output last 24 hours. No air leak. CXR appears to show no pneumothorax, cardiomegaly, bibasilar atelectasis .  Remove chest tube. Encourage incentive spirometer. 3. Volume Overload - On Lasix 40 mg daily 4.  Acute blood loss anemia - H and H stable at 11.7 and 34.5 5. Mild thrombocytopenia-platelets up to 116,000 6. Supplement potassium 7.DM-CBGs  186/190/182 . Pre op HGA1C 11. On Metformin XR 500 mg bid and Amaryl 4 mg daily. Will restart both since creatinine within normal and tolerating diet. He will need close medical follow up after discharge 8. Remove EPW in am  ZIMMERMAN,DONIELLE MPA-C 02/01/2015,7:55 AM  DC remaining pleural tube Plan for discharge in  1-2 days He will need to be prepared for at bedtime Lantus dosing with syringe and test kit at home-preop A1c greater than 11  patient examined and medical record reviewed,agree with above note. Tharon Aquas Trigt III 02/01/2015

## 2015-02-01 NOTE — Progress Notes (Signed)
Chest tube removed successfully. Pt has no complaints of pain or sob. Pt is stable. Will continue to monitor

## 2015-02-01 NOTE — Progress Notes (Signed)
Utilization review completed.  

## 2015-02-02 ENCOUNTER — Encounter (HOSPITAL_COMMUNITY): Payer: Self-pay | Admitting: Cardiothoracic Surgery

## 2015-02-02 ENCOUNTER — Inpatient Hospital Stay (HOSPITAL_COMMUNITY): Payer: BLUE CROSS/BLUE SHIELD

## 2015-02-02 LAB — BASIC METABOLIC PANEL
Anion gap: 10 (ref 5–15)
BUN: 17 mg/dL (ref 6–20)
CO2: 27 mmol/L (ref 22–32)
Calcium: 8.4 mg/dL — ABNORMAL LOW (ref 8.9–10.3)
Chloride: 101 mmol/L (ref 101–111)
Creatinine, Ser: 0.78 mg/dL (ref 0.61–1.24)
GFR calc Af Amer: >60 mL/min (ref >60)
GFR calc non Af Amer: >60 mL/min (ref >60)
Glucose, Bld: 179 mg/dL — ABNORMAL HIGH (ref 65–99)
Potassium: 3.5 mmol/L (ref 3.5–5.1)
Sodium: 138 mmol/L (ref 135–145)

## 2015-02-02 LAB — GLUCOSE, CAPILLARY
Glucose-Capillary: 158 mg/dL — ABNORMAL HIGH (ref 65–99)
Glucose-Capillary: 191 mg/dL — ABNORMAL HIGH (ref 65–99)
Glucose-Capillary: 119 mg/dL — ABNORMAL HIGH (ref 65–99)
Glucose-Capillary: 115 mg/dL — ABNORMAL HIGH (ref 65–99)

## 2015-02-02 LAB — CBC
HCT: 32.9 % — ABNORMAL LOW (ref 39.0–52.0)
Hemoglobin: 11 g/dL — ABNORMAL LOW (ref 13.0–17.0)
MCH: 30.9 pg (ref 26.0–34.0)
MCHC: 33.4 g/dL (ref 30.0–36.0)
MCV: 92.4 fL (ref 78.0–100.0)
Platelets: 150 10*3/uL (ref 150–400)
RBC: 3.56 MIL/uL — ABNORMAL LOW (ref 4.22–5.81)
RDW: 14.1 % (ref 11.5–15.5)
WBC: 7.7 10*3/uL (ref 4.0–10.5)

## 2015-02-02 MED ORDER — INSULIN GLARGINE 100 UNIT/ML ~~LOC~~ SOLN
24.0000 [IU] | Freq: Every day | SUBCUTANEOUS | Status: DC
  Administered 2015-02-02: 24 [IU] via SUBCUTANEOUS
  Filled 2015-02-02 (×2): qty 0.24

## 2015-02-02 MED ORDER — POTASSIUM CHLORIDE CRYS ER 20 MEQ PO TBCR
40.0000 meq | EXTENDED_RELEASE_TABLET | Freq: Every day | ORAL | Status: DC
  Administered 2015-02-02 – 2015-02-03 (×2): 40 meq via ORAL
  Filled 2015-02-02 (×2): qty 2

## 2015-02-02 MED ORDER — METOPROLOL TARTRATE 25 MG PO TABS
37.5000 mg | ORAL_TABLET | Freq: Two times a day (BID) | ORAL | Status: DC
  Administered 2015-02-02 – 2015-02-03 (×3): 37.5 mg via ORAL
  Filled 2015-02-02 (×3): qty 1

## 2015-02-02 MED FILL — Potassium Chloride Inj 2 mEq/ML: INTRAVENOUS | Qty: 40 | Status: AC

## 2015-02-02 MED FILL — Magnesium Sulfate Inj 50%: INTRAMUSCULAR | Qty: 2 | Status: AC

## 2015-02-02 MED FILL — Heparin Sodium (Porcine) Inj 1000 Unit/ML: INTRAMUSCULAR | Qty: 30 | Status: AC

## 2015-02-02 NOTE — Progress Notes (Signed)
CARDIAC REHAB PHASE I   PRE:  Rate/Rhythm: 92 SR    BP: sitting 141/81    SaO2: 96 RA  MODE:  Ambulation: 1040 ft   POST:  Rate/Rhythm: 112 ST    BP: sitting 149/75     SaO2: 97 RA  Pt able to stand with min assist and walk long distance without fatigue or RW. No c/o. HR to 112 ST. Return to recliner. Ed completed with pt and wife. Voiced understanding. Good reception. Will send referral to [facility]. He is in the process of getting a new PCP, has appointment in Feb. Will walk independently today. [id number]   Juan [name] CES, ACSM 02/02/2015 10:24 AM

## 2015-02-02 NOTE — Progress Notes (Addendum)
Inpatient Diabetes Program Recommendations  AACE/ADA: New Consensus Statement on Inpatient Glycemic Control (2015)  Target Ranges:  Prepandial:   less than 140 mg/dL      Peak postprandial:   less than 180 mg/dL (1-2 hours)      Critically ill patients:  140 - 180 mg/dL   Results for Juan [name], Juan [name] (MRN [id number]) as of 02/02/2015 09:24  Ref. Range 02/01/2015 06:48 02/01/2015 11:36 02/01/2015 16:32 02/01/2015 21:22 02/02/2015 06:22  Glucose-Capillary Latest Ref Range: 65-99 mg/dL 182 (H) 185 (H) 176 (H) 252 (H) 158 (H)   Review of Glycemic Control  Diabetes history: DM 2 Outpatient Diabetes medications: Amaryl 4 Daily, Metformin 500 BID Current orders for Inpatient glycemic control: Amaryl 4 Daily, Metformin 500 BID, Novolog 0-24 units Q4hrs  Inpatient Diabetes Program Recommendations: Insulin - Basal: Due to elevated A1c, in preparation for discharge, please consider restarting Levemir back 18-20 units Q24hrs.   Rounding Note 1100 am:  Educated patient and spouse on insulin pen use at home. Reviewed contents of insulin flexpen starter kit. Reviewed all steps if insulin pen including attachment of needle, 2-unit air shot, dialing up dose, giving injection, removing needle, disposal of sharps, storage of unused insulin, disposal of insulin etc. Patient able to provide successful return demonstration. Also reviewed troubleshooting with insulin pen. MD to give patient Rxs for insulin pens (flexpen of either Levemir or Lantus), insulin pen needles (order [phone]), and oral DM medications. Gave patient information for local Endocrinologists. Patient transitioning to new PCP office first appointment in February will not give prescriptions at this point.  Thanks,  Tama Headings RN, MSN, [facility] Inpatient Diabetes Coordinator Team Pager [phone] (8a-5p)

## 2015-02-02 NOTE — Progress Notes (Addendum)
[address]       [place] [address]             [phone]      4 Days Post-Op Procedure(s) (LRB): CORONARY ARTERY BYPASS GRAFTING (CABG)x four using left internal mammary artery artery and bilateral saphenous thigh vein using endoscope. (N/A) TRANSESOPHAGEAL ECHOCARDIOGRAM (TEE) (N/A) Subjective: Feels pretty well, tachy at times to 120's  Objective: Vital signs in last 24 hours: Temp:  [98.7 F (37.1 C)-99.8 F (37.7 C)] 98.7 F (37.1 C) (01/03 0613) Pulse Rate:  [89-103] 89 (01/03 0613) Cardiac Rhythm:  [-] Sinus tachycardia (01/02 1900) Resp:  [18] 18 (01/03 0613) BP: (116-122)/(54-68) 116/66 mmHg (01/03 0613) SpO2:  [95 %-97 %] 97 % (01/03 0613) Weight:  [199 lb 4.8 oz (90.402 kg)] 199 lb 4.8 oz (90.402 kg) (01/03 [id number])  Hemodynamic parameters for last 24 hours:    Intake/Output from previous day: 01/02 0701 - 01/03 0700 In: 480 [P.O.:480] Out: 500 [Urine:500] Intake/Output this shift:    General appearance: alert, cooperative and no distress Heart: regular rate and rhythm Lungs: clear to auscultation bilaterally Abdomen: benign Extremities: + LE edema Wound: incis healing well  Lab Results:  Recent Labs  02/01/15 0239 02/02/15 0255  WBC 9.3 7.7  HGB 11.7* 11.0*  HCT 34.5* 32.9*  PLT 116* 150   BMET:  Recent Labs  02/01/15 0239 02/02/15 0255  NA 137 138  K 3.9 3.5  CL 102 101  CO2 25 27  GLUCOSE 177* 179*  BUN 15 17  CREATININE 0.66 0.78  CALCIUM 8.7* 8.4*    PT/INR: No results for input(s): LABPROT, INR in the last 72 hours. ABG    Component Value Date/Time   PHART 7.355 01/29/2015 1759   HCO3 23.5 01/29/2015 1759   TCO2 24 01/30/2015 1603   ACIDBASEDEF 2.0 01/29/2015 1759   O2SAT 98.0 01/29/2015 1759   CBG (last 3)   Recent Labs  02/01/15 1632 02/01/15 2122 02/02/15 0622  GLUCAP 176* 252* 158*    Meds Scheduled Meds: . acetaminophen  1,000 mg Oral 4 times per day  . amiodarone  400 mg Oral BID   . aspirin EC  81 mg Oral Daily  . atorvastatin  20 mg Oral q1800  . bisacodyl  10 mg Oral Daily   Or  . bisacodyl  10 mg Rectal Daily  . clopidogrel  75 mg Oral Daily  . docusate sodium  200 mg Oral Daily  . folic acid  1 mg Oral Daily  . furosemide  40 mg Oral Daily  . glimepiride  4 mg Oral Q breakfast  . insulin aspart  0-24 Units Subcutaneous TID AC & HS  . levothyroxine  100 mcg Oral QAC breakfast  . metFORMIN  500 mg Oral BID WC  . metoCLOPramide  10 mg Oral TID AC  . metoprolol tartrate  25 mg Oral BID  . multivitamin with minerals  1 tablet Oral Daily  . mupirocin cream   Topical BID  . pantoprazole  40 mg Oral Daily  . potassium chloride  20 mEq Oral Daily  . sodium chloride  3 mL Intravenous Q12H  . sodium chloride  3 mL Intravenous Q12H   Continuous Infusions: . lactated ringers 20 mL/hr at 01/31/15 0600  . lactated ringers 20 mL/hr at 01/29/15 1900   PRN Meds:.sodium chloride, alum & mag hydroxide-simeth, magnesium hydroxide, metoprolol, ondansetron (ZOFRAN) IV, oxyCODONE, sodium chloride, sodium chloride, traMADol  Xrays Dg Chest  2 View  02/01/2015  CLINICAL DATA:  Postop shortness of breath. EXAM: CHEST  2 VIEW COMPARISON:  01/31/2015. FINDINGS: Right IJ sheath is been removed in the interval. Left chest tube remains in place without evidence for left-sided pneumothorax. Tiny left pleural effusion suspected. Subsegmental atelectasis is evident bilaterally. Cardiopericardial silhouette remains enlarged. The right para midline mediastinal/pericardial drain has been removed. Telemetry leads overlie the chest. Bones are diffusely demineralized. IMPRESSION: Interval removal of right IJ sheath and midline drain. Subsegmental atelectasis bilaterally. Electronically Signed   By: Misty Stanley M.D.   On: 02/01/2015 09:22    Assessment/Plan: S/P Procedure(s) (LRB): CORONARY ARTERY BYPASS GRAFTING (CABG)x four using left internal mammary artery artery and bilateral saphenous  thigh vein using endoscope. (N/A) TRANSESOPHAGEAL ECHOCARDIOGRAM (TEE) (N/A)  1 will ask diabetes educator to do teaching as he will be giving self insulin 2 increase metoprolol a bit 3 cont diuretics for volume overload 4 replace K+ 5 H/H pretty stable 6 rehab is going well 7 d/c wires today  LOS: 4 days    GOLD,WAYNE E 02/02/2015  He will,need lantus HS dosing as well as metformin and amaryl- needs needles and syringes patient examined and medical record reviewed,agree with above note. Tharon Aquas Trigt III 02/02/2015

## 2015-02-02 NOTE — Progress Notes (Signed)
Removed epicardial wires per order. 3 Intact.  No ectopy. Pt tolerated procedure well.  Pt instructed to remain on bedrest for one hour. Frequent vitals will be taken and documented.  Pt resting with call bell within reach. Hortense Ramal Rosana Hoes, RN

## 2015-02-03 ENCOUNTER — Telehealth: Payer: Self-pay

## 2015-02-03 LAB — GLUCOSE, CAPILLARY
Glucose-Capillary: 134 mg/dL — ABNORMAL HIGH (ref 65–99)
Glucose-Capillary: 167 mg/dL — ABNORMAL HIGH (ref 65–99)

## 2015-02-03 MED ORDER — INSULIN GLARGINE 100 UNIT/ML SOLOSTAR PEN: 24.0000 [IU] | PEN_INJECTOR | Freq: Every day | SUBCUTANEOUS | Status: DC

## 2015-02-03 MED ORDER — METFORMIN HCL ER 500 MG PO TB24: 500.0000 mg | ORAL_TABLET | Freq: Two times a day (BID) | ORAL | Status: DC

## 2015-02-03 MED ORDER — AMIODARONE HCL 200 MG PO TABS: 400.0000 mg | ORAL_TABLET | Freq: Two times a day (BID) | ORAL | Status: DC

## 2015-02-03 MED ORDER — OXYCODONE HCL 5 MG PO TABS: 5.0000 mg | ORAL_TABLET | ORAL | Status: DC | PRN

## 2015-02-03 MED ORDER — FUROSEMIDE 40 MG PO TABS: 40.0000 mg | ORAL_TABLET | Freq: Every day | ORAL | Status: DC

## 2015-02-03 MED ORDER — POTASSIUM CHLORIDE CRYS ER 20 MEQ PO TBCR: 40.0000 meq | EXTENDED_RELEASE_TABLET | Freq: Every day | ORAL | Status: DC

## 2015-02-03 MED ORDER — GLIMEPIRIDE 4 MG PO TABS: 4.0000 mg | ORAL_TABLET | Freq: Every day | ORAL | Status: DC

## 2015-02-03 MED ORDER — CLOPIDOGREL BISULFATE 75 MG PO TABS: 75.0000 mg | ORAL_TABLET | Freq: Every day | ORAL | Status: DC

## 2015-02-03 NOTE — Telephone Encounter (Signed)
-----   Message from Candis Schatz sent at 02/02/2015  9:25 AM EST ----- Regarding: FW: Follow Up Appointment   ----- Message -----    From: Nani Skillern, PA-C    Sent: 02/01/2015   9:31 AM      To: Candis Schatz Subject: Follow Up Appointment                          Hi Trish. MRN # [id number] is s/p CABG x 4 on 12/30 by Dr. Prescott Gum. He is a patient of Dr. Donivan Scull. He needs a follow up appointment. Thank you.

## 2015-02-03 NOTE — Care Management Note (Signed)
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                 Action/Plan:  PTA pt lived at home- anticipate return home - CM to follow for d/c needs  Expected Discharge Date:    02/03/15              Expected Discharge Plan:  Home/Self Care  In-House Referral:     Discharge planning Services  CM Consult  Post Acute Care Choice:    Choice offered to:     DME Arranged:    DME Agency:     HH Arranged:    [place] Agency:     Status of Service:  Completed, signed off  Medicare Important Message Given:    Date Medicare IM Given:    Medicare IM give by:    Date Additional Medicare IM Given:    Additional Medicare Important Message give by:     If discussed at [place] of Stay Meetings, dates discussed:  02/02/15  Discharge Disposition: Home/self care   Additional Comments:  02/03/15- plan for d/c today - home with spouse-   02/01/15- plan to remove CT today, EPW remain intact, CM to follow for pt progression.   Dawayne Patricia, RN 02/03/2015, 9:46 AM

## 2015-02-03 NOTE — Progress Notes (Signed)
Discharge instructions given and both patient and wife verbalized understanding

## 2015-02-03 NOTE — Telephone Encounter (Signed)
L mom to see if pt can come on 1/16 for Dr. Rockey Situ.

## 2015-02-03 NOTE — Progress Notes (Signed)
[address]       [place] [address]             [phone]      5 Days Post-Op Procedure(s) (LRB): CORONARY ARTERY BYPASS GRAFTING (CABG)x four using left internal mammary artery artery and bilateral saphenous thigh vein using endoscope. (N/A) TRANSESOPHAGEAL ECHOCARDIOGRAM (TEE) (N/A) Subjective: Feels well, no new issues  Objective: Vital signs in last 24 hours: Temp:  [98.8 F (37.1 C)-98.9 F (37.2 C)] 98.8 F (37.1 C) (01/04 0555) Pulse Rate:  [80-85] 80 (01/04 0555) Cardiac Rhythm:  [-] Normal sinus rhythm (01/03 1919) Resp:  [18-19] 18 (01/04 0555) BP: (104-117)/(63-71) 108/63 mmHg (01/04 0555) SpO2:  [96 %-97 %] 96 % (01/04 0555) Weight:  [198 lb 11.2 oz (90.13 kg)] 198 lb 11.2 oz (90.13 kg) (01/04 0555)  Hemodynamic parameters for last 24 hours:    Intake/Output from previous day: 01/03 0701 - 01/04 0700 In: [id number] [P.O.:840; I.V.:3] Out: 475 [Urine:475] Intake/Output this shift:    General appearance: alert, cooperative and no distress Heart: regular rate and rhythm Lungs: clear to auscultation bilaterally Abdomen: benign Extremities: trace edema Wound: incis healing well  Lab Results:  Recent Labs  02/01/15 0239 02/02/15 0255  WBC 9.3 7.7  HGB 11.7* 11.0*  HCT 34.5* 32.9*  PLT 116* 150   BMET:  Recent Labs  02/01/15 0239 02/02/15 0255  NA 137 138  K 3.9 3.5  CL 102 101  CO2 25 27  GLUCOSE 177* 179*  BUN 15 17  CREATININE 0.66 0.78  CALCIUM 8.7* 8.4*    PT/INR: No results for input(s): LABPROT, INR in the last 72 hours. ABG    Component Value Date/Time   PHART 7.355 01/29/2015 1759   HCO3 23.5 01/29/2015 1759   TCO2 24 01/30/2015 1603   ACIDBASEDEF 2.0 01/29/2015 1759   O2SAT 98.0 01/29/2015 1759   CBG (last 3)   Recent Labs  02/02/15 1645 02/02/15 2107 02/03/15 0612  GLUCAP 119* 115* 134*    Meds Scheduled Meds: . acetaminophen  1,000 mg Oral 4 times per day  . amiodarone  400 mg Oral BID  .  aspirin EC  81 mg Oral Daily  . atorvastatin  20 mg Oral q1800  . bisacodyl  10 mg Oral Daily   Or  . bisacodyl  10 mg Rectal Daily  . clopidogrel  75 mg Oral Daily  . docusate sodium  200 mg Oral Daily  . folic acid  1 mg Oral Daily  . furosemide  40 mg Oral Daily  . glimepiride  4 mg Oral Q breakfast  . insulin aspart  0-24 Units Subcutaneous TID AC & HS  . insulin glargine  24 Units Subcutaneous QHS  . levothyroxine  100 mcg Oral QAC breakfast  . metFORMIN  500 mg Oral BID WC  . metoCLOPramide  10 mg Oral TID AC  . metoprolol tartrate  37.5 mg Oral BID  . multivitamin with minerals  1 tablet Oral Daily  . mupirocin cream   Topical BID  . pantoprazole  40 mg Oral Daily  . potassium chloride  40 mEq Oral Daily  . sodium chloride  3 mL Intravenous Q12H  . sodium chloride  3 mL Intravenous Q12H   Continuous Infusions: . lactated ringers 20 mL/hr at 01/31/15 0600  . lactated ringers 20 mL/hr at 01/29/15 1900   PRN Meds:.sodium chloride, alum & mag hydroxide-simeth, magnesium hydroxide, metoprolol, ondansetron (ZOFRAN) IV, oxyCODONE, sodium chloride,  sodium chloride, traMADol  Xrays Dg Chest 2 View  02/02/2015  CLINICAL DATA: CABG. EXAM: CHEST  2 VIEW COMPARISON:  02/01/2015. FINDINGS: Interim removal of left chest tube. No pneumothorax. Mediastinum hilar structures are stable. Prior CABG. Stable cardiomegaly. No pulmonary venous congestion. Persistent bilateral platelike atelectasis again noted. Tiny left pleural effusion cannot be excluded. IMPRESSION: 1. Interim removal of left chest tube.  No pneumothorax. 2. Bilateral platelike atelectasis again noted, no interim change. Small left pleural effusion cannot be excluded . 3.  Prior CABG.  Heart size stable.  No pulmonary venous congestion. Electronically Signed   By: Marcello Moores  Register   On: 02/02/2015 07:42    Assessment/Plan: S/P Procedure(s) (LRB): CORONARY ARTERY BYPASS GRAFTING (CABG)x four using left internal mammary artery  artery and bilateral saphenous thigh vein using endoscope. (N/A) TRANSESOPHAGEAL ECHOCARDIOGRAM (TEE) (N/A)  1 doing well 2 stable for discharge     LOS: 5 days    Chason Mciver E 02/03/2015

## 2015-02-04 ENCOUNTER — Other Ambulatory Visit: Payer: Self-pay | Admitting: *Deleted

## 2015-02-04 DIAGNOSIS — E039 Hypothyroidism, unspecified: Secondary | ICD-10-CM

## 2015-02-04 MED ORDER — LEVOTHYROXINE SODIUM 100 MCG PO TABS: 100.0000 ug | ORAL_TABLET | Freq: Every day | ORAL | Status: AC

## 2015-02-05 ENCOUNTER — Other Ambulatory Visit: Payer: Self-pay | Admitting: *Deleted

## 2015-02-15 ENCOUNTER — Ambulatory Visit (INDEPENDENT_AMBULATORY_CARE_PROVIDER_SITE_OTHER): Payer: BLUE CROSS/BLUE SHIELD | Admitting: Cardiovascular Disease

## 2015-02-15 ENCOUNTER — Encounter: Payer: Self-pay | Admitting: Cardiovascular Disease

## 2015-02-15 VITALS — BP 120/64 | HR 77 | Ht 68.0 in | Wt 197.2 lb

## 2015-02-15 DIAGNOSIS — I200 Unstable angina: Secondary | ICD-10-CM

## 2015-02-15 DIAGNOSIS — E785 Hyperlipidemia, unspecified: Secondary | ICD-10-CM

## 2015-02-15 DIAGNOSIS — Z794 Long term (current) use of insulin: Secondary | ICD-10-CM

## 2015-02-15 DIAGNOSIS — E118 Type 2 diabetes mellitus with unspecified complications: Secondary | ICD-10-CM

## 2015-02-15 DIAGNOSIS — Z951 Presence of aortocoronary bypass graft: Secondary | ICD-10-CM

## 2015-02-15 DIAGNOSIS — I2510 Atherosclerotic heart disease of native coronary artery without angina pectoris: Secondary | ICD-10-CM

## 2015-02-15 DIAGNOSIS — Z955 Presence of coronary angioplasty implant and graft: Secondary | ICD-10-CM | POA: Diagnosis not present

## 2015-02-15 DIAGNOSIS — E1165 Type 2 diabetes mellitus with hyperglycemia: Secondary | ICD-10-CM

## 2015-02-15 DIAGNOSIS — I10 Essential (primary) hypertension: Secondary | ICD-10-CM

## 2015-02-15 DIAGNOSIS — I480 Paroxysmal atrial fibrillation: Secondary | ICD-10-CM

## 2015-02-15 DIAGNOSIS — IMO0002 Reserved for concepts with insufficient information to code with codable children: Secondary | ICD-10-CM

## 2015-02-15 MED ORDER — FUROSEMIDE 40 MG PO TABS: 40.0000 mg | ORAL_TABLET | Freq: Every day | ORAL | Status: DC | PRN

## 2015-02-15 NOTE — Assessment & Plan Note (Signed)
Postoperative atrial fibrillation, converting to normal sinus rhythm on amiodarone  Recommended he decrease amiodarone down to 200 mg daily  Could likely wean off later this week   stay on his metoprolol

## 2015-02-15 NOTE — Assessment & Plan Note (Addendum)
Hospital records reviewed 3 weeks following his bypass surgery ,  Recovering well , vein donor sites healing, slowly improving  Small regions of ecchymosis  will start in cardiac rehabilitation in 2 weeks

## 2015-02-15 NOTE — Progress Notes (Signed)
Patient ID: Juan [name], male    DOB: 1955/04/21, 60 y.o.   MRN: [id number]  HPI Comments: Juan [name] is a very pleasant 60 year old male with a history of premature coronary artery disease, multiple cardiac catheterizations with intervention in the past dating back to 1994, total of 8 stents, History of diabetes and hyperlipidemia,  Who presented to  [facility] with angina,  Cardiac catheterization showing three-vessel disease,  Preferred to [facility] for CABG.   he presents today for follow-up after his bypass surgery 01/29/15  Coronary artery bypass grafting x4 (left internal mammary artery to LAD, saphenous vein graft to posterior descending, saphenous vein graft to OM1, saphenous vein graft to OM2)  withEndoscopic harvest of bilateral leg greater saphenous vein by Dr. Prescott Gum on 01/29/2015.   cardiac catheterization 01/21/2015 showed     Prox RCA to Mid RCA lesion, 70% stenosed. The lesion was previously treated with a stent (unknown type), Prox RCA lesion, 70% stenosed.     Mid RCA lesion, 90% stenosed.     Mid Cx lesion, 90% stenosed, Ost Cx lesion, 90% stenosed.Ost 1st Mrg to 1st Mrg lesion, 90% stenosed.     Mid LAD lesion, 80% stenosed, 1st Diag lesion, 70% stenosed.  1. Severe diffuse calcified three-vessel coronary artery disease with previous multivessel stenting. 2. Normal LV systolic function and mildly elevated left ventricular end-diastolic pressure.   postoperatively he had brief period of atrial tachycardia/atrial fibrillation, started on amiodarone,  Converted to normal sinus rhythm.   In follow-up today, he has been walking without significant shortness of breath or chest pain.  Biggest complaint is pain and swelling at the right leg  Vein donor  Site below the knee,  Some swelling in the lower shin, ankle region,  Bruising left heel  Reports that he has stopped his Lasix,  Twice to keep his leg elevated at home.  denies any PND, orthopnea, shortness of breath , cough.   reports approximately 30 pound weight loss over the past year, more recently with his surgery.  Sugars have been running low at home, sometimes 70, 80.  Scheduled to see endocrinology in follow-up   EKG on today's visit shows normal sinus rhythm with rate 77 bpm, diffuse T-wave abnormality  His records were reviewed from home showing systolic pressure [id number] up to 120s, over 70-80  Other past medical history No smoking history. Reports his father also had coronary artery disease in his 48s.      No Known Allergies  Current Outpatient Prescriptions on File Prior to Visit  Medication Sig Dispense Refill  . aspirin 81 MG tablet Take 81 mg by mouth daily.    . clopidogrel (PLAVIX) 75 MG tablet Take 1 tablet (75 mg total) by mouth daily. 30 tablet 3  . folic acid (FOLVITE) A999333 MCG tablet Take 400 mcg by mouth daily.    Marland Kitchen glimepiride (AMARYL) 4 MG tablet Take 1 tablet (4 mg total) by mouth daily with breakfast. 30 tablet 1  . Insulin Glargine (LANTUS) 100 UNIT/ML Solostar Pen Inject 24 Units into the skin daily at 10 pm. 15 mL 11  . levothyroxine (SYNTHROID) 100 MCG tablet Take 1 tablet (100 mcg total) by mouth daily before breakfast. 90 tablet 0  . metFORMIN (GLUCOPHAGE-XR) 500 MG 24 hr tablet Take 1 tablet (500 mg total) by mouth 2 (two) times daily with a meal. 60 tablet 1  . metoprolol tartrate (LOPRESSOR) 50 MG tablet Take 1 tablet (50 mg total) by mouth 2 (two) times daily.  180 tablet 3  . Multiple Vitamin (MULTIVITAMIN) tablet Take 1 tablet by mouth 2 (two) times daily.     . Omega-3 Fatty Acids (FISH OIL PO) Take 1,800 mg by mouth daily.     Marland Kitchen oxyCODONE (OXY IR/ROXICODONE) 5 MG immediate release tablet Take 1-2 tablets (5-10 mg total) by mouth every 4 (four) hours as needed for severe pain. 50 tablet 0  . potassium chloride SA (K-DUR,KLOR-CON) 20 MEQ tablet Take 2 tablets (40 mEq total) by mouth daily. 10 tablet 0  . simvastatin (ZOCOR) 40 MG tablet TAKE 1 BY MOUTH DAILY (Patient taking  differently: TAKE 1 BY MOUTH DAILY AT BEDTIME) 90 tablet 0   No current facility-administered medications on file prior to visit.    Past Medical History  Diagnosis Date  . Essential hypertension   . Hyperlipidemia   . Coronary artery disease     a. Dating back to 59 w/ h/o MI.  8 stents;  b. 10/2003 Cath [place]: LM 20d, RI 40p, LAD 30p, 70m, patent stent, D1 50p, LCX 30p, patent mid stent, 90d/diffuse, RCA dominant, 30p, patent stent, 33m/d, EF 44%-->Med Rx.  . Type II diabetes mellitus ([place])   . Mild sleep apnea   . Hypothyroidism   . Myocardial infarction ([place]) 1996    "small MI"   . Heart murmur     found during office visit  . Chronic kidney disease   . Asthma     pt. reports that he was treated for pnueumonia as a young child only    Past Surgical History  Procedure Laterality Date  . Thyroidectomy    . Cardiac catheterization  1993    [place]; Dr.Harold; [place]; Dr. Joneen Boers; [facility]  . Cardiac catheterization  1996    ""  . Cardiac catheterization      ''''  . Cardiac catheterization      '''  . Cardiac catheterization      '''  . Cardiac catheterization  2002    '''  . Cardiac catheterization  2006    [facility]; [place]   . Cyst excision      right- wrist- ganglion   . Nasal septum surgery    . Cardiac stent      x 10  . Cardiac catheterization N/A 01/21/2015    Procedure: Left Heart Cath and Coronary Angiography;  Surgeon: Wellington Hampshire, MD;  Location: [facility];  Service: Cardiovascular;  Laterality: N/A;  . Coronary artery bypass graft N/A 01/29/2015    Procedure: CORONARY ARTERY BYPASS GRAFTING (CABG)x four using left internal mammary artery artery and bilateral saphenous thigh vein using endoscope.;  Surgeon: Ivin Poot, MD;  Location: [facility];  Service: Open Heart Surgery;  Laterality: N/A;  . Tee without cardioversion N/A 01/29/2015    Procedure: TRANSESOPHAGEAL  ECHOCARDIOGRAM (TEE);  Surgeon: Ivin Poot, MD;  Location: [facility];  Service: Open Heart Surgery;  Laterality: N/A;    Social History  reports that he has never smoked. He has never used smokeless tobacco. He reports that he drinks about 0.5 oz of alcohol per week. He reports that he does not use illicit drugs.  Family History family history includes Cancer (age of onset: 31) in his father; Cirrhosis in his sister; Drug abuse in his sister; Heart attack in his father and mother.   Review of Systems  Constitutional: Negative.   Respiratory: Negative.   Cardiovascular:  Negative.   Gastrointestinal: Negative.   Musculoskeletal: Negative.   Allergic/Immunologic: Negative.   Neurological: Negative.   Hematological: Negative.   Psychiatric/Behavioral: Negative.   All other systems reviewed and are negative.  BP 120/64 mmHg  Pulse 77  Ht 5\' 8"  (1.727 m)  Wt 197 lb 4 oz (89.472 kg)  BMI 30.00 kg/m2  Physical Exam  Constitutional: He is oriented to person, place, and time. He appears well-developed and well-nourished.  HENT:  Head: Normocephalic.  Nose: Nose normal.  Mouth/Throat: Oropharynx is clear and moist.  Eyes: Conjunctivae are normal. Pupils are equal, round, and reactive to light.  Neck: Normal range of motion. Neck supple. No JVD present.  Cardiovascular: Normal rate, regular rhythm, S1 normal, S2 normal, normal heart sounds and intact distal pulses.  Exam reveals no gallop and no friction rub.   No murmur heard.  Well healed mediastinal incision,  Trace edema of the right lower extremity ankle to the mid shin , nonpitting,  Small region of ecchymosis right medial heel  Pulmonary/Chest: Effort normal and breath sounds normal. No respiratory distress. He has no wheezes. He has no rales. He exhibits no tenderness.  Abdominal: Soft. Bowel sounds are normal. He exhibits no distension. There is no tenderness.  Musculoskeletal: Normal range of motion. He exhibits no edema or  tenderness.  Lymphadenopathy:    He has no cervical adenopathy.  Neurological: He is alert and oriented to person, place, and time. Coordination normal.  Skin: Skin is warm and dry. No rash noted. No erythema.  Psychiatric: He has a normal mood and affect. His behavior is normal. Judgment and thought content normal.      Assessment and Plan   Nursing note and vitals reviewed.

## 2015-02-15 NOTE — Assessment & Plan Note (Signed)
He has follow-up with primary care next month, recommended repeat lipid panel given dramatic weight loss over the past year.  Goal LDL less than 70. If he does not meet goal, would start zetia  10 mg daily

## 2015-02-15 NOTE — Assessment & Plan Note (Signed)
30 pound weight loss over the past year, recently getting low sugars 70s to 80s  Recommended he talk with endocrinology , may have to decrease his insulin  Reports  Metformin dose has already been decreased in half

## 2015-02-15 NOTE — Patient Instructions (Addendum)
You are doing well.  Please decrease the amiodarone down to 200 mg once a day (one pill) Ok to stop amiodarone on Friday  Please restart ramipril 5 mg daily Stop amlodipine  Please call us if you have new issues that need to be addressed before your next appt.  Your physician wants you to follow-up in: 3 months.  You will receive a reminder letter in the mail two months in advance. If you don't receive a letter, please call our office to schedule the follow-up appointment.

## 2015-02-15 NOTE — Assessment & Plan Note (Signed)
Anginal symptoms have resolved from December 2016, status post CABG

## 2015-02-15 NOTE — Assessment & Plan Note (Signed)
3 vessel coronary artery disease, status post CABG  On aspirin, Plavix  we will continue metoprolol, restart low-dose ramipril

## 2015-02-17 ENCOUNTER — Ambulatory Visit: Payer: BLUE CROSS/BLUE SHIELD | Admitting: Cardiothoracic Surgery

## 2015-02-18 ENCOUNTER — Other Ambulatory Visit: Payer: Self-pay | Admitting: Cardiothoracic Surgery

## 2015-02-18 DIAGNOSIS — Z951 Presence of aortocoronary bypass graft: Secondary | ICD-10-CM

## 2015-02-19 ENCOUNTER — Encounter: Payer: Self-pay | Admitting: Cardiothoracic Surgery

## 2015-02-19 ENCOUNTER — Ambulatory Visit (INDEPENDENT_AMBULATORY_CARE_PROVIDER_SITE_OTHER): Payer: Self-pay | Admitting: Cardiothoracic Surgery

## 2015-02-19 ENCOUNTER — Ambulatory Visit
Admission: RE | Admit: 2015-02-19 | Discharge: 2015-02-19 | Disposition: A | Discharge status: Home / Self Care | Payer: BLUE CROSS/BLUE SHIELD | Source: Ambulatory Visit | Attending: Cardiothoracic Surgery | Admitting: Cardiothoracic Surgery

## 2015-02-19 VITALS — BP 112/71 | HR 72 | Resp 16 | Ht 68.0 in | Wt 190.0 lb

## 2015-02-19 DIAGNOSIS — I25119 Atherosclerotic heart disease of native coronary artery with unspecified angina pectoris: Secondary | ICD-10-CM

## 2015-02-19 DIAGNOSIS — Z951 Presence of aortocoronary bypass graft: Secondary | ICD-10-CM

## 2015-02-19 NOTE — Progress Notes (Signed)
PCP is Vena Austria, MD Referring Provider is Minna Merritts, MD  Chief Complaint  Patient presents with  . Routine Post Op    f/u CABG 01/29/15 with a cxr    [id number] returns for routine postop check after urgent CABG x4 when he presented with unstable angina after non-ST elevation MI. He is a diabetic with previous history of myocardial infarction and PCI. The patient did well after surgery and was discharged home with a week. He had transient atrial fibrillation which converted to sinus rhythm on amiodarone. He was not discharged on Coumadin.  Since discharge he has been progressing very well. He has had no recurrent symptoms of angina. He is been walking up to an hour at a time. He is checking his blood sugars twice a day and they have been well-controlled. His incisions are healing well. His appetite strength and sleeping are gradually improving.  He returns today with a chest x-ray which shows clear lung fields, cardiac shadow.  Past Medical History  Diagnosis Date  . Essential hypertension   . Hyperlipidemia   . Coronary artery disease     a. Dating back to 18 w/ h/o MI.  8 stents;  b. 10/2003 Cath [facility]: LM 20d, RI 40p, LAD 30p, 23m, patent stent, D1 50p, LCX 30p, patent mid stent, 90d/diffuse, RCA dominant, 30p, patent stent, 82m/d, EF 44%-->Med Rx.  . Type II diabetes mellitus ([place])   . Mild sleep apnea   . Hypothyroidism   . Myocardial infarction ([place]) 1996    "small MI"   . Heart murmur     found during office visit  . Chronic kidney disease   . Asthma     pt. reports that he was treated for pnueumonia as a young child only    Past Surgical History  Procedure Laterality Date  . Thyroidectomy    . Cardiac catheterization  1993    [place]; Dr.Harold; [place]; Dr. Joneen Boers; [facility]  . Cardiac catheterization  1996    ""  . Cardiac catheterization      ''''  . Cardiac catheterization      '''   . Cardiac catheterization      '''  . Cardiac catheterization  2002    '''  . Cardiac catheterization  2006    [facility]; [place]   . Cyst excision      right- wrist- ganglion   . Nasal septum surgery    . Cardiac stent      x 10  . Cardiac catheterization N/A 01/21/2015    Procedure: Left Heart Cath and Coronary Angiography;  Surgeon: Wellington Hampshire, MD;  Location: [facility];  Service: Cardiovascular;  Laterality: N/A;  . Coronary artery bypass graft N/A 01/29/2015    Procedure: CORONARY ARTERY BYPASS GRAFTING (CABG)x four using left internal mammary artery artery and bilateral saphenous thigh vein using endoscope.;  Surgeon: Ivin Poot, MD;  Location: [facility];  Service: Open Heart Surgery;  Laterality: N/A;  . Tee without cardioversion N/A 01/29/2015    Procedure: TRANSESOPHAGEAL ECHOCARDIOGRAM (TEE);  Surgeon: Ivin Poot, MD;  Location: [facility];  Service: Open Heart Surgery;  Laterality: N/A;    Family History  Problem Relation Age of Onset  . Heart attack Mother   . Heart attack Father   . Cancer Father 59    early prostate cancer  . Drug abuse Sister   . Cirrhosis  Sister     Social History Social History  Substance Use Topics  . Smoking status: Never Smoker   . Smokeless tobacco: Never Used  . Alcohol Use: 0.5 oz/week    1 Standard drinks or equivalent per week     Comment: occassional  ( once per week) beer & sometimes alcohol    Current Outpatient Prescriptions  Medication Sig Dispense Refill  . aspirin 81 MG tablet Take 81 mg by mouth daily.    . folic acid (FOLVITE) A999333 MCG tablet Take 400 mcg by mouth daily.    . furosemide (LASIX) 40 MG tablet Take 1 tablet (40 mg total) by mouth daily as needed. 30 tablet 1  . glimepiride (AMARYL) 4 MG tablet Take 1 tablet (4 mg total) by mouth daily with breakfast. 30 tablet 1  . Insulin Glargine (LANTUS) 100 UNIT/ML Solostar Pen Inject 24 Units into the skin daily at 10 pm. 15 mL 11  .  levothyroxine (SYNTHROID) 100 MCG tablet Take 1 tablet (100 mcg total) by mouth daily before breakfast. 90 tablet 0  . metFORMIN (GLUCOPHAGE-XR) 500 MG 24 hr tablet Take 1 tablet (500 mg total) by mouth 2 (two) times daily with a meal. 60 tablet 1  . metoprolol tartrate (LOPRESSOR) 50 MG tablet Take 1 tablet (50 mg total) by mouth 2 (two) times daily. 180 tablet 3  . Multiple Vitamin (MULTIVITAMIN) tablet Take 1 tablet by mouth 2 (two) times daily.     . Omega-3 Fatty Acids (FISH OIL PO) Take 1,800 mg by mouth daily.     . ramipril (ALTACE) 5 MG capsule Take 1 capsule (5 mg total) by mouth daily. 90 capsule 3  . simvastatin (ZOCOR) 40 MG tablet TAKE 1 BY MOUTH DAILY (Patient taking differently: TAKE 1 BY MOUTH DAILY AT BEDTIME) 90 tablet 0  . clopidogrel (PLAVIX) 75 MG tablet Take 1 tablet (75 mg total) by mouth daily. 30 tablet 3  . oxyCODONE (OXY IR/ROXICODONE) 5 MG immediate release tablet Take 1-2 tablets (5-10 mg total) by mouth every 4 (four) hours as needed for severe pain. (Patient not taking: Reported on 02/19/2015) 50 tablet 0   No current facility-administered medications for this visit.    No Known Allergies  Review of Systems  No fever Gradually improving overall strength  BP 112/71 mmHg  Pulse 72  Resp 16  Ht 5\' 8"  (1.727 m)  Wt 190 lb (86.183 kg)  BMI 28.90 kg/m2  SpO2 97% Physical Exam Alert and oriented, comfortable Lungs clear Right leg incision with mild eschar, no cellulitis Sternal incision well-healed Left chest tube site with small eschar clean with peroxide and silver nitrate and Neosporin dressing applied Mild right ankle edema Heart rate regular without gallop or murmur  Diagnostic Tests: Chest x-ray clear Impression: Doing well 3 weeks postoperative CABG Activity issues--he may not drive, he can lift up to 20 pounds. Return to work statement provided for mid February. He will stop the amiodarone . He'll continue Plavix per  Dr Rockey Situ Plan: Return  for followup assessment and monitoring progress in 6 weeks  Len Childs, MD [facility] [phone]

## 2015-03-03 ENCOUNTER — Ambulatory Visit: Payer: BLUE CROSS/BLUE SHIELD | Admitting: Nurse Practitioner

## 2015-03-08 ENCOUNTER — Encounter: Payer: BLUE CROSS/BLUE SHIELD | Attending: Cardiovascular Disease | Admitting: *Deleted

## 2015-03-08 VITALS — BP 128/80 | HR 80 | Ht 67.6 in | Wt 195.6 lb

## 2015-03-08 DIAGNOSIS — Z951 Presence of aortocoronary bypass graft: Secondary | ICD-10-CM | POA: Diagnosis present

## 2015-03-08 NOTE — Patient Instructions (Signed)
Patient Instructions  Patient Details  Name: Juan [name] MRN: [id number] Date of Birth: 03/31/1955 Referring Provider:  Minna Merritts, MD  Below are the personal goals you chose as well as exercise and nutrition goals. Our goal is to help you keep on track towards obtaining and maintaining your goals. We will be discussing your progress on these goals with you throughout the program.  Initial Exercise Prescription:     Initial Exercise Prescription - 03/08/15 1400    Date of Initial Exercise Prescription   Date 03/08/15   Treadmill   MPH 3   Grade 0   Minutes 20   Bike   Level 0.4   Watts 15   Minutes 20   Recumbant Bike   Level 6   RPM 50   Watts 50   Minutes 20   NuStep   Level 4   Watts 55   Minutes 20   Arm Ergometer   Level 1   Watts 10   Minutes 15   Arm/Foot Ergometer   Level 4   Watts 15   Minutes 15   Cybex   Level 4   RPM 60   Minutes 20   Recumbant Elliptical   Level 2   RPM 40   Watts 20   Minutes 20   Elliptical   Level 1   Speed 3   Minutes 5   REL-XR   Level 3   Watts 50   Minutes 20   T5 Nustep   Level 3   Watts 30   Minutes 20   Biostep-RELP   Level 5   Watts 40   Minutes 20   Prescription Details   Frequency (times per week) 3   Duration Progress to 50 minutes of aerobic without signs/symptoms of physical distress   Intensity   THRR REST +  30   Ratings of Perceived Exertion 11-15   Progression Continue progressive overload as per policy without signs/symptoms or physical distress.   Resistance Training   Training Prescription Yes   Weight 4   Reps 10-15      Exercise Goals: Frequency: Be able to perform aerobic exercise three times per week working toward 3-5 days per week.  Intensity: Work with a perceived exertion of 11 (fairly light) - 15 (hard) as tolerated. Follow your new exercise prescription and watch for changes in prescription as you progress with the program. Changes will be reviewed with you when  they are made.  Duration: You should be able to do 30 minutes of continuous aerobic exercise in addition to a 5 minute warm-up and a 5 minute cool-down routine.  Nutrition Goals: Your personal nutrition goals will be established when you do your nutrition analysis with the dietician.  The following are nutrition guidelines to follow: Cholesterol < 200mg /day Sodium < 1500mg /day Fiber: Men over 50 yrs - 30 grams per day  Personal Goals:     Personal Goals and Risk Factors at Admission - 03/08/15 1507    Personal Goals and Risk Factors on Admission    Weight Management Yes   Intervention Learn and follow the exercise and diet guidelines while in the program. Utilize the nutrition and education classes to help gain knowledge of the diet and exercise expectations in the program   Admit Weight 195 lb (88.451 kg)   Goal Weight 170 lb (77.111 kg)  1 year ago [name] was 230lbs   Increase Aerobic Exercise and Physical Activity Yes   Intervention While in  program, learn and follow the exercise prescription taught. Start at a low level workload and increase workload after able to maintain previous level for 30 minutes. Increase time before increasing intensity.   Take Less Medication Yes   Intervention Learn your risk factors and begin the lifestyle modifications for risk factor control during your time in the program.   Goal Blood glucose control identified by blood glucose values, HgbA1C. Participant verbalizes understanding of the signs/symptoms of hyper/hypo glycemia, proper foot care and importance of medication and nutrition plan for blood glucose control.   Intervention Provide nutrition & aerobic exercise along with prescribed medications to achieve blood glucose in normal ranges: Fasting 65-99 mg/dL   Hypertension Yes   Goal Participant will see blood pressure controlled within the values of 140/46mm/Hg or within value directed by their physician.   Intervention Provide nutrition & aerobic  exercise along with prescribed medications to achieve BP 140/90 or less.   Lipids Yes   Goal Cholesterol controlled with medications as prescribed, with individualized exercise RX and with personalized nutrition plan. Value goals: LDL < 70mg , HDL > 40mg . Participant states understanding of desired cholesterol values and following prescriptions.   Intervention Provide nutrition & aerobic exercise along with prescribed medications to achieve LDL 70mg , HDL >40mg .      Tobacco Use Initial Evaluation: History  Smoking status  . Never Smoker   Smokeless tobacco  . Never Used    Copy of goals given to participant.

## 2015-03-08 NOTE — Addendum Note (Signed)
Addended by: Gerlene Burdock on: 03/08/2015 03:16 PM   Modules accepted: Orders

## 2015-03-08 NOTE — Progress Notes (Signed)
Cardiac Individual Treatment Plan  Patient Details  Name: Juan [name] MRN: [id number] Date of Birth: [birth year] Referring Provider:  Minna Merritts, MD  Initial Encounter Date: Date: 03/08/15  Visit Diagnosis: S/P CABG x 4 - Plan: CARDIAC REHAB 30 DAY REVIEW  Patient's Home Medications on Admission:  Current outpatient prescriptions:  .  aspirin 81 MG tablet, Take 81 mg by mouth daily., Disp: , Rfl:  .  clopidogrel (PLAVIX) 75 MG tablet, Take 1 tablet (75 mg total) by mouth daily., Disp: 30 tablet, Rfl: 3 .  folic acid (FOLVITE) 503 MCG tablet, Take 400 mcg by mouth daily., Disp: , Rfl:  .  Insulin Glargine (LANTUS) 100 UNIT/ML Solostar Pen, Inject 24 Units into the skin daily at 10 pm., Disp: 15 mL, Rfl: 11 .  levothyroxine (SYNTHROID) 100 MCG tablet, Take 1 tablet (100 mcg total) by mouth daily before breakfast., Disp: 90 tablet, Rfl: 0 .  metFORMIN (GLUCOPHAGE-XR) 500 MG 24 hr tablet, Take 1 tablet (500 mg total) by mouth 2 (two) times daily with a meal., Disp: 60 tablet, Rfl: 1 .  metoprolol tartrate (LOPRESSOR) 50 MG tablet, Take 1 tablet (50 mg total) by mouth 2 (two) times daily., Disp: 180 tablet, Rfl: 3 .  Multiple Vitamin (MULTIVITAMIN) tablet, Take 1 tablet by mouth 2 (two) times daily. , Disp: , Rfl:  .  Omega-3 Fatty Acids (FISH OIL PO), Take 1,800 mg by mouth daily. , Disp: , Rfl:  .  ramipril (ALTACE) 5 MG capsule, Take 1 capsule (5 mg total) by mouth daily., Disp: 90 capsule, Rfl: 3 .  simvastatin (ZOCOR) 40 MG tablet, TAKE 1 BY MOUTH DAILY (Patient taking differently: TAKE 1 BY MOUTH DAILY AT BEDTIME), Disp: 90 tablet, Rfl: 0 .  furosemide (LASIX) 40 MG tablet, Take 1 tablet (40 mg total) by mouth daily as needed. (Patient not taking: Reported on 03/08/2015), Disp: 30 tablet, Rfl: 1 .  glimepiride (AMARYL) 4 MG tablet, Take 1 tablet (4 mg total) by mouth daily with breakfast. (Patient not taking: Reported on 03/08/2015), Disp: 30 tablet, Rfl: 1 .  oxyCODONE (OXY  IR/ROXICODONE) 5 MG immediate release tablet, Take 1-2 tablets (5-10 mg total) by mouth every 4 (four) hours as needed for severe pain. (Patient not taking: Reported on 02/19/2015), Disp: 50 tablet, Rfl: 0  Past Medical History: Past Medical History  Diagnosis Date  . Essential hypertension   . Hyperlipidemia   . Coronary artery disease     a. Dating back to 52 w/ h/o MI.  8 stents;  b. 10/2003 Cath [facility]: LM 20d, RI 40p, LAD 30p, 61m patent stent, D1 50p, LCX 30p, patent mid stent, 90d/diffuse, RCA dominant, 30p, patent stent, 468m, EF 44%-->Med Rx.  . [place] II [place]  . Mild sleep apnea   . Hypothyroidism   . Myocardial infarction ([facility]    "small MI"   . Heart murmur     found during office visit  . Chronic kidney disease   . Asthma     pt. reports that he was treated for pnueumonia as a young child only    Tobacco Use: History  Smoking status  . Never Smoker   Smokeless tobacco  . Never Used    Labs: Recent Review Flowsheet Data    Labs for ITP Cardiac and Pulmonary Rehab Latest Ref Rng 01/29/2015 01/29/2015 01/29/2015 01/29/2015 01/30/2015   PHART 7.350 - 7.450 7.431 7.397 7.355 - -   PCO2ART 35.0 - 45.0 mmHg 35.9 40.5  42.0 - -   HCO3 20.0 - 24.0 mEq/L 24.2(H) 25.1(H) 23.5 - -   TCO2 0 - 100 mmol/L _0 ACIDBASEDEF 0.0 - 2.0 mmol/L - - 2.0 - -   O2SAT - 99.0 99.0 98.0 - -       Exercise Target Goals: Date: 03/08/15  Exercise Program Goal: Individual exercise prescription set with THRR, safety & activity barriers. Participant demonstrates ability to understand and report RPE using BORG scale, to self-measure pulse accurately, and to acknowledge the importance of the exercise prescription.  Exercise Prescription Goal: Starting with aerobic activity 30 plus minutes a day, 3 days per week for initial exercise prescription. Provide home exercise prescription and guidelines that participant acknowledges understanding prior to  discharge.  Activity Barriers & Risk Stratification:     Activity Barriers & Cardiac Risk Stratification - 03/08/15 1501    Activity Barriers & Cardiac Risk Stratification   Activity Barriers None   Cardiac Risk Stratification High      6 Minute Walk:     6 Minute Walk      03/08/15 1411       6 Minute Walk   Phase Initial     Distance 1700 feet     Walk Time 6 minutes     # of Rest Breaks 0     MPH 3.2     RPE 8     Resting HR 78 bpm     Resting BP 128/80 mmHg     Max Ex. HR 110 bpm     Max Ex. BP 122/68 mmHg     Pre/Post BP   Pre/Post BP? --        Initial Exercise Prescription:     Initial Exercise Prescription - 03/08/15 1400    Date of Initial Exercise Prescription   Date 03/08/15   Treadmill   MPH 3   Grade 0   Minutes 20   Bike   Level 0.4   Watts 15   Minutes 20   Recumbant Bike   Level 6   RPM 50   Watts 50   Minutes 20   NuStep   Level 4   Watts 55   Minutes 20   Arm Ergometer   Level 1   Watts 10   Minutes 15   Arm/Foot Ergometer   Level 4   Watts 15   Minutes 15   Cybex   Level 4   RPM 60   Minutes 20   Recumbant Elliptical   Level 2   RPM 40   Watts 20   Minutes 20   Elliptical   Level 1   Speed 3   Minutes 5   REL-XR   Level 3   Watts 50   Minutes 20   T5 Nustep   Level 3   Watts 30   Minutes 20   Biostep-RELP   Level 5   Watts 40   Minutes 20   Prescription Details   Frequency (times per week) 3   Duration Progress to 50 minutes of aerobic without signs/symptoms of physical distress   Intensity   THRR REST +  30   Ratings of Perceived Exertion 11-15   Progression Continue progressive overload as per policy without signs/symptoms or physical distress.   Resistance Training   Training Prescription Yes   Weight 4   Reps 10-15      Exercise Prescription Changes:   Discharge Exercise Prescription (Final Exercise  Prescription Changes):   Nutrition:  Target Goals: Understanding of nutrition  guidelines, daily intake of sodium <1545m, cholesterol <2066m calories 30% from fat and 7% or less from saturated fats, daily to have 5 or more servings of fruits and vegetables.  Biometrics:     Pre Biometrics - 03/08/15 1410    Pre Biometrics   Height 5' 7.6" (1.717 m)   Weight 195 lb 9.6 oz (88.724 kg)   Waist Circumference 40.5 inches   Hip Circumference 38.25 inches   Waist to Hip Ratio 1.06 %   BMI (Calculated) 30.2       Nutrition Therapy Plan and Nutrition Goals:     Nutrition Therapy & Goals - 03/08/15 1505    Nutrition Therapy   Drug/Food Interactions Statins/Certain Fruits   Personal Nutrition Goals   Personal Goal #1 Decrease sodium. ([name] said he has already met with a Registered Dietician that Dr. GoRockey Siturranged so he feels he doesn't need to meet individually with our Cardiac Rehab Registered Dietician. )   Personal Goal #2 Limit saturated fats.   Personal Goal #3 Eat fruit with the skin on it ie peaches, apples but nothing in cans is what he previous dietician taught [name] he reprots.    Intervention Plan   Intervention Using nutrition plan and personal goals to gain a healthy nutrition lifestyle. Add exercise as prescribed.      Nutrition Discharge: Rate Your Plate Scores:   Nutrition Goals Re-Evaluation:   Psychosocial: Target Goals: Acknowledge presence or absence of depression, maximize coping skills, provide positive support system. Participant is able to verbalize types and ability to use techniques and skills needed for reducing stress and depression.  Initial Review & Psychosocial Screening:     Initial Psych Review & Screening - 03/08/15 [place]   Comments JoWille Glaseras to pay a $3000 deductible for the new 2017 so he said he may not attend all 36 sessions since each session will be an out of pocket expense.    Barriers   Psychosocial barriers to participate in program There are no identifiable barriers or  psychosocial needs.   Screening Interventions   Interventions Encouraged to exercise      Quality of Life Scores:     Quality of Life - 03/08/15 1539    Quality of Life Scores   Health/Function Pre 26 %   Socioeconomic Pre 27.86 %   Psych/Spiritual Pre 28.29 %   Family Pre 30 %   GLOBAL Pre 27.44 %      PHQ-9:     Recent Review Flowsheet Data    [facility]   Decreased Interest 0   Down, Depressed, Hopeless 0   PHQ - 2 Score 0   Altered sleeping 2   Tired, decreased energy 0   Change in appetite 0   Feeling bad or failure about yourself  0   Trouble concentrating 0   Moving slowly or fidgety/restless 0   PHQ-9 Score 2   Difficult doing work/chores Not difficult at all      Psychosocial Evaluation and Intervention:   Psychosocial Re-Evaluation:   Vocational Rehabilitation: Provide vocational rehab assistance to qualifying candidates.   Vocational Rehab Evaluation & Intervention:     Vocational Rehab - 03/08/15 1503    Initial Vocational Rehab Evaluation & Intervention   Assessment shows need for Vocational Rehabilitation No      Education: Education Goals: Education classes will  be provided on a weekly basis, covering required topics. Participant will state understanding/return demonstration of topics presented.  Learning Barriers/Preferences:     Learning Barriers/Preferences - 03/08/15 1502    Learning Barriers/Preferences   Learning Barriers None   Learning Preferences None      Education Topics: General Nutrition Guidelines/Fats and Fiber: -Group instruction provided by verbal, written material, models and posters to present the general guidelines for heart healthy nutrition. Gives an explanation and review of dietary fats and fiber.   Controlling Sodium/Reading Food Labels: -Group verbal and written material supporting the discussion of sodium use in heart healthy nutrition. Review and explanation with models,  verbal and written materials for utilization of the food label.   Exercise Physiology & Risk Factors: - Group verbal and written instruction with models to review the exercise physiology of the cardiovascular system and associated critical values. Details cardiovascular disease risk factors and the goals associated with each risk factor.   Aerobic Exercise & Resistance Training: - Gives group verbal and written discussion on the health impact of inactivity. On the components of aerobic and resistive training programs and the benefits of this training and how to safely progress through these programs.   Flexibility, Balance, General Exercise Guidelines: - Provides group verbal and written instruction on the benefits of flexibility and balance training programs. Provides general exercise guidelines with specific guidelines to those with heart or lung disease. Demonstration and skill practice provided.   Stress Management: - Provides group verbal and written instruction about the health risks of elevated stress, cause of high stress, and healthy ways to reduce stress.   Depression: - Provides group verbal and written instruction on the correlation between heart/lung disease and depressed mood, treatment options, and the stigmas associated with seeking treatment.   Anatomy & Physiology of the Heart: - Group verbal and written instruction and models provide basic cardiac anatomy and physiology, with the coronary electrical and arterial systems. Review of: AMI, Angina, Valve disease, Heart Failure, Cardiac Arrhythmia, Pacemakers, and the ICD.   Cardiac Procedures: - Group verbal and written instruction and models to describe the testing methods done to diagnose heart disease. Reviews the outcomes of the test results. Describes the treatment choices: Medical Management, Angioplasty, or Coronary Bypass Surgery.   Cardiac Medications: - Group verbal and written instruction to review commonly  prescribed medications for heart disease. Reviews the medication, class of the drug, and side effects. Includes the steps to properly store meds and maintain the prescription regimen.   Go Sex-Intimacy & Heart Disease, Get SMART - Goal Setting: - Group verbal and written instruction through game format to discuss heart disease and the return to sexual intimacy. Provides group verbal and written material to discuss and apply goal setting through the application of the S.M.A.R.T. Method.   Other Matters of the Heart: - Provides group verbal, written materials and models to describe Heart Failure, Angina, Valve Disease, and Diabetes in the realm of heart disease. Includes description of the disease process and treatment options available to the cardiac patient.   Exercise & Equipment Safety: - Individual verbal instruction and demonstration of equipment use and safety with use of the equipment.          Cardiac Rehab from 03/08/2015 in [facility] Cardiac and Pulmonary Rehab   Date  03/08/15   Educator  C. [place]   Instruction Review Code  1- partially meets, needs review/practice      Infection Prevention: - Provides verbal and written material to individual  with discussion of infection control including proper hand washing and proper equipment cleaning during exercise session.      Cardiac Rehab from 03/08/2015 in [facility] Cardiac and Pulmonary Rehab   Date  03/08/15   Educator  C. EnterkinRN   Instruction Review Code  2- meets goals/outcomes      Falls Prevention: - Provides verbal and written material to individual with discussion of falls prevention and safety.      Cardiac Rehab from 03/08/2015 in [facility] Cardiac and Pulmonary Rehab   Date  03/08/15   Educator  C. [place]   Instruction Review Code  2- meets goals/outcomes      Diabetes: - Individual verbal and written instruction to review signs/symptoms of diabetes, desired ranges of glucose level fasting, after meals and with  exercise. Advice that pre and post exercise glucose checks will be done for 3 sessions at entry of program.      Cardiac Rehab from 03/08/2015 in [facility] Cardiac and Pulmonary Rehab   Date  03/08/15   Educator  Loletha Grayer ENterkinRN   Instruction Review Code  1- partially meets, needs review/practice       Knowledge Questionnaire Score:     Knowledge Questionnaire Score - 03/08/15 1502    Knowledge Questionnaire Score   Pre Score 26      Personal Goals and Risk Factors at Admission:     Personal Goals and Risk Factors at Admission - 03/08/15 1507    Personal Goals and Risk Factors on Admission    Weight Management Yes   Intervention Learn and follow the exercise and diet guidelines while in the program. Utilize the nutrition and education classes to help gain knowledge of the diet and exercise expectations in the program   Admit Weight 195 lb (88.451 kg)   Goal Weight 170 lb (77.111 kg)  1 year ago [name] was 230lbs   Increase Aerobic Exercise and Physical Activity Yes   Intervention While in program, learn and follow the exercise prescription taught. Start at a low level workload and increase workload after able to maintain previous level for 30 minutes. Increase time before increasing intensity.   Take Less Medication Yes   Intervention Learn your risk factors and begin the lifestyle modifications for risk factor control during your time in the program.   Goal Blood glucose control identified by blood glucose values, HgbA1C. Participant verbalizes understanding of the signs/symptoms of hyper/hypo glycemia, proper foot care and importance of medication and nutrition plan for blood glucose control.   Intervention Provide nutrition & aerobic exercise along with prescribed medications to achieve blood glucose in normal ranges: Fasting 65-99 mg/dL   Hypertension Yes   Goal Participant will see blood pressure controlled within the values of 140/23m/Hg or within value directed by their physician.    Intervention Provide nutrition & aerobic exercise along with prescribed medications to achieve BP 140/90 or less.   Lipids Yes   Goal Cholesterol controlled with medications as prescribed, with individualized exercise RX and with personalized nutrition plan. Value goals: LDL < 761m HDL > 4073mParticipant states understanding of desired cholesterol values and following prescriptions.   Intervention Provide nutrition & aerobic exercise along with prescribed medications to achieve LDL <36m58mDL >40mg57m   Personal Goals and Risk Factors Review:    Personal Goals Discharge (Final Personal Goals and Risk Factors Review):    ITP Comments:     ITP Comments      03/08/15 1511  ITP Comments Decrease sodium. ([name] said he has already met with a Registered Dietician that Dr. Rockey Situ arranged so he feels he doesn't need to meet individually with our Cardiac Rehab Registered [place]. ) [name] already walks 7 days a week for 60 minutes at a time for exercise. [name] reports he had a heart attack in his 98s and  has a total of 10 stents. He had open heart surgery x 4 in 2016. [name] showed me where they tried to take a  vein for a graft in his right lower leg and sometimes he has right leg numbness which the MD knows about. [name] reports the numbness is less when he walks.           Comments: REady to start Cardiac Rehab.

## 2015-03-08 NOTE — Progress Notes (Signed)
Cardiac Individual Treatment Plan  Patient Details  Name: Juan [name] MRN: [id number] Date of Birth: 1955-11-24 Referring Provider:  Minna Merritts, MD  Initial Encounter Date: Date: 03/08/15  Visit Diagnosis: S/P CABG x 4  Patient's Home Medications on Admission:  Current outpatient prescriptions:  .  aspirin 81 MG tablet, Take 81 mg by mouth daily., Disp: , Rfl:  .  clopidogrel (PLAVIX) 75 MG tablet, Take 1 tablet (75 mg total) by mouth daily., Disp: 30 tablet, Rfl: 3 .  folic acid (FOLVITE) 798 MCG tablet, Take 400 mcg by mouth daily., Disp: , Rfl:  .  Insulin Glargine (LANTUS) 100 UNIT/ML Solostar Pen, Inject 24 Units into the skin daily at 10 pm., Disp: 15 mL, Rfl: 11 .  levothyroxine (SYNTHROID) 100 MCG tablet, Take 1 tablet (100 mcg total) by mouth daily before breakfast., Disp: 90 tablet, Rfl: 0 .  metFORMIN (GLUCOPHAGE-XR) 500 MG 24 hr tablet, Take 1 tablet (500 mg total) by mouth 2 (two) times daily with a meal., Disp: 60 tablet, Rfl: 1 .  metoprolol tartrate (LOPRESSOR) 50 MG tablet, Take 1 tablet (50 mg total) by mouth 2 (two) times daily., Disp: 180 tablet, Rfl: 3 .  Multiple Vitamin (MULTIVITAMIN) tablet, Take 1 tablet by mouth 2 (two) times daily. , Disp: , Rfl:  .  Omega-3 Fatty Acids (FISH OIL PO), Take 1,800 mg by mouth daily. , Disp: , Rfl:  .  ramipril (ALTACE) 5 MG capsule, Take 1 capsule (5 mg total) by mouth daily., Disp: 90 capsule, Rfl: 3 .  simvastatin (ZOCOR) 40 MG tablet, TAKE 1 BY MOUTH DAILY (Patient taking differently: TAKE 1 BY MOUTH DAILY AT BEDTIME), Disp: 90 tablet, Rfl: 0 .  furosemide (LASIX) 40 MG tablet, Take 1 tablet (40 mg total) by mouth daily as needed. (Patient not taking: Reported on 03/08/2015), Disp: 30 tablet, Rfl: 1 .  glimepiride (AMARYL) 4 MG tablet, Take 1 tablet (4 mg total) by mouth daily with breakfast. (Patient not taking: Reported on 03/08/2015), Disp: 30 tablet, Rfl: 1 .  oxyCODONE (OXY IR/ROXICODONE) 5 MG immediate release  tablet, Take 1-2 tablets (5-10 mg total) by mouth every 4 (four) hours as needed for severe pain. (Patient not taking: Reported on 02/19/2015), Disp: 50 tablet, Rfl: 0  Past Medical History: Past Medical History  Diagnosis Date  . Essential hypertension   . Hyperlipidemia   . Coronary artery disease     a. Dating back to 55 w/ h/o MI.  8 stents;  b. 10/2003 Cath [facility]: LM 20d, RI 40p, LAD 30p, 54m patent stent, D1 50p, LCX 30p, patent mid stent, 90d/diffuse, RCA dominant, 30p, patent stent, 496m, EF 44%-->Med Rx.  . Type II diabetes mellitus ([facility]  . Mild sleep apnea   . Hypothyroidism   . [place]    "small MI"   . Heart murmur     found during office visit  . Chronic kidney disease   . Asthma     pt. reports that he was treated for pnueumonia as a young child only    Tobacco Use: History  Smoking status  . Never Smoker   Smokeless tobacco  . Never Used    Labs: Recent Review Flowsheet Data    Labs for ITP Cardiac and Pulmonary Rehab Latest Ref Rng 01/29/2015 01/29/2015 01/29/2015 01/29/2015 01/30/2015   PHART 7.350 - 7.450 7.431 7.397 7.355 - -   PCO2ART 35.0 - 45.0 mmHg 35.9 40.5 42.0 - -   HCO3 20.0 -  24.0 mEq/L 24.2(H) 25.1(H) 23.5 - -   TCO2 0 - 100 mmol/L 25 26 25 24 24    ACIDBASEDEF 0.0 - 2.0 mmol/L - - 2.0 - -   O2SAT - 99.0 99.0 98.0 - -       Exercise Target Goals: Date: 03/08/15  Exercise Program Goal: Individual exercise prescription set with THRR, safety & activity barriers. Participant demonstrates ability to understand and report RPE using BORG scale, to self-measure pulse accurately, and to acknowledge the importance of the exercise prescription.  Exercise Prescription Goal: Starting with aerobic activity 30 plus minutes a day, 3 days per week for initial exercise prescription. Provide home exercise prescription and guidelines that participant acknowledges understanding prior to discharge.  Activity Barriers & Risk  Stratification:     Activity Barriers & Cardiac Risk Stratification - 03/08/15 1501    Activity Barriers & Cardiac Risk Stratification   Activity Barriers None   Cardiac Risk Stratification High      6 Minute Walk:     6 Minute Walk      03/08/15 1411       6 Minute Walk   Phase Initial     Distance 1700 feet     Walk Time 6 minutes     # of Rest Breaks 0     MPH 3.2     RPE 8     Resting HR 78 bpm     Resting BP 128/80 mmHg     Max Ex. HR 110 bpm     Max Ex. BP 122/68 mmHg     Pre/Post BP   Pre/Post BP? --        Initial Exercise Prescription:     Initial Exercise Prescription - 03/08/15 1400    Date of Initial Exercise Prescription   Date 03/08/15   Treadmill   MPH 3   Grade 0   Minutes 20   Bike   Level 0.4   Watts 15   Minutes 20   Recumbant Bike   Level 6   RPM 50   Watts 50   Minutes 20   NuStep   Level 4   Watts 55   Minutes 20   Arm Ergometer   Level 1   Watts 10   Minutes 15   Arm/Foot Ergometer   Level 4   Watts 15   Minutes 15   Cybex   Level 4   RPM 60   Minutes 20   Recumbant Elliptical   Level 2   RPM 40   Watts 20   Minutes 20   Elliptical   Level 1   Speed 3   Minutes 5   REL-XR   Level 3   Watts 50   Minutes 20   T5 Nustep   Level 3   Watts 30   Minutes 20   Biostep-RELP   Level 5   Watts 40   Minutes 20   Prescription Details   Frequency (times per week) 3   Duration Progress to 50 minutes of aerobic without signs/symptoms of physical distress   Intensity   THRR REST +  30   Ratings of Perceived Exertion 11-15   Progression Continue progressive overload as per policy without signs/symptoms or physical distress.   Resistance Training   Training Prescription Yes   Weight 4   Reps 10-15      Exercise Prescription Changes:   Discharge Exercise Prescription (Final Exercise Prescription Changes):   Nutrition:  Target Goals:  Understanding of nutrition guidelines, daily intake of sodium <1545m,  cholesterol <2065m calories 30% from fat and 7% or less from saturated fats, daily to have 5 or more servings of fruits and vegetables.  Biometrics:     Pre Biometrics - 03/08/15 1410    Pre Biometrics   Height 5' 7.6" (1.717 m)   Weight 195 lb 9.6 oz (88.724 kg)   Waist Circumference 40.5 inches   Hip Circumference 38.25 inches   Waist to Hip Ratio 1.06 %   BMI (Calculated) 30.2       Nutrition Therapy Plan and Nutrition Goals:     Nutrition Therapy & Goals - 03/08/15 1505    Nutrition Therapy   Drug/Food Interactions Statins/Certain Fruits   Personal Nutrition Goals   Personal Goal #1 Decrease sodium. (Juan [name] said he has already met with a Registered Dietician that Dr. GoRockey Siturranged so he feels he doesn't need to meet individually with our Cardiac Rehab Registered Dietician. )   Personal Goal #2 Limit saturated fats.   Personal Goal #3 Eat fruit with the skin on it ie peaches, apples but nothing in cans is what he previous dietician taught Juan [name] he reprots.    Intervention Plan   Intervention Using nutrition plan and personal goals to gain a healthy nutrition lifestyle. Add exercise as prescribed.      Nutrition Discharge: Rate Your Plate Scores:   Nutrition Goals Re-Evaluation:   Psychosocial: Target Goals: Acknowledge presence or absence of depression, maximize coping skills, provide positive support system. Participant is able to verbalize types and ability to use techniques and skills needed for reducing stress and depression.  Initial Review & Psychosocial Screening:     Initial Psych Review & Screening - 03/08/15 [id number]   Comments JoWille Glaseras to pay a $3000 deductible for the new 2017 so he said he may not attend all 36 sessions since each session will be an out of pocket expense.    Barriers   Psychosocial barriers to participate in program There are no identifiable barriers or psychosocial needs.   Screening  Interventions   Interventions Encouraged to exercise      Quality of Life Scores:   PHQ-9:     Recent Review Flowsheet Data    [facility]   Decreased Interest 0   Down, Depressed, Hopeless 0   PHQ - 2 Score 0   Altered sleeping 2   Tired, decreased energy 0   Change in appetite 0   Feeling bad or failure about yourself  0   Trouble concentrating 0   Moving slowly or fidgety/restless 0   PHQ-9 Score 2   Difficult doing work/chores Not difficult at all      Psychosocial Evaluation and Intervention:   Psychosocial Re-Evaluation:   Vocational Rehabilitation: Provide vocational rehab assistance to qualifying candidates.   Vocational Rehab Evaluation & Intervention:     Vocational Rehab - 03/08/15 1503    Initial Vocational Rehab Evaluation & Intervention   Assessment shows need for Vocational Rehabilitation No      Education: Education Goals: Education classes will be provided on a weekly basis, covering required topics. Participant will state understanding/return demonstration of topics presented.  Learning Barriers/Preferences:     Learning Barriers/Preferences - 03/08/15 1502    Learning Barriers/Preferences   Learning Barriers None   Learning Preferences None      Education Topics: General Nutrition Guidelines/Fats and Fiber: -Group instruction  provided by verbal, written material, models and posters to present the general guidelines for heart healthy nutrition. Gives an explanation and review of dietary fats and fiber.   Controlling Sodium/Reading Food Labels: -Group verbal and written material supporting the discussion of sodium use in heart healthy nutrition. Review and explanation with models, verbal and written materials for utilization of the food label.   Exercise Physiology & Risk Factors: - Group verbal and written instruction with models to review the exercise physiology of the cardiovascular system and associated  critical values. Details cardiovascular disease risk factors and the goals associated with each risk factor.   Aerobic Exercise & Resistance Training: - Gives group verbal and written discussion on the health impact of inactivity. On the components of aerobic and resistive training programs and the benefits of this training and how to safely progress through these programs.   Flexibility, Balance, General Exercise Guidelines: - Provides group verbal and written instruction on the benefits of flexibility and balance training programs. Provides general exercise guidelines with specific guidelines to those with heart or lung disease. Demonstration and skill practice provided.   Stress Management: - Provides group verbal and written instruction about the health risks of elevated stress, cause of high stress, and healthy ways to reduce stress.   Depression: - Provides group verbal and written instruction on the correlation between heart/lung disease and depressed mood, treatment options, and the stigmas associated with seeking treatment.   Anatomy & Physiology of the Heart: - Group verbal and written instruction and models provide basic cardiac anatomy and physiology, with the coronary electrical and arterial systems. Review of: AMI, Angina, Valve disease, Heart Failure, Cardiac Arrhythmia, Pacemakers, and the ICD.   Cardiac Procedures: - Group verbal and written instruction and models to describe the testing methods done to diagnose heart disease. Reviews the outcomes of the test results. Describes the treatment choices: Medical Management, Angioplasty, or Coronary Bypass Surgery.   Cardiac Medications: - Group verbal and written instruction to review commonly prescribed medications for heart disease. Reviews the medication, class of the drug, and side effects. Includes the steps to properly store meds and maintain the prescription regimen.   Go Sex-Intimacy & Heart Disease, Get SMART -  Goal Setting: - Group verbal and written instruction through game format to discuss heart disease and the return to sexual intimacy. Provides group verbal and written material to discuss and apply goal setting through the application of the S.M.A.R.T. Method.   Other Matters of the Heart: - Provides group verbal, written materials and models to describe Heart Failure, Angina, Valve Disease, and Diabetes in the realm of heart disease. Includes description of the disease process and treatment options available to the cardiac patient.   Exercise & Equipment Safety: - Individual verbal instruction and demonstration of equipment use and safety with use of the equipment.          Cardiac Rehab from 03/08/2015 in [facility] Cardiac and Pulmonary Rehab   Date  03/08/15   Educator  C. EnterkinRN   Instruction Review Code  1- partially meets, needs review/practice      Infection Prevention: - Provides verbal and written material to individual with discussion of infection control including proper hand washing and proper equipment cleaning during exercise session.      Cardiac Rehab from 03/08/2015 in [facility] Cardiac and Pulmonary Rehab   Date  03/08/15   Educator  C. EnterkinRN   Instruction Review Code  2- meets goals/outcomes      Falls Prevention: -  Provides verbal and written material to individual with discussion of falls prevention and safety.      Cardiac Rehab from 03/08/2015 in [facility] Cardiac and Pulmonary Rehab   Date  03/08/15   Educator  C. [place]   Instruction Review Code  2- meets goals/outcomes      Diabetes: - Individual verbal and written instruction to review signs/symptoms of diabetes, desired ranges of glucose level fasting, after meals and with exercise. Advice that pre and post exercise glucose checks will be done for 3 sessions at entry of program.      Cardiac Rehab from 03/08/2015 in [facility] Cardiac and Pulmonary Rehab   Date  03/08/15   Educator  Loletha Grayer ENterkinRN   Instruction  Review Code  1- partially meets, needs review/practice       Knowledge Questionnaire Score:     Knowledge Questionnaire Score - 03/08/15 1502    Knowledge Questionnaire Score   Pre Score 26      Personal Goals and Risk Factors at Admission:     Personal Goals and Risk Factors at Admission - 03/08/15 1507    Personal Goals and Risk Factors on Admission    Weight Management Yes   Intervention Learn and follow the exercise and diet guidelines while in the program. Utilize the nutrition and education classes to help gain knowledge of the diet and exercise expectations in the program   Admit Weight 195 lb (88.451 kg)   Goal Weight 170 lb (77.111 kg)  1 year ago Juan [name] was 230lbs   Increase Aerobic Exercise and Physical Activity Yes   Intervention While in program, learn and follow the exercise prescription taught. Start at a low level workload and increase workload after able to maintain previous level for 30 minutes. Increase time before increasing intensity.   Take Less Medication Yes   Intervention Learn your risk factors and begin the lifestyle modifications for risk factor control during your time in the program.   Goal Blood glucose control identified by blood glucose values, HgbA1C. Participant verbalizes understanding of the signs/symptoms of hyper/hypo glycemia, proper foot care and importance of medication and nutrition plan for blood glucose control.   Intervention Provide nutrition & aerobic exercise along with prescribed medications to achieve blood glucose in normal ranges: Fasting 65-99 mg/dL   Hypertension Yes   Goal Participant will see blood pressure controlled within the values of 140/20m/Hg or within value directed by their physician.   Intervention Provide nutrition & aerobic exercise along with prescribed medications to achieve BP 140/90 or less.   Lipids Yes   Goal Cholesterol controlled with medications as prescribed, with individualized exercise RX and with  personalized nutrition plan. Value goals: LDL < 758m HDL > 4021mParticipant states understanding of desired cholesterol values and following prescriptions.   Intervention Provide nutrition & aerobic exercise along with prescribed medications to achieve LDL <76m48mDL >40mg24m   Personal Goals and Risk Factors Review:    Personal Goals Discharge (Final Personal Goals and Risk Factors Review):    ITP Comments:   Comments: Decrease sodium is a dietary goal.. (Juan [name] said he has already met with a Registered Dietician that Dr. GollaRockey Situnged so he feels he doesn't need to meet individually with our Cardiac Rehab Registered Dietician. )

## 2015-03-08 NOTE — Progress Notes (Signed)
Daily Session Note  Patient Details  Name: Juan [name] MRN: [id number] Date of Birth: 08/07/1955 Referring Provider:  Minna Merritts, MD  Encounter Date: 03/08/2015  Check In:     Session Check In - 03/08/15 1503    Check-In   Location [facility]-Cardiac & Pulmonary Rehab   Staff Present Gerlene Burdock, RN, Drusilla Kanner, MS, ACSM CEP, Exercise Physiologist   Supervising physician immediately available to respond to emergencies See telemetry face sheet for immediately available ER MD   Medication changes reported     No   Fall or balance concerns reported    No   Warm-up and Cool-down Not performed (comment)   Resistance Training Performed No   VAD Patient? No   Pain Assessment   Currently in Pain? No/denies         Goals Met:  Proper associated with RPD/PD & O2 Sat Exercise tolerated well Personal goals reviewed No report of cardiac concerns or symptoms  Goals Unmet:  Not Applicable  Goals Comments: [name] is ready to start Cardiac Rehab.    Dr. Emily Filbert is Medical Director for [facility] and [facility].

## 2015-03-10 ENCOUNTER — Ambulatory Visit: Payer: BLUE CROSS/BLUE SHIELD | Admitting: Cardiothoracic Surgery

## 2015-03-17 ENCOUNTER — Encounter: Payer: BLUE CROSS/BLUE SHIELD | Admitting: *Deleted

## 2015-03-17 DIAGNOSIS — Z951 Presence of aortocoronary bypass graft: Secondary | ICD-10-CM | POA: Diagnosis not present

## 2015-03-17 LAB — GLUCOSE, CAPILLARY: Glucose-Capillary: 167 mg/dL — ABNORMAL HIGH (ref 65–99)

## 2015-03-17 NOTE — Progress Notes (Signed)
Daily Session Note  Patient Details  Name: Juan [name] MRN: [id number] Date of Birth: 06-Jun-1955 Referring Provider:  Minna Merritts, MD  Encounter Date: 03/17/2015  Check In:     Session Check In - 03/17/15 0921    Check-In   Location [facility]-Cardiac & Pulmonary Rehab   Staff Present Candiss Norse, MS, ACSM CEP, Exercise Physiologist;Laureen Owens Shark, BS, RRT, Respiratory Therapist;Susanne Bice, RN, BSN, CCRP   Supervising physician immediately available to respond to emergencies See telemetry face sheet for immediately available ER MD   Medication changes reported     No   Fall or balance concerns reported    No   Warm-up and Cool-down Performed on first and last piece of equipment   Resistance Training Performed No   VAD Patient? No   Pain Assessment   Currently in Pain? No/denies   Multiple Pain Sites No           Exercise Prescription Changes - 03/17/15 0900    Response to Exercise   Symptoms None   Comments First day of exercise! Patient was oriented to the gym and the equipment functions and settings. Procedures and policies of the gym were outlined and explained. The patient's individual exercise prescription and treatment plan were reviewed with them. All starting workloads were established based on the results of the functional testing  done at the initial intake visit. The plan for exercise progression was also introduced and progression will be customized based on the patient's performance and goals.    Duration Progress to 30 minutes of continuous aerobic without signs/symptoms of physical distress   Intensity Rest + 30   Progression Continue progressive overload as per policy without signs/symptoms or physical distress.   Resistance Training   Training Prescription Yes   Weight 4   Reps 10-15   Treadmill   MPH 2   Grade 0   Minutes 20      Goals Met:  Independence with exercise equipment Exercise tolerated well Personal goals reviewed No report of  cardiac concerns or symptoms Strength training completed today  Goals Unmet:  Not Applicable  Goals Comments: Patient completed exercise prescription and all exercise goals during rehab session. The exercise was tolerated well and the patient is progressing in the program.    Dr. Emily Filbert is Medical Director for [facility] and [facility].

## 2015-03-22 ENCOUNTER — Other Ambulatory Visit: Payer: Self-pay | Admitting: Cardiovascular Disease

## 2015-03-23 NOTE — Progress Notes (Signed)
Cardiac Individual Treatment Plan  Patient Details  Name: Juan [name] MRN: [id number] Date of Birth: 1955/09/13 Referring Provider:  Minna Merritts, MD  Initial Encounter Date:    Visit Diagnosis: S/P CABG x 4  Patient's Home Medications on Admission:  Current outpatient prescriptions:  .  aspirin 81 MG tablet, Take 81 mg by mouth daily., Disp: , Rfl:  .  clopidogrel (PLAVIX) 75 MG tablet, Take 1 tablet (75 mg total) by mouth daily., Disp: 30 tablet, Rfl: 3 .  folic acid (FOLVITE) 732 MCG tablet, Take 400 mcg by mouth daily., Disp: , Rfl:  .  furosemide (LASIX) 40 MG tablet, Take 1 tablet (40 mg total) by mouth daily as needed. (Patient not taking: Reported on 03/08/2015), Disp: 30 tablet, Rfl: 1 .  glimepiride (AMARYL) 4 MG tablet, Take 1 tablet (4 mg total) by mouth daily with breakfast. (Patient not taking: Reported on 03/08/2015), Disp: 30 tablet, Rfl: 1 .  Insulin Glargine (LANTUS) 100 UNIT/ML Solostar Pen, Inject 24 Units into the skin daily at 10 pm., Disp: 15 mL, Rfl: 11 .  levothyroxine (SYNTHROID) 100 MCG tablet, Take 1 tablet (100 mcg total) by mouth daily before breakfast., Disp: 90 tablet, Rfl: 0 .  metFORMIN (GLUCOPHAGE-XR) 500 MG 24 hr tablet, Take 1 tablet (500 mg total) by mouth 2 (two) times daily with a meal., Disp: 60 tablet, Rfl: 1 .  metoprolol tartrate (LOPRESSOR) 50 MG tablet, Take 1 tablet (50 mg total) by mouth 2 (two) times daily., Disp: 180 tablet, Rfl: 3 .  Multiple Vitamin (MULTIVITAMIN) tablet, Take 1 tablet by mouth 2 (two) times daily. , Disp: , Rfl:  .  Omega-3 Fatty Acids (FISH OIL PO), Take 1,800 mg by mouth daily. , Disp: , Rfl:  .  oxyCODONE (OXY IR/ROXICODONE) 5 MG immediate release tablet, Take 1-2 tablets (5-10 mg total) by mouth every 4 (four) hours as needed for severe pain. (Patient not taking: Reported on 02/19/2015), Disp: 50 tablet, Rfl: 0 .  ramipril (ALTACE) 5 MG capsule, Take 1 capsule (5 mg total) by mouth daily., Disp: 90 capsule, Rfl:  3 .  simvastatin (ZOCOR) 40 MG tablet, TAKE 1 BY MOUTH DAILY, Disp: 90 tablet, Rfl: 3  Past Medical History: Past Medical History  Diagnosis Date  . Essential hypertension   . Hyperlipidemia   . Coronary artery disease     a. 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  8 stents;  b. 10/2003 Cath [facility]: LM 20d, RI 40p, LAD 30p, 41m patent stent, D1 50p, LCX 30p, patent mid stent, 90d/diffuse, RCA dominant, 30p, patent stent, 451m, EF 44%-->Med Rx.  . [place]  . Mild sleep apnea   . Hypothyroidism   . [place]    "[place] MI"   . Heart murmur     found during office visit  . Chronic kidney disease   . Asthma     pt. reports that he was treated for pnueumonia as a young child only    Tobacco Use: History  Smoking status  . Never Smoker   Smokeless tobacco  . Never Used    Labs: Recent Review Flowsheet Data    Labs for ITP Cardiac and Pulmonary Rehab Latest Ref Rng 01/29/2015 01/29/2015 01/29/2015 01/29/2015 01/30/2015   PHART 7.350 - 7.450 7.431 7.397 7.355 - -   PCO2ART 35.0 - 45.0 mmHg 35.9 40.5 42.0 - -   HCO3 20.0 - 24.0 mEq/L 24.2(H) 25.1(H) 23.5 - -  TCO2 0 - 100 mmol/L 25 26 25 24 24    ACIDBASEDEF 0.0 - 2.0 mmol/L - - 2.0 - -   O2SAT - 99.0 99.0 98.0 - -       Exercise Target Goals:    Exercise Program Goal: Individual exercise prescription set with THRR, safety & activity barriers. Participant demonstrates ability to understand and report RPE using BORG scale, to self-measure pulse accurately, and to acknowledge the importance of the exercise prescription.  Exercise Prescription Goal: Starting with aerobic activity 30 plus minutes a day, 3 days per week for initial exercise prescription. Provide home exercise prescription and guidelines that participant acknowledges understanding prior to discharge.  Activity Barriers & Risk Stratification:     Activity Barriers & Cardiac Risk Stratification - 03/08/15 1501    Activity  Barriers & Cardiac Risk Stratification   Activity Barriers None   Cardiac Risk Stratification High      6 Minute Walk:     6 Minute Walk      03/08/15 1411       6 Minute Walk   Phase Initial     Distance 1700 feet     Walk Time 6 minutes     # of Rest Breaks 0     MPH 3.2     RPE 8     Resting HR 78 bpm     Resting BP 128/80 mmHg     Max Ex. HR 110 bpm     Max Ex. BP 122/68 mmHg     Pre/Post BP   Pre/Post BP? --        Initial Exercise Prescription:     Initial Exercise Prescription - 03/08/15 1400    Date of Initial Exercise Prescription   Date 03/08/15   Treadmill   MPH 3   Grade 0   Minutes 20   Bike   Level 0.4   Watts 15   Minutes 20   Recumbant Bike   Level 6   RPM 50   Watts 50   Minutes 20   NuStep   Level 4   Watts 55   Minutes 20   Arm Ergometer   Level 1   Watts 10   Minutes 15   Arm/Foot Ergometer   Level 4   Watts 15   Minutes 15   Cybex   Level 4   RPM 60   Minutes 20   Recumbant Elliptical   Level 2   RPM 40   Watts 20   Minutes 20   Elliptical   Level 1   Speed 3   Minutes 5   REL-XR   Level 3   Watts 50   Minutes 20   T5 Nustep   Level 3   Watts 30   Minutes 20   Biostep-RELP   Level 5   Watts 40   Minutes 20   Prescription Details   Frequency (times per week) 3   Duration Progress to 50 minutes of aerobic without signs/symptoms of physical distress   Intensity   THRR REST +  30   Ratings of Perceived Exertion 11-15   Progression Continue progressive overload as per policy without signs/symptoms or physical distress.   Resistance Training   Training Prescription Yes   Weight 4   Reps 10-15      Exercise Prescription Changes:     Exercise Prescription Changes      03/17/15 0900  Response to Exercise   Symptoms None       Comments First day of exercise! Patient was oriented to the gym and the equipment functions and settings. Procedures and policies of the gym were outlined and  explained. The patient's individual exercise prescription and treatment plan were reviewed with them. All starting workloads were established based on the results of the functional testing  done at the initial intake visit. The plan for exercise progression was also introduced and progression will be customized based on the patient's performance and goals.        Duration Progress to 30 minutes of continuous aerobic without signs/symptoms of physical distress       Intensity Rest + 30       Progression Continue progressive overload as per policy without signs/symptoms or physical distress.       Resistance Training   Training Prescription Yes       Weight 4       Reps 10-15       Treadmill   MPH 2       Grade 0       Minutes 20          Discharge Exercise Prescription (Final Exercise Prescription Changes):     Exercise Prescription Changes - 03/17/15 0900    Response to Exercise   Symptoms None   Comments First day of exercise! Patient was oriented to the gym and the equipment functions and settings. Procedures and policies of the gym were outlined and explained. The patient's individual exercise prescription and treatment plan were reviewed with them. All starting workloads were established based on the results of the functional testing  done at the initial intake visit. The plan for exercise progression was also introduced and progression will be customized based on the patient's performance and goals.    Duration Progress to 30 minutes of continuous aerobic without signs/symptoms of physical distress   Intensity Rest + 30   Progression Continue progressive overload as per policy without signs/symptoms or physical distress.   Resistance Training   Training Prescription Yes   Weight 4   Reps 10-15   Treadmill   MPH 2   Grade 0   Minutes 20      Nutrition:  Target Goals: Understanding of nutrition guidelines, daily intake of sodium <1534m, cholesterol <2019m calories 30% from  fat and 7% or less from saturated fats, daily to have 5 or more servings of fruits and vegetables.  Biometrics:     Pre Biometrics - 03/08/15 1410    Pre Biometrics   Height 5' 7.6" (1.717 m)   Weight 195 lb 9.6 oz (88.724 kg)   Waist Circumference 40.5 inches   Hip Circumference 38.25 inches   Waist to Hip Ratio 1.06 %   BMI (Calculated) 30.2       Nutrition Therapy Plan and Nutrition Goals:     Nutrition Therapy & Goals - 03/08/15 1505    Nutrition Therapy   Drug/Food Interactions Statins/Certain Fruits   Personal Nutrition Goals   Personal Goal #1 Decrease sodium. ([name] said he has already met with a Registered Dietician that Dr. GoRockey Siturranged so he feels he doesn't need to meet individually with our Cardiac Rehab Registered Dietician. )   Personal Goal #2 Limit saturated fats.   Personal Goal #3 Eat fruit with the skin on it ie peaches, apples but nothing in cans is what he previous dietician taught [name] he reprots.    Intervention Plan  Intervention Using nutrition plan and personal goals to gain a healthy nutrition lifestyle. Add exercise as prescribed.      Nutrition Discharge: Rate Your Plate Scores:   Nutrition Goals Re-Evaluation:   Psychosocial: Target Goals: Acknowledge presence or absence of depression, maximize coping skills, provide positive support system. Participant is able to verbalize types and ability to use techniques and skills needed for reducing stress and depression.  Initial Review & Psychosocial Screening:     Initial Psych Review & Screening - 03/08/15 [place]? Yes   Comments [name] has to pay a $3000 deductible for the new 2017 so he said he may not attend all 36 sessions since each session will be an out of pocket expense.    Barriers   Psychosocial barriers to participate in program There are no identifiable barriers or psychosocial needs.   Screening Interventions   Interventions Encouraged to  exercise      Quality of Life Scores:     Quality of Life - 03/08/15 1539    Quality of Life Scores   Health/Function Pre 26 %   Socioeconomic Pre 27.86 %   Psych/Spiritual Pre 28.29 %   Family Pre 30 %   GLOBAL Pre 27.44 %      PHQ-9:     Recent Review Flowsheet Data    Depression screen [facility] 2/9 03/08/2015   Decreased Interest 0   Down, Depressed, Hopeless 0   PHQ - 2 Score 0   Altered sleeping 2   Tired, decreased energy 0   Change in appetite 0   Feeling bad or failure about yourself  0   Trouble concentrating 0   Moving slowly or fidgety/restless 0   PHQ-9 Score 2   Difficult doing work/chores Not difficult at all      Psychosocial Evaluation and Intervention:   Psychosocial Re-Evaluation:   Vocational Rehabilitation: Provide vocational rehab assistance to qualifying candidates.   Vocational Rehab Evaluation & Intervention:     Vocational Rehab - 03/08/15 1503    Initial Vocational Rehab Evaluation & Intervention   Assessment shows need for Vocational Rehabilitation No      Education: Education Goals: Education classes will be provided on a weekly basis, covering required topics. Participant will state understanding/return demonstration of topics presented.  Learning Barriers/Preferences:     Learning Barriers/Preferences - 03/08/15 1502    Learning Barriers/Preferences   Learning Barriers None   Learning Preferences None      Education Topics: General Nutrition Guidelines/Fats and Fiber: -Group instruction provided by verbal, written material, models and posters to present the general guidelines for heart healthy nutrition. Gives an explanation and review of dietary fats and fiber.   Controlling Sodium/Reading Food Labels: -Group verbal and written material supporting the discussion of sodium use in heart healthy nutrition. Review and explanation with models, verbal and written materials for utilization of the food label.   Exercise  Physiology & Risk Factors: - Group verbal and written instruction with models to review the exercise physiology of the cardiovascular system and associated critical values. Details cardiovascular disease risk factors and the goals associated with each risk factor.   Aerobic Exercise & Resistance Training: - Gives group verbal and written discussion on the health impact of inactivity. On the components of aerobic and resistive training programs and the benefits of this training and how to safely progress through these programs.   Flexibility, Balance, General Exercise Guidelines: - Provides group verbal and  written instruction on the benefits of flexibility and balance training programs. Provides general exercise guidelines with specific guidelines to those with heart or lung disease. Demonstration and skill practice provided.   Stress Management: - Provides group verbal and written instruction about the health risks of elevated stress, cause of high stress, and healthy ways to reduce stress.   Depression: - Provides group verbal and written instruction on the correlation between heart/lung disease and depressed mood, treatment options, and the stigmas associated with seeking treatment.   Anatomy & Physiology of the Heart: - Group verbal and written instruction and models provide basic cardiac anatomy and physiology, with the coronary electrical and arterial systems. Review of: AMI, Angina, Valve disease, Heart Failure, Cardiac Arrhythmia, Pacemakers, and the ICD.   Cardiac Procedures: - Group verbal and written instruction and models to describe the testing methods done to diagnose heart disease. Reviews the outcomes of the test results. Describes the treatment choices: Medical Management, Angioplasty, or Coronary Bypass Surgery.   Cardiac Medications: - Group verbal and written instruction to review commonly prescribed medications for heart disease. Reviews the medication, class of the  drug, and side effects. Includes the steps to properly store meds and maintain the prescription regimen.   Go Sex-Intimacy & Heart Disease, Get SMART - Goal Setting: - Group verbal and written instruction through game format to discuss heart disease and the return to sexual intimacy. Provides group verbal and written material to discuss and apply goal setting through the application of the S.M.A.R.T. Method.   Other Matters of the Heart: - Provides group verbal, written materials and models to describe Heart Failure, Angina, Valve Disease, and Diabetes in the realm of heart disease. Includes description of the disease process and treatment options available to the cardiac patient.   Exercise & Equipment Safety: - Individual verbal instruction and demonstration of equipment use and safety with use of the equipment.          Cardiac Rehab from 03/08/2015 in [facility] Cardiac and Pulmonary Rehab   Date  03/08/15   Educator  C. EnterkinRN   Instruction Review Code  1- partially meets, needs review/practice      Infection Prevention: - Provides verbal and written material to individual with discussion of infection control including proper hand washing and proper equipment cleaning during exercise session.      Cardiac Rehab from 03/08/2015 in [facility] Cardiac and Pulmonary Rehab   Date  03/08/15   Educator  C. EnterkinRN   Instruction Review Code  2- meets goals/outcomes      Falls Prevention: - Provides verbal and written material to individual with discussion of falls prevention and safety.      Cardiac Rehab from 03/08/2015 in [facility] Cardiac and Pulmonary Rehab   Date  03/08/15   Educator  C. [place]   Instruction Review Code  2- meets goals/outcomes      Diabetes: - Individual verbal and written instruction to review signs/symptoms of diabetes, desired ranges of glucose level fasting, after meals and with exercise. Advice that pre and post exercise glucose checks will be done for 3 sessions  at entry of program.      Cardiac Rehab from 03/08/2015 in [facility] Cardiac and Pulmonary Rehab   Date  03/08/15   Educator  Loletha Grayer ENterkinRN   Instruction Review Code  1- partially meets, needs review/practice       Knowledge Questionnaire Score:     Knowledge Questionnaire Score - 03/08/15 1502    Knowledge Questionnaire Score  Pre Score 26      Personal Goals and Risk Factors at Admission:     Personal Goals and Risk Factors at Admission - 03/08/15 1507    Personal Goals and Risk Factors on Admission    Weight Management Yes   Intervention Learn and follow the exercise and diet guidelines while in the program. Utilize the nutrition and education classes to help gain knowledge of the diet and exercise expectations in the program   Admit Weight 195 lb (88.451 kg)   Goal Weight 170 lb (77.111 kg)  1 year ago [name] was 230lbs   Increase Aerobic Exercise and Physical Activity Yes   Intervention While in program, learn and follow the exercise prescription taught. Start at a low level workload and increase workload after able to maintain previous level for 30 minutes. Increase time before increasing intensity.   Take Less Medication Yes   Intervention Learn your risk factors and begin the lifestyle modifications for risk factor control during your time in the program.   Goal Blood glucose control identified by blood glucose values, HgbA1C. Participant verbalizes understanding of the signs/symptoms of hyper/hypo glycemia, proper foot care and importance of medication and nutrition plan for blood glucose control.   Intervention Provide nutrition & aerobic exercise along with prescribed medications to achieve blood glucose in normal ranges: Fasting 65-99 mg/dL   Hypertension Yes   Goal Participant will see blood pressure controlled within the values of 140/82m/Hg or within value directed by their physician.   Intervention Provide nutrition & aerobic exercise along with prescribed medications to  achieve BP 140/90 or less.   Lipids Yes   Goal Cholesterol controlled with medications as prescribed, with individualized exercise RX and with personalized nutrition plan. Value goals: LDL < 771m HDL > 4025mParticipant states understanding of desired cholesterol values and following prescriptions.   Intervention Provide nutrition & aerobic exercise along with prescribed medications to achieve LDL <45m91mDL >40mg35m   Personal Goals and Risk Factors Review:    Personal Goals Discharge (Final Personal Goals and Risk Factors Review):    ITP Comments:     ITP Comments      03/08/15 1511 03/23/15 0837         ITP Comments Decrease sodium. ([name] said he has already met with a Registered Dietician that Dr. GollaRockey Situnged so he feels he doesn't need to meet individually with our Cardiac Rehab Registered DietiMonmouthoe already walks 7 days a week for 60 minutes at a time for exercise. [name] reports he had a heart attack in his 30s a67s has a total of 10 stents. He had open heart surgery x 4 in 2016. [name] showed me where they tried to take a  vein for a graft in his right lower leg and sometimes he has right leg numbness which the MD knows about. [name] reports the numbness is less when he walks.  30 DAy Review. Continue with ITP.  HAs attended one session since orientation         Comments:

## 2015-03-24 DIAGNOSIS — Z951 Presence of aortocoronary bypass graft: Secondary | ICD-10-CM

## 2015-03-24 LAB — GLUCOSE, CAPILLARY: GLUCOSE-CAPILLARY: 130 mg/dL — AB (ref 65–99)

## 2015-03-24 NOTE — Progress Notes (Signed)
Daily Session Note  Patient Details  Name: Otilio Groleau MRN: [id number] Date of Birth: 09-Feb-1955 Referring Provider:  Maury Dus, MD  Encounter Date: 03/24/2015  Check In:     Session Check In - 03/24/15 0817    Check-In   Location [facility]-Cardiac & Pulmonary Rehab   Staff Present Heath Lark, RN, BSN, CCRP;Renee Dillard Essex, MS, ACSM CEP, Exercise Physiologist;Journee Bobrowski, BS, ACSM EP-C, Exercise Physiologist   Supervising physician immediately available to respond to emergencies See telemetry face sheet for immediately available ER MD   Medication changes reported     No   Fall or balance concerns reported    No   Warm-up and Cool-down Performed on first and last piece of equipment   Resistance Training Performed No   VAD Patient? No   Pain Assessment   Currently in Pain? No/denies         Goals Met:  Proper associated with RPD/PD & O2 Sat Exercise tolerated well No report of cardiac concerns or symptoms Strength training completed today  Goals Unmet:  Not Applicable  Goals Comments:    Dr. Emily Filbert is Medical Director for [facility].

## 2015-03-31 ENCOUNTER — Encounter: Payer: BLUE CROSS/BLUE SHIELD | Attending: Cardiovascular Disease

## 2015-03-31 ENCOUNTER — Encounter: Payer: Self-pay | Admitting: Cardiothoracic Surgery

## 2015-03-31 ENCOUNTER — Ambulatory Visit (INDEPENDENT_AMBULATORY_CARE_PROVIDER_SITE_OTHER): Payer: Self-pay | Admitting: Cardiothoracic Surgery

## 2015-03-31 VITALS — BP 140/90 | HR 76 | Resp 20 | Ht 68.0 in | Wt 195.0 lb

## 2015-03-31 DIAGNOSIS — Z951 Presence of aortocoronary bypass graft: Secondary | ICD-10-CM | POA: Insufficient documentation

## 2015-03-31 DIAGNOSIS — I25119 Atherosclerotic heart disease of native coronary artery with unspecified angina pectoris: Secondary | ICD-10-CM

## 2015-03-31 NOTE — Addendum Note (Signed)
Addended by: Lynford Humphrey on: 03/31/2015 07:11 AM   Modules accepted: Orders

## 2015-03-31 NOTE — Progress Notes (Signed)
PCP is Juan Austria, MD Referring Provider is Minna Merritts, MD  Chief Complaint  Patient presents with  . Routine Post Op    6 week f/u     HPI:60 year old severe diabetic 2 months after urgent CABG He's had some atypical chest pain consistent with musculoskeletal or neuritic pain 0n  isolated occasions but no symptoms of angina or CHF.he is attending cardiac rehabilitation and has returned back to work as well.  There is a nonhealing chest tube site with eschar which was removed and the wound was cleaned with peroxide and saline and silver nitrate was applied  There is a slow to heal right leg incision at the right saphenous vein harvest site with eschar and some mild erythema for which he be treated with oral doxycycline 100 mg twice a day x10 days   Past Medical History  Diagnosis Date  . Essential hypertension   . Hyperlipidemia   . Coronary artery disease     a. Dating back to 38 w/ h/o MI.  8 stents;  b. 10/2003 Cath [facility]: LM 20d, RI 40p, LAD 30p, 82m, patent stent, D1 50p, LCX 30p, patent mid stent, 90d/diffuse, RCA dominant, 30p, patent stent, 60m/d, EF 44%-->Med Rx.  . Type II diabetes mellitus ([place])   . Mild sleep apnea   . Hypothyroidism   . Myocardial infarction ([place]) 1996    "small MI"   . Heart murmur     found during office visit  . Chronic kidney disease   . Asthma     pt. reports that he was treated for pnueumonia as a young child only    Past Surgical History  Procedure Laterality Date  . Thyroidectomy    . Cardiac catheterization  1993    [place]; Dr.Harold; [place]; Dr. Joneen Boers; [employer]  . Cardiac catheterization  1996    ""  . Cardiac catheterization      ''''  . Cardiac catheterization      '''  . Cardiac catheterization      '''  . Cardiac catheterization  2002    '''  . Cardiac catheterization  2006    [facility]; [place]   . Cyst excision      right- wrist-  ganglion   . Nasal septum surgery    . Cardiac stent      x 10  . Cardiac catheterization N/A 01/21/2015    Procedure: Left Heart Cath and Coronary Angiography;  Surgeon: Wellington Hampshire, MD;  Location: [facility];  Service: Cardiovascular;  Laterality: N/A;  . Coronary artery bypass graft N/A 01/29/2015    Procedure: CORONARY ARTERY BYPASS GRAFTING (CABG)x four using left internal mammary artery artery and bilateral saphenous thigh vein using endoscope.;  Surgeon: Ivin Poot, MD;  Location: [facility];  Service: Open Heart Surgery;  Laterality: N/A;  . Tee without cardioversion N/A 01/29/2015    Procedure: TRANSESOPHAGEAL ECHOCARDIOGRAM (TEE);  Surgeon: Ivin Poot, MD;  Location: [facility];  Service: Open Heart Surgery;  Laterality: N/A;    Family History  Problem Relation Age of Onset  . Heart attack Mother   . Heart attack Father   . Cancer Father 21    early prostate cancer  . Drug abuse Sister   . Cirrhosis Sister     Social History Social History  Substance Use Topics  . Smoking status: Never Smoker   . Smokeless tobacco: Never Used  .  Alcohol Use: 0.5 oz/week    1 Standard drinks or equivalent per week     Comment: occassional  ( once per week) beer & sometimes alcohol    Current Outpatient Prescriptions  Medication Sig Dispense Refill  . aspirin 81 MG tablet Take 81 mg by mouth daily.    . clopidogrel (PLAVIX) 75 MG tablet Take 1 tablet (75 mg total) by mouth daily. 30 tablet 3  . folic acid (FOLVITE) A999333 MCG tablet Take 400 mcg by mouth daily.    . Insulin Glargine (LANTUS) 100 UNIT/ML Solostar Pen Inject 24 Units into the skin daily at 10 pm. 15 mL 11  . levothyroxine (SYNTHROID) 100 MCG tablet Take 1 tablet (100 mcg total) by mouth daily before breakfast. 90 tablet 0  . metFORMIN (GLUCOPHAGE-XR) 500 MG 24 hr tablet Take 1 tablet (500 mg total) by mouth 2 (two) times daily with a meal. 60 tablet 1  . metoprolol tartrate (LOPRESSOR) 50 MG tablet Take 1  tablet (50 mg total) by mouth 2 (two) times daily. 180 tablet 3  . Multiple Vitamin (MULTIVITAMIN) tablet Take 1 tablet by mouth 2 (two) times daily.     . nitroGLYCERIN (NITROSTAT) 0.4 MG SL tablet Place 0.4 mg under the tongue every 5 (five) minutes as needed for chest pain.    . Omega-3 Fatty Acids (FISH OIL PO) Take 1,800 mg by mouth daily.     . ramipril (ALTACE) 5 MG capsule Take 1 capsule (5 mg total) by mouth daily. 90 capsule 3  . simvastatin (ZOCOR) 40 MG tablet TAKE 1 BY MOUTH DAILY 90 tablet 3   No current facility-administered medications for this visit.    No Known Allergies  Review of Systems  Walking over a mile a day Appetite improving overall strength improved No problems with sternal incision  BP 140/90 mmHg  Pulse 76  Resp 20  Ht 5\' 8"  (1.727 m)  Wt 195 lb (88.451 kg)  BMI 29.66 kg/m2  SpO2 98% Physical Exam Alert and comfortable Lungs clear Stable sternal well-healed Heart rhythm regular without gallop or murmur Chest tube site debrided and cleaned as above No peripheral edema Shallow eschar with mild erythema right lower leg from vein harvest incision. No drainage or fluctuance  Diagnostic Tests: none  Impression: Patient progressing well after CABG His diabetes has resulted in some superficial wound healing problems which should improve with time  Plan: Return in 4 weeks for wound check if necessary. He understands not to lift more than 20 pounds until 3 months after surgery.  Len Childs, MD [facility] [phone]

## 2015-04-02 ENCOUNTER — Encounter: Payer: BLUE CROSS/BLUE SHIELD | Admitting: *Deleted

## 2015-04-02 DIAGNOSIS — Z951 Presence of aortocoronary bypass graft: Secondary | ICD-10-CM | POA: Diagnosis present

## 2015-04-02 LAB — GLUCOSE, CAPILLARY: Glucose-Capillary: 160 mg/dL — ABNORMAL HIGH (ref 65–99)

## 2015-04-02 NOTE — Progress Notes (Signed)
Cardiac Individual Treatment Plan  Patient Details  Name: Juan [name] MRN: [id number] Date of Birth: 18-Sep-1955 Referring Provider:  Minna Merritts, MD  Initial Encounter Date:    Visit Diagnosis: S/P CABG x 4  Patient's Home Medications on Admission:  Current outpatient prescriptions:  .  aspirin 81 MG tablet, Take 81 mg by mouth daily., Disp: , Rfl:  .  clopidogrel (PLAVIX) 75 MG tablet, Take 1 tablet (75 mg total) by mouth daily., Disp: 30 tablet, Rfl: 3 .  folic acid (FOLVITE) 335 MCG tablet, Take 400 mcg by mouth daily., Disp: , Rfl:  .  Insulin Glargine (LANTUS) 100 UNIT/ML Solostar Pen, Inject 24 Units into the skin daily at 10 pm., Disp: 15 mL, Rfl: 11 .  levothyroxine (SYNTHROID) 100 MCG tablet, Take 1 tablet (100 mcg total) by mouth daily before breakfast., Disp: 90 tablet, Rfl: 0 .  metFORMIN (GLUCOPHAGE-XR) 500 MG 24 hr tablet, Take 1 tablet (500 mg total) by mouth 2 (two) times daily with a meal., Disp: 60 tablet, Rfl: 1 .  metoprolol tartrate (LOPRESSOR) 50 MG tablet, Take 1 tablet (50 mg total) by mouth 2 (two) times daily., Disp: 180 tablet, Rfl: 3 .  Multiple Vitamin (MULTIVITAMIN) tablet, Take 1 tablet by mouth 2 (two) times daily. , Disp: , Rfl:  .  nitroGLYCERIN (NITROSTAT) 0.4 MG SL tablet, Place 0.4 mg under the tongue every 5 (five) minutes as needed for chest pain., Disp: , Rfl:  .  Omega-3 Fatty Acids (FISH OIL PO), Take 1,800 mg by mouth daily. , Disp: , Rfl:  .  ramipril (ALTACE) 5 MG capsule, Take 1 capsule (5 mg total) by mouth daily., Disp: 90 capsule, Rfl: 3 .  simvastatin (ZOCOR) 40 MG tablet, TAKE 1 BY MOUTH DAILY, Disp: 90 tablet, Rfl: 3  Past Medical History: Past Medical History  Diagnosis Date  . Essential hypertension   . Hyperlipidemia   . Coronary artery disease     a. Dating back to 73 w/ h/o MI.  8 stents;  b. 10/2003 Cath [facility]: LM 20d, RI 40p, LAD 30p, 34m patent stent, D1 50p, LCX 30p, patent mid stent, 90d/diffuse, RCA dominant,  30p, patent stent, 453m, EF 44%-->Med Rx.  . [place] [place]  . Mild sleep apnea   . Hypothyroidism   . Myocardial infarction ([facility]1996    "small MI"   . Heart murmur     found during office visit  . Chronic kidney disease   . Asthma     pt. reports that he was treated for pnueumonia as a young child only    Tobacco Use: History  Smoking status  . Never Smoker   Smokeless tobacco  . Never Used    Labs: Recent Review Flowsheet Data    Labs for ITP Cardiac and Pulmonary Rehab Latest Ref Rng 01/29/2015 01/29/2015 01/29/2015 01/29/2015 01/30/2015   PHART 7.350 - 7.450 7.431 7.397 7.355 - -   PCO2ART 35.0 - 45.0 mmHg 35.9 40.5 42.0 - -   HCO3 20.0 - 24.0 mEq/L 24.2(H) 25.1(H) 23.5 - -   TCO2 0 - 100 mmol/L 25 26 25 24 24    ACIDBASEDEF 0.0 - 2.0 mmol/L - - 2.0 - -   O2SAT - 99.0 99.0 98.0 - -       Exercise Target Goals:    Exercise Program Goal: Individual exercise prescription set with THRR, safety & activity barriers. Participant demonstrates ability to understand and report RPE using BORG scale, to self-measure  pulse accurately, and to acknowledge the importance of the exercise prescription.  Exercise Prescription Goal: Starting with aerobic activity 30 plus minutes a day, 3 days per week for initial exercise prescription. Provide home exercise prescription and guidelines that participant acknowledges understanding prior to discharge.  Activity Barriers & Risk Stratification:     Activity Barriers & Cardiac Risk Stratification - 03/08/15 1501    Activity Barriers & Cardiac Risk Stratification   Activity Barriers None   Cardiac Risk Stratification High      6 Minute Walk:     6 Minute Walk      03/08/15 1411       6 Minute Walk   Phase Initial     Distance 1700 feet     Walk Time 6 minutes     # of Rest Breaks 0     MPH 3.2     RPE 8     Resting HR 78 bpm     Resting BP 128/80 mmHg     Max Ex. HR 110 bpm     Max Ex. BP 122/68 mmHg      Pre/Post BP   Pre/Post BP? --        Initial Exercise Prescription:     Initial Exercise Prescription - 03/08/15 1400    Date of Initial Exercise Prescription   Date 03/08/15   Treadmill   MPH 3   Grade 0   Minutes 20   Bike   Level 0.4   Watts 15   Minutes 20   Recumbant Bike   Level 6   RPM 50   Watts 50   Minutes 20   NuStep   Level 4   Watts 55   Minutes 20   Arm Ergometer   Level 1   Watts 10   Minutes 15   Arm/Foot Ergometer   Level 4   Watts 15   Minutes 15   Cybex   Level 4   RPM 60   Minutes 20   Recumbant Elliptical   Level 2   RPM 40   Watts 20   Minutes 20   Elliptical   Level 1   Speed 3   Minutes 5   REL-XR   Level 3   Watts 50   Minutes 20   T5 Nustep   Level 3   Watts 30   Minutes 20   Biostep-RELP   Level 5   Watts 40   Minutes 20   Prescription Details   Frequency (times per week) 3   Duration Progress to 50 minutes of aerobic without signs/symptoms of physical distress   Intensity   THRR REST +  30   Ratings of Perceived Exertion 11-15   Progression   Progression Continue progressive overload as per policy without signs/symptoms or physical distress.   Resistance Training   Training Prescription Yes   Weight 4   Reps 10-15      Exercise Prescription Changes:     Exercise Prescription Changes      03/17/15 0900 04/02/15 0900         Response to Exercise   Symptoms None None      Comments First day of exercise! Patient was oriented to the gym and the equipment functions and settings. Procedures and policies of the gym were outlined and explained. The patient's individual exercise prescription and treatment plan were reviewed with them. All starting workloads were established based on the results of the functional testing  done at the initial intake visit. The plan for exercise progression was also introduced and progression will be customized based on the patient's performance and goals.  First day of exercise!  Patient was oriented to the gym and the equipment functions and settings. Procedures and policies of the gym were outlined and explained. The patient's individual exercise prescription and treatment plan were reviewed with them. All starting workloads were established based on the results of the functional testing  done at the initial intake visit. The plan for exercise progression was also introduced and progression will be customized based on the patient's performance and goals.       Duration Progress to 30 minutes of continuous aerobic without signs/symptoms of physical distress Progress to 30 minutes of continuous aerobic without signs/symptoms of physical distress      Intensity Rest + 30 Rest + 30      Progression   Progression Continue progressive overload as per policy without signs/symptoms or physical distress. Continue progressive overload as per policy without signs/symptoms or physical distress.      Resistance Training   Weight  5      Training Prescription (read-only) Yes       Weight (read-only) 4       Reps (read-only) 10-15       Treadmill   MPH  3.5      Grade  0      Minutes  38      MPH (read-only) 2       Grade (read-only) 0       Minutes (read-only) 20          Discharge Exercise Prescription (Final Exercise Prescription Changes):     Exercise Prescription Changes - 04/02/15 0900    Response to Exercise   Symptoms None   Comments First day of exercise! Patient was oriented to the gym and the equipment functions and settings. Procedures and policies of the gym were outlined and explained. The patient's individual exercise prescription and treatment plan were reviewed with them. All starting workloads were established based on the results of the functional testing  done at the initial intake visit. The plan for exercise progression was also introduced and progression will be customized based on the patient's performance and goals.    Duration Progress to 30 minutes of  continuous aerobic without signs/symptoms of physical distress   Intensity Rest + 30   Progression   Progression Continue progressive overload as per policy without signs/symptoms or physical distress.   Resistance Training   Weight 5   Treadmill   MPH 3.5   Grade 0   Minutes 30      Nutrition:  Target Goals: Understanding of nutrition guidelines, daily intake of sodium <1563m, cholesterol <2036m calories 30% from fat and 7% or less from saturated fats, daily to have 5 or more servings of fruits and vegetables.  Biometrics:     Pre Biometrics - 03/08/15 1410    Pre Biometrics   Height 5' 7.6" (1.717 m)   Weight 195 lb 9.6 oz (88.724 kg)   Waist Circumference 40.5 inches   Hip Circumference 38.25 inches   Waist to Hip Ratio 1.06 %   BMI (Calculated) 30.2       Nutrition Therapy Plan and Nutrition Goals:     Nutrition Therapy & Goals - 03/08/15 1505    Nutrition Therapy   Drug/Food Interactions Statins/Certain Fruits   Personal Nutrition Goals   Personal Goal #1 Decrease sodium. ([name] said  he has already met with a Registered Dietician that Dr. Rockey Situ arranged so he feels he doesn't need to meet individually with our Cardiac Rehab Registered Dietician. )   Personal Goal #2 Limit saturated fats.   Personal Goal #3 Eat fruit with the skin on it ie peaches, apples but nothing in cans is what he previous dietician taught [name] he reprots.    Intervention Plan   Intervention (read-only) Using nutrition plan and personal goals to gain a healthy nutrition lifestyle. Add exercise as prescribed.      Nutrition Discharge: Rate Your Plate Scores:   Nutrition Goals Re-Evaluation:     Nutrition Goals Re-Evaluation      04/02/15 0908           Personal Goal #1 Re-Evaluation   Goal Progress Seen Yes       Comments Jospeh said again today he doesn't feel like he needs to meet individually with a registered dietician since he knows what to do.        Personal Goal #2  Re-Evaluation   Goal Progress Seen Yes       Personal Goal #3 Re-Evaluation   Goal Progress Seen Yes          Psychosocial: Target Goals: Acknowledge presence or absence of depression, maximize coping skills, provide positive support system. Participant is able to verbalize types and ability to use techniques and skills needed for reducing stress and depression.  Initial Review & Psychosocial Screening:     Initial Psych Review & Screening - 03/08/15 [place]? Yes   Comments [name] has to pay a $3000 deductible for the new 2017 so he said he may not attend all 36 sessions since each session will be an out of pocket expense.    Barriers   Psychosocial barriers to participate in program There are no identifiable barriers or psychosocial needs.   Screening Interventions   Interventions Encouraged to exercise      Quality of Life Scores:     Quality of Life - 03/08/15 1539    Quality of Life Scores   Health/Function Pre 26 %   Socioeconomic Pre 27.86 %   Psych/Spiritual Pre 28.29 %   Family Pre 30 %   GLOBAL Pre 27.44 %      PHQ-9:     Recent Review Flowsheet Data    Depression screen [facility] 2/9 03/08/2015   Decreased Interest 0   Down, Depressed, Hopeless 0   PHQ - 2 Score 0   Altered sleeping 2   Tired, decreased energy 0   Change in appetite 0   Feeling bad or failure about yourself  0   Trouble concentrating 0   Moving slowly or fidgety/restless 0   PHQ-9 Score 2   Difficult doing work/chores Not difficult at all      Psychosocial Evaluation and Intervention:   Psychosocial Re-Evaluation:     Psychosocial Re-Evaluation      04/02/15 0910           Psychosocial Re-Evaluation   Interventions Encouraged to attend Cardiac Rehabilitation for the exercise       Comments [name] said he feel everything is going well in Cardiac Rehab for him and he has been able to attend frequently.           Vocational  Rehabilitation: Provide vocational rehab assistance to qualifying candidates.   Vocational Rehab Evaluation & Intervention:     Vocational Rehab -  03/08/15 1503    Initial Vocational Rehab Evaluation & Intervention   Assessment shows need for Vocational Rehabilitation No      Education: Education Goals: Education classes will be provided on a weekly basis, covering required topics. Participant will state understanding/return demonstration of topics presented.  Learning Barriers/Preferences:     Learning Barriers/Preferences - 03/08/15 1502    Learning Barriers/Preferences   Learning Barriers None   Learning Preferences None      Education Topics: General Nutrition Guidelines/Fats and Fiber: -Group instruction provided by verbal, written material, models and posters to present the general guidelines for heart healthy nutrition. Gives an explanation and review of dietary fats and fiber.   Controlling Sodium/Reading Food Labels: -Group verbal and written material supporting the discussion of sodium use in heart healthy nutrition. Review and explanation with models, verbal and written materials for utilization of the food label.   Exercise Physiology & Risk Factors: - Group verbal and written instruction with models to review the exercise physiology of the cardiovascular system and associated critical values. Details cardiovascular disease risk factors and the goals associated with each risk factor.   Aerobic Exercise & Resistance Training: - Gives group verbal and written discussion on the health impact of inactivity. On the components of aerobic and resistive training programs and the benefits of this training and how to safely progress through these programs.   Flexibility, Balance, General Exercise Guidelines: - Provides group verbal and written instruction on the benefits of flexibility and balance training programs. Provides general exercise guidelines with specific  guidelines to those with heart or lung disease. Demonstration and skill practice provided.   Stress Management: - Provides group verbal and written instruction about the health risks of elevated stress, cause of high stress, and healthy ways to reduce stress.   Depression: - Provides group verbal and written instruction on the correlation between heart/lung disease and depressed mood, treatment options, and the stigmas associated with seeking treatment.          Cardiac Rehab from 03/24/2015 in [facility] Cardiac and Pulmonary Rehab   Date  03/24/15   Educator  [facility]   Instruction Review Code  2- meets goals/outcomes      Anatomy & Physiology of the Heart: - Group verbal and written instruction and models provide basic cardiac anatomy and physiology, with the coronary electrical and arterial systems. Review of: AMI, Angina, Valve disease, Heart Failure, Cardiac Arrhythmia, Pacemakers, and the ICD.   Cardiac Procedures: - Group verbal and written instruction and models to describe the testing methods done to diagnose heart disease. Reviews the outcomes of the test results. Describes the treatment choices: Medical Management, Angioplasty, or Coronary Bypass Surgery.   Cardiac Medications: - Group verbal and written instruction to review commonly prescribed medications for heart disease. Reviews the medication, class of the drug, and side effects. Includes the steps to properly store meds and maintain the prescription regimen.   Go Sex-Intimacy & Heart Disease, Get SMART - Goal Setting: - Group verbal and written instruction through game format to discuss heart disease and the return to sexual intimacy. Provides group verbal and written material to discuss and apply goal setting through the application of the S.M.A.R.T. Method.   Other Matters of the Heart: - Provides group verbal, written materials and models to describe Heart Failure, Angina, Valve Disease, and Diabetes in the realm of heart  disease. Includes description of the disease process and treatment options available to the cardiac patient.   Exercise & Equipment Safety: -  Individual verbal instruction and demonstration of equipment use and safety with use of the equipment.      Card[facility]/24/2015 [facility] Cardiac and [facility]   Date  03/08/15   Educator  C. EnterkinRN   Instruction Review Code  1- partially meets, needs review/practice      Infection Prevention: - Provides verbal and written material to individual with discussion of infection control including proper hand washing and proper equipment cleaning during exercise session.      Cardiac Rehab from 03/24/2015 in [facility] Cardiac and Pulmonary Rehab   Date  03/08/15   Educator  C. EnterkinRN   Instruction Review Code  2- meets goals/outcomes      Falls Prevention: - Provides verbal and written material to individual with discussion of falls prevention and safety.      Cardiac Rehab from 03/24/2015 in [facility] Cardiac and Pulmonary Rehab   Date  03/08/15   Educator  C. [place]   Instruction Review Code  2- meets goals/outcomes      Diabetes: - Individual verbal and written instruction to review signs/symptoms of diabetes, desired ranges of glucose level fasting, after meals and with exercise. Advice that pre and post exercise glucose checks will be done for 3 sessions at entry of program.      Cardiac Rehab from 03/24/2015 in [facility] Cardiac and Pulmonary Rehab   Date  03/08/15   Educator  Loletha Grayer ENterkinRN   Instruction Review Code  1- partially meets, needs review/practice       Knowledge Questionnaire Score:     Knowledge Questionnaire Score - 03/08/15 1502    Knowledge Questionnaire Score   Pre Score 26      Personal Goals and Risk Factors at Admission:     Personal Goals and Risk Factors at Admission - 03/08/15 1507    Core Components/Risk Factors/Patient Goals on Admission    Weight Management Yes   Intervention (read-only)  Learn and follow the exercise and diet guidelines while in the program. Utilize the nutrition and education classes to help gain knowledge of the diet and exercise expectations in the program   Admit Weight 195 lb (88.451 kg)   Goal Weight: Short Term 170 lb (77.111 kg)  1 year ago [name] was 230lbs   Sedentary Yes   Intervention (read-only) While in program, learn and follow the exercise prescription taught. Start at a low level workload and increase workload after able to maintain previous level for 30 minutes. Increase time before increasing intensity.   Goal Blood glucose control identified by blood glucose values, HgbA1C. Participant verbalizes understanding of the signs/symptoms of hyper/hypo glycemia, proper foot care and importance of medication and nutrition plan for blood glucose control.   Intervention (read-only) Provide nutrition & aerobic exercise along with prescribed medications to achieve blood glucose in normal ranges: Fasting 65-99 mg/dL   Hypertension Yes   Goal Participant will see blood pressure controlled within the values of 140/17m/Hg or within value directed by their physician.   Intervention (read-only) Provide nutrition & aerobic exercise along with prescribed medications to achieve BP 140/90 or less.   Lipids Yes   Goal Cholesterol controlled with medications as prescribed, with individualized exercise RX and with personalized nutrition plan. Value goals: LDL < 765m HDL > 4076mParticipant states understanding of desired cholesterol values and following prescriptions.   Intervention (read-only) Provide nutrition & aerobic exercise along with prescribed medications to achieve LDL <23m39mDL >40mg40mTake Less Medication Yes  Intervention Learn your risk factors and begin the lifestyle modifications for risk factor control during your time in the program.      Personal Goals and Risk Factors Review:      Goals and Risk Factor Review      04/02/15 0909            Core Components/Risk Factors/Patient Goals Review   Personal Goals Review Diabetes;Hypertension;Lipids       Review [name] reports his HBA1c was done yesterday adn uch better down to 6.2 from he reported 11 in the hospital. He reported his cholestrol level yestersay was 133. His blood pressure is 140/80 walking on the treadmill  at 3.47mh.          Personal Goals Discharge (Final Personal Goals and Risk Factors Review):      Goals and Risk Factor Review - 04/02/15 0909    Core Components/Risk Factors/Patient Goals Review   Personal Goals Review Diabetes;Hypertension;Lipids   Review [name] reports his HBA1c was done yesterday adn uch better down to 6.2 from he reported 11 in the hospital. He reported his cholestrol level yestersay was 133. His blood pressure is 140/80 walking on the treadmill  at 3.532m.      ITP Comments:     ITP Comments      03/08/15 1511 03/23/15 0837 04/02/15 0911       ITP Comments Decrease sodium. ([name] said he has already met with a Registered Dietician that Dr. GoRockey Siturranged so he feels he doesn't need to meet individually with our Cardiac Rehab Registered DiKimball) [name] already walks 7 days a week for 60 minutes at a time for exercise. [name] reports he had a heart attack in his 3052snd  has a total of 10 stents. He had open heart surgery x 4 in 2016. [name] showed me where they tried to take a  vein for a graft in his right lower leg and sometimes he has right leg numbness which the MD knows about. [name] reports the numbness is less when he walks.  30 DAy Review. Continue with ITP.  HAs attended one session since orientation [name] reports he had a MI in his 3024snd then 7 heart stents over the years and recently had a CABG. He is able to walk on the treadmill today at 3.47m63m today with no problems.         Comments:

## 2015-04-07 ENCOUNTER — Ambulatory Visit: Payer: BLUE CROSS/BLUE SHIELD | Admitting: Cardiovascular Disease

## 2015-04-14 DIAGNOSIS — Z951 Presence of aortocoronary bypass graft: Secondary | ICD-10-CM

## 2015-04-14 NOTE — Progress Notes (Signed)
Daily Session Note  Patient Details  Name: Juan [name] MRN: [id number] Date of Birth: 18-Mar-1955 Referring Provider:  Minna Merritts, MD  Encounter Date: 04/14/2015  Check In:     Session Check In - 04/14/15 0825    Check-In   Location [facility]-Cardiac & Pulmonary Rehab   Staff Present Heath Lark, RN, BSN, CCRP;Renee Dillard Essex, MS, ACSM CEP, Exercise Physiologist;Iwalani Templeton, BS, ACSM EP-C, Exercise Physiologist   Supervising physician immediately available to respond to emergencies See telemetry face sheet for immediately available ER MD   Medication changes reported     No   Fall or balance concerns reported    No   Warm-up and Cool-down Performed on first and last piece of equipment   Resistance Training Performed No   VAD Patient? No   Pain Assessment   Currently in Pain? No/denies         Goals Met:  Proper associated with RPD/PD & O2 Sat Exercise tolerated well No report of cardiac concerns or symptoms Strength training completed today  Goals Unmet:  Not Applicable  Comments:   Dr. Emily Filbert is Medical Director for [place] and [facility].

## 2015-04-21 DIAGNOSIS — Z951 Presence of aortocoronary bypass graft: Secondary | ICD-10-CM

## 2015-04-21 NOTE — Progress Notes (Signed)
Daily Session Note  Patient Details  Name: Juan [name] MRN: [id number] Date of Birth: 1955/12/22 Referring Provider:  Minna Merritts, MD  Encounter Date: 04/21/2015  Check In:     Session Check In - 04/21/15 0824    Check-In   Location [facility]-Cardiac & Pulmonary Rehab   Staff Present Heath Lark, RN, BSN, CCRP;Renee Dillard [place], MS, ACSM CEP, Exercise Physiologist;Nevae Pinnix, BS, ACSM EP-C, Exercise Physiologist   Supervising physician immediately available to respond to emergencies See telemetry face sheet for immediately available ER MD   Medication changes reported     No   Fall or balance concerns reported    No   Warm-up and Cool-down Performed on first and last piece of equipment   Resistance Training Performed No   VAD Patient? No   Pain Assessment   Currently in Pain? No/denies         Goals Met:  Proper associated with RPD/PD & O2 Sat Exercise tolerated well No report of cardiac concerns or symptoms Strength training completed today  Goals Unmet:  Not Applicable  Comments:    Dr. Emily Filbert is Medical Director for [place] and [facility].

## 2015-04-22 ENCOUNTER — Encounter: Payer: Self-pay | Admitting: *Deleted

## 2015-04-22 DIAGNOSIS — Z951 Presence of aortocoronary bypass graft: Secondary | ICD-10-CM

## 2015-04-22 NOTE — Progress Notes (Signed)
Cardiac Individual Treatment Plan  Patient Details  Name: Juan [name] MRN: [id number] Date of Birth: 02/20/1955 Referring Provider:  Minna Merritts, MD  Initial Encounter Date:       Cardiac Rehab from 03/08/2015 in [facility] Cardiac and Pulmonary Rehab   Date  03/08/15      Visit Diagnosis: S/P CABG x 4  Patient's Home Medications on Admission:  Current outpatient prescriptions:  .  aspirin 81 MG tablet, Take 81 mg by mouth daily., Disp: , Rfl:  .  clopidogrel (PLAVIX) 75 MG tablet, Take 1 tablet (75 mg total) by mouth daily., Disp: 30 tablet, Rfl: 3 .  folic acid (FOLVITE) 409 MCG tablet, Take 400 mcg by mouth daily., Disp: , Rfl:  .  Insulin Glargine (LANTUS) 100 UNIT/ML Solostar Pen, Inject 24 Units into the skin daily at 10 pm., Disp: 15 mL, Rfl: 11 .  levothyroxine (SYNTHROID) 100 MCG tablet, Take 1 tablet (100 mcg total) by mouth daily before breakfast., Disp: 90 tablet, Rfl: 0 .  metFORMIN (GLUCOPHAGE-XR) 500 MG 24 hr tablet, Take 1 tablet (500 mg total) by mouth 2 (two) times daily with a meal., Disp: 60 tablet, Rfl: 1 .  metoprolol tartrate (LOPRESSOR) 50 MG tablet, Take 1 tablet (50 mg total) by mouth 2 (two) times daily., Disp: 180 tablet, Rfl: 3 .  Multiple Vitamin (MULTIVITAMIN) tablet, Take 1 tablet by mouth 2 (two) times daily. , Disp: , Rfl:  .  nitroGLYCERIN (NITROSTAT) 0.4 MG SL tablet, Place 0.4 mg under the tongue every 5 (five) minutes as needed for chest pain., Disp: , Rfl:  .  Omega-3 Fatty Acids (FISH OIL PO), Take 1,800 mg by mouth daily. , Disp: , Rfl:  .  ramipril (ALTACE) 5 MG capsule, Take 1 capsule (5 mg total) by mouth daily., Disp: 90 capsule, Rfl: 3 .  simvastatin (ZOCOR) 40 MG tablet, TAKE 1 BY MOUTH DAILY, Disp: 90 tablet, Rfl: 3  Past Medical History: Past Medical History  Diagnosis Date  . Essential hypertension   . Hyperlipidemia   . Coronary artery disease     a. Dating back to 37 w/ h/o MI.  8 stents;  b. 10/2003 Cath [place]: LM 20d, RI 40p,  LAD 30p, 9m patent stent, D1 50p, LCX 30p, patent mid stent, 90d/diffuse, RCA dominant, 30p, patent stent, 486m, EF 44%-->Med Rx.  . Type II diabetes mellitus ([place]  . Mild sleep apnea   . Hypothyroidism   . [place]    "[place]"   . Heart murmur     found during office visit  . Chronic kidney disease   . Asthma     pt. reports that he was treated for pnueumonia as a young child only    Tobacco Use: History  Smoking status  . Never Smoker   Smokeless tobacco  . Never Used    Labs: Recent Review Flowsheet Data    Labs for ITP Cardiac and Pulmonary Rehab Latest Ref Rng 01/29/2015 01/29/2015 01/29/2015 01/29/2015 01/30/2015   PHART 7.350 - 7.450 7.431 7.397 7.355 - -   PCO2ART 35.0 - 45.0 mmHg 35.9 40.5 42.0 - -   HCO3 20.0 - 24.0 mEq/L 24.2(H) 25.1(H) 23.5 - -   TCO2 0 - 100 mmol/L _0 ACIDBASEDEF 0.0 - 2.0 mmol/L - - 2.0 - -   O2SAT - 99.0 99.0 98.0 - -       Exercise Target Goals:    Exercise Program Goal:  Individual exercise prescription set with THRR, safety & activity barriers. Participant demonstrates ability to understand and report RPE using BORG scale, to self-measure pulse accurately, and to acknowledge the importance of the exercise prescription.  Exercise Prescription Goal: Starting with aerobic activity 30 plus minutes a day, 3 days per week for initial exercise prescription. Provide home exercise prescription and guidelines that participant acknowledges understanding prior to discharge.  Activity Barriers & Risk Stratification:     Activity Barriers & Cardiac Risk Stratification - 03/08/15 1501    Activity Barriers & Cardiac Risk Stratification   Activity Barriers None   Cardiac Risk Stratification High      6 Minute Walk:     6 Minute Walk      03/08/15 1411       6 Minute Walk   Phase Initial     Distance 1700 feet     Walk Time 6 minutes     # of Rest Breaks 0     MPH 3.2     RPE 8     Resting  HR 78 bpm     Resting BP 128/80 mmHg     Max Ex. HR 110 bpm     Max Ex. BP 122/68 mmHg     Pre/Post BP   Pre/Post BP? --        Initial Exercise Prescription:     Initial Exercise Prescription - 03/08/15 1400    Date of Initial Exercise Prescription   Date 03/08/15   Treadmill   MPH 3   Grade 0   Minutes 20   Bike   Level 0.4   Watts 15   Minutes 20   Recumbant Bike   Level 6   RPM 50   Watts 50   Minutes 20   NuStep   Level 4   Watts 55   Minutes 20   Arm Ergometer   Level 1   Watts 10   Minutes 15   Arm/Foot Ergometer   Level 4   Watts 15   Minutes 15   Cybex   Level 4   RPM 60   Minutes 20   Recumbant Elliptical   Level 2   RPM 40   Watts 20   Minutes 20   Elliptical   Level 1   Speed 3   Minutes 5   REL-XR   Level 3   Watts 50   Minutes 20   T5 Nustep   Level 3   Watts 30   Minutes 20   Biostep-RELP   Level 5   Watts 40   Minutes 20   Prescription Details   Frequency (times per week) 3   Duration Progress to 50 minutes of aerobic without signs/symptoms of physical distress   Intensity   THRR REST +  30   Ratings of Perceived Exertion 11-15   Progression   Progression Continue progressive overload as per policy without signs/symptoms or physical distress.   Resistance Training   Training Prescription Yes   Weight 4   Reps 10-15      Perform Capillary Blood Glucose checks as needed.  Exercise Prescription Changes:     Exercise Prescription Changes      03/17/15 0900 04/02/15 0900         Exercise Review   Progression  Yes      Response to Exercise   Blood Pressure (Admit)  142/82 mmHg      Blood Pressure (Exercise)  140/80 mmHg  Blood Pressure (Exit)  112/72 mmHg      Heart Rate (Admit)  79 bpm      Heart Rate (Exercise)  105 bpm      Heart Rate (Exit)  91 bpm      Rating of Perceived Exertion (Exercise)  13      Symptoms None None      Comments First day of exercise! Patient was oriented to the gym and the  equipment functions and settings. Procedures and policies of the gym were outlined and explained. The patient's individual exercise prescription and treatment plan were reviewed with them. All starting workloads were established based on the results of the functional testing  done at the initial intake visit. The plan for exercise progression was also introduced and progression will be customized based on the patient's performance and goals.  Reviewed individualized exercise prescription and made increases per departmental policy. Exercise increases were discussed with the patient and they were able to perform the new work loads without issue (no signs or symptoms).       Duration Progress to 30 minutes of continuous aerobic without signs/symptoms of physical distress Progress to 30 minutes of continuous aerobic without signs/symptoms of physical distress      Intensity Rest + 30 Rest + 30      Progression   Progression Continue progressive overload as per policy without signs/symptoms or physical distress. Continue progressive overload as per policy without signs/symptoms or physical distress.      Resistance Training   Training Prescription  Yes      Weight  5      Reps  10-15      Training Prescription (read-only) Yes       Weight (read-only) 4       Reps (read-only) 10-15       Treadmill   MPH  3.6      Grade  1      Minutes  16      MPH (read-only) 2       Grade (read-only) 0       Minutes (read-only) 20          Exercise Comments:   Discharge Exercise Prescription (Final Exercise Prescription Changes):     Exercise Prescription Changes - 04/02/15 0900    Exercise Review   Progression Yes   Response to Exercise   Blood Pressure (Admit) 142/82 mmHg   Blood Pressure (Exercise) 140/80 mmHg   Blood Pressure (Exit) 112/72 mmHg   Heart Rate (Admit) 79 bpm   Heart Rate (Exercise) 105 bpm   Heart Rate (Exit) 91 bpm   Rating of Perceived Exertion (Exercise) 13   Symptoms None    Comments Reviewed individualized exercise prescription and made increases per departmental policy. Exercise increases were discussed with the patient and they were able to perform the new work loads without issue (no signs or symptoms).    Duration Progress to 30 minutes of continuous aerobic without signs/symptoms of physical distress   Intensity Rest + 30   Progression   Progression Continue progressive overload as per policy without signs/symptoms or physical distress.   Resistance Training   Training Prescription Yes   Weight 5   Reps 10-15   Treadmill   MPH 3.6   Grade 1   Minutes 40      Nutrition:  Target Goals: Understanding of nutrition guidelines, daily intake of sodium <1557m, cholesterol <2065m calories 30% from fat and 7% or less from saturated fats, daily  to have 5 or more servings of fruits and vegetables.  Biometrics:     Pre Biometrics - 03/08/15 1410    Pre Biometrics   Height 5' 7.6" (1.717 m)   Weight 195 lb 9.6 oz (88.724 kg)   Waist Circumference 40.5 inches   Hip Circumference 38.25 inches   Waist to Hip Ratio 1.06 %   BMI (Calculated) 30.2       Nutrition Therapy Plan and Nutrition Goals:     Nutrition Therapy & Goals - 03/08/15 1505    Nutrition Therapy   Drug/Food Interactions Statins/Certain Fruits   Personal Nutrition Goals   Personal Goal #1 Decrease sodium. (Juan [name] said he has already met with a Registered Dietician that Dr. Rockey Situ arranged so he feels he doesn't need to meet individually with our Cardiac Rehab Registered Dietician. )   Personal Goal #2 Limit saturated fats.   Personal Goal #3 Eat fruit with the skin on it ie peaches, apples but nothing in cans is what he previous dietician taught Juan [name] he reprots.    Intervention Plan   Intervention (read-only) Using nutrition plan and personal goals to gain a healthy nutrition lifestyle. Add exercise as prescribed.      Nutrition Discharge: Rate Your Plate Scores:   Nutrition Goals  Re-Evaluation:     Nutrition Goals Re-Evaluation      04/02/15 0908           Personal Goal #1 Re-Evaluation   Goal Progress Seen Yes       Comments Jospeh said again today he doesn't feel like he needs to meet individually with a registered dietician since he knows what to do.        Personal Goal #2 Re-Evaluation   Goal Progress Seen Yes       Personal Goal #3 Re-Evaluation   Goal Progress Seen Yes          Psychosocial: Target Goals: Acknowledge presence or absence of depression, maximize coping skills, provide positive support system. Participant is able to verbalize types and ability to use techniques and skills needed for reducing stress and depression.  Initial Review & Psychosocial Screening:     Initial Psych Review & Screening - 03/08/15 [place]? Yes   Comments Juan [name] has to pay a $3000 deductible for the new 2017 so he said he may not attend all 36 sessions since each session will be an out of pocket expense.    Barriers   Psychosocial barriers to participate in program There are no identifiable barriers or psychosocial needs.   Screening Interventions   Interventions Encouraged to exercise      Quality of Life Scores:     Quality of Life - 03/08/15 1539    Quality of Life Scores   Health/Function Pre 26 %   Socioeconomic Pre 27.86 %   Psych/Spiritual Pre 28.29 %   Family Pre 30 %   GLOBAL Pre 27.44 %      PHQ-9:     Recent Review Flowsheet Data    Depression screen [facility] 2/9 03/08/2015   Decreased Interest 0   Down, Depressed, Hopeless 0   PHQ - 2 Score 0   Altered sleeping 2   Tired, decreased energy 0   Change in appetite 0   Feeling bad or failure about yourself  0   Trouble concentrating 0   Moving slowly or fidgety/restless 0   PHQ-9 Score 2   Difficult doing work/chores  Not difficult at all      Psychosocial Evaluation and Intervention:     Psychosocial Evaluation - 04/21/15 0945    Psychosocial  Evaluation & Interventions   Interventions Encouraged to exercise with the program and follow exercise prescription;Stress management education   Comments Counselor met with Juan [name] today for initial psychosocial evaluation.  He is a 60 year old well-adjusted gentleman who had bypass surgery on Dec. 30, 2016.  He has a strong support system with a spouse of 32 years and adult children who live close by.  He is also actively involved in his local church.  Juan [name] reports he sleeps well; has a good appetite and denies a history of or current symptoms of anxiety or depression.  He states his mood is typically positive and he has minimal stress other than some work related issues.  Juan [name] has goals to be more educated and increase his stamina and strength while in this program.  He has home gym equipment and walks outside typically up to one hour each day so plans to maintain this following completion of this program.     Continued Psychosocial Services Needed Yes  Juan [name] will benefit from the psychoeducational components of this program - particularly stress management.        Psychosocial Re-Evaluation:     Psychosocial Re-Evaluation      04/02/15 0910           Psychosocial Re-Evaluation   Interventions Encouraged to attend Cardiac Rehabilitation for the exercise       Comments Juan [name] said he feel everything is going well in Cardiac Rehab for him and he has been able to attend frequently.           Vocational Rehabilitation: Provide vocational rehab assistance to qualifying candidates.   Vocational Rehab Evaluation & Intervention:     Vocational Rehab - 03/08/15 1503    Initial Vocational Rehab Evaluation & Intervention   Assessment shows need for Vocational Rehabilitation No      Education: Education Goals: Education classes will be provided on a weekly basis, covering required topics. Participant will state understanding/return demonstration of topics  presented.  Learning Barriers/Preferences:     Learning Barriers/Preferences - 03/08/15 1502    Learning Barriers/Preferences   Learning Barriers None   Learning Preferences None      Education Topics: General Nutrition Guidelines/Fats and Fiber: -Group instruction provided by verbal, written material, models and posters to present the general guidelines for heart healthy nutrition. Gives an explanation and review of dietary fats and fiber.   Controlling Sodium/Reading Food Labels: -Group verbal and written material supporting the discussion of sodium use in heart healthy nutrition. Review and explanation with models, verbal and written materials for utilization of the food label.   Exercise Physiology & Risk Factors: - Group verbal and written instruction with models to review the exercise physiology of the cardiovascular system and associated critical values. Details cardiovascular disease risk factors and the goals associated with each risk factor.   Aerobic Exercise & Resistance Training: - Gives group verbal and written discussion on the health impact of inactivity. On the components of aerobic and resistive training programs and the benefits of this training and how to safely progress through these programs.   Flexibility, Balance, General Exercise Guidelines: - Provides group verbal and written instruction on the benefits of flexibility and balance training programs. Provides general exercise guidelines with specific guidelines to those with heart or lung disease. Demonstration and skill  practice provided.   Stress Management: - Provides group verbal and written instruction about the health risks of elevated stress, cause of high stress, and healthy ways to reduce stress.   Depression: - Provides group verbal and written instruction on the correlation between heart/lung disease and depressed mood, treatment options, and the stigmas associated with seeking treatment.           Cardiac Rehab from 04/21/2015 in [facility] Cardiac and Pulmonary Rehab   Date  03/24/15   Educator  [facility]   Instruction Review Code  2- meets goals/outcomes      Anatomy & Physiology of the Heart: - Group verbal and written instruction and models provide basic cardiac anatomy and physiology, with the coronary electrical and arterial systems. Review of: AMI, Angina, Valve disease, Heart Failure, Cardiac Arrhythmia, Pacemakers, and the ICD.   Cardiac Procedures: - Group verbal and written instruction and models to describe the testing methods done to diagnose heart disease. Reviews the outcomes of the test results. Describes the treatment choices: Medical Management, Angioplasty, or Coronary Bypass Surgery.   Cardiac Medications: - Group verbal and written instruction to review commonly prescribed medications for heart disease. Reviews the medication, class of the drug, and side effects. Includes the steps to properly store meds and maintain the prescription regimen.      Cardiac Rehab from 04/21/2015 in [facility] Cardiac and Pulmonary Rehab   Date  04/21/15   Educator  [facility]   Instruction Review Code  2- meets goals/outcomes      Go Sex-Intimacy & Heart Disease, Get SMART - Goal Setting: - Group verbal and written instruction through game format to discuss heart disease and the return to sexual intimacy. Provides group verbal and written material to discuss and apply goal setting through the application of the S.M.A.R.T. Method.   Other Matters of the Heart: - Provides group verbal, written materials and models to describe Heart Failure, Angina, Valve Disease, and Diabetes in the realm of heart disease. Includes description of the disease process and treatment options available to the cardiac patient.   Exercise & Equipment Safety: - Individual verbal instruction and demonstration of equipment use and safety with use of the equipment.      Cardiac Rehab from 04/21/2015 in [facility] Cardiac and  Pulmonary Rehab   Date  03/08/15   Educator  C. EnterkinRN   Instruction Review Code  1- partially meets, needs review/practice      Infection Prevention: - Provides verbal and written material to individual with discussion of infection control including proper hand washing and proper equipment cleaning during exercise session.      Cardiac Rehab from 04/21/2015 in [facility] Cardiac and Pulmonary Rehab   Date  03/08/15   Educator  C. EnterkinRN   Instruction Review Code  2- meets goals/outcomes      Falls Prevention: - Provides verbal and written material to individual with discussion of falls prevention and safety.      Cardiac Rehab from 04/21/2015 in [facility] Cardiac and Pulmonary Rehab   Date  03/08/15   Educator  C. [place]   Instruction Review Code  2- meets goals/outcomes      Diabetes: - Individual verbal and written instruction to review signs/symptoms of diabetes, desired ranges of glucose level fasting, after meals and with exercise. Advice that pre and post exercise glucose checks will be done for 3 sessions at entry of program.      Cardiac Rehab from 04/21/2015 in [facility] Cardiac and Pulmonary Rehab  Date  03/08/15   Educator  Loletha Grayer ENterkinRN   Instruction Review Code  1- partially meets, needs review/practice       Knowledge Questionnaire Score:     Knowledge Questionnaire Score - 03/08/15 1502    Knowledge Questionnaire Score   Pre Score 26      Personal Goals and Risk Factors at Admission:     Personal Goals and Risk Factors at Admission - 03/08/15 1507    Core Components/Risk Factors/Patient Goals on Admission    Weight Management Yes   Intervention (read-only) Learn and follow the exercise and diet guidelines while in the program. Utilize the nutrition and education classes to help gain knowledge of the diet and exercise expectations in the program   Admit Weight 195 lb (88.451 kg)   Goal Weight: Short Term 170 lb (77.111 kg)  1 year ago Juan [name] was 230lbs    Sedentary Yes   Intervention (read-only) While in program, learn and follow the exercise prescription taught. Start at a low level workload and increase workload after able to maintain previous level for 30 minutes. Increase time before increasing intensity.   Goal Blood glucose control identified by blood glucose values, HgbA1C. Participant verbalizes understanding of the signs/symptoms of hyper/hypo glycemia, proper foot care and importance of medication and nutrition plan for blood glucose control.   Intervention (read-only) Provide nutrition & aerobic exercise along with prescribed medications to achieve blood glucose in normal ranges: Fasting 65-99 mg/dL   Hypertension Yes   Goal Participant will see blood pressure controlled within the values of 140/57m/Hg or within value directed by their physician.   Intervention (read-only) Provide nutrition & aerobic exercise along with prescribed medications to achieve BP 140/90 or less.   Lipids Yes   Goal Cholesterol controlled with medications as prescribed, with individualized exercise RX and with personalized nutrition plan. Value goals: LDL < 758m HDL > 4071mParticipant states understanding of desired cholesterol values and following prescriptions.   Intervention (read-only) Provide nutrition & aerobic exercise along with prescribed medications to achieve LDL <22m29mDL >40mg32mTake Less Medication Yes   Intervention Learn your risk factors and begin the lifestyle modifications for risk factor control during your time in the program.      Personal Goals and Risk Factors Review:      Goals and Risk Factor Review      04/02/15 0909           Core Components/Risk Factors/Patient Goals Review   Personal Goals Review Diabetes;Hypertension;Lipids       Review Juan [name] reports his HBA1c was done yesterday adn uch better down to 6.2 from he reported 11 in the hospital. He reported his cholestrol level yestersay was 133. His blood pressure is 140/80  walking on the treadmill  at 3.5mph.55m       Personal Goals Discharge (Final Personal Goals and Risk Factors Review):      Goals and Risk Factor Review - 04/02/15 0909    Core Components/Risk Factors/Patient Goals Review   Personal Goals Review Diabetes;Hypertension;Lipids   Review Juan [name] reports his HBA1c was done yesterday adn uch better down to 6.2 from he reported 11 in the hospital. He reported his cholestrol level yestersay was 133. His blood pressure is 140/80 walking on the treadmill  at 3.5mph. 62m  ITP Comments:     ITP Comments      03/08/15 1511 03/23/15 0837 04/02/15 0911 04/22/15 1247     ITP Comments  Decrease sodium. (Juan [name] said he has already met with a Registered Dietician that Dr. Rockey Situ arranged so he feels he doesn't need to meet individually with our Cardiac Rehab Registered [place]. ) Juan [name] already walks 7 days a week for 60 minutes at a time for exercise. Juan [name] reports he had a heart attack in his 95s and  has a total of 10 stents. He had open heart surgery x 4 in 2016. Juan [name] showed me where they tried to take a  vein for a graft in his right lower leg and sometimes he has right leg numbness which the MD knows about. Juan [name] reports the numbness is less when he walks.  30 DAy Review. Continue with ITP.  HAs attended one session since orientation Juan [name] reports he had a MI in his 13s and then 7 heart stents over the years and recently had a CABG. He is able to walk on the treadmill today at 3.50mh  today with no problems.  30 Day Review. Continue with the ITP.       Comments:

## 2015-04-28 ENCOUNTER — Encounter: Payer: BLUE CROSS/BLUE SHIELD | Admitting: Cardiothoracic Surgery

## 2015-04-28 DIAGNOSIS — Z951 Presence of aortocoronary bypass graft: Secondary | ICD-10-CM

## 2015-04-28 NOTE — Progress Notes (Signed)
Daily Session Note  Patient Details  Name: [name] MRN: [id number] Date of Birth: January 29, 1956 Referring Provider:  Minna Merritts, MD  Encounter Date: 04/28/2015  Check In:     Session Check In - 04/28/15 0830    Check-In   Location [facility]-Cardiac & Pulmonary Rehab   Staff Present Heath Lark, RN, BSN, CCRP;Renee Dillard [place], MS, ACSM CEP, Exercise Physiologist;Michaelina Blandino, BS, ACSM EP-C, Exercise Physiologist   Supervising physician immediately available to respond to emergencies See telemetry face sheet for immediately available ER MD   Medication changes reported     No   Fall or balance concerns reported    No   Warm-up and Cool-down Performed on first and last piece of equipment   Resistance Training Performed No   VAD Patient? No   Pain Assessment   Currently in Pain? No/denies         Goals Met:  Proper associated with RPD/PD & O2 Sat Exercise tolerated well No report of cardiac concerns or symptoms Strength training completed today  Goals Unmet:  Not Applicable  Comments:    Dr. Emily Filbert is Medical Director for [facility].

## 2015-04-29 ENCOUNTER — Ambulatory Visit (INDEPENDENT_AMBULATORY_CARE_PROVIDER_SITE_OTHER): Payer: BLUE CROSS/BLUE SHIELD | Admitting: Cardiovascular Disease

## 2015-04-29 ENCOUNTER — Encounter: Payer: Self-pay | Admitting: Cardiovascular Disease

## 2015-04-29 VITALS — BP 120/82 | HR 68 | Ht 68.0 in | Wt 200.0 lb

## 2015-04-29 DIAGNOSIS — I10 Essential (primary) hypertension: Secondary | ICD-10-CM

## 2015-04-29 DIAGNOSIS — E785 Hyperlipidemia, unspecified: Secondary | ICD-10-CM

## 2015-04-29 DIAGNOSIS — Z951 Presence of aortocoronary bypass graft: Secondary | ICD-10-CM

## 2015-04-29 DIAGNOSIS — E1159 Type 2 diabetes mellitus with other circulatory complications: Secondary | ICD-10-CM

## 2015-04-29 NOTE — Assessment & Plan Note (Signed)
Cholesterol is at goal on the current lipid regimen. No changes to the medications were made.  

## 2015-04-29 NOTE — Assessment & Plan Note (Signed)
We have encouraged continued exercise, careful diet management in an effort to lose weight. 

## 2015-04-29 NOTE — Assessment & Plan Note (Signed)
Suggested he stay on the aspirin and Plavix if tolerated Currently with no symptoms of angina. No further workup at this time. Continue current medication regimen.

## 2015-04-29 NOTE — Patient Instructions (Signed)
You are doing well. No medication changes were made.  Please call us if you have new issues that need to be addressed before your next appt.  Your physician wants you to follow-up in: 6 months.  You will receive a reminder letter in the mail two months in advance. If you don't receive a letter, please call our office to schedule the follow-up appointment.   

## 2015-04-29 NOTE — Progress Notes (Signed)
Patient ID: Juan [name], male    DOB: 1955-07-18, 60 y.o.   MRN: [id number]  HPI Comments: Juan [name] is a very pleasant 59 year old male with a history of premature coronary artery disease, multiple cardiac catheterizations with intervention in the past dating back to 1994, total of 8 stents, History of diabetes and hyperlipidemia,  Who presented to  [facility] with angina,  Cardiac catheterization showing three-vessel disease,  Preferred to [facility] for CABG.   he presents today for follow-up after his bypass surgery 01/29/15  Coronary artery bypass grafting x4 (left internal mammary artery to LAD, saphenous vein graft to posterior descending, saphenous vein graft to OM1, saphenous vein graft to OM2)  withEndoscopic harvest of bilateral leg greater saphenous vein by Dr. Prescott Gum on 01/29/2015.   cardiac catheterization 01/21/2015 showed     Prox RCA to Mid RCA lesion, 70% stenosed. The lesion was previously treated with a stent (unknown type), Prox RCA lesion, 70% stenosed.     Mid RCA lesion, 90% stenosed.     Mid Cx lesion, 90% stenosed, Ost Cx lesion, 90% stenosed.Ost 1st Mrg to 1st Mrg lesion, 90% stenosed.     Mid LAD lesion, 80% stenosed, 1st Diag lesion, 70% stenosed.  1. Severe diffuse calcified three-vessel coronary artery disease with previous multivessel stenting. 2. Normal LV systolic function and mildly elevated left ventricular end-diastolic pressure.   postoperatively he had brief period of atrial tachycardia/atrial fibrillation, started on amiodarone,  Converted to normal sinus rhythm.   In follow-up today, he reports that he has been doing well Following a strict diet, trying to lose weight, watching his sugars Recent lab work reviewed with him showing hemoglobin A1c 6.2, total cholesterol 133, LDL 50 He is interested in coming off Plavix as he reports this is causing sores on his legs, cause of the drain hole in his abdomen that is not healing well  Other past medical  history No smoking history. Reports his father also had coronary artery disease in his 47s.      No Known Allergies  Current Outpatient Prescriptions on File Prior to Visit  Medication Sig Dispense Refill  . aspirin 81 MG tablet Take 81 mg by mouth daily.    . clopidogrel (PLAVIX) 75 MG tablet Take 1 tablet (75 mg total) by mouth daily. 30 tablet 3  . folic acid (FOLVITE) A999333 MCG tablet Take 800 mcg by mouth daily.     . Insulin Glargine (LANTUS) 100 UNIT/ML Solostar Pen Inject 24 Units into the skin daily at 10 pm. (Patient taking differently: Inject 20-22 Units into the skin daily at 10 pm. ) 15 mL 11  . levothyroxine (SYNTHROID) 100 MCG tablet Take 1 tablet (100 mcg total) by mouth daily before breakfast. 90 tablet 0  . metFORMIN (GLUCOPHAGE-XR) 500 MG 24 hr tablet Take 1 tablet (500 mg total) by mouth 2 (two) times daily with a meal. 60 tablet 1  . metoprolol tartrate (LOPRESSOR) 50 MG tablet Take 1 tablet (50 mg total) by mouth 2 (two) times daily. 180 tablet 3  . Multiple Vitamin (MULTIVITAMIN) tablet Take 1 tablet by mouth 2 (two) times daily.     . nitroGLYCERIN (NITROSTAT) 0.4 MG SL tablet Place 0.4 mg under the tongue every 5 (five) minutes as needed for chest pain.    . Omega-3 Fatty Acids (FISH OIL PO) Take 1,800 mg by mouth daily.     . ramipril (ALTACE) 5 MG capsule Take 1 capsule (5 mg total) by mouth daily. [place]  capsule 3  . simvastatin (ZOCOR) 40 MG tablet TAKE 1 BY MOUTH DAILY 90 tablet 3   No current facility-administered medications on file prior to visit.    Past Medical History  Diagnosis Date  . Essential hypertension   . Hyperlipidemia   . Coronary artery disease     a. Dating back to 28 w/ h/o MI.  8 stents;  b. 10/2003 Cath [place]: LM 20d, RI 40p, LAD 30p, 32m, patent stent, D1 50p, LCX 30p, patent mid stent, 90d/diffuse, RCA dominant, 30p, patent stent, 98m/d, EF 44%-->Med Rx.  . Type II diabetes mellitus ([place])   . Mild sleep apnea   . Hypothyroidism   .  Myocardial infarction ([place]) 1996    "small MI"   . Heart murmur     found during office visit  . Chronic kidney disease   . Asthma     pt. reports that he was treated for pnueumonia as a young child only    Past Surgical History  Procedure Laterality Date  . Thyroidectomy    . Cardiac catheterization  1993    [place]; Dr.Harold; [place]; Dr. Joneen Boers; [employer]  . Cardiac catheterization  1996    ""  . Cardiac catheterization      ''''  . Cardiac catheterization      '''  . Cardiac catheterization      '''  . Cardiac catheterization  2002    '''  . Cardiac catheterization  2006    [facility]; [place]   . Cyst excision      right- wrist- ganglion   . Nasal septum surgery    . Cardiac stent      x 10  . Cardiac catheterization N/A 01/21/2015    Procedure: Left Heart Cath and Coronary Angiography;  Surgeon: Wellington Hampshire, MD;  Location: [place] [facility];  Service: Cardiovascular;  Laterality: N/A;  . Coronary artery bypass graft N/A 01/29/2015    Procedure: CORONARY ARTERY BYPASS GRAFTING (CABG)x four using left internal mammary artery artery and bilateral saphenous thigh vein using endoscope.;  Surgeon: Ivin Poot, MD;  Location: [place];  Service: Open Heart Surgery;  Laterality: N/A;  . Tee without cardioversion N/A 01/29/2015    Procedure: TRANSESOPHAGEAL ECHOCARDIOGRAM (TEE);  Surgeon: Ivin Poot, MD;  Location: [place];  Service: Open Heart Surgery;  Laterality: N/A;    Social History  reports that he has never smoked. He has never used smokeless tobacco. He reports that he drinks about 0.5 oz of alcohol per week. He reports that he does not use illicit drugs.  Family History family history includes Cancer (age of onset: 22) in his father; Cirrhosis in his sister; Drug abuse in his sister; Heart attack in his father and mother.   Review of Systems  Constitutional: Negative.   Respiratory:  Negative.   Cardiovascular: Negative.   Gastrointestinal: Negative.   Musculoskeletal: Negative.   Allergic/Immunologic: Negative.   Neurological: Negative.   Hematological: Negative.   Psychiatric/Behavioral: Negative.   All other systems reviewed and are negative.  BP 120/82 mmHg  Pulse 68  Ht 5\' 8"  (1.727 m)  Wt 200 lb (90.719 kg)  BMI 30.42 kg/m2  Physical Exam  Constitutional: He is oriented to person, place, and time. He appears well-developed and well-nourished.  HENT:  Head: Normocephalic.  Nose: Nose normal.  Mouth/Throat: Oropharynx is clear and moist.  Eyes: Conjunctivae are normal. Pupils  are equal, round, and reactive to light.  Neck: Normal range of motion. Neck supple. No JVD present.  Cardiovascular: Normal rate, regular rhythm, S1 normal, S2 normal, normal heart sounds and intact distal pulses.  Exam reveals no gallop and no friction rub.   No murmur heard.  Well healed mediastinal incision,  Trace edema of the right lower extremity ankle to the mid shin , nonpitting,  Small region of ecchymosis right medial heel  Pulmonary/Chest: Effort normal and breath sounds normal. No respiratory distress. He has no wheezes. He has no rales. He exhibits no tenderness.  Abdominal: Soft. Bowel sounds are normal. He exhibits no distension. There is no tenderness.  Musculoskeletal: Normal range of motion. He exhibits no edema or tenderness.  Lymphadenopathy:    He has no cervical adenopathy.  Neurological: He is alert and oriented to person, place, and time. Coordination normal.  Skin: Skin is warm and dry. No rash noted. No erythema.  Psychiatric: He has a normal mood and affect. His behavior is normal. Judgment and thought content normal.      Assessment and Plan   Nursing note and vitals reviewed.

## 2015-04-29 NOTE — Assessment & Plan Note (Signed)
Blood pressure is well controlled on today's visit. No changes made to the medications. 

## 2015-05-05 ENCOUNTER — Encounter: Payer: BLUE CROSS/BLUE SHIELD | Attending: Cardiovascular Disease

## 2015-05-05 DIAGNOSIS — Z951 Presence of aortocoronary bypass graft: Secondary | ICD-10-CM | POA: Insufficient documentation

## 2015-05-07 ENCOUNTER — Encounter: Payer: BLUE CROSS/BLUE SHIELD | Admitting: *Deleted

## 2015-05-07 DIAGNOSIS — Z951 Presence of aortocoronary bypass graft: Secondary | ICD-10-CM

## 2015-05-07 NOTE — Progress Notes (Signed)
Daily Session Note  Patient Details  Name: Juan [name] MRN: [id number] Date of Birth: 12/02/1955 Referring Provider:  Gollan, Timothy J, MD  Encounter Date: 05/07/2015  Check In:     Session Check In - 05/07/15 1003    Check-In   Location [facility]   Staff Present Susanne Bice, RN, BSN, CCRP;Carroll Enterkin, RN, BSN;Stacey Joyce, RRT, RCP, Respiratory Therapist   Supervising physician immediately available to respond to emergencies See telemetry face sheet for immediately available ER MD   Medication changes reported     No   Fall or balance concerns reported    No   Warm-up and Cool-down Performed on first and last piece of equipment   Resistance Training Performed Yes   VAD Patient? No   Pain Assessment   Currently in Pain? No/denies           Exercise Prescription Changes - 05/07/15 1000    Exercise Review   Progression Yes   Response to Exercise   Blood Pressure (Admit) 142/82 mmHg   Blood Pressure (Exercise) 140/80 mmHg   Blood Pressure (Exit) 112/72 mmHg   Heart Rate (Admit) 79 bpm   Heart Rate (Exercise) 105 bpm   Heart Rate (Exit) 91 bpm   Rating of Perceived Exertion (Exercise) 13   Symptoms None   Comments Reviewed individualized exercise prescription and made increases per departmental policy. Exercise increases were discussed with the patient and they were able to perform the new work loads without issue (no signs or symptoms).    Duration Progress to 30 minutes of continuous aerobic without signs/symptoms of physical distress   Intensity Rest + 30   Progression   Progression Continue progressive overload as per policy without signs/symptoms or physical distress.   Resistance Training   Training Prescription Yes   Weight 5   Reps 10-15   Treadmill   MPH 3.8   Grade 1   Minutes 40      Goals Met:  Proper associated with RPD/PD & O2 Sat Exercise tolerated well  Goals Unmet:  Not Applicable  Comments:    Dr. Mark  Miller is Medical Director for [facility] and [facility]. 

## 2015-05-12 ENCOUNTER — Encounter: Payer: Self-pay | Admitting: *Deleted

## 2015-05-12 NOTE — Progress Notes (Signed)
Cardiac Individual Treatment Plan  Patient Details  Name: Juan [name] MRN: [id number] Date of Birth: 04/01/1955 Referring Provider:  No ref. provider found  Initial Encounter Date:       Cardiac Rehab from 03/08/2015 in [facility] Cardiac and Pulmonary Rehab   Date  03/08/15      Visit Diagnosis: No diagnosis found.  Patient's Home Medications on Admission:  Current outpatient prescriptions:  .  aspirin 81 MG tablet, Take 81 mg by mouth daily., Disp: , Rfl:  .  clopidogrel (PLAVIX) 75 MG tablet, Take 1 tablet (75 mg total) by mouth daily., Disp: 30 tablet, Rfl: 3 .  folic acid (FOLVITE) 841 MCG tablet, Take 800 mcg by mouth daily. , Disp: , Rfl:  .  Insulin Glargine (LANTUS) 100 UNIT/ML Solostar Pen, Inject 24 Units into the skin daily at 10 pm. (Patient taking differently: Inject 20-22 Units into the skin daily at 10 pm. ), Disp: 15 mL, Rfl: 11 .  levothyroxine (SYNTHROID) 100 MCG tablet, Take 1 tablet (100 mcg total) by mouth daily before breakfast., Disp: 90 tablet, Rfl: 0 .  metFORMIN (GLUCOPHAGE-XR) 500 MG 24 hr tablet, Take 1 tablet (500 mg total) by mouth 2 (two) times daily with a meal., Disp: 60 tablet, Rfl: 1 .  metoprolol tartrate (LOPRESSOR) 50 MG tablet, Take 1 tablet (50 mg total) by mouth 2 (two) times daily., Disp: 180 tablet, Rfl: 3 .  Multiple Vitamin (MULTIVITAMIN) tablet, Take 1 tablet by mouth 2 (two) times daily. , Disp: , Rfl:  .  nitroGLYCERIN (NITROSTAT) 0.4 MG SL tablet, Place 0.4 mg under the tongue every 5 (five) minutes as needed for chest pain., Disp: , Rfl:  .  Omega-3 Fatty Acids (FISH OIL PO), Take 1,800 mg by mouth daily. , Disp: , Rfl:  .  ramipril (ALTACE) 5 MG capsule, Take 1 capsule (5 mg total) by mouth daily., Disp: 90 capsule, Rfl: 3 .  simvastatin (ZOCOR) 40 MG tablet, TAKE 1 BY MOUTH DAILY, Disp: 90 tablet, Rfl: 3  Past Medical History: Past Medical History  Diagnosis Date  . Essential hypertension   . Hyperlipidemia   . Coronary artery  disease     a. Dating back to 56 w/ h/o MI.  8 stents;  b. 10/2003 Cath [facility]: LM 20d, RI 40p, LAD 30p, 21m patent stent, D1 50p, LCX 30p, patent mid stent, 90d/diffuse, RCA dominant, 30p, patent stent, 421m, EF 44%-->Med Rx.  . [place] II [place] mellitus ([facility]  . Mild sleep apnea   . Hypothyroidism   . [place]ocardial [place]    "small MI"   . Heart murmur     found during office visit  . Chronic kidney disease   . Asthma     pt. reports that he was treated for pnueumonia as a young child only    Tobacco Use: History  Smoking status  . Never Smoker   Smokeless tobacco  . Never Used    Labs: Recent Review Flowsheet Data    Labs for ITP Cardiac and Pulmonary Rehab Latest Ref Rng 01/29/2015 01/29/2015 01/29/2015 01/29/2015 01/30/2015   PHART 7.350 - 7.450 7.431 7.397 7.355 - -   PCO2ART 35.0 - 45.0 mmHg 35.9 40.5 42.0 - -   HCO3 20.0 - 24.0 mEq/L 24.2(H) 25.1(H) 23.5 - -   TCO2 0 - 100 mmol/L _0 ACIDBASEDEF 0.0 - 2.0 mmol/L - - 2.0 - -   O2SAT - 99.0 99.0 98.0 - -  Exercise Target Goals:    Exercise Program Goal: Individual exercise prescription set with THRR, safety & activity barriers. Participant demonstrates ability to understand and report RPE using BORG scale, to self-measure pulse accurately, and to acknowledge the importance of the exercise prescription.  Exercise Prescription Goal: Starting with aerobic activity 30 plus minutes a day, 3 days per week for initial exercise prescription. Provide home exercise prescription and guidelines that participant acknowledges understanding prior to discharge.  Activity Barriers & Risk Stratification:     Activity Barriers & Cardiac Risk Stratification - 03/08/15 1501    Activity Barriers & Cardiac Risk Stratification   Activity Barriers None   Cardiac Risk Stratification High      6 Minute Walk:     6 Minute Walk      03/08/15 1411       6 Minute Walk   Phase Initial     Distance  1700 feet     Walk Time 6 minutes     # of Rest Breaks 0     MPH 3.2     RPE 8     Resting HR 78 bpm     Resting BP 128/80 mmHg     Max Ex. HR 110 bpm     Max Ex. BP 122/68 mmHg     Pre/Post BP   Pre/Post BP? --        Initial Exercise Prescription:     Initial Exercise Prescription - 03/08/15 1400    Date of Initial Exercise Prescription   Date 03/08/15   Treadmill   MPH 3   Grade 0   Minutes 20   Bike   Level 0.4   Watts 15   Minutes 20   Recumbant Bike   Level 6   RPM 50   Watts 50   Minutes 20   NuStep   Level 4   Watts 55   Minutes 20   Arm Ergometer   Level 1   Watts 10   Minutes 15   Arm/Foot Ergometer   Level 4   Watts 15   Minutes 15   Cybex   Level 4   RPM 60   Minutes 20   Recumbant Elliptical   Level 2   RPM 40   Watts 20   Minutes 20   Elliptical   Level 1   Speed 3   Minutes 5   REL-XR   Level 3   Watts 50   Minutes 20   T5 Nustep   Level 3   Watts 30   Minutes 20   Biostep-RELP   Level 5   Watts 40   Minutes 20   Prescription Details   Frequency (times per week) 3   Duration Progress to 50 minutes of aerobic without signs/symptoms of physical distress   Intensity   THRR REST +  30   Ratings of Perceived Exertion 11-15   Progression   Progression Continue progressive overload as per policy without signs/symptoms or physical distress.   Resistance Training   Training Prescription Yes   Weight 4   Reps 10-15      Perform Capillary Blood Glucose checks as needed.  Exercise Prescription Changes:     Exercise Prescription Changes      03/17/15 0900 04/02/15 0900 05/07/15 1000       Exercise Review   Progression  Yes Yes     Response to Exercise   Blood Pressure (Admit)  142/82 mmHg 142/82 mmHg  Blood Pressure (Exercise)  140/80 mmHg 140/80 mmHg     Blood Pressure (Exit)  112/72 mmHg 112/72 mmHg     Heart Rate (Admit)  79 bpm 79 bpm     Heart Rate (Exercise)  105 bpm 105 bpm     Heart Rate (Exit)  91 bpm  91 bpm     Rating of Perceived Exertion (Exercise)  13 13     Symptoms None None None     Comments First day of exercise! Patient was oriented to the gym and the equipment functions and settings. Procedures and policies of the gym were outlined and explained. The patient's individual exercise prescription and treatment plan were reviewed with them. All starting workloads were established based on the results of the functional testing  done at the initial intake visit. The plan for exercise progression was also introduced and progression will be customized based on the patient's performance and goals.  Reviewed individualized exercise prescription and made increases per departmental policy. Exercise increases were discussed with the patient and they were able to perform the new work loads without issue (no signs or symptoms).  Reviewed individualized exercise prescription and made increases per departmental policy. Exercise increases were discussed with the patient and they were able to perform the new work loads without issue (no signs or symptoms).      Duration Progress to 30 minutes of continuous aerobic without signs/symptoms of physical distress Progress to 30 minutes of continuous aerobic without signs/symptoms of physical distress Progress to 30 minutes of continuous aerobic without signs/symptoms of physical distress     Intensity Rest + 30 Rest + 30 Rest + 30     Progression   Progression Continue progressive overload as per policy without signs/symptoms or physical distress. Continue progressive overload as per policy without signs/symptoms or physical distress. Continue progressive overload as per policy without signs/symptoms or physical distress.     Resistance Training   Training Prescription  Yes Yes     Weight  5 5     Reps  10-15 10-15     Training Prescription (read-only) Yes       Weight (read-only) 4       Reps (read-only) 10-15       Treadmill   MPH  3.6 3.8     Grade  1 1      Minutes  14 40     MPH (read-only) 2       Grade (read-only) 0       Minutes (read-only) 20          Exercise Comments:   Discharge Exercise Prescription (Final Exercise Prescription Changes):     Exercise Prescription Changes - 05/07/15 1000    Exercise Review   Progression Yes   Response to Exercise   Blood Pressure (Admit) 142/82 mmHg   Blood Pressure (Exercise) 140/80 mmHg   Blood Pressure (Exit) 112/72 mmHg   Heart Rate (Admit) 79 bpm   Heart Rate (Exercise) 105 bpm   Heart Rate (Exit) 91 bpm   Rating of Perceived Exertion (Exercise) 13   Symptoms None   Comments Reviewed individualized exercise prescription and made increases per departmental policy. Exercise increases were discussed with the patient and they were able to perform the new work loads without issue (no signs or symptoms).    Duration Progress to 30 minutes of continuous aerobic without signs/symptoms of physical distress   Intensity Rest + 30   Progression   Progression Continue progressive overload  as per policy without signs/symptoms or physical distress.   Resistance Training   Training Prescription Yes   Weight 5   Reps 10-15   Treadmill   MPH 3.8   Grade 1   Minutes 40      Nutrition:  Target Goals: Understanding of nutrition guidelines, daily intake of sodium <1551m, cholesterol <2048m calories 30% from fat and 7% or less from saturated fats, daily to have 5 or more servings of fruits and vegetables.  Biometrics:     Pre Biometrics - 03/08/15 1410    Pre Biometrics   Height 5' 7.6" (1.717 m)   Weight 195 lb 9.6 oz (88.724 kg)   Waist Circumference 40.5 inches   Hip Circumference 38.25 inches   Waist to Hip Ratio 1.06 %   BMI (Calculated) 30.2       Nutrition Therapy Plan and Nutrition Goals:     Nutrition Therapy & Goals - 03/08/15 1505    Nutrition Therapy   Drug/Food Interactions Statins/Certain Fruits   Personal Nutrition Goals   Personal Goal #1 Decrease sodium. ([name]  said he has already met with a Registered Dietician that Dr. GoRockey Siturranged so he feels he doesn't need to meet individually with our Cardiac Rehab Registered Dietician. )   Personal Goal #2 Limit saturated fats.   Personal Goal #3 Eat fruit with the skin on it ie peaches, apples but nothing in cans is what he previous dietician taught [name] he reprots.    Intervention Plan   Intervention (read-only) Using nutrition plan and personal goals to gain a healthy nutrition lifestyle. Add exercise as prescribed.      Nutrition Discharge: Rate Your Plate Scores:   Nutrition Goals Re-Evaluation:     Nutrition Goals Re-Evaluation      04/02/15 0908           Personal Goal #1 Re-Evaluation   Goal Progress Seen Yes       Comments Jospeh said again today he doesn't feel like he needs to meet individually with a registered dietician since he knows what to do.        Personal Goal #2 Re-Evaluation   Goal Progress Seen Yes       Personal Goal #3 Re-Evaluation   Goal Progress Seen Yes          Psychosocial: Target Goals: Acknowledge presence or absence of depression, maximize coping skills, provide positive support system. Participant is able to verbalize types and ability to use techniques and skills needed for reducing stress and depression.  Initial Review & Psychosocial Screening:     Initial Psych Review & Screening - 03/08/15 [place]   Comments JoWille Glaseras to pay a $3000 deductible for the new 2017 so he said he may not attend all 36 sessions since each session will be an out of pocket expense.    Barriers   Psychosocial barriers to participate in program There are no identifiable barriers or psychosocial needs.   Screening Interventions   Interventions Encouraged to exercise      Quality of Life Scores:     Quality of Life - 03/08/15 1539    Quality of Life Scores   Health/Function Pre 26 %   Socioeconomic Pre 27.86 %   Psych/Spiritual  Pre 28.29 %   Family Pre 30 %   GLOBAL Pre 27.44 %      PHQ-9:     Recent Review Flowsheet Data  Depression screen PHQ 2/9 03/08/2015   Decreased Interest 0   Down, Depressed, Hopeless 0   PHQ - 2 Score 0   Altered sleeping 2   Tired, decreased energy 0   Change in appetite 0   Feeling bad or failure about yourself  0   Trouble concentrating 0   Moving slowly or fidgety/restless 0   PHQ-9 Score 2   Difficult doing work/chores Not difficult at all      Psychosocial Evaluation and Intervention:     Psychosocial Evaluation - 04/21/15 0945    Psychosocial Evaluation & Interventions   Interventions Encouraged to exercise with the program and follow exercise prescription;Stress management education   Comments Counselor met with Mr. Mahabir today for initial psychosocial evaluation.  He is a 60 year old well-adjusted gentleman who had bypass surgery on Dec. 30, 2016.  He has a strong support system with a spouse of 91 years and adult children who live close by.  He is also actively involved in his local church.  Mr. Mcfarren reports he sleeps well; has a good appetite and denies a history of or current symptoms of anxiety or depression.  He states his mood is typically positive and he has minimal stress other than some work related issues.  Mr. Manninen has goals to be more educated and increase his stamina and strength while in this program.  He has home gym equipment and walks outside typically up to one hour each day so plans to maintain this following completion of this program.     Continued Psychosocial Services Needed Yes  Mr. Friday will benefit from the psychoeducational components of this program - particularly stress management.        Psychosocial Re-Evaluation:     Psychosocial Re-Evaluation      04/02/15 0910           Psychosocial Re-Evaluation   Interventions Encouraged to attend Cardiac Rehabilitation for the exercise       Comments [name] said he feel  everything is going well in Cardiac Rehab for him and he has been able to attend frequently.           Vocational Rehabilitation: Provide vocational rehab assistance to qualifying candidates.   Vocational Rehab Evaluation & Intervention:     Vocational Rehab - 03/08/15 1503    Initial Vocational Rehab Evaluation & Intervention   Assessment shows need for Vocational Rehabilitation No      Education: Education Goals: Education classes will be provided on a weekly basis, covering required topics. Participant will state understanding/return demonstration of topics presented.  Learning Barriers/Preferences:     Learning Barriers/Preferences - 03/08/15 1502    Learning Barriers/Preferences   Learning Barriers None   Learning Preferences None      Education Topics: General Nutrition Guidelines/Fats and Fiber: -Group instruction provided by verbal, written material, models and posters to present the general guidelines for heart healthy nutrition. Gives an explanation and review of dietary fats and fiber.   Controlling Sodium/Reading Food Labels: -Group verbal and written material supporting the discussion of sodium use in heart healthy nutrition. Review and explanation with models, verbal and written materials for utilization of the food label.   Exercise Physiology & Risk Factors: - Group verbal and written instruction with models to review the exercise physiology of the cardiovascular system and associated critical values. Details cardiovascular disease risk factors and the goals associated with each risk factor.   Aerobic Exercise & Resistance Training: - Gives group verbal and  written discussion on the health impact of inactivity. On the components of aerobic and resistive training programs and the benefits of this training and how to safely progress through these programs.   Flexibility, Balance, General Exercise Guidelines: - Provides group verbal and written instruction  on the benefits of flexibility and balance training programs. Provides general exercise guidelines with specific guidelines to those with heart or lung disease. Demonstration and skill practice provided.   Stress Management: - Provides group verbal and written instruction about the health risks of elevated stress, cause of high stress, and healthy ways to reduce stress.   Depression: - Provides group verbal and written instruction on the correlation between heart/lung disease and depressed mood, treatment options, and the stigmas associated with seeking treatment.          Cardiac Rehab from 04/21/2015 in [facility] Cardiac and Pulmonary Rehab   Date  03/24/15   Educator  [facility]   Instruction Review Code  2- meets goals/outcomes      Anatomy & Physiology of the Heart: - Group verbal and written instruction and models provide basic cardiac anatomy and physiology, with the coronary electrical and arterial systems. Review of: AMI, Angina, Valve disease, Heart Failure, Cardiac Arrhythmia, Pacemakers, and the ICD.   Cardiac Procedures: - Group verbal and written instruction and models to describe the testing methods done to diagnose heart disease. Reviews the outcomes of the test results. Describes the treatment choices: Medical Management, Angioplasty, or Coronary Bypass Surgery.   Cardiac Medications: - Group verbal and written instruction to review commonly prescribed medications for heart disease. Reviews the medication, class of the drug, and side effects. Includes the steps to properly store meds and maintain the prescription regimen.      Cardiac Rehab from 04/21/2015 in [facility] Cardiac and Pulmonary Rehab   Date  04/21/15   Educator  [facility]   Instruction Review Code  2- meets goals/outcomes      Go Sex-Intimacy & Heart Disease, Get SMART - Goal Setting: - Group verbal and written instruction through game format to discuss heart disease and the return to sexual intimacy. Provides group verbal and  written material to discuss and apply goal setting through the application of the S.M.A.R.T. Method.   Other Matters of the Heart: - Provides group verbal, written materials and models to describe Heart Failure, Angina, Valve Disease, and Diabetes in the realm of heart disease. Includes description of the disease process and treatment options available to the cardiac patient.   Exercise & Equipment Safety: - Individual verbal instruction and demonstration of equipment use and safety with use of the equipment.      Cardiac Rehab from 04/21/2015 in [facility] Cardiac and Pulmonary Rehab   Date  03/08/15   Educator  C. EnterkinRN   Instruction Review Code  1- partially meets, needs review/practice      Infection Prevention: - Provides verbal and written material to individual with discussion of infection control including proper hand washing and proper equipment cleaning during exercise session.      Cardiac Rehab from 04/21/2015 in [facility] Cardiac and Pulmonary Rehab   Date  03/08/15   Educator  C. EnterkinRN   Instruction Review Code  2- meets goals/outcomes      Falls Prevention: - Provides verbal and written material to individual with discussion of falls prevention and safety.      Cardiac Rehab from 04/21/2015 in [facility] Cardiac and Pulmonary Rehab   Date  03/08/15   Educator  C. EnterkinRN  Instruction Review Code  2- meets goals/outcomes      Diabetes: - Individual verbal and written instruction to review signs/symptoms of diabetes, desired ranges of glucose level fasting, after meals and with exercise. Advice that pre and post exercise glucose checks will be done for 3 sessions at entry of program.      Cardiac Rehab from 04/21/2015 in [facility] Cardiac and Pulmonary Rehab   Date  03/08/15   Educator  Loletha Grayer ENterkinRN   Instruction Review Code  1- partially meets, needs review/practice       Knowledge Questionnaire Score:     Knowledge Questionnaire Score - 03/08/15 1502    Knowledge  Questionnaire Score   Pre Score 26      Core Components/Risk Factors/Patient Goals at Admission:     Personal Goals and Risk Factors at Admission - 03/08/15 1507    Core Components/Risk Factors/Patient Goals on Admission    Weight Management Yes   Intervention (read-only) Learn and follow the exercise and diet guidelines while in the program. Utilize the nutrition and education classes to help gain knowledge of the diet and exercise expectations in the program   Admit Weight 195 lb (88.451 kg)   Goal Weight: Short Term 170 lb (77.111 kg)  1 year ago [name] was 230lbs   Sedentary Yes   Intervention (read-only) While in program, learn and follow the exercise prescription taught. Start at a low level workload and increase workload after able to maintain previous level for 30 minutes. Increase time before increasing intensity.   Goal Blood glucose control identified by blood glucose values, HgbA1C. Participant verbalizes understanding of the signs/symptoms of hyper/hypo glycemia, proper foot care and importance of medication and nutrition plan for blood glucose control.   Intervention (read-only) Provide nutrition & aerobic exercise along with prescribed medications to achieve blood glucose in normal ranges: Fasting 65-99 mg/dL   Hypertension Yes   Goal Participant will see blood pressure controlled within the values of 140/69m/Hg or within value directed by their physician.   Intervention (read-only) Provide nutrition & aerobic exercise along with prescribed medications to achieve BP 140/90 or less.   Lipids Yes   Goal Cholesterol controlled with medications as prescribed, with individualized exercise RX and with personalized nutrition plan. Value goals: LDL < 759m HDL > 4042mParticipant states understanding of desired cholesterol values and following prescriptions.   Intervention (read-only) Provide nutrition & aerobic exercise along with prescribed medications to achieve LDL <36m58mDL >40mg25m Take Less Medication Yes   Intervention Learn your risk factors and begin the lifestyle modifications for risk factor control during your time in the program.      Core Components/Risk Factors/Patient Goals Review:      Goals and Risk Factor Review      04/02/15 0909           Core Components/Risk Factors/Patient Goals Review   Personal Goals Review Diabetes;Hypertension;Lipids       Review [name] reports his HBA1c was done yesterday adn uch better down to 6.2 from he reported 11 in the hospital. He reported his cholestrol level yestersay was 133. His blood pressure is 140/80 walking on the treadmill  at 3.5mph.54m       Core Components/Risk Factors/Patient Goals at Discharge (Final Review):      Goals and Risk Factor Review - 04/02/15 0909    Core Components/Risk Factors/Patient Goals Review   Personal Goals Review Diabetes;Hypertension;Lipids   Review [name] reports his HBA1c was done  yesterday adn uch better down to 6.2 from he reported 11 in the hospital. He reported his cholestrol level yestersay was 133. His blood pressure is 140/80 walking on the treadmill  at 3.88mh.      ITP Comments:     ITP Comments      03/08/15 1511 03/23/15 0837 04/02/15 0911 04/22/15 1247     ITP Comments Decrease sodium. ([name] said he has already met with a Registered Dietician that Dr. GRockey Situarranged so he feels he doesn't need to meet individually with our Cardiac Rehab Registered DMullica Hill ) [name] already walks 7 days a week for 60 minutes at a time for exercise. [name] reports he had a heart attack in his 89sand  has a total of 10 stents. He had open heart surgery x 4 in 2016. [name] showed me where they tried to take a  vein for a graft in his right lower leg and sometimes he has right leg numbness which the MD knows about. [name] reports the numbness is less when he walks.  30 DAy Review. Continue with ITP.  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 He is able to walk on the treadmill today at 3.553m  today with no problems.  30 Day Review. Continue with the ITP.       Comments:

## 2015-05-23 ENCOUNTER — Encounter: Payer: Self-pay | Admitting: *Deleted

## 2015-05-23 DIAGNOSIS — Z951 Presence of aortocoronary bypass graft: Secondary | ICD-10-CM

## 2015-05-23 NOTE — Progress Notes (Signed)
Cardiac Individual Treatment Plan  Patient Details  Name: Juan [name] MRN: [id number] Date of Birth: 15-Dec-1955 Referring Provider:    Initial Encounter Date:       Cardiac Rehab from 03/08/2015 in [facility] Cardiac and Pulmonary Rehab   Date  03/08/15      Visit Diagnosis: S/P CABG x 4  Patient's Home Medications on Admission:  Current outpatient prescriptions:  .  aspirin 81 MG tablet, Take 81 mg by mouth daily., Disp: , Rfl:  .  clopidogrel (PLAVIX) 75 MG tablet, Take 1 tablet (75 mg total) by mouth daily., Disp: 30 tablet, Rfl: 3 .  folic acid (FOLVITE) 875 MCG tablet, Take 800 mcg by mouth daily. , Disp: , Rfl:  .  Insulin Glargine (LANTUS) 100 UNIT/ML Solostar Pen, Inject 24 Units into the skin daily at 10 pm. (Patient taking differently: Inject 20-22 Units into the skin daily at 10 pm. ), Disp: 15 mL, Rfl: 11 .  levothyroxine (SYNTHROID) 100 MCG tablet, Take 1 tablet (100 mcg total) by mouth daily before breakfast., Disp: 90 tablet, Rfl: 0 .  metFORMIN (GLUCOPHAGE-XR) 500 MG 24 hr tablet, Take 1 tablet (500 mg total) by mouth 2 (two) times daily with a meal., Disp: 60 tablet, Rfl: 1 .  metoprolol tartrate (LOPRESSOR) 50 MG tablet, Take 1 tablet (50 mg total) by mouth 2 (two) times daily., Disp: 180 tablet, Rfl: 3 .  Multiple Vitamin (MULTIVITAMIN) tablet, Take 1 tablet by mouth 2 (two) times daily. , Disp: , Rfl:  .  nitroGLYCERIN (NITROSTAT) 0.4 MG SL tablet, Place 0.4 mg under the tongue every 5 (five) minutes as needed for chest pain., Disp: , Rfl:  .  Omega-3 Fatty Acids (FISH OIL PO), Take 1,800 mg by mouth daily. , Disp: , Rfl:  .  ramipril (ALTACE) 5 MG capsule, Take 1 capsule (5 mg total) by mouth daily., Disp: 90 capsule, Rfl: 3 .  simvastatin (ZOCOR) 40 MG tablet, TAKE 1 BY MOUTH DAILY, Disp: 90 tablet, Rfl: 3  Past Medical History: Past Medical History  Diagnosis Date  . Essential hypertension   . Hyperlipidemia   . Coronary artery disease     a. Dating back to 60  w/ h/o MI.  8 stents;  b. 10/2003 Cath [place]: LM 20d, RI 40p, LAD 30p, 50m patent stent, D1 50p, LCX 30p, patent mid stent, 90d/diffuse, RCA dominant, 30p, patent stent, 477m, EF 44%-->Med Rx.  . Type II diabetes mellitus ([facility]  . Mild sleep apnea   . Hypothyroidism   . Myocardial infarction ([facility]1996    "small MI"   . Heart murmur     found during office visit  . Chronic kidney disease   . Asthma     pt. reports that he was treated for pnueumonia as a young child only    Tobacco Use: History  Smoking status  . Never Smoker   Smokeless tobacco  . Never Used    Labs: Recent Review Flowsheet Data    Labs for ITP Cardiac and Pulmonary Rehab Latest Ref Rng 01/29/2015 01/29/2015 01/29/2015 01/29/2015 01/30/2015   PHART 7.350 - 7.450 7.431 7.397 7.355 - -   PCO2ART 35.0 - 45.0 mmHg 35.9 40.5 42.0 - -   HCO3 20.0 - 24.0 mEq/L 24.2(H) 25.1(H) 23.5 - -   TCO2 0 - 100 mmol/L _0 ACIDBASEDEF 0.0 - 2.0 mmol/L - - 2.0 - -   O2SAT - 99.0 99.0 98.0 - -  Exercise Target Goals:    Exercise Program Goal: Individual exercise prescription set with THRR, safety & activity barriers. Participant demonstrates ability to understand and report RPE using BORG scale, to self-measure pulse accurately, and to acknowledge the importance of the exercise prescription.  Exercise Prescription Goal: Starting with aerobic activity 30 plus minutes a day, 3 days per week for initial exercise prescription. Provide home exercise prescription and guidelines that participant acknowledges understanding prior to discharge.  Activity Barriers & Risk Stratification:     Activity Barriers & Cardiac Risk Stratification - 03/08/15 1501    Activity Barriers & Cardiac Risk Stratification   Activity Barriers None   Cardiac Risk Stratification High      6 Minute Walk:     6 Minute Walk      03/08/15 1411       6 Minute Walk   Phase Initial     Distance 1700 feet     Walk Time 6 minutes      # of Rest Breaks 0     MPH 3.2     RPE 8     Resting HR 78 bpm     Resting BP 128/80 mmHg     Max Ex. HR 110 bpm     Max Ex. BP 122/68 mmHg     Pre/Post BP   Pre/Post BP? --        Initial Exercise Prescription:     Initial Exercise Prescription - 03/08/15 1400    Date of Initial Exercise RX and Referring Provider   Date 03/08/15   Treadmill   MPH 3   Grade 0   Minutes 20   Bike   Level 0.4   Watts 15   Minutes 20   Recumbant Bike   Level 6   RPM 50   Watts 50   Minutes 20   NuStep   Level 4   Watts 55   Minutes 20   Arm Ergometer   Level 1   Watts 10   Minutes 15   Arm/Foot Ergometer   Level 4   Watts 15   Minutes 15   Cybex   Level 4   RPM 60   Minutes 20   Recumbant Elliptical   Level 2   RPM 40   Watts 20   Minutes 20   Elliptical   Level 1   Speed 3   Minutes 5   REL-XR   Level 3   Watts 50   Minutes 20   T5 Nustep   Level 3   Watts 30   Minutes 20   Biostep-RELP   Level 5   Watts 40   Minutes 20   Prescription Details   Frequency (times per week) 3   Duration Progress to 50 minutes of aerobic without signs/symptoms of physical distress   Intensity   THRR REST +  30   Ratings of Perceived Exertion 11-15   Progression   Progression Continue progressive overload as per policy without signs/symptoms or physical distress.   Resistance Training   Training Prescription Yes   Weight 4   Reps 10-15      Perform Capillary Blood Glucose checks as needed.  Exercise Prescription Changes:     Exercise Prescription Changes      03/17/15 0900 04/02/15 0900 05/07/15 1000       Exercise Review   Progression  Yes Yes     Response to Exercise   Blood Pressure (Admit)  142/82 mmHg  128/78 mmHg     Blood Pressure (Exercise)  140/80 mmHg 144/78 mmHg     Blood Pressure (Exit)  112/72 mmHg 138/70 mmHg     Heart Rate (Admit)  79 bpm --     Heart Rate (Exercise)  105 bpm 107 bpm     Heart Rate (Exit)  91 bpm 93 bpm     Rating of  Perceived Exertion (Exercise)  13 10     Symptoms None None None     Comments First day of exercise! Patient was oriented to the gym and the equipment functions and settings. Procedures and policies of the gym were outlined and explained. The patient's individual exercise prescription and treatment plan were reviewed with them. All starting workloads were established based on the results of the functional testing  done at the initial intake visit. The plan for exercise progression was also introduced and progression will be customized based on the patient's performance and goals.  Reviewed individualized exercise prescription and made increases per departmental policy. Exercise increases were discussed with the patient and they were able to perform the new work loads without issue (no signs or symptoms).  --     Duration Progress to 30 minutes of continuous aerobic without signs/symptoms of physical distress Progress to 30 minutes of continuous aerobic without signs/symptoms of physical distress Progress to 45 minutes of aerobic exercise without signs/symptoms of physical distress     Intensity Rest + 30 Rest + 30 Rest + 30     Progression   Progression Continue progressive overload as per policy without signs/symptoms or physical distress. Continue progressive overload as per policy without signs/symptoms or physical distress. Continue progressive overload as per policy without signs/symptoms or physical distress.     Resistance Training   Training Prescription  Yes Yes     Weight  5 5     Reps  10-15 10-15     Training Prescription (read-only) Yes       Weight (read-only) 4       Reps (read-only) 10-15       Treadmill   MPH  3.6 3.8     Grade  1 1     Minutes  40 80     MPH (read-only) 2       Grade (read-only) 0       Minutes (read-only) 20          Exercise Comments:     Exercise Comments      05/19/15 0622           Exercise Comments Jaylin has nade increases in intensity and  duration on the TM since starting the program. He now aims for 45-50 min on the TM as time allows in class, however he has been absent from class for 2 weeks and may need to lower his intensity or duration once he comes back to class after an extended absense.           Discharge Exercise Prescription (Final Exercise Prescription Changes):     Exercise Prescription Changes - 05/07/15 1000    Exercise Review   Progression Yes   Response to Exercise   Blood Pressure (Admit) 128/78 mmHg   Blood Pressure (Exercise) 144/78 mmHg   Blood Pressure (Exit) 138/70 mmHg   Heart Rate (Admit) --   Heart Rate (Exercise) 107 bpm   Heart Rate (Exit) 93 bpm   Rating of Perceived Exertion (Exercise) 10   Symptoms None   Comments --  Duration Progress to 45 minutes of aerobic exercise without signs/symptoms of physical distress   Intensity Rest + 30   Progression   Progression Continue progressive overload as per policy without signs/symptoms or physical distress.   Resistance Training   Training Prescription Yes   Weight 5   Reps 10-15   Treadmill   MPH 3.8   Grade 1   Minutes 45      Nutrition:  Target Goals: Understanding of nutrition guidelines, daily intake of sodium <1535m, cholesterol <2033m calories 30% from fat and 7% or less from saturated fats, daily to have 5 or more servings of fruits and vegetables.  Biometrics:     Pre Biometrics - 03/08/15 1410    Pre Biometrics   Height 5' 7.6" (1.717 m)   Weight 195 lb 9.6 oz (88.724 kg)   Waist Circumference 40.5 inches   Hip Circumference 38.25 inches   Waist to Hip Ratio 1.06 %   BMI (Calculated) 30.2       Nutrition Therapy Plan and Nutrition Goals:     Nutrition Therapy & Goals - 03/08/15 1505    Nutrition Therapy   Drug/Food Interactions Statins/Certain Fruits   Personal Nutrition Goals   Personal Goal #1 Decrease sodium. ([name] said he has already met with a Registered Dietician that Dr. GoRockey Siturranged so he feels he  doesn't need to meet individually with our Cardiac Rehab Registered Dietician. )   Personal Goal #2 Limit saturated fats.   Personal Goal #3 Eat fruit with the skin on it ie peaches, apples but nothing in cans is what he previous dietician taught [name] he reprots.    Intervention Plan   Intervention (read-only) Using nutrition plan and personal goals to gain a healthy nutrition lifestyle. Add exercise as prescribed.      Nutrition Discharge: Rate Your Plate Scores:   Nutrition Goals Re-Evaluation:     Nutrition Goals Re-Evaluation      04/02/15 0908           Personal Goal #1 Re-Evaluation   Goal Progress Seen Yes       Comments Jospeh said again today he doesn't feel like he needs to meet individually with a registered dietician since he knows what to do.        Personal Goal #2 Re-Evaluation   Goal Progress Seen Yes       Personal Goal #3 Re-Evaluation   Goal Progress Seen Yes          Psychosocial: Target Goals: Acknowledge presence or absence of depression, maximize coping skills, provide positive support system. Participant is able to verbalize types and ability to use techniques and skills needed for reducing stress and depression.  Initial Review & Psychosocial Screening:     Initial Psych Review & Screening - 03/08/15 [place]   Comments JoWille Glaseras to pay a $3000 deductible for the new 2017 so he said he may not attend all 36 sessions since each session will be an out of pocket expense.    Barriers   Psychosocial barriers to participate in program There are no identifiable barriers or psychosocial needs.   Screening Interventions   Interventions Encouraged to exercise      Quality of Life Scores:     Quality of Life - 03/08/15 1539    Quality of Life Scores   Health/Function Pre 26 %   Socioeconomic Pre 27.86 %   Psych/Spiritual Pre 28.29 %  Family Pre 30 %   GLOBAL Pre 27.44 %      PHQ-9:     Recent Review  Flowsheet Data    Depression screen [facility] 2/9 03/08/2015   Decreased Interest 0   Down, Depressed, Hopeless 0   PHQ - 2 Score 0   Altered sleeping 2   Tired, decreased energy 0   Change in appetite 0   Feeling bad or failure about yourself  0   Trouble concentrating 0   Moving slowly or fidgety/restless 0   PHQ-9 Score 2   Difficult doing work/chores Not difficult at all      Psychosocial Evaluation and Intervention:     Psychosocial Evaluation - 04/21/15 0945    Psychosocial Evaluation & Interventions   Interventions Encouraged to exercise with the program and follow exercise prescription;Stress management education   Comments Counselor met with Mr. Milich today for initial psychosocial evaluation.  He is a 60 year old well-adjusted gentleman who had bypass surgery on Dec. 30, 2016.  He has a strong support system with a spouse of 25 years and adult children who live close by.  He is also actively involved in his local church.  Mr. Minerva reports he sleeps well; has a good appetite and denies a history of or current symptoms of anxiety or depression.  He states his mood is typically positive and he has minimal stress other than some work related issues.  Mr. Marucci has goals to be more educated and increase his stamina and strength while in this program.  He has home gym equipment and walks outside typically up to one hour each day so plans to maintain this following completion of this program.     Continued Psychosocial Services Needed Yes  Mr. Diiorio will benefit from the psychoeducational components of this program - particularly stress management.        Psychosocial Re-Evaluation:     Psychosocial Re-Evaluation      04/02/15 0910           Psychosocial Re-Evaluation   Interventions Encouraged to attend Cardiac Rehabilitation for the exercise       Comments [name] said he feel everything is going well in Cardiac Rehab for him and he has been able to attend frequently.            Vocational Rehabilitation: Provide vocational rehab assistance to qualifying candidates.   Vocational Rehab Evaluation & Intervention:     Vocational Rehab - 03/08/15 1503    Initial Vocational Rehab Evaluation & Intervention   Assessment shows need for Vocational Rehabilitation No      Education: Education Goals: Education classes will be provided on a weekly basis, covering required topics. Participant will state understanding/return demonstration of topics presented.  Learning Barriers/Preferences:     Learning Barriers/Preferences - 03/08/15 1502    Learning Barriers/Preferences   Learning Barriers None   Learning Preferences None      Education Topics: General Nutrition Guidelines/Fats and Fiber: -Group instruction provided by verbal, written material, models and posters to present the general guidelines for heart healthy nutrition. Gives an explanation and review of dietary fats and fiber.   Controlling Sodium/Reading Food Labels: -Group verbal and written material supporting the discussion of sodium use in heart healthy nutrition. Review and explanation with models, verbal and written materials for utilization of the food label.   Exercise Physiology & Risk Factors: - Group verbal and written instruction with models to review the exercise physiology of the cardiovascular system and  associated critical values. Details cardiovascular disease risk factors and the goals associated with each risk factor.   Aerobic Exercise & Resistance Training: - Gives group verbal and written discussion on the health impact of inactivity. On the components of aerobic and resistive training programs and the benefits of this training and how to safely progress through these programs.   Flexibility, Balance, General Exercise Guidelines: - Provides group verbal and written instruction on the benefits of flexibility and balance training programs. Provides general exercise  guidelines with specific guidelines to those with heart or lung disease. Demonstration and skill practice provided.   Stress Management: - Provides group verbal and written instruction about the health risks of elevated stress, cause of high stress, and healthy ways to reduce stress.   Depression: - Provides group verbal and written instruction on the correlation between heart/lung disease and depressed mood, treatment options, and the stigmas associated with seeking treatment.          Cardiac Rehab from 04/21/2015 in [facility] Cardiac and Pulmonary Rehab   Date  03/24/15   Educator  [facility]   Instruction Review Code  2- meets goals/outcomes      Anatomy & Physiology of the Heart: - Group verbal and written instruction and models provide basic cardiac anatomy and physiology, with the coronary electrical and arterial systems. Review of: AMI, Angina, Valve disease, Heart Failure, Cardiac Arrhythmia, Pacemakers, and the ICD.   Cardiac Procedures: - Group verbal and written instruction and models to describe the testing methods done to diagnose heart disease. Reviews the outcomes of the test results. Describes the treatment choices: Medical Management, Angioplasty, or Coronary Bypass Surgery.   Cardiac Medications: - Group verbal and written instruction to review commonly prescribed medications for heart disease. Reviews the medication, class of the drug, and side effects. Includes the steps to properly store meds and maintain the prescription regimen.      Cardiac Rehab from 04/21/2015 in [facility] Cardiac and Pulmonary Rehab   Date  04/21/15   Educator  SB   Instruction Review Code  2- meets goals/outcomes      Go Sex-Intimacy & Heart Disease, Get SMART - Goal Setting: - Group verbal and written instruction through game format to discuss heart disease and the return to sexual intimacy. Provides group verbal and written material to discuss and apply goal setting through the application of the  S.M.A.R.T. Method.   Other Matters of the Heart: - Provides group verbal, written materials and models to describe Heart Failure, Angina, Valve Disease, and Diabetes in the realm of heart disease. Includes description of the disease process and treatment options available to the cardiac patient.   Exercise & Equipment Safety: - Individual verbal instruction and demonstration of equipment use and safety with use of the equipment.      Cardiac Rehab from 04/21/2015 in [facility] Cardiac and Pulmonary Rehab   Date  03/08/15   Educator  C. EnterkinRN   Instruction Review Code  1- partially meets, needs review/practice      Infection Prevention: - Provides verbal and written material to individual with discussion of infection control including proper hand washing and proper equipment cleaning during exercise session.      Cardiac Rehab from 04/21/2015 in [facility] Cardiac and Pulmonary Rehab   Date  03/08/15   Educator  C. EnterkinRN   Instruction Review Code  2- meets goals/outcomes      Falls Prevention: - Provides verbal and written material to individual with discussion of falls prevention and  safety.      Cardiac Rehab from 04/21/2015 in [facility] Cardiac and Pulmonary Rehab   Date  03/08/15   Educator  C. [place]   Instruction Review Code  2- meets goals/outcomes      Diabetes: - Individual verbal and written instruction to review signs/symptoms of diabetes, desired ranges of glucose level fasting, after meals and with exercise. Advice that pre and post exercise glucose checks will be done for 3 sessions at entry of program.      Cardiac Rehab from 04/21/2015 in [facility] Cardiac and Pulmonary Rehab   Date  03/08/15   Educator  Loletha Grayer ENterkinRN   Instruction Review Code  1- partially meets, needs review/practice       Knowledge Questionnaire Score:     Knowledge Questionnaire Score - 03/08/15 1502    Knowledge Questionnaire Score   Pre Score 26      Core Components/Risk Factors/Patient  Goals at Admission:     Personal Goals and Risk Factors at Admission - 03/08/15 1507    Core Components/Risk Factors/Patient Goals on Admission    Weight Management Yes   Intervention (read-only) Learn and follow the exercise and diet guidelines while in the program. Utilize the nutrition and education classes to help gain knowledge of the diet and exercise expectations in the program   Admit Weight 195 lb (88.451 kg)   Goal Weight: Short Term 170 lb (77.111 kg)  1 year ago [name] was 230lbs   Sedentary Yes   Intervention (read-only) While in program, learn and follow the exercise prescription taught. Start at a low level workload and increase workload after able to maintain previous level for 30 minutes. Increase time before increasing intensity.   Goal Blood glucose control identified by blood glucose values, HgbA1C. Participant verbalizes understanding of the signs/symptoms of hyper/hypo glycemia, proper foot care and importance of medication and nutrition plan for blood glucose control.   Intervention (read-only) Provide nutrition & aerobic exercise along with prescribed medications to achieve blood glucose in normal ranges: Fasting 65-99 mg/dL   Hypertension Yes   Goal Participant will see blood pressure controlled within the values of 140/16m/Hg or within value directed by their physician.   Intervention (read-only) Provide nutrition & aerobic exercise along with prescribed medications to achieve BP 140/90 or less.   Lipids Yes   Goal Cholesterol controlled with medications as prescribed, with individualized exercise RX and with personalized nutrition plan. Value goals: LDL < 784m HDL > 408mParticipant states understanding of desired cholesterol values and following prescriptions.   Intervention (read-only) Provide nutrition & aerobic exercise along with prescribed medications to achieve LDL <75m46mDL >40mg82mTake Less Medication Yes   Intervention Learn your risk factors and begin the  lifestyle modifications for risk factor control during your time in the program.      Core Components/Risk Factors/Patient Goals Review:      Goals and Risk Factor Review      04/02/15 0909           Core Components/Risk Factors/Patient Goals Review   Personal Goals Review Diabetes;Hypertension;Lipids       Review [name] reports his HBA1c was done yesterday adn uch better down to 6.2 from he reported 11 in the hospital. He reported his cholestrol level yestersay was 133. His blood pressure is 140/80 walking on the treadmill  at 3.5mph.54m       Core Components/Risk Factors/Patient Goals at Discharge (Final Review):      Goals and  Risk Factor Review - 04/02/15 0909    Core Components/Risk Factors/Patient Goals Review   Personal Goals Review Diabetes;Hypertension;Lipids   Review [name] reports his HBA1c was done yesterday adn uch better down to 6.2 from he reported 11 in the hospital. He reported his cholestrol level yestersay was 133. His blood pressure is 140/80 walking on the treadmill  at 3.71mh.      ITP Comments:     ITP Comments      03/08/15 1511 03/23/15 0837 04/02/15 0911 04/22/15 1247 05/23/15 0926   ITP Comments Decrease sodium. ([name] said he has already met with a Registered Dietician that Dr. GRockey Situarranged so he feels he doesn't need to meet individually with our Cardiac Rehab Registered DKendall West ) [name] already walks 7 days a week for 60 minutes at a time for exercise. [name] reports he had a heart attack in his 370sand  has a total of 10 stents. He had open heart surgery x 4 in 2016. [name] showed me where they tried to take a  vein for a graft in his right lower leg and sometimes he has right leg numbness which the MD knows about. [name] reports the numbness is less when he walks.  30 DAy Review. Continue with ITP.  HAs attended one session since orientation [name] reports he had a MI in his 358sand then 7 heart stents over the years and recently had a CABG. He is able to walk on the  treadmill today at 3.565m  today with no problems.  30 Day Review. Continue with the ITP. 30 day review.  Continue with ITP  last visit 05/07/2015      Comments:

## 2015-05-26 ENCOUNTER — Encounter: Payer: BLUE CROSS/BLUE SHIELD | Admitting: *Deleted

## 2015-05-26 DIAGNOSIS — Z951 Presence of aortocoronary bypass graft: Secondary | ICD-10-CM

## 2015-05-26 NOTE — Progress Notes (Signed)
Daily Session Note  Patient Details  Name: Mical Kicklighter MRN: [id number] Date of Birth: [birth year] Referring Provider:    Encounter Date: 05/26/2015  Check In:     Session Check In - 05/26/15 0839    Check-In   Staff Present Nyoka Cowden, RN;Susanne Bice, RN, BSN, CCRP;Rebecca Sickles, DPT, [place] physician immediately available to respond to emergencies See telemetry face sheet for immediately available ER MD   Medication changes reported     No   Fall or balance concerns reported    No   Warm-up and Cool-down Performed on first and last piece of equipment   VAD Patient? No   Pain Assessment   Currently in Pain? No/denies         Goals Met:  Independence with exercise equipment Exercise tolerated well No report of cardiac concerns or symptoms Strength training completed today  Goals Unmet:  Not Applicable  Comments: Doing well with exercise prescription progression.    Dr. Emily Filbert is Medical Director for [place] and [facility].

## 2015-06-02 ENCOUNTER — Encounter: Payer: BLUE CROSS/BLUE SHIELD | Attending: Cardiovascular Disease

## 2015-06-02 DIAGNOSIS — Z951 Presence of aortocoronary bypass graft: Secondary | ICD-10-CM | POA: Insufficient documentation

## 2015-06-09 ENCOUNTER — Encounter: Payer: BLUE CROSS/BLUE SHIELD | Admitting: *Deleted

## 2015-06-09 DIAGNOSIS — Z951 Presence of aortocoronary bypass graft: Secondary | ICD-10-CM

## 2015-06-09 NOTE — Progress Notes (Signed)
Daily Session Note  Patient Details  Name: Juan [name] MRN: [id number] Date of Birth: [birth year] Referring Provider:    Encounter Date: 06/09/2015  Check In:     Session Check In - 06/09/15 0908    Check-In   Staff Present Heath Lark, RN, BSN, CCRP;Laureen Owens Shark, BS, RRT, Respiratory Therapist;Carroll Enterkin, RN, BSN   Supervising physician immediately available to respond to emergencies See telemetry face sheet for immediately available ER MD   Medication changes reported     No   Fall or balance concerns reported    No   Warm-up and Cool-down Performed on first and last piece of equipment   VAD Patient? No   Pain Assessment   Currently in Pain? No/denies         Goals Met:  Independence with exercise equipment Exercise tolerated well Personal goals reviewed No report of cardiac concerns or symptoms Strength training completed today  Goals Unmet:  Not Applicable  Comments: Doing well with exercise prescription progression.    Dr. Emily Filbert is Medical Director for [place] and [facility].

## 2015-06-16 ENCOUNTER — Encounter: Payer: BLUE CROSS/BLUE SHIELD | Admitting: *Deleted

## 2015-06-16 DIAGNOSIS — Z951 Presence of aortocoronary bypass graft: Secondary | ICD-10-CM | POA: Diagnosis not present

## 2015-06-16 NOTE — Progress Notes (Signed)
Daily Session Note  Patient Details  Name: Grace Haggart MRN: [id number] Date of Birth: 03/18/1955 Referring Provider:    Encounter Date: 06/16/2015  Check In:     Session Check In - 06/16/15 0835    Check-In   Location [facility]   Staff Present Heath Lark, RN, BSN, CCRP;Carroll Enterkin, RN, BSN;Jessica Luan Pulling, MA, ACSM RCEP, Exercise Physiologist;Amanda Oletta Darter, BA, ACSM CEP, Exercise Physiologist;Diane Joya Gaskins, RN, BSN   Supervising physician immediately available to respond to emergencies See telemetry face sheet for immediately available ER MD   Medication changes reported     No   Fall or balance concerns reported    No   Warm-up and Cool-down Performed on first and last piece of equipment   Resistance Training Performed Yes   VAD Patient? No   Pain Assessment   Currently in Pain? No/denies         Goals Met:  Independence with exercise equipment Exercise tolerated well No report of cardiac concerns or symptoms Strength training completed today  Goals Unmet:  Not Applicable  Comments:  Patient completed exercise prescription and all exercise goals during rehab session. The exercise was tolerated well and the patient is progressing in the program.    Dr. Emily Filbert is Medical Director for [place] and [facility].

## 2015-06-20 ENCOUNTER — Encounter: Payer: Self-pay | Admitting: *Deleted

## 2015-06-20 DIAGNOSIS — Z951 Presence of aortocoronary bypass graft: Secondary | ICD-10-CM

## 2015-06-20 NOTE — Progress Notes (Signed)
Cardiac Individual Treatment Plan  Patient Details  Name: Juan [name] MRN: [id number] Date of Birth: Dec 26, 1955 Referring Provider:    Initial Encounter Date:       Cardiac Rehab from 03/08/2015 in [facility] Cardiac and Pulmonary Rehab   Date  03/08/15      Visit Diagnosis: S/P CABG x 4  Patient's Home Medications on Admission:  Current outpatient prescriptions:  .  aspirin 81 MG tablet, Take 81 mg by mouth daily., Disp: , Rfl:  .  clopidogrel (PLAVIX) 75 MG tablet, Take 1 tablet (75 mg total) by mouth daily., Disp: 30 tablet, Rfl: 3 .  folic acid (FOLVITE) 875 MCG tablet, Take 800 mcg by mouth daily. , Disp: , Rfl:  .  Insulin Glargine (LANTUS) 100 UNIT/ML Solostar Pen, Inject 24 Units into the skin daily at 10 pm. (Patient taking differently: Inject 20-22 Units into the skin daily at 10 pm. ), Disp: 15 mL, Rfl: 11 .  levothyroxine (SYNTHROID) 100 MCG tablet, Take 1 tablet (100 mcg total) by mouth daily before breakfast., Disp: 90 tablet, Rfl: 0 .  metFORMIN (GLUCOPHAGE-XR) 500 MG 24 hr tablet, Take 1 tablet (500 mg total) by mouth 2 (two) times daily with a meal., Disp: 60 tablet, Rfl: 1 .  metoprolol tartrate (LOPRESSOR) 50 MG tablet, Take 1 tablet (50 mg total) by mouth 2 (two) times daily., Disp: 180 tablet, Rfl: 3 .  Multiple Vitamin (MULTIVITAMIN) tablet, Take 1 tablet by mouth 2 (two) times daily. , Disp: , Rfl:  .  nitroGLYCERIN (NITROSTAT) 0.4 MG SL tablet, Place 0.4 mg under the tongue every 5 (five) minutes as needed for chest pain., Disp: , Rfl:  .  Omega-3 Fatty Acids (FISH OIL PO), Take 1,800 mg by mouth daily. , Disp: , Rfl:  .  ramipril (ALTACE) 5 MG capsule, Take 1 capsule (5 mg total) by mouth daily., Disp: 90 capsule, Rfl: 3 .  simvastatin (ZOCOR) 40 MG tablet, TAKE 1 BY MOUTH DAILY, Disp: 90 tablet, Rfl: 3  Past Medical History: Past Medical History  Diagnosis Date  . Essential hypertension   . Hyperlipidemia   . Coronary artery disease     a. Dating back to 18  w/ h/o MI.  8 stents;  b. 10/2003 Cath [facility]: LM 20d, RI 40p, LAD 30p, 37m patent stent, D1 50p, LCX 30p, patent mid stent, 90d/diffuse, RCA dominant, 30p, patent stent, 475m, EF 44%-->Med Rx.  . Type II diabetes mellitus ([facility]  . Mild sleep apnea   . Hypothyroidism   . Myocardial infarction ([facility]1996    "small MI"   . Heart murmur     found during office visit  . Chronic kidney disease   . Asthma     pt. reports that he was treated for pnueumonia as a young child only    Tobacco Use: History  Smoking status  . Never Smoker   Smokeless tobacco  . Never Used    Labs: Recent Review Flowsheet Data    Labs for ITP Cardiac and Pulmonary Rehab Latest Ref Rng 01/29/2015 01/29/2015 01/29/2015 01/29/2015 01/30/2015   PHART 7.350 - 7.450 7.431 7.397 7.355 - -   PCO2ART 35.0 - 45.0 mmHg 35.9 40.5 42.0 - -   HCO3 20.0 - 24.0 mEq/L 24.2(H) 25.1(H) 23.5 - -   TCO2 0 - 100 mmol/L _0 ACIDBASEDEF 0.0 - 2.0 mmol/L - - 2.0 - -   O2SAT - 99.0 99.0 98.0 - -  Exercise Target Goals:    Exercise Program Goal: Individual exercise prescription set with THRR, safety & activity barriers. Participant demonstrates ability to understand and report RPE using BORG scale, to self-measure pulse accurately, and to acknowledge the importance of the exercise prescription.  Exercise Prescription Goal: Starting with aerobic activity 30 plus minutes a day, 3 days per week for initial exercise prescription. Provide home exercise prescription and guidelines that participant acknowledges understanding prior to discharge.  Activity Barriers & Risk Stratification:     Activity Barriers & Cardiac Risk Stratification - 03/08/15 1501    Activity Barriers & Cardiac Risk Stratification   Activity Barriers None   Cardiac Risk Stratification High      6 Minute Walk:     6 Minute Walk      03/08/15 1411       6 Minute Walk   Phase Initial     Distance 1700 feet     Walk Time 6 minutes      # of Rest Breaks 0     MPH 3.2     RPE 8     Resting HR 78 bpm     Resting BP 128/80 mmHg     Max Ex. HR 110 bpm     Max Ex. BP 122/68 mmHg     Pre/Post BP   Pre/Post BP? --        Initial Exercise Prescription:     Initial Exercise Prescription - 03/08/15 1400    Date of Initial Exercise RX and Referring Provider   Date 03/08/15   Treadmill   MPH 3   Grade 0   Minutes 20   Bike   Level 0.4   Watts 15   Minutes 20   Recumbant Bike   Level 6   RPM 50   Watts 50   Minutes 20   NuStep   Level 4   Watts 55   Minutes 20   Arm Ergometer   Level 1   Watts 10   Minutes 15   Arm/Foot Ergometer   Level 4   Watts 15   Minutes 15   Cybex   Level 4   RPM 60   Minutes 20   Recumbant Elliptical   Level 2   RPM 40   Watts 20   Minutes 20   Elliptical   Level 1   Speed 3   Minutes 5   REL-XR   Level 3   Watts 50   Minutes 20   T5 Nustep   Level 3   Watts 30   Minutes 20   Biostep-RELP   Level 5   Watts 40   Minutes 20   Prescription Details   Frequency (times per week) 3   Duration Progress to 50 minutes of aerobic without signs/symptoms of physical distress   Intensity   THRR REST +  30   Ratings of Perceived Exertion 11-15   Progression   Progression Continue progressive overload as per policy without signs/symptoms or physical distress.   Resistance Training   Training Prescription Yes   Weight 4   Reps 10-15      Perform Capillary Blood Glucose checks as needed.  Exercise Prescription Changes:     Exercise Prescription Changes      03/17/15 0900 04/02/15 0900 05/07/15 1000 06/09/15 0636     Exercise Review   Progression  Yes Yes Yes    Response to Exercise   Blood Pressure (Admit)  142/82 mmHg  128/78 mmHg 152/88 mmHg    Blood Pressure (Exercise)  140/80 mmHg 144/78 mmHg 168/80 mmHg    Blood Pressure (Exit)  112/72 mmHg 138/70 mmHg 112/74 mmHg    Heart Rate (Admit)  79 bpm -- 82 bpm    Heart Rate (Exercise)  105 bpm 107 bpm 119  bpm    Heart Rate (Exit)  91 bpm 93 bpm 84 bpm    Rating of Perceived Exertion (Exercise)  _0 Symptoms None None None none    Comments First day of exercise! Patient was oriented to the gym and the equipment functions and settings. Procedures and policies of the gym were outlined and explained. The patient's individual exercise prescription and treatment plan were reviewed with them. All starting workloads were established based on the results of the functional testing  done at the initial intake visit. The plan for exercise progression was also introduced and progression will be customized based on the patient's performance and goals.  Reviewed individualized exercise prescription and made increases per departmental policy. Exercise increases were discussed with the patient and they were able to perform the new work loads without issue (no signs or symptoms).  --     Duration Progress to 30 minutes of continuous aerobic without signs/symptoms of physical distress Progress to 30 minutes of continuous aerobic without signs/symptoms of physical distress Progress to 45 minutes of aerobic exercise without signs/symptoms of physical distress Progress to 45 minutes of aerobic exercise without signs/symptoms of physical distress    Intensity Rest + 30 Rest + 30 Rest + 30 Rest + 30    Progression   Progression Continue progressive overload as per policy without signs/symptoms or physical distress. Continue progressive overload as per policy without signs/symptoms or physical distress. Continue progressive overload as per policy without signs/symptoms or physical distress. Continue progressive overload as per policy without signs/symptoms or physical distress.    Resistance Training   Training Prescription  Yes Yes Yes    Weight  _1 Reps  10-15 10-15 10-15    Training Prescription (read-only) Yes       Weight (read-only) 4       Reps (read-only) 10-15       Treadmill   MPH  3.6 3.8 3.8     Grade  1 1 3.5  elevation ranges from 3-3.5    Minutes  5 45 23    MPH (read-only) 2       Grade (read-only) 0       Minutes (read-only) 20          Exercise Comments:     Exercise Comments      05/19/15 0622 06/16/15 1914         Exercise Comments Juan [name] has nade increases in intensity and duration on the TM since starting the program. He now aims for 45-50 min on the TM as time allows in class, however he has been absent from class for 2 weeks and may need to lower his intensity or duration once he comes back to class after an extended absense.  Juan [name] has made some slight increases in his workload intensities on the TM. His vital signs have all been acceptable during exercise and he is tolerating his exercise well when he comes to class. He has not been attending regularly which will effect his progression.          Discharge Exercise Prescription (Final Exercise Prescription Changes):  Exercise Prescription Changes - 06/09/15 0636    Exercise Review   Progression Yes   Response to Exercise   Blood Pressure (Admit) 152/88 mmHg   Blood Pressure (Exercise) 168/80 mmHg   Blood Pressure (Exit) 112/74 mmHg   Heart Rate (Admit) 82 bpm   Heart Rate (Exercise) 119 bpm   Heart Rate (Exit) 84 bpm   Rating of Perceived Exertion (Exercise) 12   Symptoms none   Duration Progress to 45 minutes of aerobic exercise without signs/symptoms of physical distress   Intensity Rest + 30   Progression   Progression Continue progressive overload as per policy without signs/symptoms or physical distress.   Resistance Training   Training Prescription Yes   Weight 5   Reps 10-15   Treadmill   MPH 3.8   Grade 3.5  elevation ranges from 3-3.5   Minutes 45      Nutrition:  Target Goals: Understanding of nutrition guidelines, daily intake of sodium <1517m, cholesterol <2058m calories 30% from fat and 7% or less from saturated fats, daily to have 5 or more servings of fruits and  vegetables.  Biometrics:     Pre Biometrics - 03/08/15 1410    Pre Biometrics   Height 5' 7.6" (1.717 m)   Weight 195 lb 9.6 oz (88.724 kg)   Waist Circumference 40.5 inches   Hip Circumference 38.25 inches   Waist to Hip Ratio 1.06 %   BMI (Calculated) 30.2       Nutrition Therapy Plan and Nutrition Goals:     Nutrition Therapy & Goals - 03/08/15 1505    Nutrition Therapy   Drug/Food Interactions Statins/Certain Fruits   Personal Nutrition Goals   Personal Goal #1 Decrease sodium. (Juan [name] said he has already met with a Registered Dietician that Dr. GoRockey Siturranged so he feels he doesn't need to meet individually with our Cardiac Rehab Registered Dietician. )   Personal Goal #2 Limit saturated fats.   Personal Goal #3 Eat fruit with the skin on it ie peaches, apples but nothing in cans is what he previous dietician taught Juan [name] he reprots.    Intervention Plan   Intervention (read-only) Using nutrition plan and personal goals to gain a healthy nutrition lifestyle. Add exercise as prescribed.      Nutrition Discharge: Rate Your Plate Scores:   Nutrition Goals Re-Evaluation:     Nutrition Goals Re-Evaluation      04/02/15 0908           Personal Goal #1 Re-Evaluation   Goal Progress Seen Yes       Comments Juan [name] said again today he doesn't feel like he needs to meet individually with a registered dietician since he knows what to do.        Personal Goal #2 Re-Evaluation   Goal Progress Seen Yes       Personal Goal #3 Re-Evaluation   Goal Progress Seen Yes          Psychosocial: Target Goals: Acknowledge presence or absence of depression, maximize coping skills, provide positive support system. Participant is able to verbalize types and ability to use techniques and skills needed for reducing stress and depression.  Initial Review & Psychosocial Screening:     Initial Psych Review & Screening - 03/08/15 [place]    Comments Juan [name] to pay a $3000 deductible for the new 2017 so he said he may not attend all 36 sessions since each session  will be an out of pocket expense.    Barriers   Psychosocial barriers to participate in program There are no identifiable barriers or psychosocial needs.   Screening Interventions   Interventions Encouraged to exercise      Quality of Life Scores:     Quality of Life - 03/08/15 1539    Quality of Life Scores   Health/Function Pre 26 %   Socioeconomic Pre 27.86 %   Psych/Spiritual Pre 28.29 %   Family Pre 30 %   GLOBAL Pre 27.44 %      PHQ-9:     Recent Review Flowsheet Data    Depression screen [facility] 2/9 03/08/2015   Decreased Interest 0   Down, Depressed, Hopeless 0   PHQ - 2 Score 0   Altered sleeping 2   Tired, decreased energy 0   Change in appetite 0   Feeling bad or failure about yourself  0   Trouble concentrating 0   Moving slowly or fidgety/restless 0   PHQ-9 Score 2   Difficult doing work/chores Not difficult at all      Psychosocial Evaluation and Intervention:     Psychosocial Evaluation - 04/21/15 0945    Psychosocial Evaluation & Interventions   Interventions Encouraged to exercise with the program and follow exercise prescription;Stress management education   Comments Counselor met with Juan [name] today for initial psychosocial evaluation.  He is a 60 year old well-adjusted gentleman who had bypass surgery on Dec. 30, 2016.  He has a strong support system with a spouse of 75 years and adult children who live close by.  He is also actively involved in his local church.  Juan [name] reports he sleeps well; has a good appetite and denies a history of or current symptoms of anxiety or depression.  He states his mood is typically positive and he has minimal stress other than some work related issues.  Juan [name] has goals to be more educated and increase his stamina and strength while in this program.  He has home gym equipment and walks  outside typically up to one hour each day so plans to maintain this following completion of this program.     Continued Psychosocial Services Needed Yes  Juan [name] will benefit from the psychoeducational components of this program - particularly stress management.        Psychosocial Re-Evaluation:     Psychosocial Re-Evaluation      04/02/15 0910 06/16/15 1005 06/16/15 1006       Psychosocial Re-Evaluation   Interventions Encouraged to attend Cardiac Rehabilitation for the exercise       Comments Juan [name] said he feel everything is going well in Cardiac Rehab for him and he has been able to attend frequently.  Follow up with Juan [name] today reporting feeling stronger and more stamina since coming into this program.  He also reports he has purchased a tread mill and is working out at home on days that he cannot be here which is helping as well.  Juan [name] Follow up with Juan [name] today reporting feeling stronger and more stamina since coming into this program.  He also reports he has purchased a tread mill and is working out at home on days that he cannot be here which is helping as well.  Juan [name] continues to have a positive mood and minimal stress - other than work at times.          Vocational Rehabilitation: Provide vocational rehab assistance to  qualifying candidates.   Vocational Rehab Evaluation & Intervention:     Vocational Rehab - 03/08/15 1503    Initial Vocational Rehab Evaluation & Intervention   Assessment shows need for Vocational Rehabilitation No      Education: Education Goals: Education classes will be provided on a weekly basis, covering required topics. Participant will state understanding/return demonstration of topics presented.  Learning Barriers/Preferences:     Learning Barriers/Preferences - 03/08/15 1502    Learning Barriers/Preferences   Learning Barriers None   Learning Preferences None      Education Topics: General Nutrition Guidelines/Fats  and Fiber: -Group instruction provided by verbal, written material, models and posters to present the general guidelines for heart healthy nutrition. Gives an explanation and review of dietary fats and fiber.   Controlling Sodium/Reading Food Labels: -Group verbal and written material supporting the discussion of sodium use in heart healthy nutrition. Review and explanation with models, verbal and written materials for utilization of the food label.   Exercise Physiology & Risk Factors: - Group verbal and written instruction with models to review the exercise physiology of the cardiovascular system and associated critical values. Details cardiovascular disease risk factors and the goals associated with each risk factor.   Aerobic Exercise & Resistance Training: - Gives group verbal and written discussion on the health impact of inactivity. On the components of aerobic and resistive training programs and the benefits of this training and how to safely progress through these programs.   Flexibility, Balance, General Exercise Guidelines: - Provides group verbal and written instruction on the benefits of flexibility and balance training programs. Provides general exercise guidelines with specific guidelines to those with heart or lung disease. Demonstration and skill practice provided.          Cardiac Rehab from 06/16/2015 in [facility] Cardiac and Pulmonary Rehab   Date  05/26/15   Educator  bs   Instruction Review Code  2- meets goals/outcomes      Stress Management: - Provides group verbal and written instruction about the health risks of elevated stress, cause of high stress, and healthy ways to reduce stress.   Depression: - Provides group verbal and written instruction on the correlation between heart/lung disease and depressed mood, treatment options, and the stigmas associated with seeking treatment.      Cardiac Rehab from 06/16/2015 in [facility] Cardiac and Pulmonary Rehab   Date   03/24/15   Educator  [facility]   Instruction Review Code  2- meets goals/outcomes      Anatomy & Physiology of the Heart: - Group verbal and written instruction and models provide basic cardiac anatomy and physiology, with the coronary electrical and arterial systems. Review of: AMI, Angina, Valve disease, Heart Failure, Cardiac Arrhythmia, Pacemakers, and the ICD.   Cardiac Procedures: - Group verbal and written instruction and models to describe the testing methods done to diagnose heart disease. Reviews the outcomes of the test results. Describes the treatment choices: Medical Management, Angioplasty, or Coronary Bypass Surgery.   Cardiac Medications: - Group verbal and written instruction to review commonly prescribed medications for heart disease. Reviews the medication, class of the drug, and side effects. Includes the steps to properly store meds and maintain the prescription regimen.      Cardiac Rehab from 06/16/2015 in [facility] Cardiac and Pulmonary Rehab   Date  06/16/15   Educator  SB   Instruction Review Code  2- meets goals/outcomes [Part II:  Cardiac Medications]      Go Sex-Intimacy &  Heart Disease, Get SMART - Goal Setting: - Group verbal and written instruction through game format to discuss heart disease and the return to sexual intimacy. Provides group verbal and written material to discuss and apply goal setting through the application of the S.M.A.R.T. Method.   Other Matters of the Heart: - Provides group verbal, written materials and models to describe Heart Failure, Angina, Valve Disease, and Diabetes in the realm of heart disease. Includes description of the disease process and treatment options available to the cardiac patient.   Exercise & Equipment Safety: - Individual verbal instruction and demonstration of equipment use and safety with use of the equipment.      Cardiac Rehab from 06/16/2015 in [facility] Cardiac and Pulmonary Rehab   Date  03/08/15   Educator  C.  EnterkinRN   Instruction Review Code  1- partially meets, needs review/practice      Infection Prevention: - Provides verbal and written material to individual with discussion of infection control including proper hand washing and proper equipment cleaning during exercise session.      Cardiac Rehab from 06/16/2015 in [facility] Cardiac and Pulmonary Rehab   Date  03/08/15   Educator  C. EnterkinRN   Instruction Review Code  2- meets goals/outcomes      Falls Prevention: - Provides verbal and written material to individual with discussion of falls prevention and safety.      Cardiac Rehab from 06/16/2015 in [facility] Cardiac and Pulmonary Rehab   Date  03/08/15   Educator  C. [place]   Instruction Review Code  2- meets goals/outcomes      Diabetes: - Individual verbal and written instruction to review signs/symptoms of diabetes, desired ranges of glucose level fasting, after meals and with exercise. Advice that pre and post exercise glucose checks will be done for 3 sessions at entry of program.      Cardiac Rehab from 06/16/2015 in [facility] Cardiac and Pulmonary Rehab   Date  03/08/15   Educator  Loletha Grayer ENterkinRN   Instruction Review Code  1- partially meets, needs review/practice       Knowledge Questionnaire Score:     Knowledge Questionnaire Score - 03/08/15 1502    Knowledge Questionnaire Score   Pre Score 26      Core Components/Risk Factors/Patient Goals at Admission:     Personal Goals and Risk Factors at Admission - 03/08/15 1507    Core Components/Risk Factors/Patient Goals on Admission    Weight Management Yes   Intervention (read-only) Learn and follow the exercise and diet guidelines while in the program. Utilize the nutrition and education classes to help gain knowledge of the diet and exercise expectations in the program   Admit Weight 195 lb (88.451 kg)   Goal Weight: Short Term 170 lb (77.111 kg)  1 year ago Juan [name] was 230lbs   Sedentary Yes   Intervention (read-only)  While in program, learn and follow the exercise prescription taught. Start at a low level workload and increase workload after able to maintain previous level for 30 minutes. Increase time before increasing intensity.   Goal Blood glucose control identified by blood glucose values, HgbA1C. Participant verbalizes understanding of the signs/symptoms of hyper/hypo glycemia, proper foot care and importance of medication and nutrition plan for blood glucose control.   Intervention (read-only) Provide nutrition & aerobic exercise along with prescribed medications to achieve blood glucose in normal ranges: Fasting 65-99 mg/dL   Hypertension Yes   Goal Participant will see blood pressure controlled within the  values of 140/53m/Hg or within value directed by their physician.   Intervention (read-only) Provide nutrition & aerobic exercise along with prescribed medications to achieve BP 140/90 or less.   Lipids Yes   Goal Cholesterol controlled with medications as prescribed, with individualized exercise RX and with personalized nutrition plan. Value goals: LDL < 752m HDL > 4067mParticipant states understanding of desired cholesterol values and following prescriptions.   Intervention (read-only) Provide nutrition & aerobic exercise along with prescribed medications to achieve LDL <74m64mDL >40mg37mTake Less Medication Yes   Intervention Learn your risk factors and begin the lifestyle modifications for risk factor control during your time in the program.      Core Components/Risk Factors/Patient Goals Review:      Goals and Risk Factor Review      04/02/15 0909 06/16/15 0935 06/16/15 0952 [id number]  Core Components/Risk Factors/Patient Goals Review   Personal Goals Review Diabetes;Hypertension;Lipids Diabetes;Hypertension;Lipids;Weight Management/Obesity Weight Management/Obesity     Review Juan [name] reports his HBA1c was done yesterday adn uch better down to 6.2 from he reported 11 in the hospital. He reported  his cholestrol level yestersay was 133. His blood pressure is 140/80 walking on the treadmill  at 3.5mph.60me's goal for his weight is 190 pounds or less.  Juan [name] states his FSBS this a.m. was 140.  He reports it being higher now that he has returned to work and is traveling.  He is working on better food sTherapist, nutritional traveling.  He states he eats a lot of salads and soups.  He also stated balsamic vinegarette dressings is not without sugar nor is low fat ranch.  The best dressing he has found for his diabetes is oil and vinegar.  BP  during check in to Cardiac  Rehab  today was 132/76.  BP during workout on NS was 132/70.  Juan [name] reports cholesterol check from  March 17 was around 156.  Juan [name] states being in Cardiac Rehab  program has helped him to "get off his butt" and start moving.  He and his wife purchased a treamill for his home and he is now using that at home.  Finally, Juan [name] informed RN that he did not think he would learn anything from the Cardiac Rehab education sessions, but he stated, "I have learned something from every session."        Expected Outcomes  Continue Cardiac Rehab program; continue exercise prescription; continue cholesterol and BP medications as prescribed; continue exercising at home in addition to Cardiac Rehab; continue with low cholesterol low fat diabetic diet to achieve heart healthy lifestyle and overall weight goals.           Core Components/Risk Factors/Patient Goals at Discharge (Final Review):      Goals and Risk Factor Review - 06/16/15 0952    Core Components/Risk Factors/Patient Goals Review   Personal Goals Review Weight Management/Obesity      ITP Comments:     ITP Comments      03/08/15 1511 03/23/15 0837 04/02/15 0911 04/22/15 1247 05/23/15 0926   ITP Comments Decrease sodium. (Juan [name] said he has already met with a Registered Dietician that Dr. GollanRockey Situged so he feels he doesn't need to meet individually with our Cardiac Rehab Registered [place]  already walks 7 days a week for 60 minutes at a time for exercise. Juan [name] reports he had a heart attack in his 30s an23shas a total of 10 stents. He had open  heart surgery x 4 in 2016. Juan [name] showed me where they tried to take a  vein for a graft in his right lower leg and sometimes he has right leg numbness which the MD knows about. Juan [name] reports the numbness is less when he walks.  30 DAy Review. Continue with ITP.  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 He is able to walk on the treadmill today at 3.32mh  today with no problems.  30 Day Review. Continue with the ITP. 30 day review.  Continue with ITP  last visit 05/07/2015     06/20/15 1215           ITP Comments 30 day review. Continue with ITP.  2 visits since last review          Comments:

## 2015-06-30 ENCOUNTER — Encounter: Payer: BLUE CROSS/BLUE SHIELD | Admitting: *Deleted

## 2015-06-30 DIAGNOSIS — Z951 Presence of aortocoronary bypass graft: Secondary | ICD-10-CM

## 2015-06-30 NOTE — Progress Notes (Signed)
Daily Session Note  Patient Details  Name: Juan [name] MRN: [id number] Date of Birth: [birth year] Referring Provider:    Encounter Date: 06/30/2015  Check In:     Session Check In - 06/30/15 0834    Check-In   Location [facility]   Staff Present Nyoka Cowden, RN;Amanda Oletta Darter, BA, ACSM CEP, Exercise Physiologist;Naidelyn Parrella Luan Pulling, MA, ACSM RCEP, Exercise Physiologist;Diane Joya Gaskins, RN, BSN   Supervising physician immediately available to respond to emergencies See telemetry face sheet for immediately available ER MD   Medication changes reported     No   Fall or balance concerns reported    No   Warm-up and Cool-down Performed on first and last piece of equipment   Resistance Training Performed Yes   VAD Patient? No   Pain Assessment   Currently in Pain? No/denies   Multiple Pain Sites No         Goals Met:  Independence with exercise equipment Exercise tolerated well No report of cardiac concerns or symptoms Strength training completed today  Goals Unmet:  Not Applicable  Comments: Pt able to follow exercise prescription today without complaint.  Will continue to monitor for progression.    Dr. Emily Filbert is Medical Director for [facility].

## 2015-07-07 ENCOUNTER — Encounter: Payer: BLUE CROSS/BLUE SHIELD | Attending: Cardiovascular Disease

## 2015-07-07 DIAGNOSIS — Z951 Presence of aortocoronary bypass graft: Secondary | ICD-10-CM

## 2015-07-07 NOTE — Progress Notes (Signed)
Daily Session Note  Patient Details  Name: Jonn Chaikin MRN: [id number] Date of Birth: 07-05-1955 Referring Provider:    Encounter Date: 07/07/2015  Check In:     Session Check In - 07/07/15 0853    Check-In   Location [facility]   Staff Present Gerlene Burdock, RN, BSN;Jessica Luan Pulling, MA, ACSM RCEP, Exercise Physiologist;Dashonda Bonneau Oletta Darter, BA, ACSM CEP, Exercise Physiologist   Supervising physician immediately available to respond to emergencies See telemetry face sheet for immediately available ER MD   Medication changes reported     No   Fall or balance concerns reported    No   Warm-up and Cool-down Performed on first and last piece of equipment   Resistance Training Performed Yes   VAD Patient? No   Pain Assessment   Currently in Pain? No/denies   Multiple Pain Sites No         Goals Met:  Independence with exercise equipment Exercise tolerated well No report of cardiac concerns or symptoms Strength training completed today  Goals Unmet:  Not Applicable  Comments: Pt able to follow exercise prescription today without complaint.  Will continue to monitor for progression.   Dr. Emily Filbert is Medical Director for [facility].

## 2015-07-14 DIAGNOSIS — Z951 Presence of aortocoronary bypass graft: Secondary | ICD-10-CM | POA: Diagnosis not present

## 2015-07-14 NOTE — Progress Notes (Signed)
Daily Session Note  Patient Details  Name: Juan [name] MRN: [id number] Date of Birth: March 20, 1955 Referring Provider:    Encounter Date: 07/14/2015  Check In:     Session Check In - 07/14/15 0822    Check-In   Location [facility]   Staff Present Gerlene Burdock, RN, BSN;Jessica Luan Pulling, MA, ACSM RCEP, Exercise Physiologist;Liberti Appleton Oletta Darter, [place], ACSM CEP, Exercise Physiologist   Supervising physician immediately available to respond to emergencies See telemetry face sheet for immediately available ER MD   Medication changes reported     No   Fall or balance concerns reported    No   Warm-up and Cool-down Performed on first and last piece of equipment   Resistance Training Performed Yes   VAD Patient? No   Pain Assessment   Currently in Pain? No/denies   Multiple Pain Sites No         Goals Met:  Independence with exercise equipment Exercise tolerated well No report of cardiac concerns or symptoms Strength training completed today  Goals Unmet:  Not Applicable  Comments: Pt able to follow exercise prescription today without complaint.  Will continue to monitor for progression.    Dr. Emily Filbert is Medical Director for [facility].

## 2015-07-21 ENCOUNTER — Encounter: Payer: BLUE CROSS/BLUE SHIELD | Admitting: *Deleted

## 2015-07-21 ENCOUNTER — Encounter: Payer: Self-pay | Admitting: *Deleted

## 2015-07-21 DIAGNOSIS — Z951 Presence of aortocoronary bypass graft: Secondary | ICD-10-CM

## 2015-07-21 NOTE — Progress Notes (Signed)
Cardiac Individual Treatment Plan  Patient Details  Name: Juan [name] MRN: [id number] Date of Birth: Dec 26, 1955 Referring Provider:    Initial Encounter Date:       Cardiac Rehab from 03/08/2015 in [facility] Cardiac and Pulmonary Rehab   Date  03/08/15      Visit Diagnosis: S/P CABG x 4  Patient's Home Medications on Admission:  Current outpatient prescriptions:  .  aspirin 81 MG tablet, Take 81 mg by mouth daily., Disp: , Rfl:  .  clopidogrel (PLAVIX) 75 MG tablet, Take 1 tablet (75 mg total) by mouth daily., Disp: 30 tablet, Rfl: 3 .  folic acid (FOLVITE) 875 MCG tablet, Take 800 mcg by mouth daily. , Disp: , Rfl:  .  Insulin Glargine (LANTUS) 100 UNIT/ML Solostar Pen, Inject 24 Units into the skin daily at 10 pm. (Patient taking differently: Inject 20-22 Units into the skin daily at 10 pm. ), Disp: 15 mL, Rfl: 11 .  levothyroxine (SYNTHROID) 100 MCG tablet, Take 1 tablet (100 mcg total) by mouth daily before breakfast., Disp: 90 tablet, Rfl: 0 .  metFORMIN (GLUCOPHAGE-XR) 500 MG 24 hr tablet, Take 1 tablet (500 mg total) by mouth 2 (two) times daily with a meal., Disp: 60 tablet, Rfl: 1 .  metoprolol tartrate (LOPRESSOR) 50 MG tablet, Take 1 tablet (50 mg total) by mouth 2 (two) times daily., Disp: 180 tablet, Rfl: 3 .  Multiple Vitamin (MULTIVITAMIN) tablet, Take 1 tablet by mouth 2 (two) times daily. , Disp: , Rfl:  .  nitroGLYCERIN (NITROSTAT) 0.4 MG SL tablet, Place 0.4 mg under the tongue every 5 (five) minutes as needed for chest pain., Disp: , Rfl:  .  Omega-3 Fatty Acids (FISH OIL PO), Take 1,800 mg by mouth daily. , Disp: , Rfl:  .  ramipril (ALTACE) 5 MG capsule, Take 1 capsule (5 mg total) by mouth daily., Disp: 90 capsule, Rfl: 3 .  simvastatin (ZOCOR) 40 MG tablet, TAKE 1 BY MOUTH DAILY, Disp: 90 tablet, Rfl: 3  Past Medical History: Past Medical History  Diagnosis Date  . Essential hypertension   . Hyperlipidemia   . Coronary artery disease     a. Dating back to 18  w/ h/o MI.  8 stents;  b. 10/2003 Cath [place]: LM 20d, RI 40p, LAD 30p, 37m patent stent, D1 50p, LCX 30p, patent mid stent, 90d/diffuse, RCA dominant, 30p, patent stent, 475m, EF 44%-->Med Rx.  . Type II diabetes mellitus ([facility]  . Mild sleep apnea   . Hypothyroidism   . Myocardial infarction ([facility]1996    "small MI"   . Heart murmur     found during office visit  . Chronic kidney disease   . Asthma     pt. reports that he was treated for pnueumonia as a young child only    Tobacco Use: History  Smoking status  . Never Smoker   Smokeless tobacco  . Never Used    Labs: Recent Review Flowsheet Data    Labs for ITP Cardiac and Pulmonary Rehab Latest Ref Rng 01/29/2015 01/29/2015 01/29/2015 01/29/2015 01/30/2015   PHART 7.350 - 7.450 7.431 7.397 7.355 - -   PCO2ART 35.0 - 45.0 mmHg 35.9 40.5 42.0 - -   HCO3 20.0 - 24.0 mEq/L 24.2(H) 25.1(H) 23.5 - -   TCO2 0 - 100 mmol/L _0 ACIDBASEDEF 0.0 - 2.0 mmol/L - - 2.0 - -   O2SAT - 99.0 99.0 98.0 - -  Exercise Target Goals:    Exercise Program Goal: Individual exercise prescription set with THRR, safety & activity barriers. Participant demonstrates ability to understand and report RPE using BORG scale, to self-measure pulse accurately, and to acknowledge the importance of the exercise prescription.  Exercise Prescription Goal: Starting with aerobic activity 30 plus minutes a day, 3 days per week for initial exercise prescription. Provide home exercise prescription and guidelines that participant acknowledges understanding prior to discharge.  Activity Barriers & Risk Stratification:     Activity Barriers & Cardiac Risk Stratification - 03/08/15 1501    Activity Barriers & Cardiac Risk Stratification   Activity Barriers None   Cardiac Risk Stratification High      6 Minute Walk:     6 Minute Walk      03/08/15 1411       6 Minute Walk   Phase Initial     Distance 1700 feet     Walk Time 6 minutes      # of Rest Breaks 0     MPH 3.2     RPE 8     Resting HR 78 bpm     Resting BP 128/80 mmHg     Max Ex. HR 110 bpm     Max Ex. BP 122/68 mmHg     Pre/Post BP   Pre/Post BP? --        Initial Exercise Prescription:     Initial Exercise Prescription - 03/08/15 1400    Date of Initial Exercise RX and Referring Provider   Date 03/08/15   Treadmill   MPH 3   Grade 0   Minutes 20   Bike   Level 0.4   Watts 15   Minutes 20   Recumbant Bike   Level 6   RPM 50   Watts 50   Minutes 20   NuStep   Level 4   Watts 55   Minutes 20   Arm Ergometer   Level 1   Watts 10   Minutes 15   Arm/Foot Ergometer   Level 4   Watts 15   Minutes 15   Cybex   Level 4   RPM 60   Minutes 20   Recumbant Elliptical   Level 2   RPM 40   Watts 20   Minutes 20   Elliptical   Level 1   Speed 3   Minutes 5   REL-XR   Level 3   Watts 50   Minutes 20   T5 Nustep   Level 3   Watts 30   Minutes 20   Biostep-RELP   Level 5   Watts 40   Minutes 20   Prescription Details   Frequency (times per week) 3   Duration Progress to 50 minutes of aerobic without signs/symptoms of physical distress   Intensity   THRR REST +  30   Ratings of Perceived Exertion 11-15   Progression   Progression Continue progressive overload as per policy without signs/symptoms or physical distress.   Resistance Training   Training Prescription Yes   Weight 4   Reps 10-15      Perform Capillary Blood Glucose checks as needed.  Exercise Prescription Changes:     Exercise Prescription Changes      03/17/15 0900 04/02/15 0900 05/07/15 1000 06/09/15 0636 06/30/15 1200   Exercise Review   Progression  Yes Yes Yes Yes   Response to Exercise   Blood Pressure (Admit)  142/82 mmHg  128/78 mmHg 152/88 mmHg 126/78 mmHg   Blood Pressure (Exercise)  140/80 mmHg 144/78 mmHg 168/80 mmHg 132/62 mmHg   Blood Pressure (Exit)  112/72 mmHg 138/70 mmHg 112/74 mmHg 130/76 mmHg   Heart Rate (Admit)  79 bpm -- 82 bpm 88  bpm   Heart Rate (Exercise)  105 bpm 107 bpm 119 bpm 108 bpm   Heart Rate (Exit)  91 bpm 93 bpm 84 bpm 95 bpm   Rating of Perceived Exertion (Exercise)  '13 10 12 11   '$ Symptoms None None None none none   Comments First day of exercise! Patient was oriented to the gym and the equipment functions and settings. Procedures and policies of the gym were outlined and explained. The patient's individual exercise prescription and treatment plan were reviewed with them. All starting workloads were established based on the results of the functional testing  done at the initial intake visit. The plan for exercise progression was also introduced and progression will be customized based on the patient's performance and goals.  Reviewed individualized exercise prescription and made increases per departmental policy. Exercise increases were discussed with the patient and they were able to perform the new work loads without issue (no signs or symptoms).  --     Duration Progress to 30 minutes of continuous aerobic without signs/symptoms of physical distress Progress to 30 minutes of continuous aerobic without signs/symptoms of physical distress Progress to 45 minutes of aerobic exercise without signs/symptoms of physical distress Progress to 45 minutes of aerobic exercise without signs/symptoms of physical distress Progress to 30 minutes of continuous aerobic without signs/symptoms of physical distress   Intensity Rest + 30 Rest + 30 Rest + 30 Rest + 30 THRR New  113-146   Progression   Progression Continue progressive overload as per policy without signs/symptoms or physical distress. Continue progressive overload as per policy without signs/symptoms or physical distress. Continue progressive overload as per policy without signs/symptoms or physical distress. Continue progressive overload as per policy without signs/symptoms or physical distress. Continue to progress workloads to maintain intensity without signs/symptoms  of physical distress.   Average METs     5.25   Resistance Training   Training Prescription  Yes Yes Yes Yes   Weight  '5 5 5 10   '$ Reps  10-15 10-15 10-15 10-15   Training Prescription (read-only) Yes       Weight (read-only) 4       Reps (read-only) 10-15       Interval Training   Interval Training     Yes   Equipment     Treadmill   Comments     incline changes from 3.0-3.5   Treadmill   MPH  3.6 3.8 3.8 3.6   Grade  1 1 3.5  elevation ranges from 3-3.5 3   Minutes  40 45 45 35   MPH (read-only) 2       Grade (read-only) 0       Minutes (read-only) 20       NuStep   Level     4   Watts     103   Minutes     97     07/14/15 1100           Exercise Review   Progression Yes       Response to Exercise   Blood Pressure (Admit) 130/74 mmHg       Blood Pressure (Exercise) 158/62 mmHg       Blood  Pressure (Exit) 134/64 mmHg       Heart Rate (Admit) 77 bpm       Heart Rate (Exercise) 111 bpm       Heart Rate (Exit) 95 bpm       Rating of Perceived Exertion (Exercise) 12       Symptoms none       Duration Progress to 45 minutes of aerobic exercise without signs/symptoms of physical distress       Intensity THRR unchanged       Progression   Progression Continue to progress workloads to maintain intensity without signs/symptoms of physical distress.       Average METs 6.39       Resistance Training   Training Prescription Yes       Weight 10       Reps 10-15       Interval Training   Interval Training Yes       Equipment Treadmill       Comments incline changes from 3.0-3.5       Treadmill   MPH 4.4       Grade 3.5       Minutes 45       METs 6.49       Home Exercise Plan   Plans to continue exercise at Home  treadmil       Frequency Add 3 additional days to program exercise sessions.          Exercise Comments:     Exercise Comments      05/19/15 0622 06/16/15 0639 06/30/15 1253 07/14/15 1025     Exercise Comments Saverio has nade increases in intensity  and duration on the TM since starting the program. He now aims for 45-50 min on the TM as time allows in class, however he has been absent from class for 2 weeks and may need to lower his intensity or duration once he comes back to class after an extended absense.  Rohen has made some slight increases in his workload intensities on the TM. His vital signs have all been acceptable during exercise and he is tolerating his exercise well when he comes to class. He has not been attending regularly which will effect his progression.  [name]'s attendance has been sporadic.  He is using his treadmil at home.  He continues to make good progress on his exercise.  At next session, we will discuss increasing his intervals. [name] is up to 45-60 min on treadmill here in program and at home.  Today we talked about how his first few walks after surgery he could barely make.  He was pleased with how well he has comed during the program.       Discharge Exercise Prescription (Final Exercise Prescription Changes):     Exercise Prescription Changes - 07/14/15 1100    Exercise Review   Progression Yes   Response to Exercise   Blood Pressure (Admit) 130/74 mmHg   Blood Pressure (Exercise) 158/62 mmHg   Blood Pressure (Exit) 134/64 mmHg   Heart Rate (Admit) 77 bpm   Heart Rate (Exercise) 111 bpm   Heart Rate (Exit) 95 bpm   Rating of Perceived Exertion (Exercise) 12   Symptoms none   Duration Progress to 45 minutes of aerobic exercise without signs/symptoms of physical distress   Intensity THRR unchanged   Progression   Progression Continue to progress workloads to maintain intensity without signs/symptoms of physical distress.   Average METs 6.39  Resistance Training   Training Prescription Yes   Weight 10   Reps 10-15   Interval Training   Interval Training Yes   Equipment Treadmill   Comments incline changes from 3.0-3.5   Treadmill   MPH 4.4   Grade 3.5   Minutes 45   METs 6.49   Home Exercise Plan    Plans to continue exercise at Home  treadmil   Frequency Add 3 additional days to program exercise sessions.      Nutrition:  Target Goals: Understanding of nutrition guidelines, daily intake of sodium '1500mg'$ , cholesterol '200mg'$ , calories 30% from fat and 7% or less from saturated fats, daily to have 5 or more servings of fruits and vegetables.  Biometrics:     Pre Biometrics - 03/08/15 1410    Pre Biometrics   Height 5' 7.6" (1.717 m)   Weight 195 lb 9.6 oz (88.724 kg)   Waist Circumference 40.5 inches   Hip Circumference 38.25 inches   Waist to Hip Ratio 1.06 %   BMI (Calculated) 30.2       Nutrition Therapy Plan and Nutrition Goals:     Nutrition Therapy & Goals - 03/08/15 1505    Nutrition Therapy   Drug/Food Interactions Statins/Certain Fruits   Personal Nutrition Goals   Personal Goal #1 Decrease sodium. ([name] said he has already met with a Registered Dietician that Dr. Rockey Situ arranged so he feels he doesn't need to meet individually with our Cardiac Rehab Registered Dietician. )   Personal Goal #2 Limit saturated fats.   Personal Goal #3 Eat fruit with the skin on it ie peaches, apples but nothing in cans is what he previous dietician taught [name] he reprots.    Intervention Plan   Intervention (read-only) Using nutrition plan and personal goals to gain a healthy nutrition lifestyle. Add exercise as prescribed.      Nutrition Discharge: Rate Your Plate Scores:   Nutrition Goals Re-Evaluation:     Nutrition Goals Re-Evaluation      04/02/15 0908           Personal Goal #1 Re-Evaluation   Goal Progress Seen Yes       Comments Jospeh said again today he doesn't feel like he needs to meet individually with a registered dietician since he knows what to do.        Personal Goal #2 Re-Evaluation   Goal Progress Seen Yes       Personal Goal #3 Re-Evaluation   Goal Progress Seen Yes          Psychosocial: Target Goals: Acknowledge presence or absence of  depression, maximize coping skills, provide positive support system. Participant is able to verbalize types and ability to use techniques and skills needed for reducing stress and depression.  Initial Review & Psychosocial Screening:     Initial Psych Review & Screening - 03/08/15 [place]? Yes   Comments [name] has to pay a $3000 deductible for the new 2017 so he said he may not attend all 36 sessions since each session will be an out of pocket expense.    Barriers   Psychosocial barriers to participate in program There are no identifiable barriers or psychosocial needs.   Screening Interventions   Interventions Encouraged to exercise      Quality of Life Scores:     Quality of Life - 03/08/15 1539    Quality of Life Scores   Health/Function Pre 26 %  Socioeconomic Pre 27.86 %   Psych/Spiritual Pre 28.29 %   Family Pre 30 %   GLOBAL Pre 27.44 %      PHQ-9:     Recent Review Flowsheet Data    [facility]   Decreased Interest 0   Down, Depressed, Hopeless 0   PHQ - 2 Score 0   Altered sleeping 2   Tired, decreased energy 0   Change in appetite 0   Feeling bad or failure about yourself  0   Trouble concentrating 0   Moving slowly or fidgety/restless 0   PHQ-9 Score 2   Difficult doing work/chores Not difficult at all      Psychosocial Evaluation and Intervention:     Psychosocial Evaluation - 04/21/15 0945    Psychosocial Evaluation & Interventions   Interventions Encouraged to exercise with the program and follow exercise prescription;Stress management education   Comments Counselor met with Mr. Graziani today for initial psychosocial evaluation.  He is a 60 year old well-adjusted gentleman who had bypass surgery on Dec. 30, 2016.  He has a strong support system with a spouse of 34 years and adult children who live close by.  He is also actively involved in his local church.  Mr. Bordas reports he sleeps  well; has a good appetite and denies a history of or current symptoms of anxiety or depression.  He states his mood is typically positive and he has minimal stress other than some work related issues.  Mr. Nicodemus has goals to be more educated and increase his stamina and strength while in this program.  He has home gym equipment and walks outside typically up to one hour each day so plans to maintain this following completion of this program.     Continued Psychosocial Services Needed Yes  Mr. Abril will benefit from the psychoeducational components of this program - particularly stress management.        Psychosocial Re-Evaluation:     Psychosocial Re-Evaluation      04/02/15 0910 06/16/15 1005 06/16/15 1006       Psychosocial Re-Evaluation   Interventions Encouraged to attend Cardiac Rehabilitation for the exercise       Comments [name] said he feel everything is going well in Cardiac Rehab for him and he has been able to attend frequently.  Follow up with Mr. Schreiner today reporting feeling stronger and more stamina since coming into this program.  He also reports he has purchased a tread mill and is working out at home on days that he cannot be here which is helping as well.  Mr. Delane Ginger Follow up with Mr. Hulse today reporting feeling stronger and more stamina since coming into this program.  He also reports he has purchased a tread mill and is working out at home on days that he cannot be here which is helping as well.  Mr. Delane Ginger continues to have a positive mood and minimal stress - other than work at times.          Vocational Rehabilitation: Provide vocational rehab assistance to qualifying candidates.   Vocational Rehab Evaluation & Intervention:     Vocational Rehab - 03/08/15 1503    Initial Vocational Rehab Evaluation & Intervention   Assessment shows need for Vocational Rehabilitation No      Education: Education Goals: Education classes will be provided on a weekly basis,  covering required topics. Participant will state understanding/return demonstration of topics presented.  Learning Barriers/Preferences:  Learning Barriers/Preferences - 03/08/15 1502    Learning Barriers/Preferences   Learning Barriers None   Learning Preferences None      Education Topics: General Nutrition Guidelines/Fats and Fiber: -Group instruction provided by verbal, written material, models and posters to present the general guidelines for heart healthy nutrition. Gives an explanation and review of dietary fats and fiber.   Controlling Sodium/Reading Food Labels: -Group verbal and written material supporting the discussion of sodium use in heart healthy nutrition. Review and explanation with models, verbal and written materials for utilization of the food label.   Exercise Physiology & Risk Factors: - Group verbal and written instruction with models to review the exercise physiology of the cardiovascular system and associated critical values. Details cardiovascular disease risk factors and the goals associated with each risk factor.          Cardiac Rehab from 07/14/2015 in [facility] Cardiac and Pulmonary Rehab   Date  07/14/15   Educator  [facility]   Instruction Review Code  2- meets goals/outcomes      Aerobic Exercise & Resistance Training: - Gives group verbal and written discussion on the health impact of inactivity. On the components of aerobic and resistive training programs and the benefits of this training and how to safely progress through these programs.   Flexibility, Balance, General Exercise Guidelines: - Provides group verbal and written instruction on the benefits of flexibility and balance training programs. Provides general exercise guidelines with specific guidelines to those with heart or lung disease. Demonstration and skill practice provided.      Cardiac Rehab from 07/14/2015 in [facility] Cardiac and Pulmonary Rehab   Date  05/26/15   Educator  bs   Instruction  Review Code  2- meets goals/outcomes      Stress Management: - Provides group verbal and written instruction about the health risks of elevated stress, cause of high stress, and healthy ways to reduce stress.   Depression: - Provides group verbal and written instruction on the correlation between heart/lung disease and depressed mood, treatment options, and the stigmas associated with seeking treatment.      Cardiac Rehab from 07/14/2015 in [facility] Cardiac and Pulmonary Rehab   Date  06/30/15 [Second Class]   Educator  [facility]   Instruction Review Code  R- Review/reinforce      Anatomy & Physiology of the Heart: - Group verbal and written instruction and models provide basic cardiac anatomy and physiology, with the coronary electrical and arterial systems. Review of: AMI, Angina, Valve disease, Heart Failure, Cardiac Arrhythmia, Pacemakers, and the ICD.   Cardiac Procedures: - Group verbal and written instruction and models to describe the testing methods done to diagnose heart disease. Reviews the outcomes of the test results. Describes the treatment choices: Medical Management, Angioplasty, or Coronary Bypass Surgery.   Cardiac Medications: - Group verbal and written instruction to review commonly prescribed medications for heart disease. Reviews the medication, class of the drug, and side effects. Includes the steps to properly store meds and maintain the prescription regimen.      Cardiac Rehab from 07/14/2015 in [facility] Cardiac and Pulmonary Rehab   Date  06/16/15   Educator  SB   Instruction Review Code  2- meets goals/outcomes [Part II:  Cardiac Medications]      Go Sex-Intimacy & Heart Disease, Get SMART - Goal Setting: - Group verbal and written instruction through game format to discuss heart disease and the return to sexual intimacy. Provides group verbal and written material to discuss  and apply goal setting through the application of the S.M.A.R.T. Method.   Other Matters of the  Heart: - Provides group verbal, written materials and models to describe Heart Failure, Angina, Valve Disease, and Diabetes in the realm of heart disease. Includes description of the disease process and treatment options available to the cardiac patient.   Exercise & Equipment Safety: - Individual verbal instruction and demonstration of equipment use and safety with use of the equipment.      Cardiac Rehab from 07/14/2015 in [facility] Cardiac and Pulmonary Rehab   Date  03/08/15   Educator  C. EnterkinRN   Instruction Review Code  1- partially meets, needs review/practice      Infection Prevention: - Provides verbal and written material to individual with discussion of infection control including proper hand washing and proper equipment cleaning during exercise session.      Cardiac Rehab from 07/14/2015 in [facility] Cardiac and Pulmonary Rehab   Date  03/08/15   Educator  C. EnterkinRN   Instruction Review Code  2- meets goals/outcomes      Falls Prevention: - Provides verbal and written material to individual with discussion of falls prevention and safety.      Cardiac Rehab from 07/14/2015 in [facility] Cardiac and Pulmonary Rehab   Date  03/08/15   Educator  C. Berwind   Instruction Review Code  2- meets goals/outcomes      Diabetes: - Individual verbal and written instruction to review signs/symptoms of diabetes, desired ranges of glucose level fasting, after meals and with exercise. Advice that pre and post exercise glucose checks will be done for 3 sessions at entry of program.      Cardiac Rehab from 07/14/2015 in [facility] Cardiac and Pulmonary Rehab   Date  03/08/15   Educator  Loletha Grayer ENterkinRN   Instruction Review Code  1- partially meets, needs review/practice       Knowledge Questionnaire Score:     Knowledge Questionnaire Score - 03/08/15 1502    Knowledge Questionnaire Score   Pre Score 26      Core Components/Risk Factors/Patient Goals at Admission:     Personal Goals and  Risk Factors at Admission - 03/08/15 1507    Core Components/Risk Factors/Patient Goals on Admission    Weight Management Yes   Intervention (read-only) Learn and follow the exercise and diet guidelines while in the program. Utilize the nutrition and education classes to help gain knowledge of the diet and exercise expectations in the program   Admit Weight 195 lb (88.451 kg)   Goal Weight: Short Term 170 lb (77.111 kg)  1 year ago [name] was 230lbs   Sedentary Yes   Intervention (read-only) While in program, learn and follow the exercise prescription taught. Start at a low level workload and increase workload after able to maintain previous level for 30 minutes. Increase time before increasing intensity.   Goal Blood glucose control identified by blood glucose values, HgbA1C. Participant verbalizes understanding of the signs/symptoms of hyper/hypo glycemia, proper foot care and importance of medication and nutrition plan for blood glucose control.   Intervention (read-only) Provide nutrition & aerobic exercise along with prescribed medications to achieve blood glucose in normal ranges: Fasting 65-99 mg/dL   Hypertension Yes   Goal Participant will see blood pressure controlled within the values of 140/63m/Hg or within value directed by their physician.   Intervention (read-only) Provide nutrition & aerobic exercise along with prescribed medications to achieve BP 140/90 or less.   Lipids Yes  Goal Cholesterol controlled with medications as prescribed, with individualized exercise RX and with personalized nutrition plan. Value goals: LDL < '70mg'$ , HDL > '40mg'$ . Participant states understanding of desired cholesterol values and following prescriptions.   Intervention (read-only) Provide nutrition & aerobic exercise along with prescribed medications to achieve LDL '70mg'$ , HDL >'40mg'$ .   Take Less Medication Yes   Intervention Learn your risk factors and begin the lifestyle modifications for risk factor  control during your time in the program.      Core Components/Risk Factors/Patient Goals Review:      Goals and Risk Factor Review      04/02/15 0909 06/16/15 0935 06/16/15 0952 07/14/15 1021     Core Components/Risk Factors/Patient Goals Review   Personal Goals Review Diabetes;Hypertension;Lipids Diabetes;Hypertension;Lipids;Weight Management/Obesity Weight Management/Obesity Hypertension;Lipids;Diabetes    Review [name] reports his HBA1c was done yesterday adn uch better down to 6.2 from he reported 11 in the hospital. He reported his cholestrol level yestersay was 133. His blood pressure is 140/80 walking on the treadmill  at 3.92mh. [name]'s goal for his weight is 190 pounds or less.  [name] states his FSBS this a.m. was 140.  He reports it being higher now that he has returned to work and is traveling.  He is working on better fTherapist, nutritionalwhile traveling.  He states he eats a lot of salads and soups.  He also stated balsamic vinegarette dressings is not without sugar nor is low fat ranch.  The best dressing he has found for his diabetes is oil and vinegar.  BP  during check in to Cardiac  Rehab  today was 132/76.  BP during workout on NS was 132/70.  [name] reports cholesterol check from  March 17 was around 156.  [name] states being in Cardiac Rehab  program has helped him to "get off his butt" and start moving.  He and his wife purchased a treamill for his home and he is now using that at home.  Finally, [name] informed RN that he did not think he would learn anything from the Cardiac Rehab education sessions, but he stated, "I have learned something from every session."    [name]'s blood sugars have been up recently but he is montioring them daily. He is taking his statins and having no problems. His blood pressures have been between 120-130/70-80 at rest at home and here in program.      Expected Outcomes  Continue Cardiac Rehab program; continue exercise prescription; continue cholesterol and BP medications as  prescribed; continue exercising at home in addition to Cardiac Rehab; continue with low cholesterol low fat diabetic diet to achieve heart healthy lifestyle and overall weight goals.    [name] will continue to see improvement in his numbers as he continues to exercise and make lifestyle changes.       Core Components/Risk Factors/Patient Goals at Discharge (Final Review):      Goals and Risk Factor Review - 07/14/15 1021    Core Components/Risk Factors/Patient Goals Review   Personal Goals Review Hypertension;Lipids;Diabetes   Review [name]'s blood sugars have been up recently but he is montioring them daily. He is taking his statins and having no problems. His blood pressures have been between 120-130/70-80 at rest at home and here in program.     Expected Outcomes [name] will continue to see improvement in his numbers as he continues to exercise and make lifestyle changes.      ITP Comments:     ITP Comments  03/08/15 1511 03/23/15 0837 04/02/15 0911 04/22/15 1247 05/23/15 0926   ITP Comments Decrease sodium. ([name] said he has already met with a Registered Dietician that Dr. Rockey Situ arranged so he feels he doesn't need to meet individually with our Cardiac Rehab Registered Harvey. ) [name] already walks 7 days a week for 60 minutes at a time for exercise. 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 He had open heart surgery x 4 in 2016. [name] showed me where they tried to take a  vein for a graft in his right lower leg and sometimes he has right leg numbness which the MD knows about. [name] reports the numbness is less when he walks.  30 DAy Review. Continue with ITP.  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 He is able to walk on the treadmill today at 3.96mh  today with no problems.  30 Day Review. Continue with the ITP. 30 day review.  Continue with ITP  last visit  05/07/2015     06/20/15 1215 07/21/15 0722         ITP Comments 30 day review. Continue with ITP.  2 visits since last review 30 day review.  Continue with ITP  Sporadic attendance  because of work schedule         Comments:

## 2015-07-21 NOTE — Progress Notes (Signed)
Daily Session Note  Patient Details  Name: Juan [name] MRN: [id number] Date of Birth: 04-17-1955 Referring Provider:    Encounter Date: 07/21/2015  Check In:     Session Check In - 07/21/15 0754    Check-In   Location [facility]   Staff Present Alberteen Sam, MA, ACSM RCEP, Exercise Physiologist;Amanda Oletta Darter, BA, ACSM CEP, Exercise Physiologist;Carroll Enterkin, RN, BSN   Supervising physician immediately available to respond to emergencies See telemetry face sheet for immediately available ER MD   Medication changes reported     No   Fall or balance concerns reported    No   Warm-up and Cool-down Performed on first and last piece of equipment   Resistance Training Performed Yes   VAD Patient? No   Pain Assessment   Currently in Pain? No/denies   Multiple Pain Sites No         Goals Met:  Independence with exercise equipment Exercise tolerated well No report of cardiac concerns or symptoms Strength training completed today  Goals Unmet:  Not Applicable  Comments: Pt able to follow exercise prescription today without complaint.  Will continue to monitor for progression.    Dr. Emily Filbert is Medical Director for [place] [facility].

## 2015-07-26 DIAGNOSIS — Z951 Presence of aortocoronary bypass graft: Secondary | ICD-10-CM | POA: Diagnosis not present

## 2015-08-02 ENCOUNTER — Encounter: Payer: BLUE CROSS/BLUE SHIELD | Attending: Cardiovascular Disease

## 2015-08-02 DIAGNOSIS — Z951 Presence of aortocoronary bypass graft: Secondary | ICD-10-CM | POA: Insufficient documentation

## 2015-08-09 ENCOUNTER — Other Ambulatory Visit: Payer: Self-pay | Admitting: Cardiovascular Disease

## 2015-08-11 ENCOUNTER — Other Ambulatory Visit: Payer: Self-pay | Admitting: *Deleted

## 2015-08-11 DIAGNOSIS — Z951 Presence of aortocoronary bypass graft: Secondary | ICD-10-CM

## 2015-08-11 MED ORDER — RAMIPRIL 5 MG PO CAPS: ORAL_CAPSULE | ORAL | Status: DC

## 2015-08-11 NOTE — Progress Notes (Signed)
Daily Session Note  Patient Details  Name: Juan [name] MRN: [id number] Date of Birth: 09-Jan-1956 Referring Provider:    Encounter Date: 08/11/2015  Check In:     Session Check In - 08/11/15 0900    Check-In   Location [facility]   Staff Present Gerlene Burdock, RN, BSN;Jessica Luan Pulling, MA, ACSM RCEP, Exercise Physiologist;Amanda Oletta Darter, BA, ACSM CEP, Exercise Physiologist   Supervising physician immediately available to respond to emergencies See telemetry face sheet for immediately available ER MD   Medication changes reported     No   Warm-up and Cool-down Performed on first and last piece of equipment   Resistance Training Performed Yes   VAD Patient? No   Pain Assessment   Currently in Pain? No/denies   Multiple Pain Sites No         Goals Met:  Independence with exercise equipment Exercise tolerated well No report of cardiac concerns or symptoms Strength training completed today  Goals Unmet:  Not Applicable  Comments: Pt able to follow exercise prescription today without complaint.  Will continue to monitor for progression.    Dr. Emily Filbert is Medical Director for [place] [facility].

## 2015-08-11 NOTE — Telephone Encounter (Signed)
Requested Prescriptions   Signed Prescriptions Disp Refills  . ramipril (ALTACE) 5 MG capsule 90 capsule 3    Sig: TAKE 1 BY MOUTH DAILY    Authorizing Provider: Minna Merritts    Ordering User: Britt Bottom

## 2015-08-18 ENCOUNTER — Encounter: Payer: Self-pay | Admitting: *Deleted

## 2015-08-18 DIAGNOSIS — Z951 Presence of aortocoronary bypass graft: Secondary | ICD-10-CM

## 2015-08-18 NOTE — Progress Notes (Signed)
Cardiac Individual Treatment Plan  Patient Details  Name: Juan [name] MRN: [id number] Date of Birth: 1955/10/14 Referring Provider:    Initial Encounter Date:       Cardiac Rehab from 03/08/2015 in [facility] Cardiac and Pulmonary Rehab   Date  03/08/15      Visit Diagnosis: S/P CABG x 4  Patient's Home Medications on Admission:  Current outpatient prescriptions:  .  aspirin 81 MG tablet, Take 81 mg by mouth daily., Disp: , Rfl:  .  clopidogrel (PLAVIX) 75 MG tablet, Take 1 tablet (75 mg total) by mouth daily., Disp: 30 tablet, Rfl: 3 .  folic acid (FOLVITE) 341 MCG tablet, Take 800 mcg by mouth daily. , Disp: , Rfl:  .  Insulin Glargine (LANTUS) 100 UNIT/ML Solostar Pen, Inject 24 Units into the skin daily at 10 pm. (Patient taking differently: Inject 20-22 Units into the skin daily at 10 pm. ), Disp: 15 mL, Rfl: 11 .  levothyroxine (SYNTHROID) 100 MCG tablet, Take 1 tablet (100 mcg total) by mouth daily before breakfast., Disp: 90 tablet, Rfl: 0 .  metFORMIN (GLUCOPHAGE-XR) 500 MG 24 hr tablet, Take 1 tablet (500 mg total) by mouth 2 (two) times daily with a meal., Disp: 60 tablet, Rfl: 1 .  metoprolol tartrate (LOPRESSOR) 50 MG tablet, Take 1 tablet (50 mg total) by mouth 2 (two) times daily., Disp: 180 tablet, Rfl: 3 .  Multiple Vitamin (MULTIVITAMIN) tablet, Take 1 tablet by mouth 2 (two) times daily. , Disp: , Rfl:  .  nitroGLYCERIN (NITROSTAT) 0.4 MG SL tablet, Place 0.4 mg under the tongue every 5 (five) minutes as needed for chest pain., Disp: , Rfl:  .  Omega-3 Fatty Acids (FISH OIL PO), Take 1,800 mg by mouth daily. , Disp: , Rfl:  .  ramipril (ALTACE) 5 MG capsule, Take 1 capsule (5 mg total) by mouth daily., Disp: 90 capsule, Rfl: 3 .  ramipril (ALTACE) 5 MG capsule, TAKE 1 BY MOUTH DAILY, Disp: 90 capsule, Rfl: 3 .  simvastatin (ZOCOR) 40 MG tablet, TAKE 1 BY MOUTH DAILY, Disp: 90 tablet, Rfl: 3  Past Medical History: Past Medical History  Diagnosis Date  . Essential  hypertension   . Hyperlipidemia   . Coronary artery disease     a. Dating back to 28 w/ h/o MI.  8 stents;  b. 10/2003 Cath [place]: LM 20d, RI 40p, LAD 30p, 21m patent stent, D1 50p, LCX 30p, patent mid stent, 90d/diffuse, RCA dominant, 30p, patent stent, 471m, EF 44%-->Med Rx.  . [place] II diabetes [place] ([place]  . Mild sleep apnea   . Hypothyroidism   . Myocardial infarction ([facility]    "small MI"   . Heart murmur     found during office visit  . Chronic kidney disease   . Asthma     pt. reports that he was treated for pnueumonia as a young child only    Tobacco Use: History  Smoking status  . Never Smoker   Smokeless tobacco  . Never Used    Labs: Recent Review Flowsheet Data    Labs for ITP Cardiac and Pulmonary Rehab Latest Ref Rng 01/29/2015 01/29/2015 01/29/2015 01/29/2015 01/30/2015   PHART 7.350 - 7.450 7.431 7.397 7.355 - -   PCO2ART 35.0 - 45.0 mmHg 35.9 40.5 42.0 - -   HCO3 20.0 - 24.0 mEq/L 24.2(H) 25.1(H) 23.5 - -   TCO2 0 - 100 mmol/L 25 26 25 24 24    ACIDBASEDEF 0.0 - 2.0 mmol/L - -  2.0 - -   O2SAT - 99.0 99.0 98.0 - -       Exercise Target Goals:    Exercise Program Goal: Individual exercise prescription set with THRR, safety & activity barriers. Participant demonstrates ability to understand and report RPE using BORG scale, to self-measure pulse accurately, and to acknowledge the importance of the exercise prescription.  Exercise Prescription Goal: Starting with aerobic activity 30 plus minutes a day, 3 days per week for initial exercise prescription. Provide home exercise prescription and guidelines that participant acknowledges understanding prior to discharge.  Activity Barriers & Risk Stratification:     Activity Barriers & Cardiac Risk Stratification - 03/08/15 1501    Activity Barriers & Cardiac Risk Stratification   Activity Barriers None   Cardiac Risk Stratification High      6 Minute Walk:     6 Minute Walk      03/08/15 1411        6 Minute Walk   Phase Initial     Distance 1700 feet     Walk Time 6 minutes     # of Rest Breaks 0     MPH 3.2     RPE 8     Resting HR 78 bpm     Resting BP 128/80 mmHg     Max Ex. HR 110 bpm     Max Ex. BP 122/68 mmHg     Pre/Post BP   Pre/Post BP? --        Initial Exercise Prescription:     Initial Exercise Prescription - 03/08/15 1400    Date of Initial Exercise RX and Referring Provider   Date 03/08/15   Treadmill   MPH 3   Grade 0   Minutes 20   Bike   Level 0.4   Watts 15   Minutes 20   Recumbant Bike   Level 6   RPM 50   Watts 50   Minutes 20   NuStep   Level 4   Watts 55   Minutes 20   Arm Ergometer   Level 1   Watts 10   Minutes 15   Arm/Foot Ergometer   Level 4   Watts 15   Minutes 15   Cybex   Level 4   RPM 60   Minutes 20   Recumbant Elliptical   Level 2   RPM 40   Watts 20   Minutes 20   Elliptical   Level 1   Speed 3   Minutes 5   REL-XR   Level 3   Watts 50   Minutes 20   T5 Nustep   Level 3   Watts 30   Minutes 20   Biostep-RELP   Level 5   Watts 40   Minutes 20   Prescription Details   Frequency (times per week) 3   Duration Progress to 50 minutes of aerobic without signs/symptoms of physical distress   Intensity   THRR REST +  30   Ratings of Perceived Exertion 11-15   Progression   Progression Continue progressive overload as per policy without signs/symptoms or physical distress.   Resistance Training   Training Prescription Yes   Weight 4   Reps 10-15      Perform Capillary Blood Glucose checks as needed.  Exercise Prescription Changes:     Exercise Prescription Changes      03/17/15 0900 04/02/15 0900 05/07/15 1000 06/09/15 0636 06/30/15 1200   Exercise Review   Progression  Yes Yes Yes Yes   Response to Exercise   Blood Pressure (Admit)  142/82 mmHg 128/78 mmHg 152/88 mmHg 126/78 mmHg   Blood Pressure (Exercise)  140/80 mmHg 144/78 mmHg 168/80 mmHg 132/62 mmHg   Blood Pressure (Exit)   112/72 mmHg 138/70 mmHg 112/74 mmHg 130/76 mmHg   Heart Rate (Admit)  79 bpm -- 82 bpm 88 bpm   Heart Rate (Exercise)  105 bpm 107 bpm 119 bpm 108 bpm   Heart Rate (Exit)  91 bpm 93 bpm 84 bpm 95 bpm   Rating of Perceived Exertion (Exercise)  13 10 12 11    Symptoms None None None none none   Comments First day of exercise! Patient was oriented to the gym and the equipment functions and settings. Procedures and policies of the gym were outlined and explained. The patient's individual exercise prescription and treatment plan were reviewed with them. All starting workloads were established based on the results of the functional testing  done at the initial intake visit. The plan for exercise progression was also introduced and progression will be customized based on the patient's performance and goals.  Reviewed individualized exercise prescription and made increases per departmental policy. Exercise increases were discussed with the patient and they were able to perform the new work loads without issue (no signs or symptoms).  --     Duration Progress to 30 minutes of continuous aerobic without signs/symptoms of physical distress Progress to 30 minutes of continuous aerobic without signs/symptoms of physical distress Progress to 45 minutes of aerobic exercise without signs/symptoms of physical distress Progress to 45 minutes of aerobic exercise without signs/symptoms of physical distress Progress to 30 minutes of continuous aerobic without signs/symptoms of physical distress   Intensity Rest + 30 Rest + 30 Rest + 30 Rest + 30 THRR New  113-146   Progression   Progression Continue progressive overload as per policy without signs/symptoms or physical distress. Continue progressive overload as per policy without signs/symptoms or physical distress. Continue progressive overload as per policy without signs/symptoms or physical distress. Continue progressive overload as per policy without signs/symptoms or  physical distress. Continue to progress workloads to maintain intensity without signs/symptoms of physical distress.   Average METs     5.25   Resistance Training   Training Prescription  Yes Yes Yes Yes   Weight  5 5 5 10    Reps  10-15 10-15 10-15 10-15   Training Prescription (read-only) Yes       Weight (read-only) 4       Reps (read-only) 10-15       Interval Training   Interval Training     Yes   Equipment     Treadmill   Comments     incline changes from 3.0-3.5   Treadmill   MPH  3.6 3.8 3.8 3.6   Grade  1 1 3.5  elevation ranges from 3-3.5 3   Minutes  40 45 45 35   MPH (read-only) 2       Grade (read-only) 0       Minutes (read-only) 20       NuStep   Level     4   Watts     103   Minutes     97     07/14/15 1100 07/29/15 0800 08/11/15 1500       Exercise Review   Progression Yes Yes Yes     Response to Exercise   Blood Pressure (Admit) 130/74 mmHg 134/68  mmHg 120/60 mmHg     Blood Pressure (Exercise) 158/62 mmHg 134/66 mmHg      Blood Pressure (Exit) 134/64 mmHg 126/66 mmHg 134/64 mmHg     Heart Rate (Admit) 77 bpm 85 bpm 63 bpm     Heart Rate (Exercise) 111 bpm 126 bpm 129 bpm     Heart Rate (Exit) 95 bpm 94 bpm 84 bpm     Rating of Perceived Exertion (Exercise) 12 14 14      Symptoms none none none     Comments   Reviewed individualized exercise prescription and made increases per departmental policy. Exercise increases were discussed with the patient and they were able to perform the new work loads without issue (no signs or symptoms).      Duration Progress to 45 minutes of aerobic exercise without signs/symptoms of physical distress Progress to 50 minutes of aerobic without signs/symptoms of physical distress Progress to 50 minutes of aerobic without signs/symptoms of physical distress     Intensity THRR unchanged THRR unchanged THRR unchanged     Progression   Progression Continue to progress workloads to maintain intensity without signs/symptoms of physical  distress. Continue to progress workloads to maintain intensity without signs/symptoms of physical distress. Continue to progress workloads to maintain intensity without signs/symptoms of physical distress.     Average METs 6.39  6.79     Resistance Training   Training Prescription Yes Yes Yes     Weight 10 10 10      Reps 10-15 10-15 10-15     Interval Training   Interval Training Yes Yes Yes     Equipment Treadmill Treadmill Treadmill     Comments incline changes from 3.0-3.5 incline changes 3 - 3.5 incline changes 3 - 3.5     Treadmill   MPH 4.4 4.4 4.4     Grade 3.5 4 4      Minutes 45 50 45     METs 6.49  6.79     Home Exercise Plan   Plans to continue exercise at Home  treadmil  Home  treadmil     Frequency Add 3 additional days to program exercise sessions.  Add 3 additional days to program exercise sessions.        Exercise Comments:     Exercise Comments      05/19/15 0622 06/16/15 0639 06/30/15 1253 07/14/15 1025 08/11/15 1518   Exercise Comments [place] has nade increases in intensity and duration on the TM since starting the program. He now aims for 45-50 min on the TM as time allows in class, however he has been absent from class for 2 weeks and may need to lower his intensity or duration once he comes back to class after an extended absense.  [place] has made some slight increases in his workload intensities on the TM. His vital signs have all been acceptable during exercise and he is tolerating his exercise well when he comes to class. He has not been attending regularly which will effect his progression.  [name]'s attendance has been sporadic.  He is using his treadmil at home.  He continues to make good progress on his exercise.  At next session, we will discuss increasing his intervals. [name] is up to 45-60 min on treadmill here in program and at home.  Today we talked about how his first few walks after surgery he could barely make.  He was pleased with how well he has comed  during the program. [name] is doing intervals on  the treadmill for 31mn.  He really is amazed at how well he is doing and feeling.  We will continue to monitor [name] for progress.  Today was [name]'s only visit since last review.      Discharge Exercise Prescription (Final Exercise Prescription Changes):     Exercise Prescription Changes - 08/11/15 1500    Exercise Review   Progression Yes   Response to Exercise   Blood Pressure (Admit) 120/60 mmHg   Blood Pressure (Exit) 134/64 mmHg   Heart Rate (Admit) 63 bpm   Heart Rate (Exercise) 129 bpm   Heart Rate (Exit) 84 bpm   Rating of Perceived Exertion (Exercise) 14   Symptoms none   Comments Reviewed individualized exercise prescription and made increases per departmental policy. Exercise increases were discussed with the patient and they were able to perform the new work loads without issue (no signs or symptoms).    Duration Progress to 50 minutes of aerobic without signs/symptoms of physical distress   Intensity THRR unchanged   Progression   Progression Continue to progress workloads to maintain intensity without signs/symptoms of physical distress.   Average METs 6.79   Resistance Training   Training Prescription Yes   Weight 10   Reps 10-15   Interval Training   Interval Training Yes   Equipment Treadmill   Comments incline changes 3 - 3.5   Treadmill   MPH 4.4   Grade 4   Minutes 45   METs 6.79   Home Exercise Plan   Plans to continue exercise at Home  treadmil   Frequency Add 3 additional days to program exercise sessions.      Nutrition:  Target Goals: Understanding of nutrition guidelines, daily intake of sodium <15068m cholesterol <20043mcalories 30% from fat and 7% or less from saturated fats, daily to have 5 or more servings of fruits and vegetables.  Biometrics:     Pre Biometrics - 03/08/15 1410    Pre Biometrics   Height 5' 7.6" (1.717 m)   Weight 195 lb 9.6 oz (88.724 kg)   Waist Circumference 40.5  inches   Hip Circumference 38.25 inches   Waist to Hip Ratio 1.06 %   BMI (Calculated) 30.2       Nutrition Therapy Plan and Nutrition Goals:     Nutrition Therapy & Goals - 03/08/15 1505    Nutrition Therapy   Drug/Food Interactions Statins/Certain Fruits   Personal Nutrition Goals   Personal Goal #1 Decrease sodium. ([name] said he has already met with a Registered Dietician that Dr. GolRockey Situranged so he feels he doesn't need to meet individually with our Cardiac Rehab Registered Dietician. )   Personal Goal #2 Limit saturated fats.   Personal Goal #3 Eat fruit with the skin on it ie peaches, apples but nothing in cans is what he previous dietician taught [name] he reprots.    Intervention Plan   Intervention (read-only) Using nutrition plan and personal goals to gain a healthy nutrition lifestyle. Add exercise as prescribed.      Nutrition Discharge: Rate Your Plate Scores:   Nutrition Goals Re-Evaluation:     Nutrition Goals Re-Evaluation      04/02/15 0908           Personal Goal #1 Re-Evaluation   Goal Progress Seen Yes       Comments Jospeh said again today he doesn't feel like he needs to meet individually with a registered dietician since he knows what to do.  Personal Goal #2 Re-Evaluation   Goal Progress Seen Yes       Personal Goal #3 Re-Evaluation   Goal Progress Seen Yes          Psychosocial: Target Goals: Acknowledge presence or absence of depression, maximize coping skills, provide positive support system. Participant is able to verbalize types and ability to use techniques and skills needed for reducing stress and depression.  Initial Review & Psychosocial Screening:     Initial Psych Review & Screening - 03/08/15 [place]? Yes   Comments [name] has to pay a $3000 deductible for the new 2017 so he said he may not attend all 36 sessions since each session will be an out of pocket expense.    Barriers    Psychosocial barriers to participate in program There are no identifiable barriers or psychosocial needs.   Screening Interventions   Interventions Encouraged to exercise      Quality of Life Scores:     Quality of Life - 03/08/15 1539    Quality of Life Scores   Health/Function Pre 26 %   Socioeconomic Pre 27.86 %   Psych/Spiritual Pre 28.29 %   Family Pre 30 %   GLOBAL Pre 27.44 %      PHQ-9:     Recent Review Flowsheet Data    Depression screen [facility] 2/9 03/08/2015   Decreased Interest 0   Down, Depressed, Hopeless 0   PHQ - 2 Score 0   Altered sleeping 2   Tired, decreased energy 0   Change in appetite 0   Feeling bad or failure about yourself  0   Trouble concentrating 0   Moving slowly or fidgety/restless 0   PHQ-9 Score 2   Difficult doing work/chores Not difficult at all      Psychosocial Evaluation and Intervention:     Psychosocial Evaluation - 04/21/15 0945    Psychosocial Evaluation & Interventions   Interventions Encouraged to exercise with the program and follow exercise prescription;Stress management education   Comments Counselor met with Mr. Cremer today for initial psychosocial evaluation.  He is a 60 year old well-adjusted gentleman who had bypass surgery on Dec. 30, 2016.  He has a strong support system with a spouse of 82 years and adult children who live close by.  He is also actively involved in his local church.  Mr. Tiso reports he sleeps well; has a good appetite and denies a history of or current symptoms of anxiety or depression.  He states his mood is typically positive and he has minimal stress other than some work related issues.  Mr. Witts has goals to be more educated and increase his stamina and strength while in this program.  He has home gym equipment and walks outside typically up to one hour each day so plans to maintain this following completion of this program.     Continued Psychosocial Services Needed Yes  Mr. Chalfant will  benefit from the psychoeducational components of this program - particularly stress management.        Psychosocial Re-Evaluation:     Psychosocial Re-Evaluation      04/02/15 0910 06/16/15 1005 06/16/15 1006       Psychosocial Re-Evaluation   Interventions Encouraged to attend Cardiac Rehabilitation for the exercise       Comments [name] said he feel everything is going well in Cardiac Rehab for him and he has been able to attend frequently.  Follow up with Mr. Odonoghue today reporting feeling stronger and more stamina since coming into this program.  He also reports he has purchased a tread mill and is working out at home on days that he cannot be here which is helping as well.  Mr. Delane Ginger Follow up with Mr. Weatherall today reporting feeling stronger and more stamina since coming into this program.  He also reports he has purchased a tread mill and is working out at home on days that he cannot be here which is helping as well.  Mr. Delane Ginger continues to have a positive mood and minimal stress - other than work at times.          Vocational Rehabilitation: Provide vocational rehab assistance to qualifying candidates.   Vocational Rehab Evaluation & Intervention:     Vocational Rehab - 03/08/15 1503    Initial Vocational Rehab Evaluation & Intervention   Assessment shows need for Vocational Rehabilitation No      Education: Education Goals: Education classes will be provided on a weekly basis, covering required topics. Participant will state understanding/return demonstration of topics presented.  Learning Barriers/Preferences:     Learning Barriers/Preferences - 03/08/15 1502    Learning Barriers/Preferences   Learning Barriers None   Learning Preferences None      Education Topics: General Nutrition Guidelines/Fats and Fiber: -Group instruction provided by verbal, written material, models and posters to present the general guidelines for heart healthy nutrition. Gives an explanation and  review of dietary fats and fiber.   Controlling Sodium/Reading Food Labels: -Group verbal and written material supporting the discussion of sodium use in heart healthy nutrition. Review and explanation with models, verbal and written materials for utilization of the food label.   Exercise Physiology & Risk Factors: - Group verbal and written instruction with models to review the exercise physiology of the cardiovascular system and associated critical values. Details cardiovascular disease risk factors and the goals associated with each risk factor.          Cardiac Rehab from 07/21/2015 in [facility]iac [facility] Pulmonary Rehab   Date  07/14/15   Educator  [facility]   Instruction Review Code  2- meets goals/outcomes      Aerobic Exercise & Resistance Training: - Gives group verbal and written discussion on the health impact of inactivity. On the components of aerobic and resistive training programs and the benefits of this training and how to safely progress through these programs.   Flexibility, Balance, General Exercise Guidelines: - Provides group verbal and written instruction on the benefits of flexibility and balance training programs. Provides general exercise guidelines with specific guidelines to those with heart or lung disease. Demonstration and skill practice provided.      Cardiac Rehab from 07/21/2015 in [facility] Cardiac and Pulmonary Rehab   Date  07/21/15   Educator  AS   Instruction Review Code  R- Review/reinforce [Second Class]      Stress Management: - Provides group verbal and written instruction about the health risks of elevated stress, cause of high stress, and healthy ways to reduce stress.   Depression: - Provides group verbal and written instruction on the correlation between heart/lung disease and depressed mood, treatment options, and the stigmas associated with seeking treatment.      Cardiac Rehab from 07/21/2015 in [facility] Cardiac and Pulmonary Rehab   Date  06/30/15  [Second Class]   Educator  [facility]   Instruction Review Code  R- Review/reinforce      Anatomy &  Physiology of the Heart: - Group verbal and written instruction and models provide basic cardiac anatomy and physiology, with the coronary electrical and arterial systems. Review of: AMI, Angina, Valve disease, Heart Failure, Cardiac Arrhythmia, Pacemakers, and the ICD.   Cardiac Procedures: - Group verbal and written instruction and models to describe the testing methods done to diagnose heart disease. Reviews the outcomes of the test results. Describes the treatment choices: Medical Management, Angioplasty, or Coronary Bypass Surgery.   Cardiac Medications: - Group verbal and written instruction to review commonly prescribed medications for heart disease. Reviews the medication, class of the drug, and side effects. Includes the steps to properly store meds and maintain the prescription regimen.      Cardiac Rehab from 07/21/2015 in [facility] Cardiac and Pulmonary Rehab   Date  06/16/15   Educator  SB   Instruction Review Code  2- meets goals/outcomes [Part II:  Cardiac Medications]      Go Sex-Intimacy & Heart Disease, Get SMART - Goal Setting: - Group verbal and written instruction through game format to discuss heart disease and the return to sexual intimacy. Provides group verbal and written material to discuss and apply goal setting through the application of the S.M.A.R.T. Method.   Other Matters of the Heart: - Provides group verbal, written materials and models to describe Heart Failure, Angina, Valve Disease, and Diabetes in the realm of heart disease. Includes description of the disease process and treatment options available to the cardiac patient.   Exercise & Equipment Safety: - Individual verbal instruction and demonstration of equipment use and safety with use of the equipment.      Cardiac Rehab from 07/21/2015 in [facility] Cardiac and Pulmonary Rehab   Date  03/08/15   Educator  C.  EnterkinRN   Instruction Review Code  1- partially meets, needs review/practice      Infection Prevention: - Provides verbal and written material to individual with discussion of infection control including proper hand washing and proper equipment cleaning during exercise session.      Cardiac Rehab from 07/21/2015 in [facility] Cardiac and Pulmonary Rehab   Date  03/08/15   Educator  C. EnterkinRN   Instruction Review Code  2- meets goals/outcomes      Falls Prevention: - Provides verbal and written material to individual with discussion of falls prevention and safety.      Cardiac Rehab from 07/21/2015 in [facility] Cardiac and Pulmonary Rehab   Date  03/08/15   Educator  C. [place]   Instruction Review Code  2- meets goals/outcomes      Diabetes: - Individual verbal and written instruction to review signs/symptoms of diabetes, desired ranges of glucose level fasting, after meals and with exercise. Advice that pre and post exercise glucose checks will be done for 3 sessions at entry of program.      Cardiac Rehab from 07/21/2015 in [facility]   Date  03/08/15   Educator  Loletha Grayer ENterkinRN   Instruction Review Code  1- partially meets, needs review/practice       Knowledge Questionnaire Score:     Knowledge Questionnaire Score - 03/08/15 1502    Knowledge Questionnaire Score   Pre Score 26      Core Components/Risk Factors/Patient Goals at Admission:     Personal Goals and Risk Factors at Admission - 03/08/15 1507    Core Components/Risk Factors/Patient Goals on Admission    Weight Management Yes   Intervention (read-only) Learn and follow the exercise  and diet guidelines while in the program. Utilize the nutrition and education classes to help gain knowledge of the diet and exercise expectations in the program   Admit Weight 195 lb (88.451 kg)   Goal Weight: Short Term 170 lb (77.111 kg)  1 year ago [name] was 230lbs   Sedentary Yes   Intervention (read-only)  While in program, learn and follow the exercise prescription taught. Start at a low level workload and increase workload after able to maintain previous level for 30 minutes. Increase time before increasing intensity.   Goal Blood glucose control identified by blood glucose values, HgbA1C. Participant verbalizes understanding of the signs/symptoms of hyper/hypo glycemia, proper foot care and importance of medication and nutrition plan for blood glucose control.   Intervention (read-only) Provide nutrition & aerobic exercise along with prescribed medications to achieve blood glucose in normal ranges: Fasting 65-99 mg/dL   Hypertension Yes   Goal Participant will see blood pressure controlled within the values of 140/36m/Hg or within value directed by their physician.   Intervention (read-only) Provide nutrition & aerobic exercise along with prescribed medications to achieve BP 140/90 or less.   Lipids Yes   Goal Cholesterol controlled with medications as prescribed, with individualized exercise RX and with personalized nutrition plan. Value goals: LDL < 726m HDL > 4066mParticipant states understanding of desired cholesterol values and following prescriptions.   Intervention (read-only) Provide nutrition & aerobic exercise along with prescribed medications to achieve LDL <65m75mDL >40mg65mTake Less Medication Yes   Intervention Learn your risk factors and begin the lifestyle modifications for risk factor control during your time in the program.      Core Components/Risk Factors/Patient Goals Review:      Goals and Risk Factor Review      04/02/15 0909 06/16/15 0935 06/16/15 0952 07/14/15 1021 08/11/15 0946   Core Components/Risk Factors/Patient Goals Review   Personal Goals Review Diabetes;Hypertension;Lipids Diabetes;Hypertension;Lipids;Weight Management/Obesity Weight Management/Obesity Hypertension;Lipids;Diabetes Sedentary;Increase Strength and Stamina;Hypertension;Diabetes;Lipids    Review [name] reports his HBA1c was done yesterday adn uch better down to 6.2 from he reported 11 in the hospital. He reported his cholestrol level yestersay was 133. His blood pressure is 140/80 walking on the treadmill  at 3.5mph.36me's goal for his weight is 190 pounds or less.  [name] states his FSBS this a.m. was 140.  He reports it being higher now that he has returned to work and is traveling.  He is working on better food sTherapist, nutritional traveling.  He states he eats a lot of salads and soups.  He also stated balsamic vinegarette dressings is not without sugar nor is low fat ranch.  The best dressing he has found for his diabetes is oil and vinegar.  BP  during check in to Cardiac  Rehab  today was 132/76.  BP during workout on NS was 132/70.  [name] reports cholesterol check from  March 17 was around 156.  [name] states being in Cardiac Rehab  program has helped him to "get off his butt" and start moving.  He and his wife purchased a treamill for his home and he is now using that at home.  Finally, [name] informed RN that he did not think he would learn anything from the Cardiac Rehab education sessions, but he stated, "I have learned something from every session."    [name]'s blood sugars have been up recently but he is montioring them daily. He is taking his statins and having no problems. His blood pressures  have been between 120-130/70-80 at rest at home and here in program.   [name] says he is feeling great and doing well with getting back into exercise.  He had a follow appt with labs on Monday and A1c was 7.9, Cholesterol numbers were all in target.  His blood pressures have been stable in the 120s/60-70s.   Expected Outcomes  Continue Cardiac Rehab program; continue exercise prescription; continue cholesterol and BP medications as prescribed; continue exercising at home in addition to Cardiac Rehab; continue with low cholesterol low fat diabetic diet to achieve heart healthy lifestyle and overall weight goals.    [name]  will continue to see improvement in his numbers as he continues to exercise and make lifestyle changes. [name] will continue to come to rehab and continue to make improvements in his exercise.      Core Components/Risk Factors/Patient Goals at Discharge (Final Review):      Goals and Risk Factor Review - 08/11/15 0946    Core Components/Risk Factors/Patient Goals Review   Personal Goals Review Sedentary;Increase Strength and Stamina;Hypertension;Diabetes;Lipids   Review [name] says he is feeling great and doing well with getting back into exercise.  He had a follow appt with labs on Monday and A1c was 7.9, Cholesterol numbers were all in target.  His blood pressures have been stable in the 120s/60-70s.   Expected Outcomes [name] will continue to come to rehab and continue to make improvements in his exercise.      ITP Comments:     ITP Comments      03/08/15 1511 03/23/15 0837 04/02/15 0911 04/22/15 1247 05/23/15 0926   ITP Comments Decrease sodium. ([name] said he has already met with a Registered Dietician that Dr. Rockey Situ arranged so he feels he doesn't need to meet individually with our Cardiac Rehab Registered [place]. ) [name] already walks 7 days a week for 60 minutes at a time for exercise. [name] reports he had a heart attack in his 44s and  has a total of 10 stents. He had open heart surgery x 4 in 2016. [name] showed me where they tried to take a  vein for a graft in his right lower leg and sometimes he has right leg numbness which the MD knows about. [name] reports the numbness is less when he walks.  30 DAy Review. Continue with ITP.  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 He is able to walk on the treadmill today at 3.87mh  today with no problems.  30 Day Review. Continue with the ITP. 30 day review.  Continue with ITP  last visit 05/07/2015     06/20/15 1215 07/21/15 0722 08/18/15 0733       ITP  Comments 30 day review. Continue with ITP.  2 visits since last review 30 day review.  Continue with ITP  Sporadic attendance  because of work schedule 30 day review. Continue with ITP. Atetndance sporadic because of work schedule        Comments:

## 2015-08-25 ENCOUNTER — Encounter: Payer: Self-pay | Admitting: *Deleted

## 2015-08-25 DIAGNOSIS — Z951 Presence of aortocoronary bypass graft: Secondary | ICD-10-CM

## 2015-09-01 ENCOUNTER — Encounter: Payer: BLUE CROSS/BLUE SHIELD | Attending: Cardiovascular Disease

## 2015-09-01 DIAGNOSIS — Z951 Presence of aortocoronary bypass graft: Secondary | ICD-10-CM

## 2015-09-01 NOTE — Progress Notes (Signed)
Daily Session Note  Patient Details  Name: Juan [name] MRN: [id number] Date of Birth: [birth year] Referring Provider:    Encounter Date: 09/01/2015  Check In:     Session Check In - 09/01/15 0851      Check-In   Location [facility]   Staff Present Alberteen Sam, MA, ACSM RCEP, Exercise Physiologist;Amanda Oletta Darter, BA, ACSM CEP, Exercise Physiologist;Carroll Enterkin, RN, BSN   Supervising physician immediately available to respond to emergencies See telemetry face sheet for immediately available ER MD   Medication changes reported     No   Fall or balance concerns reported    No   Resistance Training Performed Yes   VAD Patient? No     Pain Assessment   Currently in Pain? No/denies   Multiple Pain Sites No         Goals Met:  Independence with exercise equipment Exercise tolerated well Personal goals reviewed Strength training completed today  Goals Unmet:  Not Applicable  Comments: Pt able to follow exercise prescription today without complaint.  Will continue to monitor for progression.    Dr. Emily Filbert is Medical Director for [place] [facility].

## 2015-09-09 ENCOUNTER — Encounter: Payer: BLUE CROSS/BLUE SHIELD | Admitting: *Deleted

## 2015-09-09 DIAGNOSIS — Z951 Presence of aortocoronary bypass graft: Secondary | ICD-10-CM

## 2015-09-09 NOTE — Progress Notes (Signed)
Daily Session Note  Patient Details  Name: Juan [name] MRN: [id number] Date of Birth: [birth year] Referring Provider:    Encounter Date: 09/09/2015  Check In:     Session Check In - 09/09/15 1056      Check-In   Location [facility]   Staff Present Gerlene Burdock, RN, Vickki Hearing, BA, ACSM CEP, Exercise Physiologist;Sereen Schaff Luan Pulling, MA, ACSM RCEP, Exercise Physiologist   Supervising physician immediately available to respond to emergencies See telemetry face sheet for immediately available ER MD   Medication changes reported     No   Fall or balance concerns reported    No   Resistance Training Performed Yes   VAD Patient? No     Pain Assessment   Currently in Pain? No/denies   Multiple Pain Sites No           Exercise Prescription Changes - 09/08/15 1400      Exercise Review   Progression Yes     Response to Exercise   Blood Pressure (Admit) 110/62   Blood Pressure (Exercise) 142/78   Blood Pressure (Exit) 106/60   Heart Rate (Admit) 82 bpm   Heart Rate (Exercise) 144 bpm   Heart Rate (Exit) 101 bpm   Rating of Perceived Exertion (Exercise) 13   Symptoms none   Comments Reviewed individualized exercise prescription and made increases per departmental policy. Exercise increases were discussed with the patient and they were able to perform the new work loads without issue (no signs or symptoms).    Duration Progress to 45 minutes of aerobic exercise without signs/symptoms of physical distress   Intensity THRR unchanged     Progression   Progression Continue to progress workloads to maintain intensity without signs/symptoms of physical distress.   Average METs 6.79     Resistance Training   Training Prescription Yes   Weight 10 lbs   Reps 10-15     Interval Training   Interval Training Yes   Equipment Treadmill   Comments incline changes 3 - 4.0     Treadmill   MPH 4.4   Grade 4   Minutes 45   METs 6.79     Home Exercise  Plan   Plans to continue exercise at Home  treadmil   Frequency Add 3 additional days to program exercise sessions.      Goals Met:  Independence with exercise equipment Exercise tolerated well No report of cardiac concerns or symptoms Strength training completed today  Goals Unmet:  Not Applicable  Comments: Pt able to follow exercise prescription today without complaint.  Will continue to monitor for progression.    Dr. Emily Filbert is Medical Director for [place] [facility].

## 2015-09-15 ENCOUNTER — Encounter: Payer: Self-pay | Admitting: *Deleted

## 2015-09-15 DIAGNOSIS — Z951 Presence of aortocoronary bypass graft: Secondary | ICD-10-CM

## 2015-09-15 NOTE — Progress Notes (Signed)
Cardiac Individual Treatment Plan  Patient Details  Name: Juan [name] MRN: [id number] Date of Birth: 25-Feb-1955 Referring Provider:    Initial Encounter Date:  Flowsheet Row Cardiac Rehab from 03/08/2015 in [facility] Cardiac and Pulmonary Rehab  Date  03/08/15      Visit Diagnosis: S/P CABG x 4  Patient's Home Medications on Admission:  Current Outpatient Prescriptions:  .  aspirin 81 MG tablet, Take 81 mg by mouth daily., Disp: , Rfl:  .  clopidogrel (PLAVIX) 75 MG tablet, Take 1 tablet (75 mg total) by mouth daily., Disp: 30 tablet, Rfl: 3 .  folic acid (FOLVITE) 374 MCG tablet, Take 800 mcg by mouth daily. , Disp: , Rfl:  .  Insulin Glargine (LANTUS) 100 UNIT/ML Solostar Pen, Inject 24 Units into the skin daily at 10 pm. (Patient taking differently: Inject 20-22 Units into the skin daily at 10 pm. ), Disp: 15 mL, Rfl: 11 .  levothyroxine (SYNTHROID) 100 MCG tablet, Take 1 tablet (100 mcg total) by mouth daily before breakfast., Disp: 90 tablet, Rfl: 0 .  metFORMIN (GLUCOPHAGE-XR) 500 MG 24 hr tablet, Take 1 tablet (500 mg total) by mouth 2 (two) times daily with a meal., Disp: 60 tablet, Rfl: 1 .  metoprolol tartrate (LOPRESSOR) 50 MG tablet, Take 1 tablet (50 mg total) by mouth 2 (two) times daily., Disp: 180 tablet, Rfl: 3 .  Multiple Vitamin (MULTIVITAMIN) tablet, Take 1 tablet by mouth 2 (two) times daily. , Disp: , Rfl:  .  nitroGLYCERIN (NITROSTAT) 0.4 MG SL tablet, Place 0.4 mg under the tongue every 5 (five) minutes as needed for chest pain., Disp: , Rfl:  .  Omega-3 Fatty Acids (FISH OIL PO), Take 1,800 mg by mouth daily. , Disp: , Rfl:  .  ramipril (ALTACE) 5 MG capsule, Take 1 capsule (5 mg total) by mouth daily., Disp: 90 capsule, Rfl: 3 .  ramipril (ALTACE) 5 MG capsule, TAKE 1 BY MOUTH DAILY, Disp: 90 capsule, Rfl: 3 .  simvastatin (ZOCOR) 40 MG tablet, TAKE 1 BY MOUTH DAILY, Disp: 90 tablet, Rfl: 3  Past Medical History: Past Medical History:  Diagnosis Date  . Asthma     pt. reports that he was treated for pnueumonia as a young child only  . Chronic kidney disease   . Coronary artery disease    a. Dating back to 70 w/ h/o MI.  8 stents;  b. 10/2003 Cath [place]: LM 20d, RI 40p, LAD 30p, 65m patent stent, D1 50p, LCX 30p, patent mid stent, 90d/diffuse, RCA dominant, 30p, patent stent, 441m, EF 44%-->Med Rx.  . Essential hypertension   . Heart murmur    found during office visit  . Hyperlipidemia   . Hypothyroidism   . Mild sleep apnea   . Myocardial infarction ([facility]   "small MI"   . Type II diabetes mellitus (HCC)     Tobacco Use: History  Smoking Status  . Never Smoker  Smokeless Tobacco  . Never Used    Labs: Recent Review Flowsheet Data    Labs for ITP Cardiac and Pulmonary Rehab Latest Ref Rng & Units 01/29/2015 01/29/2015 01/29/2015 01/29/2015 01/30/2015   Cholestrol 0 - 200 mg/dL - - - - -   LDLCALC 0 - 99 mg/dL - - - - -   LDLDIRECT mg/dL - - - - -   HDL >39.00 mg/dL - - - - -   Trlycerides 0.0 - 149.0 mg/dL - - - - -   Hemoglobin A1c 4.8 -  5.6 % - - - - -   PHART 7.350 - 7.450 7.431 7.397 7.355 - -   PCO2ART 35.0 - 45.0 mmHg 35.9 40.5 42.0 - -   HCO3 20.0 - 24.0 mEq/L 24.2(H) 25.1(H) 23.5 - -   TCO2 0 - 100 mmol/L 25 26 25 24 24    ACIDBASEDEF 0.0 - 2.0 mmol/L - - 2.0 - -   O2SAT % 99.0 99.0 98.0 - -       Exercise Target Goals:    Exercise Program Goal: Individual exercise prescription set with THRR, safety & activity barriers. Participant demonstrates ability to understand and report RPE using BORG scale, to self-measure pulse accurately, and to acknowledge the importance of the exercise prescription.  Exercise Prescription Goal: Starting with aerobic activity 30 plus minutes a day, 3 days per week for initial exercise prescription. Provide home exercise prescription and guidelines that participant acknowledges understanding prior to discharge.  Activity Barriers & Risk Stratification:   6 Minute  Walk:   Initial Exercise Prescription:   Perform Capillary Blood Glucose checks as needed.  Exercise Prescription Changes:     Exercise Prescription Changes    Row Name 04/02/15 0900 05/07/15 1000 06/09/15 0636 06/30/15 1200 07/14/15 1100     Exercise Review   Progression Yes Yes Yes Yes Yes     Response to Exercise   Blood Pressure (Admit) 142/82 128/78 152/88 126/78 130/74   Blood Pressure (Exercise) 140/80 144/78 168/80 132/62 158/62   Blood Pressure (Exit) 112/72 138/70 112/74 130/76 134/64   Heart Rate (Admit) 79 bpm - 82 bpm 88 bpm 77 bpm   Heart Rate (Exercise) 105 bpm 107 bpm 119 bpm 108 bpm 111 bpm   Heart Rate (Exit) 91 bpm 93 bpm 84 bpm 95 bpm 95 bpm   Rating of Perceived Exertion (Exercise) 13 10 12 11 12    Symptoms None None none none none   Comments Reviewed individualized exercise prescription and made increases per departmental policy. Exercise increases were discussed with the patient and they were able to perform the new work loads without issue (no signs or symptoms).  -  -  -  -   Duration Progress to 30 minutes of continuous aerobic without signs/symptoms of physical distress Progress to 45 minutes of aerobic exercise without signs/symptoms of physical distress Progress to 45 minutes of aerobic exercise without signs/symptoms of physical distress Progress to 30 minutes of continuous aerobic without signs/symptoms of physical distress Progress to 45 minutes of aerobic exercise without signs/symptoms of physical distress   Intensity Rest + 30 Rest + 30 Rest + 30 THRR New  113-146 THRR unchanged     Progression   Progression Continue progressive overload as per policy without signs/symptoms or physical distress. Continue progressive overload as per policy without signs/symptoms or physical distress. Continue progressive overload as per policy without signs/symptoms or physical distress. Continue to progress workloads to maintain intensity without signs/symptoms of  physical distress. Continue to progress workloads to maintain intensity without signs/symptoms of physical distress.   Average METs  -  -  - 5.25 6.39     Resistance Training   Training Prescription Yes Yes Yes Yes Yes   Weight 5 5 5 10 10    Reps 10-15 10-15 10-15 10-15 10-15     Interval Training   Interval Training  -  -  - Yes Yes   Equipment  -  -  - Treadmill Treadmill   Comments  -  -  - incline changes from  3.0-3.5 incline changes from 3.0-3.5     Treadmill   MPH 3.6 3.8 3.8 3.6 4.4   Grade 1 1 3.5  elevation ranges from 3-3.5 3 3.5   Minutes 40 45 45 35 45   METs  -  -  -  - 6.49     NuStep   Level  -  -  - 4  -   Watts  -  -  - 103  -   Minutes  -  -  - 97  -     Home Exercise Plan   Plans to continue exercise at  -  -  -  - Home  treadmil   Frequency  -  -  -  - Add 3 additional days to program exercise sessions.   Row Name 07/29/15 0800 08/11/15 1500 09/08/15 1400         Exercise Review   Progression Yes Yes Yes       Response to Exercise   Blood Pressure (Admit) 134/68 120/60 110/62     Blood Pressure (Exercise) 134/66  - 142/78     Blood Pressure (Exit) 126/66 134/64 106/60     Heart Rate (Admit) 85 bpm 63 bpm 82 bpm     Heart Rate (Exercise) 126 bpm 129 bpm 144 bpm     Heart Rate (Exit) 94 bpm 84 bpm 101 bpm     Rating of Perceived Exertion (Exercise) 14 14 13      Symptoms none none none     Comments  - Reviewed individualized exercise prescription and made increases per departmental policy. Exercise increases were discussed with the patient and they were able to perform the new work loads without issue (no signs or symptoms).  Reviewed individualized exercise prescription and made increases per departmental policy. Exercise increases were discussed with the patient and they were able to perform the new work loads without issue (no signs or symptoms).      Duration Progress to 50 minutes of aerobic without signs/symptoms of physical distress Progress to 50  minutes of aerobic without signs/symptoms of physical distress Progress to 45 minutes of aerobic exercise without signs/symptoms of physical distress     Intensity THRR unchanged THRR unchanged THRR unchanged       Progression   Progression Continue to progress workloads to maintain intensity without signs/symptoms of physical distress. Continue to progress workloads to maintain intensity without signs/symptoms of physical distress. Continue to progress workloads to maintain intensity without signs/symptoms of physical distress.     Average METs  - 6.79 6.79       Resistance Training   Training Prescription Yes Yes Yes     Weight 10 10 10  lbs     Reps 10-15 10-15 10-15       Interval Training   Interval Training Yes Yes Yes     Equipment Treadmill Treadmill Treadmill     Comments incline changes 3 - 3.5 incline changes 3 - 3.5 incline changes 3 - 4.0       Treadmill   MPH 4.4 4.4 4.4     Grade 4 4 4      Minutes 50 45 45     METs  - 6.79 6.79       Home Exercise Plan   Plans to continue exercise at  - Home  treadmil Home  treadmil     Frequency  - Add 3 additional days to program exercise sessions. Add 3 additional days to program exercise  sessions.        Exercise Comments:     Exercise Comments    Row Name 05/19/15 0622 06/16/15 2263 06/30/15 1253 07/14/15 1025 08/11/15 1518   Exercise Comments Delois has nade increases in intensity and duration on the TM since starting the program. He now aims for 45-50 min on the TM as time allows in class, however he has been absent from class for 2 weeks and may need to lower his intensity or duration once he comes back to class after an extended absense.  Earvin has made some slight increases in his workload intensities on the TM. His vital signs have all been acceptable during exercise and he is tolerating his exercise well when he comes to class. He has not been attending regularly which will effect his progression.  [name]'s attendance has  been sporadic.  He is using his treadmil at home.  He continues to make good progress on his exercise.  At next session, we will discuss increasing his intervals. [name] is up to 45-60 min on treadmill here in program and at home.  Today we talked about how his first few walks after surgery he could barely make.  He was pleased with how well he has comed during the program. [name] is doing intervals on the treadmill for 47mn.  He really is amazed at how well he is doing and feeling.  We will continue to monitor [name] for progress.  Today was [name]'s only visit since last review.   RAllenName 08/25/15 1035 09/01/15 1029 09/08/15 1433       Exercise Comments Pt has not attended since last review. [name] returned to day for the first time since 7/12.  He has been traveling for work and exercises daily. [name] continues to travel for work.  Has been absent since 09/01/15.        Discharge Exercise Prescription (Final Exercise Prescription Changes):     Exercise Prescription Changes - 09/08/15 1400      Exercise Review   Progression Yes     Response to Exercise   Blood Pressure (Admit) 110/62   Blood Pressure (Exercise) 142/78   Blood Pressure (Exit) 106/60   Heart Rate (Admit) 82 bpm   Heart Rate (Exercise) 144 bpm   Heart Rate (Exit) 101 bpm   Rating of Perceived Exertion (Exercise) 13   Symptoms none   Comments Reviewed individualized exercise prescription and made increases per departmental policy. Exercise increases were discussed with the patient and they were able to perform the new work loads without issue (no signs or symptoms).    Duration Progress to 45 minutes of aerobic exercise without signs/symptoms of physical distress   Intensity THRR unchanged     Progression   Progression Continue to progress workloads to maintain intensity without signs/symptoms of physical distress.   Average METs 6.79     Resistance Training   Training Prescription Yes   Weight 10 lbs   Reps 10-15     Interval  Training   Interval Training Yes   Equipment Treadmill   Comments incline changes 3 - 4.0     Treadmill   MPH 4.4   Grade 4   Minutes 45   METs 6.79     Home Exercise Plan   Plans to continue exercise at Home  treadmil   Frequency Add 3 additional days to program exercise sessions.      Nutrition:  Target Goals: Understanding of nutrition guidelines, daily intake of sodium <15027m  cholesterol <292m, calories 30% from fat and 7% or less from saturated fats, daily to have 5 or more servings of fruits and vegetables.  Biometrics:    Nutrition Therapy Plan and Nutrition Goals:   Nutrition Discharge: Rate Your Plate Scores:   Nutrition Goals Re-Evaluation:     Nutrition Goals Re-Evaluation    Row Name 04/02/15 0908             Personal Goal #1 Re-Evaluation   Goal Progress Seen Yes       Comments Jospeh said again today he doesn't feel like he needs to meet individually with a registered dietician since he knows what to do.          Personal Goal #2 Re-Evaluation   Goal Progress Seen Yes         Personal Goal #3 Re-Evaluation   Goal Progress Seen Yes          Psychosocial: Target Goals: Acknowledge presence or absence of depression, maximize coping skills, provide positive support system. Participant is able to verbalize types and ability to use techniques and skills needed for reducing stress and depression.  Initial Review & Psychosocial Screening:   Quality of Life Scores:   PHQ-9: Recent Review Flowsheet Data    [facility]   Decreased Interest 0   Down, Depressed, Hopeless 0   PHQ - 2 Score 0   Altered sleeping 2   Tired, decreased energy 0   Change in appetite 0   Feeling bad or failure about yourself  0   Trouble concentrating 0   Moving slowly or fidgety/restless 0   PHQ-9 Score 2   Difficult doing work/chores Not difficult at all      Psychosocial Evaluation and Intervention:     Psychosocial Evaluation -  04/21/15 0945      Psychosocial Evaluation & Interventions   Interventions Encouraged to exercise with the program and follow exercise prescription;Stress management education   Comments Counselor met with Mr. NBrensingertoday for initial psychosocial evaluation.  He is a 60year old well-adjusted gentleman who had bypass surgery on Dec. 30, 2016.  He has a strong support system with a spouse of 352years and adult children who live close by.  He is also actively involved in his local church.  Mr. NRegnierreports he sleeps well; has a good appetite and denies a history of or current symptoms of anxiety or depression.  He states his mood is typically positive and he has minimal stress other than some work related issues.  Mr. NHodapphas goals to be more educated and increase his stamina and strength while in this program.  He has home gym equipment and walks outside typically up to one hour each day so plans to maintain this following completion of this program.     Continued Psychosocial Services Needed Yes  Mr. NDegregorywill benefit from the psychoeducational components of this program - particularly stress management.        Psychosocial Re-Evaluation:     Psychosocial Re-Evaluation    RFreebornName 04/02/15 0910 06/16/15 1005 06/16/15 1006         Psychosocial Re-Evaluation   Interventions Encouraged to attend Cardiac Rehabilitation for the exercise  -  -     Comments [name] said he feel everything is going well in Cardiac Rehab for him and he has been able to attend frequently.  Follow up with Mr. NLichtenwalnertoday reporting feeling stronger and more stamina since coming  into this program.  He also reports he has purchased a tread mill and is working out at home on days that he cannot be here which is helping as well.  Mr. Delane Ginger Follow up with Mr. Studnicka today reporting feeling stronger and more stamina since coming into this program.  He also reports he has purchased a tread mill and is working out at  home on days that he cannot be here which is helping as well.  Mr. Delane Ginger continues to have a positive mood and minimal stress - other than work at times.          Vocational Rehabilitation: Provide vocational rehab assistance to qualifying candidates.   Vocational Rehab Evaluation & Intervention:   Education: Education Goals: Education classes will be provided on a weekly basis, covering required topics. Participant will state understanding/return demonstration of topics presented.  Learning Barriers/Preferences:   Education Topics: General Nutrition Guidelines/Fats and Fiber: -Group instruction provided by verbal, written material, models and posters to present the general guidelines for heart healthy nutrition. Gives an explanation and review of dietary fats and fiber.   Controlling Sodium/Reading Food Labels: -Group verbal and written material supporting the discussion of sodium use in heart healthy nutrition. Review and explanation with models, verbal and written materials for utilization of the food label.   Exercise Physiology & Risk Factors: - Group verbal and written instruction with models to review the exercise physiology of the cardiovascular system and associated critical values. Details cardiovascular disease risk factors and the goals associated with each risk factor. Flowsheet Row Cardiac Rehab from 09/09/2015 in [facility] Cardiac and Pulmonary Rehab  Date  07/14/15  Educator  [facility]  Instruction Review Code  2- meets goals/outcomes      Aerobic Exercise & Resistance Training: - Gives group verbal and written discussion on the health impact of inactivity. On the components of aerobic and resistive training programs and the benefits of this training and how to safely progress through these programs. Flowsheet Row Cardiac Rehab from 09/09/2015 in [facility] Cardiac and Pulmonary Rehab  Date  09/09/15  Educator  AS  Instruction Review Code  2- meets goals/outcomes      Flexibility,  Balance, General Exercise Guidelines: - Provides group verbal and written instruction on the benefits of flexibility and balance training programs. Provides general exercise guidelines with specific guidelines to those with heart or lung disease. Demonstration and skill practice provided. Flowsheet Row Cardiac Rehab from 09/09/2015 in [facility] Cardiac and Pulmonary Rehab  Date  07/21/15  Educator  AS  Instruction Review Code  R- Review/reinforce [Second Class]      Stress Management: - Provides group verbal and written instruction about the health risks of elevated stress, cause of high stress, and healthy ways to reduce stress.   Depression: - Provides group verbal and written instruction on the correlation between heart/lung disease and depressed mood, treatment options, and the stigmas associated with seeking treatment. Flowsheet Row Cardiac Rehab from 09/09/2015 in [facility] Cardiac and Pulmonary Rehab  Date  06/30/15 [Second Class]  Educator  [facility]  Instruction Review Code  R- Review/reinforce      Anatomy & Physiology of the Heart: - Group verbal and written instruction and models provide basic cardiac anatomy and physiology, with the coronary electrical and arterial systems. Review of: AMI, Angina, Valve disease, Heart Failure, Cardiac Arrhythmia, Pacemakers, and the ICD.   Cardiac Procedures: - Group verbal and written instruction and models to describe the testing methods done to diagnose heart disease. Reviews  the outcomes of the test results. Describes the treatment choices: Medical Management, Angioplasty, or Coronary Bypass Surgery.   Cardiac Medications: - Group verbal and written instruction to review commonly prescribed medications for heart disease. Reviews the medication, class of the drug, and side effects. Includes the steps to properly store meds and maintain the prescription regimen. Flowsheet Row Cardiac Rehab from 09/09/2015 in [facility] Cardiac and Pulmonary Rehab  Date   06/16/15  Educator  SB  Instruction Review Code  2- meets goals/outcomes [Part II:  Cardiac Medications]      Go Sex-Intimacy & Heart Disease, Get SMART - Goal Setting: - Group verbal and written instruction through game format to discuss heart disease and the return to sexual intimacy. Provides group verbal and written material to discuss and apply goal setting through the application of the S.M.A.R.T. Method.   Other Matters of the Heart: - Provides group verbal, written materials and models to describe Heart Failure, Angina, Valve Disease, and Diabetes in the realm of heart disease. Includes description of the disease process and treatment options available to the cardiac patient.   Exercise & Equipment Safety: - Individual verbal instruction and demonstration of equipment use and safety with use of the equipment. Flowsheet Row Cardiac Rehab from 09/09/2015 in [facility] Cardiac and Pulmonary Rehab  Date  03/08/15  Educator  C. EnterkinRN  Instruction Review Code  2- meets goals/outcomes      Infection Prevention: - Provides verbal and written material to individual with discussion of infection control including proper hand washing and proper equipment cleaning during exercise session. Flowsheet Row Cardiac Rehab from 09/09/2015 in [facility] Cardiac and Pulmonary Rehab  Date  03/08/15  Educator  C. EnterkinRN  Instruction Review Code  2- meets goals/outcomes      Falls Prevention: - Provides verbal and written material to individual with discussion of falls prevention and safety. Flowsheet Row Cardiac Rehab from 09/09/2015 in [facility] Cardiac and Pulmonary Rehab  Date  03/08/15  Educator  C. [place]  Instruction Review Code  2- meets goals/outcomes      Diabetes: - Individual verbal and written instruction to review signs/symptoms of diabetes, desired ranges of glucose level fasting, after meals and with exercise. Advice that pre and post exercise glucose checks will be done for 3  sessions at entry of program. [place] from 09/09/2015 in [facility] Cardiac and Pulmonary Rehab  Date  03/08/15  Educator  Loletha Grayer ENterkinRN  Instruction Review Code  1- partially meets, needs review/practice       Knowledge Questionnaire Score:   Core Components/Risk Factors/Patient Goals at Admission:   Core Components/Risk Factors/Patient Goals Review:      Goals and Risk Factor Review    Row Name 04/02/15 0909 06/16/15 0935 06/16/15 0952 07/14/15 1021 08/11/15 0946     Core Components/Risk Factors/Patient Goals Review   Personal Goals Review Diabetes;Hypertension;Lipids Diabetes;Hypertension;Lipids;Weight Management/Obesity Weight Management/Obesity Hypertension;Lipids;Diabetes Sedentary;Increase Strength and Stamina;Hypertension;Diabetes;Lipids   Review [name] reports his HBA1c was done yesterday adn uch better down to 6.2 from he reported 11 in the hospital. He reported his cholestrol level yestersay was 133. His blood pressure is 140/80 walking on the treadmill  at 3.43mh. [name]'s goal for his weight is 190 pounds or less.  [name] states his FSBS this a.m. was 140.  He reports it being higher now that he has returned to work and is traveling.  He is working on better fTherapist, nutritionalwhile traveling.  He states he eats a lot of salads and soups.  He also stated balsamic vinegarette dressings is not without sugar nor is low fat ranch.  The best dressing he has found for his diabetes is oil and vinegar.  BP  during check in to Cardiac  Rehab  today was 132/76.  BP during workout on NS was 132/70.  [name] reports cholesterol check from  March 17 was around 156.  [name] states being in Cardiac Rehab  program has helped him to "get off his butt" and start moving.  He and his wife purchased a treamill for his home and he is now using that at home.  Finally, [name] informed RN that he did not think he would learn anything from the Cardiac Rehab education sessions, but he stated, "I have learned  something from every session."    - [name]'s blood sugars have been up recently but he is montioring them daily. He is taking his statins and having no problems. His blood pressures have been between 120-130/70-80 at rest at home and here in program.   [name] says he is feeling great and doing well with getting back into exercise.  He had a follow appt with labs on Monday and A1c was 7.9, Cholesterol numbers were all in target.  His blood pressures have been stable in the 120s/60-70s.   Expected Outcomes  - Continue Cardiac Rehab program; continue exercise prescription; continue cholesterol and BP medications as prescribed; continue exercising at home in addition to Cardiac Rehab; continue with low cholesterol low fat diabetic diet to achieve heart healthy lifestyle and overall weight goals.    - [name] will continue to see improvement in his numbers as he continues to exercise and make lifestyle changes. [name] will continue to come to rehab and continue to make improvements in his exercise.   [place] Name 09/01/15 1030             Core Components/Risk Factors/Patient Goals Review   Personal Goals Review Hypertension;Diabetes;Lipids       Review [name]'s blood sugars usualy range in the 140s-150s.  His blood pressure has stayed around the 120s/60s.  He has not had any recent blood work.       Expected Outcomes [name] will continue to attend exercise and education classes.          Core Components/Risk Factors/Patient Goals at Discharge (Final Review):      Goals and Risk Factor Review - 09/01/15 1030      Core Components/Risk Factors/Patient Goals Review   Personal Goals Review Hypertension;Diabetes;Lipids   Review [name]'s blood sugars usualy range in the 140s-150s.  His blood pressure has stayed around the 120s/60s.  He has not had any recent blood work.   Expected Outcomes [name] will continue to attend exercise and education classes.      ITP Comments:     ITP Comments    Row Name 03/23/15 0837 04/02/15 0911  04/22/15 1247 05/23/15 0926 06/20/15 1215   ITP Comments 30 DAy Review. Continue with ITP.  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 He is able to walk on the treadmill today at 3.70mh  today with no problems.  30 Day Review. Continue with the ITP. 30 day review.  Continue with ITP  last visit 05/07/2015 30 day review. Continue with ITP.  2 visits since last review   Row Name 07/21/15 [id number]/19/17 [phone]/26/17 1035 09/15/15 0753  ITP Comments 30 day review.  Continue with ITP  Sporadic attendance  because of work schedule 30 day review. Continue with ITP. Atetndance sporadic because of work schedule Pt has not attended since last review. Left message on cell phone. 30 day review. Continue with ITP unless changes noted by Medical Director at signature of review.       Comments:

## 2015-09-20 ENCOUNTER — Encounter: Payer: BLUE CROSS/BLUE SHIELD | Admitting: *Deleted

## 2015-09-20 DIAGNOSIS — Z951 Presence of aortocoronary bypass graft: Secondary | ICD-10-CM | POA: Diagnosis not present

## 2015-09-20 NOTE — Progress Notes (Signed)
Daily Session Note  Patient Details  Name: Juan [name] MRN: [id number] Date of Birth: 10-26-1955 Referring Provider:    Encounter Date: 09/20/2015  Check In:     Session Check In - 09/20/15 0809      Check-In   Location [facility]   Staff Present Heath Lark, RN, BSN, Laveda Norman, BS, ACSM CEP, Exercise Physiologist;Jessica [place], [place], ACSM RCEP, Exercise Physiologist   Supervising physician immediately available to respond to emergencies See telemetry face sheet for immediately available ER MD   Medication changes reported     No   Fall or balance concerns reported    No   Warm-up and Cool-down Performed on first and last piece of equipment   Resistance Training Performed Yes   VAD Patient? No     Pain Assessment   Currently in Pain? No/denies   Pain Score 0-No pain   Multiple Pain Sites No         Goals Met:  Independence with exercise equipment Exercise tolerated well No report of cardiac concerns or symptoms Strength training completed today  Goals Unmet:  Not Applicable  Comments: Pt able to follow exercise prescription today without complaint.  Will continue to monitor for progression.   Dr. Emily Filbert is Medical Director for [facility] and [facility].

## 2015-09-23 NOTE — Patient Instructions (Signed)
Discharge Instructions  Patient Details  Name: Juan [name] MRN: [id number] Date of Birth: November 23, 1955 Referring Provider:  Minna Merritts, MD   Number of Visits: 30   Reason for Discharge:  Patient reached a stable level of exercise. Patient independent in their exercise.  Smoking History:  History  Smoking Status  . Never Smoker  Smokeless Tobacco  . Never Used    Diagnosis:  S/P CABG x 4  Initial Exercise Prescription:   Discharge Exercise Prescription (Final Exercise Prescription Changes):     Exercise Prescription Changes - 09/23/15 1400      Exercise Review   Progression Yes     Response to Exercise   Blood Pressure (Admit) 130/82   Blood Pressure (Exercise) 128/70   Blood Pressure (Exit) 124/74   Heart Rate (Admit) 77 bpm   Heart Rate (Exercise) 134 bpm   Heart Rate (Exit) 88 bpm   Rating of Perceived Exertion (Exercise) 13   Symptoms none   Comments Reviewed individualized exercise prescription and made increases per departmental policy. Exercise increases were discussed with the patient and they were able to perform the new work loads without issue (no signs or symptoms).    Duration Progress to 45 minutes of aerobic exercise without signs/symptoms of physical distress   Intensity THRR unchanged     Progression   Progression Continue to progress workloads to maintain intensity without signs/symptoms of physical distress.   Average METs 6.79     Resistance Training   Training Prescription Yes   Weight 10 lbs   Reps 10-15     Interval Training   Interval Training Yes   Equipment Treadmill   Comments incline changes 3 - 4.0     Treadmill   MPH 4.4   Grade 4   Minutes 45   METs 6.79     Home Exercise Plan   Plans to continue exercise at Home  treadmil   Frequency Add 3 additional days to program exercise sessions.      Functional Capacity:   Quality of Life:   Personal Goals: Goals established at orientation with  interventions provided to work toward goal.    Personal Goals Discharge:     Goals and Risk Factor Review - 09/01/15 1030      Core Components/Risk Factors/Patient Goals Review   Personal Goals Review Hypertension;Diabetes;Lipids   Review [name]'s blood sugars usualy range in the 140s-150s.  His blood pressure has stayed around the 120s/60s.  He has not had any recent blood work.   Expected Outcomes [name] will continue to attend exercise and education classes.      Nutrition & Weight - Outcomes:    Nutrition:   Nutrition Discharge:   Education Questionnaire Score:   Goals reviewed with patient; copy given to patient.

## 2015-09-27 ENCOUNTER — Encounter: Payer: BLUE CROSS/BLUE SHIELD | Admitting: *Deleted

## 2015-09-27 VITALS — Ht 67.6 in | Wt 202.1 lb

## 2015-09-27 DIAGNOSIS — Z951 Presence of aortocoronary bypass graft: Secondary | ICD-10-CM | POA: Diagnosis not present

## 2015-09-27 NOTE — Progress Notes (Addendum)
Cardiac Individual Treatment Plan  Patient Details  Name: Juan [name] MRN: [id number] Date of Birth: 04/26/1956 Referring Provider:    Initial Encounter Date:  Flowsheet Row Cardiac Rehab from 03/08/2015 in [facility] Cardiac and Pulmonary Rehab  Date  03/08/15      Visit Diagnosis: S/P CABG x 4  Patient's Home Medications on Admission:  Current Outpatient Prescriptions:  .  aspirin 81 MG tablet, Take 81 mg by mouth daily., Disp: , Rfl:  .  clopidogrel (PLAVIX) 75 MG tablet, Take 1 tablet (75 mg total) by mouth daily., Disp: 30 tablet, Rfl: 3 .  folic acid (FOLVITE) 409 MCG tablet, Take 800 mcg by mouth daily. , Disp: , Rfl:  .  Insulin Glargine (LANTUS) 100 UNIT/ML Solostar Pen, Inject 24 Units into the skin daily at 10 pm. (Patient taking differently: Inject 20-22 Units into the skin daily at 10 pm. ), Disp: 15 mL, Rfl: 11 .  levothyroxine (SYNTHROID) 100 MCG tablet, Take 1 tablet (100 mcg total) by mouth daily before breakfast., Disp: 90 tablet, Rfl: 0 .  metFORMIN (GLUCOPHAGE-XR) 500 MG 24 hr tablet, Take 1 tablet (500 mg total) by mouth 2 (two) times daily with a meal., Disp: 60 tablet, Rfl: 1 .  metoprolol tartrate (LOPRESSOR) 50 MG tablet, Take 1 tablet (50 mg total) by mouth 2 (two) times daily., Disp: 180 tablet, Rfl: 3 .  Multiple Vitamin (MULTIVITAMIN) tablet, Take 1 tablet by mouth 2 (two) times daily. , Disp: , Rfl:  .  nitroGLYCERIN (NITROSTAT) 0.4 MG SL tablet, Place 0.4 mg under the tongue every 5 (five) minutes as needed for chest pain., Disp: , Rfl:  .  Omega-3 Fatty Acids (FISH OIL PO), Take 1,800 mg by mouth daily. , Disp: , Rfl:  .  ramipril (ALTACE) 5 MG capsule, Take 1 capsule (5 mg total) by mouth daily., Disp: 90 capsule, Rfl: 3 .  ramipril (ALTACE) 5 MG capsule, TAKE 1 BY MOUTH DAILY, Disp: 90 capsule, Rfl: 3 .  simvastatin (ZOCOR) 40 MG tablet, TAKE 1 BY MOUTH DAILY, Disp: 90 tablet, Rfl: 3  Past Medical History: Past Medical History:  Diagnosis Date  . Asthma     pt. reports that he was treated for pnueumonia as a young child only  . Chronic kidney disease   . Coronary artery disease    a. Dating back to 43 w/ h/o MI.  8 stents;  b. 10/2003 Cath [place]: LM 20d, RI 40p, LAD 30p, 56m patent stent, D1 50p, LCX 30p, patent mid stent, 90d/diffuse, RCA dominant, 30p, patent stent, 425m, EF 44%-->Med Rx.  . Essential hypertension   . Heart murmur    found during office visit  . Hyperlipidemia   . Hypothyroidism   . Mild sleep apnea   [place]   . Type II diabetes mellitus (HCC)     Tobacco Use: History  Smoking Status  . Never Smoker  Smokeless Tobacco  . Never Used    Labs: Recent Review Flowsheet Data    Labs for ITP Cardiac and Pulmonary Rehab Latest Ref Rng & Units 01/29/2015 01/29/2015 01/29/2015 01/29/2015 01/30/2015   Cholestrol 0 - 200 mg/dL - - - - -   LDLCALC 0 - 99 mg/dL - - - - -   LDLDIRECT mg/dL - - - - -   HDL >39.00 mg/dL - - - - -   Trlycerides 0.0 - 149.0 mg/dL - - - - -   Hemoglobin A1c 4.8 -  5.6 % - - - - -   PHART 7.350 - 7.450 7.431 7.397 7.355 - -   PCO2ART 35.0 - 45.0 mmHg 35.9 40.5 42.0 - -   HCO3 20.0 - 24.0 mEq/L 24.2(H) 25.1(H) 23.5 - -   TCO2 0 - 100 mmol/L 25 26 25 24 24    ACIDBASEDEF 0.0 - 2.0 mmol/L - - 2.0 - -   O2SAT % 99.0 99.0 98.0 - -       Exercise Target Goals:    Exercise Program Goal: Individual exercise prescription set with THRR, safety & activity barriers. Participant demonstrates ability to understand and report RPE using BORG scale, to self-measure pulse accurately, and to acknowledge the importance of the exercise prescription.  Exercise Prescription Goal: Starting with aerobic activity 30 plus minutes a day, 3 days per week for initial exercise prescription. Provide home exercise prescription and guidelines that participant acknowledges understanding prior to discharge.  Activity Barriers & Risk Stratification:   6 Minute Walk:     6  Minute Walk    Row Name 09/27/15 0848         6 Minute Walk   Phase Discharge     Distance 2205 feet     Distance % Change 29.7 %     Walk Time 6 minutes     # of Rest Breaks 0     MPH 4.18     METS 4.2     RPE 14     VO2 Peak 14.7     Symptoms No     Resting HR 69 bpm     Resting BP 136/70     Max Ex. HR 116 bpm     Max Ex. BP 150/82        Initial Exercise Prescription:   Perform Capillary Blood Glucose checks as needed.  Exercise Prescription Changes:      Exercise Prescription Changes    Row Name 04/02/15 0900 05/07/15 1000 06/09/15 0636 06/30/15 1200 07/14/15 1100     Exercise Review   Progression Yes Yes Yes Yes Yes     Response to Exercise   Blood Pressure (Admit) 142/82 128/78 152/88 126/78 130/74   Blood Pressure (Exercise) 140/80 144/78 168/80 132/62 158/62   Blood Pressure (Exit) 112/72 138/70 112/74 130/76 134/64   Heart Rate (Admit) 79 bpm - 82 bpm 88 bpm 77 bpm   Heart Rate (Exercise) 105 bpm 107 bpm 119 bpm 108 bpm 111 bpm   Heart Rate (Exit) 91 bpm 93 bpm 84 bpm 95 bpm 95 bpm   Rating of Perceived Exertion (Exercise) 13 10 12 11 12    Symptoms None None none none none   Comments Reviewed individualized exercise prescription and made increases per departmental policy. Exercise increases were discussed with the patient and they were able to perform the new work loads without issue (no signs or symptoms).  -  -  -  -   Duration Progress to 30 minutes of continuous aerobic without signs/symptoms of physical distress Progress to 45 minutes of aerobic exercise without signs/symptoms of physical distress Progress to 45 minutes of aerobic exercise without signs/symptoms of physical distress Progress to 30 minutes of continuous aerobic without signs/symptoms of physical distress Progress to 45 minutes of aerobic exercise without signs/symptoms of physical distress   Intensity Rest + 30 Rest + 30 Rest + 30 THRR New  113-146 THRR unchanged     Progression    Progression Continue progressive overload as per policy without signs/symptoms or physical  distress. Continue progressive overload as per policy without signs/symptoms or physical distress. Continue progressive overload as per policy without signs/symptoms or physical distress. Continue to progress workloads to maintain intensity without signs/symptoms of physical distress. Continue to progress workloads to maintain intensity without signs/symptoms of physical distress.   Average METs  -  -  - 5.25 6.39     Resistance Training   Training Prescription Yes Yes Yes Yes Yes   Weight 5 5 5 10 10    Reps 10-15 10-15 10-15 10-15 10-15     Interval Training   Interval Training  -  -  - Yes Yes   Equipment  -  -  - Treadmill Treadmill   Comments  -  -  - incline changes from 3.0-3.5 incline changes from 3.0-3.5     Treadmill   MPH 3.6 3.8 3.8 3.6 4.4   Grade 1 1 3.5  elevation ranges from 3-3.5 3 3.5   Minutes 40 45 45 35 45   METs  -  -  -  - 6.49     NuStep   Level  -  -  - 4  -   Watts  -  -  - 103  -   Minutes  -  -  - 97  -     Home Exercise Plan   Plans to continue exercise at  -  -  -  - Home  treadmil   Frequency  -  -  -  - Add 3 additional days to program exercise sessions.   Row Name 07/29/15 0800 08/11/15 1500 09/08/15 1400 09/23/15 1400       Exercise Review   Progression Yes Yes Yes Yes      Response to Exercise   Blood Pressure (Admit) 134/68 120/60 110/62 130/82    Blood Pressure (Exercise) 134/66  - 142/78 128/70    Blood Pressure (Exit) 126/66 134/64 106/60 124/74    Heart Rate (Admit) 85 bpm 63 bpm 82 bpm 77 bpm    Heart Rate (Exercise) 126 bpm 129 bpm 144 bpm 134 bpm    Heart Rate (Exit) 94 bpm 84 bpm 101 bpm 88 bpm    Rating of Perceived Exertion (Exercise) 14 14 13 13     Symptoms none none none none    Comments  - Reviewed individualized exercise prescription and made increases per departmental policy. Exercise increases were discussed with the patient and they  were able to perform the new work loads without issue (no signs or symptoms).  Reviewed individualized exercise prescription and made increases per departmental policy. Exercise increases were discussed with the patient and they were able to perform the new work loads without issue (no signs or symptoms).  Reviewed individualized exercise prescription and made increases per departmental policy. Exercise increases were discussed with the patient and they were able to perform the new work loads without issue (no signs or symptoms).     Duration Progress to 50 minutes of aerobic without signs/symptoms of physical distress Progress to 50 minutes of aerobic without signs/symptoms of physical distress Progress to 45 minutes of aerobic exercise without signs/symptoms of physical distress Progress to 45 minutes of aerobic exercise without signs/symptoms of physical distress    Intensity THRR unchanged THRR unchanged THRR unchanged THRR unchanged      Progression   Progression Continue to progress workloads to maintain intensity without signs/symptoms of physical distress. Continue to progress workloads to maintain intensity without signs/symptoms of physical distress. Continue to  progress workloads to maintain intensity without signs/symptoms of physical distress. Continue to progress workloads to maintain intensity without signs/symptoms of physical distress.    Average METs  - 6.79 6.79 6.79      Resistance Training   Training Prescription Yes Yes Yes Yes    Weight 10 10 10  lbs 10 lbs    Reps 10-15 10-15 10-15 10-15      Interval Training   Interval Training Yes Yes Yes Yes    Equipment Treadmill Treadmill Treadmill Treadmill    Comments incline changes 3 - 3.5 incline changes 3 - 3.5 incline changes 3 - 4.0 incline changes 3 - 4.0      Treadmill   MPH 4.4 4.4 4.4 4.4    Grade 4 4 4 4     Minutes 50 45 45 45    METs  - 6.79 6.79 6.79      Home Exercise Plan   Plans to continue exercise at  - Home   treadmil Home  treadmil Home  treadmil    Frequency  - Add 3 additional days to program exercise sessions. Add 3 additional days to program exercise sessions. Add 3 additional days to program exercise sessions.       Exercise Comments:      Exercise Comments    Row Name 05/19/15 0622 06/16/15 3212 06/30/15 1253 07/14/15 1025 08/11/15 1518   Exercise Comments Dashiel has nade increases in intensity and duration on the TM since starting the program. He now aims for 45-50 min on the TM as time allows in class, however he has been absent from class for 2 weeks and may need to lower his intensity or duration once he comes back to class after an extended absense.  Amrom has made some slight increases in his workload intensities on the TM. His vital signs have all been acceptable during exercise and he is tolerating his exercise well when he comes to class. He has not been attending regularly which will effect his progression.  [name]'s attendance has been sporadic.  He is using his treadmil at home.  He continues to make good progress on his exercise.  At next session, we will discuss increasing his intervals. [name] is up to 45-60 min on treadmill here in program and at home.  Today we talked about how his first few walks after surgery he could barely make.  He was pleased with how well he has comed during the program. [name] is doing intervals on the treadmill for 102mn.  He really is amazed at how well he is doing and feeling.  We will continue to monitor [name] for progress.  Today was [name]'s only visit since last review.   [place] 08/25/15 1035 09/01/15 1029 09/08/15 1433 09/23/15 1435 09/27/15 0858   Exercise Comments Pt has not attended since last review. [name] returned to day for the first time since 7/12.  He has been traveling for work and exercises daily. [name] continues to travel for work.  Has been absent since 09/01/15. [name] attended on 09/20/15.  His work schedule is starting to pick up again.  He plans to  graduate at his next visit, next week.  He has done great with exercise!  JClintongraduated today from cardiac rehab with 36 sessions completed.  Details of the patient's exercise prescription and what He needs to do in order to continue the prescription and progress were discussed with patient.  Patient was given a copy of prescription and goals.  Patient  verbalized understanding.  Mattia plans to continue to exercise by walking on his treadmill at home.      Discharge Exercise Prescription (Final Exercise Prescription Changes):     Exercise Prescription Changes - 09/23/15 1400      Exercise Review   Progression Yes     Response to Exercise   Blood Pressure (Admit) 130/82   Blood Pressure (Exercise) 128/70   Blood Pressure (Exit) 124/74   Heart Rate (Admit) 77 bpm   Heart Rate (Exercise) 134 bpm   Heart Rate (Exit) 88 bpm   Rating of Perceived Exertion (Exercise) 13   Symptoms none   Comments Reviewed individualized exercise prescription and made increases per departmental policy. Exercise increases were discussed with the patient and they were able to perform the new work loads without issue (no signs or symptoms).    Duration Progress to 45 minutes of aerobic exercise without signs/symptoms of physical distress   Intensity THRR unchanged     Progression   Progression Continue to progress workloads to maintain intensity without signs/symptoms of physical distress.   Average METs 6.79     Resistance Training   Training Prescription Yes   Weight 10 lbs   Reps 10-15     Interval Training   Interval Training Yes   Equipment Treadmill   Comments incline changes 3 - 4.0     Treadmill   MPH 4.4   Grade 4   Minutes 45   METs 6.79     Home Exercise Plan   Plans to continue exercise at Home  treadmil   Frequency Add 3 additional days to program exercise sessions.      Nutrition:  Target Goals: Understanding of nutrition guidelines, daily intake of sodium <1567m,  cholesterol <2029m calories 30% from fat and 7% or less from saturated fats, daily to have 5 or more servings of fruits and vegetables.  Biometrics:      Post Biometrics - 09/27/15 0851       Post  Biometrics   Height 5' 7.6" (1.717 m)   Weight 202 lb 1.6 oz (91.7 kg)   Waist Circumference 40 inches   Hip Circumference 41 inches   Waist to Hip Ratio 0.98 %   BMI (Calculated) 31.2      Nutrition Therapy Plan and Nutrition Goals:   Nutrition Discharge: Rate Your Plate Scores:     Nutrition Assessments - 09/27/15 1513      Rate Your Plate Scores   Post Score 78   Post Score % 87 %      Nutrition Goals Re-Evaluation:     Nutrition Goals Re-Evaluation    Row Name 04/02/15 0908             Personal Goal #1 Re-Evaluation   Goal Progress Seen Yes       Comments Jospeh said again today he doesn't feel like he needs to meet individually with a registered dietician since he knows what to do.          Personal Goal #2 Re-Evaluation   Goal Progress Seen Yes         Personal Goal #3 Re-Evaluation   Goal Progress Seen Yes          Psychosocial: Target Goals: Acknowledge presence or absence of depression, maximize coping skills, provide positive support system. Participant is able to verbalize types and ability to use techniques and skills needed for reducing stress and depression.  Initial Review & Psychosocial Screening:   Quality of  Life Scores:     Quality of Life - 09/27/15 1516      Quality of Life Scores   Health/Function Pre 26 %   Health/Function Post 28.83 %   Health/Function % Change 10.88 %   Socioeconomic Pre 27.86 %   Socioeconomic Post 29.64 %   Socioeconomic % Change  6.39 %   Psych/Spiritual Pre 28.29 %   Psych/Spiritual Post 28.93 %   Psych/Spiritual % Change 2.26 %   Family Pre 30 %   Family Post 30 %   Family % Change 0 %   GLOBAL Pre 27.44 %   GLOBAL Post 29.19 %   GLOBAL % Change 6.38 %      PHQ-9: Recent Review Flowsheet  Data    Depression screen [facility] 2/9 09/27/2015 03/08/2015   Decreased Interest 0 0   Down, Depressed, Hopeless 0 0   PHQ - 2 Score 0 0   Altered sleeping 0 2   Tired, decreased energy 0 0   Change in appetite 0 0   Feeling bad or failure about yourself  0 0   Trouble concentrating 0 0   Moving slowly or fidgety/restless 0 0   PHQ-9 Score 0 2   Difficult doing work/chores - Not difficult at all      Psychosocial Evaluation and Intervention:     Psychosocial Evaluation - 04/21/15 0945      Psychosocial Evaluation & Interventions   Interventions Encouraged to exercise with the program and follow exercise prescription;Stress management education   Comments Counselor met with Mr. Ramthun today for initial psychosocial evaluation.  He is a 60 year old well-adjusted gentleman who had bypass surgery on Dec. 30, 2016.  He has a strong support system with a spouse of 36 years and adult children who live close by.  He is also actively involved in his local church.  Mr. Dolores reports he sleeps well; has a good appetite and denies a history of or current symptoms of anxiety or depression.  He states his mood is typically positive and he has minimal stress other than some work related issues.  Mr. Dilks has goals to be more educated and increase his stamina and strength while in this program.  He has home gym equipment and walks outside typically up to one hour each day so plans to maintain this following completion of this program.     Continued Psychosocial Services Needed Yes  Mr. Pattillo will benefit from the psychoeducational components of this program - particularly stress management.        Psychosocial Re-Evaluation:     Psychosocial Re-Evaluation    [place] Name 04/02/15 0910 06/16/15 1005 06/16/15 1006         Psychosocial Re-Evaluation   Interventions Encouraged to attend Cardiac Rehabilitation for the exercise  -  -     Comments [name] said he feel everything is going well in Cardiac  Rehab for him and he has been able to attend frequently.  Follow up with Mr. Wuertz today reporting feeling stronger and more stamina since coming into this program.  He also reports he has purchased a tread mill and is working out at home on days that he cannot be here which is helping as well.  Mr. Delane Ginger Follow up with Mr. Armwood today reporting feeling stronger and more stamina since coming into this program.  He also reports he has purchased a tread mill and is working out at home on days that he cannot be here  which is helping as well.  Mr. Delane Ginger continues to have a positive mood and minimal stress - other than work at times.          Vocational Rehabilitation: Provide vocational rehab assistance to qualifying candidates.   Vocational Rehab Evaluation & Intervention:   Education: Education Goals: Education classes will be provided on a weekly basis, covering required topics. Participant will state understanding/return demonstration of topics presented.  Learning Barriers/Preferences:   Education Topics: General Nutrition Guidelines/Fats and Fiber: -Group instruction provided by verbal, written material, models and posters to present the general guidelines for heart healthy nutrition. Gives an explanation and review of dietary fats and fiber.   Controlling Sodium/Reading Food Labels: -Group verbal and written material supporting the discussion of sodium use in heart healthy nutrition. Review and explanation with models, verbal and written materials for utilization of the food label.   Exercise Physiology & Risk Factors: - Group verbal and written instruction with models to review the exercise physiology of the cardiovascular system and associated critical values. Details cardiovascular disease risk factors and the goals associated with each risk factor. Flowsheet Row Cardiac Rehab from 09/27/2015 in [facility] Cardiac and Pulmonary Rehab  Date  07/14/15  Educator  [facility]  Instruction Review Code   2- meets goals/outcomes      Aerobic Exercise & Resistance Training: - Gives group verbal and written discussion on the health impact of inactivity. On the components of aerobic and resistive training programs and the benefits of this training and how to safely progress through these programs. Flowsheet Row Cardiac Rehab from 09/27/2015 in [facility] Cardiac and Pulmonary Rehab  Date  09/09/15  Educator  AS  Instruction Review Code  2- meets goals/outcomes      Flexibility, Balance, General Exercise Guidelines: - Provides group verbal and written instruction on the benefits of flexibility and balance training programs. Provides general exercise guidelines with specific guidelines to those with heart or lung disease. Demonstration and skill practice provided. Flowsheet Row Cardiac Rehab from 09/27/2015 in [facility] Cardiac and Pulmonary Rehab  Date  07/21/15  Educator  AS  Instruction Review Code  R- Review/reinforce [Second Class]      Stress Management: - Provides group verbal and written instruction about the health risks of elevated stress, cause of high stress, and healthy ways to reduce stress.   Depression: - Provides group verbal and written instruction on the correlation between heart/lung disease and depressed mood, treatment options, and the stigmas associated with seeking treatment. Flowsheet Row Cardiac Rehab from 09/27/2015 in [facility] Cardiac and Pulmonary Rehab  Date  06/30/15 [Second Class]  Educator  [facility]  Instruction Review Code  R- Review/reinforce      Anatomy & Physiology of the Heart: - Group verbal and written instruction and models provide basic cardiac anatomy and physiology, with the coronary electrical and arterial systems. Review of: AMI, Angina, Valve disease, Heart Failure, Cardiac Arrhythmia, Pacemakers, and the ICD. Flowsheet Row Cardiac Rehab from 09/27/2015 in [facility] Cardiac and Pulmonary Rehab  Date  09/20/15  Educator  SB  Instruction Review Code  2- meets  goals/outcomes      Cardiac Procedures: - Group verbal and written instruction and models to describe the testing methods done to diagnose heart disease. Reviews the outcomes of the test results. Describes the treatment choices: Medical Management, Angioplasty, or Coronary Bypass Surgery. Flowsheet Row Cardiac Rehab from 09/27/2015 in [facility] Cardiac and Pulmonary Rehab  Date  09/27/15  Educator  SB  Instruction Review Code  2-  meets goals/outcomes      Cardiac Medications: - Group verbal and written instruction to review commonly prescribed medications for heart disease. Reviews the medication, class of the drug, and side effects. Includes the steps to properly store meds and maintain the prescription regimen. Flowsheet Row Cardiac Rehab from 09/27/2015 in [facility] Cardiac and Pulmonary Rehab  Date  06/16/15  Educator  SB  Instruction Review Code  2- meets goals/outcomes [Part II:  Cardiac Medications]      Go Sex-Intimacy & Heart Disease, Get SMART - Goal Setting: - Group verbal and written instruction through game format to discuss heart disease and the return to sexual intimacy. Provides group verbal and written material to discuss and apply goal setting through the application of the S.M.A.R.T. Method. Flowsheet Row Cardiac Rehab from 09/27/2015 in [facility] Cardiac and Pulmonary Rehab  Date  09/27/15  Educator  SB  Instruction Review Code  2- meets goals/outcomes      Other Matters of the Heart: - Provides group verbal, written materials and models to describe Heart Failure, Angina, Valve Disease, and Diabetes in the realm of heart disease. Includes description of the disease process and treatment options available to the cardiac patient. Flowsheet Row Cardiac Rehab from 09/27/2015 in [facility] Cardiac and Pulmonary Rehab  Date  09/20/15  Educator  SB  Instruction Review Code  2- meets goals/outcomes      Exercise & Equipment Safety: - Individual verbal instruction and demonstration of  equipment use and safety with use of the equipment. Flowsheet Row Cardiac Rehab from 09/27/2015 in [facility] Cardiac and Pulmonary Rehab  Date  03/08/15  Educator  C. EnterkinRN  Instruction Review Code  2- meets goals/outcomes      Infection Prevention: - Provides verbal and written material to individual with discussion of infection control including proper hand washing and proper equipment cleaning during exercise session. Flowsheet Row Cardiac Rehab from 09/27/2015 in [facility] Cardiac and Pulmonary Rehab  Date  03/08/15  Educator  C. EnterkinRN  Instruction Review Code  2- meets goals/outcomes      Falls Prevention: - Provides verbal and written material to individual with discussion of falls prevention and safety. Flowsheet Row Cardiac Rehab from 09/27/2015 in [facility] Cardiac and Pulmonary Rehab  Date  03/08/15  Educator  C. [place]  Instruction Review Code  2- meets goals/outcomes      Diabetes: - Individual verbal and written instruction to review signs/symptoms of diabetes, desired ranges of glucose level fasting, after meals and with exercise. Advice that pre and post exercise glucose checks will be done for 3 sessions at entry of program. Flowsheet Row Cardiac Rehab from 09/27/2015 in [facility] Cardiac and Pul[facility] Rehab  Date  03/08/15  Educator  Loletha Grayer ENterkinRN  Instruction Review Code  1- partially meets, needs review/practice       Knowledge Questionnaire Score:     Knowledge Questionnaire Score - 09/27/15 1513      Knowledge Questionnaire Score   Pre Score 26/28   Post Score 27/28      Core Components/Risk Factors/Patient Goals at Admission:   Core Components/Risk Factors/Patient Goals Review:      Goals and Risk Factor Review    Row Name 04/02/15 0909 06/16/15 0935 06/16/15 0952 07/14/15 1021 08/11/15 0946     Core Components/Risk Factors/Patient Goals Review   Personal Goals Review Diabetes;Hypertension;Lipids Diabetes;Hypertension;Lipids;Weight  Management/Obesity Weight Management/Obesity Hypertension;Lipids;Diabetes Sedentary;Increase Strength and Stamina;Hypertension;Diabetes;Lipids   Review [name] reports his HBA1c was done yesterday adn uch better down to 6.2 from he  reported 11 in the hospital. He reported his cholestrol level yestersay was 133. His blood pressure is 140/80 walking on the treadmill  at 3.19mh. [name]'s goal for his weight is 190 pounds or less.  [name] states his FSBS this a.m. was 140.  He reports it being higher now that he has returned to work and is traveling.  He is working on better fTherapist, nutritionalwhile traveling.  He states he eats a lot of salads and soups.  He also stated balsamic vinegarette dressings is not without sugar nor is low fat ranch.  The best dressing he has found for his diabetes is oil and vinegar.  BP  during check in to Cardiac  Rehab  today was 132/76.  BP during workout on NS was 132/70.  [name] reports cholesterol check from  March 17 was around 156.  [name] states being in Cardiac Rehab  program has helped him to "get off his butt" and start moving.  He and his wife purchased a treamill for his home and he is now using that at home.  Finally, [name] informed RN that he did not think he would learn anything from the Cardiac Rehab education sessions, but he stated, "I have learned something from every session."    - [name]'s blood sugars have been up recently but he is montioring them daily. He is taking his statins and having no problems. His blood pressures have been between 120-130/70-80 at rest at home and here in program.   JWille Glasersays he is feeling great and doing well with getting back into exercise.  He had a follow appt with labs on Monday and A1c was 7.9, Cholesterol numbers were all in target.  His blood pressures have been stable in the 120s/60-70s.   Expected Outcomes  - Continue Cardiac Rehab program; continue exercise prescription; continue cholesterol and BP medications as prescribed; continue exercising at home  in addition to Cardiac Rehab; continue with low cholesterol low fat diabetic diet to achieve heart healthy lifestyle and overall weight goals.    - [name] will continue to see improvement in his numbers as he continues to exercise and make lifestyle changes. [name] will continue to come to rehab and continue to make improvements in his exercise.   RPixleyName 09/01/15 1030             Core Components/Risk Factors/Patient Goals Review   Personal Goals Review Hypertension;Diabetes;Lipids       Review [name]'s blood sugars usualy range in the 140s-150s.  His blood pressure has stayed around the 120s/60s.  He has not had any recent blood work.       Expected Outcomes [name] will continue to attend exercise and education classes.          Core Components/Risk Factors/Patient Goals at Discharge (Final Review):      Goals and Risk Factor Review - 09/01/15 1030      Core Components/Risk Factors/Patient Goals Review   Personal Goals Review Hypertension;Diabetes;Lipids   Review [name]'s blood sugars usualy range in the 140s-150s.  His blood pressure has stayed around the 120s/60s.  He has not had any recent blood work.   Expected Outcomes [name] will continue to attend exercise and education classes.      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 He is able to walk on the treadmill today at 3.49mh  today with no problems.  30 Day Review. Continue with the ITP. 30 day review.  Continue with ITP  last visit 05/07/2015 30 day review. Continue with ITP.  2 visits since last review 30 day review.  Continue with ITP  Sporadic attendance  because of work schedule   Row Name 08/18/15 0733 08/25/15 1035 09/15/15 0753 09/27/15 1044     ITP Comments 30 day review. Continue with ITP. Atetndance sporadic because of work schedule Pt has not  attended since last review. Left message on cell phone. 30 day review. Continue with ITP unless changes noted by Medical Director at signature of review. Discharged today       Comments: Discharge ITP

## 2015-09-27 NOTE — Patient Instructions (Signed)
Discharge Instructions  Patient Details  Name: Juan [name] MRN: [id number] Date of Birth: Dec 12, 1955 Referring Provider:  Minna Merritts, MD   Number of Visits: 52  Reason for Discharge:  Patient reached a stable level of exercise. Patient independent in their exercise.  Smoking History:  History  Smoking Status  . Never Smoker  Smokeless Tobacco  . Never Used    Diagnosis:  S/P CABG x 4  Initial Exercise Prescription:   Discharge Exercise Prescription (Final Exercise Prescription Changes):     Exercise Prescription Changes - 09/23/15 1400      Exercise Review   Progression Yes     Response to Exercise   Blood Pressure (Admit) 130/82   Blood Pressure (Exercise) 128/70   Blood Pressure (Exit) 124/74   Heart Rate (Admit) 77 bpm   Heart Rate (Exercise) 134 bpm   Heart Rate (Exit) 88 bpm   Rating of Perceived Exertion (Exercise) 13   Symptoms none   Comments Reviewed individualized exercise prescription and made increases per departmental policy. Exercise increases were discussed with the patient and they were able to perform the new work loads without issue (no signs or symptoms).    Duration Progress to 45 minutes of aerobic exercise without signs/symptoms of physical distress   Intensity THRR unchanged     Progression   Progression Continue to progress workloads to maintain intensity without signs/symptoms of physical distress.   Average METs 6.79     Resistance Training   Training Prescription Yes   Weight 10 lbs   Reps 10-15     Interval Training   Interval Training Yes   Equipment Treadmill   Comments incline changes 3 - 4.0     Treadmill   MPH 4.4   Grade 4   Minutes 45   METs 6.79     Home Exercise Plan   Plans to continue exercise at Home  treadmil   Frequency Add 3 additional days to program exercise sessions.      Functional Capacity:     6 Minute Walk    Row Name 09/27/15 0848         6 Minute Walk   Phase Discharge      Distance 2205 feet     Distance % Change 29.7 %     Walk Time 6 minutes     # of Rest Breaks 0     MPH 4.18     METS 4.2     RPE 14     VO2 Peak 14.7     Symptoms No     Resting HR 69 bpm     Resting BP 136/70     Max Ex. HR 116 bpm     Max Ex. BP 150/82        Quality of Life:   Personal Goals: Goals established at orientation with interventions provided to work toward goal.    Personal Goals Discharge:     Goals and Risk Factor Review - 09/01/15 1030      Core Components/Risk Factors/Patient Goals Review   Personal Goals Review Hypertension;Diabetes;Lipids   Review [name]'s blood sugars usualy range in the 140s-150s.  His blood pressure has stayed around the 120s/60s.  He has not had any recent blood work.   Expected Outcomes [name] will continue to attend exercise and education classes.      Nutrition & Weight - Outcomes:      Post Biometrics - 09/27/15 [id number]       Post  Biometrics   Height 5' 7.6" (1.717 m)   Weight 202 lb 1.6 oz (91.7 kg)   Waist Circumference 40 inches   Hip Circumference 41 inches   Waist to Hip Ratio 0.98 %   BMI (Calculated) 31.2      Nutrition:   Nutrition Discharge:   Education Questionnaire Score:   Goals reviewed with patient; copy given to patient.

## 2015-09-27 NOTE — Progress Notes (Signed)
Daily Session Note  Patient Details  Name: Juan [name] MRN: [id number] Date of Birth: Apr 24, 1955 Referring Provider:    Encounter Date: 09/27/2015  Check In:     Session Check In - 09/27/15 0821      Check-In   Location [facility]-Cardiac & Pulmonary Rehab   Staff Present Heath Lark, RN, BSN, Laveda Norman, BS, ACSM CEP, Exercise Physiologist;Jessica Edison, [place], ACSM RCEP, Exercise Physiologist   Supervising physician immediately available to respond to emergencies See telemetry face sheet for immediately available ER MD   Medication changes reported     No   Fall or balance concerns reported    No   Warm-up and Cool-down Performed on first and last piece of equipment   Resistance Training Performed Yes   VAD Patient? No     Pain Assessment   Currently in Pain? No/denies   Multiple Pain Sites No         Goals Met:  Independence with exercise equipment Exercise tolerated well Personal goals reviewed No report of cardiac concerns or symptoms Strength training completed today  Goals Unmet:  Not Applicable  Comments: Pt able to follow exercise prescription today without complaint.  Will continue to monitor for progression.   Juan [name] graduated today from cardiac rehab with 36 sessions completed.  Details of the patient's exercise prescription and what He needs to do in order to continue the prescription and progress were discussed with patient.  Patient was given a copy of prescription and goals.  Patient verbalized understanding.  Juan [name] plans to continue to exercise by walking on his treadmill at home.     Dr. Emily Filbert is Medical Director for [facility].

## 2015-09-27 NOTE — Progress Notes (Addendum)
Discharge Summary  Patient Details  Name: Juan [name] MRN: [id number] Date of Birth: [birth year] Referring Provider:     Number of Visits: 28   Reason for Discharge:  Early Exit:  Back to work  Smoking History:  History  Smoking Status  . Never Smoker  Smokeless Tobacco  . Never Used    Diagnosis:  S/P CABG x 4  ADL UCSD:   Initial Exercise Prescription:   Discharge Exercise Prescription (Final Exercise Prescription Changes):     Exercise Prescription Changes - 09/23/15 1400      Exercise Review   Progression Yes     Response to Exercise   Blood Pressure (Admit) 130/82   Blood Pressure (Exercise) 128/70   Blood Pressure (Exit) 124/74   Heart Rate (Admit) 77 bpm   Heart Rate (Exercise) 134 bpm   Heart Rate (Exit) 88 bpm   Rating of Perceived Exertion (Exercise) 13   Symptoms none   Comments Reviewed individualized exercise prescription and made increases per departmental policy. Exercise increases were discussed with the patient and they were able to perform the new work loads without issue (no signs or symptoms).    Duration Progress to 45 minutes of aerobic exercise without signs/symptoms of physical distress   Intensity THRR unchanged     Progression   Progression Continue to progress workloads to maintain intensity without signs/symptoms of physical distress.   Average METs 6.79     Resistance Training   Training Prescription Yes   Weight 10 lbs   Reps 10-15     Interval Training   Interval Training Yes   Equipment Treadmill   Comments incline changes 3 - 4.0     Treadmill   MPH 4.4   Grade 4   Minutes 45   METs 6.79     Home Exercise Plan   Plans to continue exercise at Home  treadmil   Frequency Add 3 additional days to program exercise sessions.      Functional Capacity:     6 Minute Walk    Row Name 09/27/15 0848         6 Minute Walk   Phase Discharge     Distance 2205 feet     Distance % Change 29.7 %     Walk Time 6  minutes     # of Rest Breaks 0     MPH 4.18     METS 4.2     RPE 14     VO2 Peak 14.7     Symptoms No     Resting HR 69 bpm     Resting BP 136/70     Max Ex. HR 116 bpm     Max Ex. BP 150/82        Psychological, QOL, Others - Outcomes: PHQ 2/9: Depression screen [facility] 2/9 09/27/2015 03/08/2015  Decreased Interest 0 0  Down, Depressed, Hopeless 0 0  PHQ - 2 Score 0 0  Altered sleeping 0 2  Tired, decreased energy 0 0  Change in appetite 0 0  Feeling bad or failure about yourself  0 0  Trouble concentrating 0 0  Moving slowly or fidgety/restless 0 0  PHQ-9 Score 0 2  Difficult doing work/chores - Not difficult at all    Quality of Life:     Quality of Life - 09/27/15 1516      Quality of Life Scores   Health/Function Pre 26 %   Health/Function Post 28.83 %   Health/Function %  Change 10.88 %   Socioeconomic Pre 27.86 %   Socioeconomic Post 29.64 %   Socioeconomic % Change  6.39 %   Psych/Spiritual Pre 28.29 %   Psych/Spiritual Post 28.93 %   Psych/Spiritual % Change 2.26 %   Family Pre 30 %   Family Post 30 %   Family % Change 0 %   GLOBAL Pre 27.44 %   GLOBAL Post 29.19 %   GLOBAL % Change 6.38 %      Personal Goals: Goals established at orientation with interventions provided to work toward goal.    Personal Goals Discharge:     Goals and Risk Factor Review    Row Name 04/02/15 0909 06/16/15 0935 06/16/15 0952 07/14/15 1021 08/11/15 0946     Core Components/Risk Factors/Patient Goals Review   Personal Goals Review Diabetes;Hypertension;Lipids Diabetes;Hypertension;Lipids;Weight Management/Obesity Weight Management/Obesity Hypertension;Lipids;Diabetes Sedentary;Increase Strength and Stamina;Hypertension;Diabetes;Lipids   Review Juan [name] reports his HBA1c was done yesterday adn uch better down to 6.2 from he reported 11 in the hospital. He reported his cholestrol level yestersay was 133. His blood pressure is 140/80 walking on the treadmill  at 3.39mph. Juan [name]'s goal  for his weight is 190 pounds or less.  Juan [name] states his FSBS this a.m. was 140.  He reports it being higher now that he has returned to work and is traveling.  He is working on better Therapist, nutritional while traveling.  He states he eats a lot of salads and soups.  He also stated balsamic vinegarette dressings is not without sugar nor is low fat ranch.  The best dressing he has found for his diabetes is oil and vinegar.  BP  during check in to Cardiac  Rehab  today was 132/76.  BP during workout on NS was 132/70.  Juan [name] reports cholesterol check from  March 17 was around 156.  Juan [name] states being in Cardiac Rehab  program has helped him to "get off his butt" and start moving.  He and his wife purchased a treamill for his home and he is now using that at home.  Finally, Juan [name] informed RN that he did not think he would learn anything from the Cardiac Rehab education sessions, but he stated, "I have learned something from every session."    - Juan [name]'s blood sugars have been up recently but he is montioring them daily. He is taking his statins and having no problems. His blood pressures have been between 120-130/70-80 at rest at home and here in program.   Juan [name] says he is feeling great and doing well with getting back into exercise.  He had a follow appt with labs on Monday and A1c was 7.9, Cholesterol numbers were all in target.  His blood pressures have been stable in the 120s/60-70s.   Expected Outcomes  - Continue Cardiac Rehab program; continue exercise prescription; continue cholesterol and BP medications as prescribed; continue exercising at home in addition to Cardiac Rehab; continue with low cholesterol low fat diabetic diet to achieve heart healthy lifestyle and overall weight goals.    - Juan [name] will continue to see improvement in his numbers as he continues to exercise and make lifestyle changes. Juan [name] will continue to come to rehab and continue to make improvements in his exercise.   [place] Name 09/01/15 1030              Core Components/Risk Factors/Patient Goals Review   Personal Goals Review Hypertension;Diabetes;Lipids       Review Juan [name]'s blood sugars usualy range in the 140s-150s.  His blood pressure has stayed around the 120s/60s.  He has not had any recent blood work.       Expected Outcomes Juan [name] will continue to attend exercise and education classes.          Nutrition & Weight - Outcomes:      Post Biometrics - 09/27/15 0851       Post  Biometrics   Height 5' 7.6" (1.717 m)   Weight 202 lb 1.6 oz (91.7 kg)   Waist Circumference 40 inches   Hip Circumference 41 inches   Waist to Hip Ratio 0.98 %   BMI (Calculated) 31.2      Nutrition:   Nutrition Discharge:     Nutrition Assessments - 09/27/15 1513      Rate Your Plate Scores   Post Score 78   Post Score % 87 %      Education Questionnaire Score:     Knowledge Questionnaire Score - 09/27/15 1513      Knowledge Questionnaire Score   Pre Score 26/28   Post Score 27/28      Goals reviewed with patient; copy given to patient.

## 2015-10-13 ENCOUNTER — Encounter: Payer: Self-pay | Admitting: *Deleted

## 2015-10-13 NOTE — Progress Notes (Signed)
Discharge Summary  Patient Details  Name: Juan [name] MRN: [id number] Date of Birth: 1955-10-10 Referring Provider:     Number of Visits:   Reason for Discharge:  Patient reached a stable level of exercise.  Smoking History:  History  Smoking Status  . Never Smoker  Smokeless Tobacco  . Never Used    Diagnosis:  No diagnosis found.  ADL UCSD:   Initial Exercise Prescription:   Discharge Exercise Prescription (Final Exercise Prescription Changes):     Exercise Prescription Changes - 09/23/15 1400      Exercise Review   Progression Yes     Response to Exercise   Blood Pressure (Admit) 130/82   Blood Pressure (Exercise) 128/70   Blood Pressure (Exit) 124/74   Heart Rate (Admit) 77 bpm   Heart Rate (Exercise) 134 bpm   Heart Rate (Exit) 88 bpm   Rating of Perceived Exertion (Exercise) 13   Symptoms none   Comments Reviewed individualized exercise prescription and made increases per departmental policy. Exercise increases were discussed with the patient and they were able to perform the new work loads without issue (no signs or symptoms).    Duration Progress to 45 minutes of aerobic exercise without signs/symptoms of physical distress   Intensity THRR unchanged     Progression   Progression Continue to progress workloads to maintain intensity without signs/symptoms of physical distress.   Average METs 6.79     Resistance Training   Training Prescription Yes   Weight 10 lbs   Reps 10-15     Interval Training   Interval Training Yes   Equipment Treadmill   Comments incline changes 3 - 4.0     Treadmill   MPH 4.4   Grade 4   Minutes 45   METs 6.79     Home Exercise Plan   Plans to continue exercise at Home  treadmil   Frequency Add 3 additional days to program exercise sessions.      Functional Capacity:     6 Minute Walk    Row Name 09/27/15 0848         6 Minute Walk   Phase Discharge     Distance 2205 feet     Distance % Change  29.7 %     Walk Time 6 minutes     # of Rest Breaks 0     MPH 4.18     METS 4.2     RPE 14     VO2 Peak 14.7     Symptoms No     Resting HR 69 bpm     Resting BP 136/70     Max Ex. HR 116 bpm     Max Ex. BP 150/82        Psychological, QOL, Others - Outcomes: PHQ 2/9: Depression screen [facility] 2/9 09/27/2015 03/08/2015  Decreased Interest 0 0  Down, Depressed, Hopeless 0 0  PHQ - 2 Score 0 0  Altered sleeping 0 2  Tired, decreased energy 0 0  Change in appetite 0 0  Feeling bad or failure about yourself  0 0  Trouble concentrating 0 0  Moving slowly or fidgety/restless 0 0  PHQ-9 Score 0 2  Difficult doing work/chores - Not difficult at all    Quality of Life:     Quality of Life - 09/27/15 1516      Quality of Life Scores   Health/Function Pre 26 %   Health/Function Post 28.83 %   Health/Function % Change  10.88 %   Socioeconomic Pre 27.86 %   Socioeconomic Post 29.64 %   Socioeconomic % Change  6.39 %   Psych/Spiritual Pre 28.29 %   Psych/Spiritual Post 28.93 %   Psych/Spiritual % Change 2.26 %   Family Pre 30 %   Family Post 30 %   Family % Change 0 %   GLOBAL Pre 27.44 %   GLOBAL Post 29.19 %   GLOBAL % Change 6.38 %      Personal Goals: Goals established at orientation with interventions provided to work toward goal.    Personal Goals Discharge:     Goals and Risk Factor Review    Row Name 09/01/15 1030             Core Components/Risk Factors/Patient Goals Review   Personal Goals Review Hypertension;Diabetes;Lipids       Review Juan [name]'s blood sugars usualy range in the 140s-150s.  His blood pressure has stayed around the 120s/60s.  He has not had any recent blood work.       Expected Outcomes Juan [name] will continue to attend exercise and education classes.          Nutrition & Weight - Outcomes:      Post Biometrics - 09/27/15 0851       Post  Biometrics   Height 5' 7.6" (1.717 m)   Weight 202 lb 1.6 oz (91.7 kg)   Waist Circumference 40  inches   Hip Circumference 41 inches   Waist to Hip Ratio 0.98 %   BMI (Calculated) 31.2      Nutrition:   Nutrition Discharge:     Nutrition Assessments - 09/27/15 1513      Rate Your Plate Scores   Post Score 78   Post Score % 87 %      Education Questionnaire Score:     Knowledge Questionnaire Score - 09/27/15 1513      Knowledge Questionnaire Score   Pre Score 26/28   Post Score 27/28      Goals reviewed with patient; copy given to patient.

## 2015-10-28 ENCOUNTER — Ambulatory Visit (INDEPENDENT_AMBULATORY_CARE_PROVIDER_SITE_OTHER): Payer: BLUE CROSS/BLUE SHIELD | Admitting: Cardiovascular Disease

## 2015-10-28 ENCOUNTER — Encounter: Payer: Self-pay | Admitting: Cardiovascular Disease

## 2015-10-28 VITALS — BP 122/70 | HR 73 | Ht 68.0 in | Wt 203.5 lb

## 2015-10-28 DIAGNOSIS — I10 Essential (primary) hypertension: Secondary | ICD-10-CM

## 2015-10-28 DIAGNOSIS — I480 Paroxysmal atrial fibrillation: Secondary | ICD-10-CM | POA: Diagnosis not present

## 2015-10-28 DIAGNOSIS — I2510 Atherosclerotic heart disease of native coronary artery without angina pectoris: Secondary | ICD-10-CM

## 2015-10-28 DIAGNOSIS — IMO0002 Reserved for concepts with insufficient information to code with codable children: Secondary | ICD-10-CM

## 2015-10-28 DIAGNOSIS — E785 Hyperlipidemia, unspecified: Secondary | ICD-10-CM

## 2015-10-28 DIAGNOSIS — Z794 Long term (current) use of insulin: Secondary | ICD-10-CM

## 2015-10-28 DIAGNOSIS — Z951 Presence of aortocoronary bypass graft: Secondary | ICD-10-CM

## 2015-10-28 DIAGNOSIS — E1165 Type 2 diabetes mellitus with hyperglycemia: Secondary | ICD-10-CM

## 2015-10-28 DIAGNOSIS — E118 Type 2 diabetes mellitus with unspecified complications: Secondary | ICD-10-CM

## 2015-10-28 NOTE — Patient Instructions (Signed)

## 2015-10-28 NOTE — Progress Notes (Signed)
Cardiology Office Note  Date:  10/28/2015   ID:  Juan [name], DOB 24-Sep-1955, MRN [id number]  PCP:  Osborne Casco, MD   Chief Complaint  Patient presents with  . other    6 month f/u. Meds reviewed verbally with pt.    HPI:  Juan [name] is a very pleasant 60 year old male with a history of premature coronary artery disease, multiple cardiac catheterizations with intervention in the past dating back to 1994, total of 8 stents, History of diabetes and hyperlipidemia,  Who presented to  [facility] with angina,  Cardiac catheterization showing three-vessel disease,  Preferred to [facility] for CABG. 01/29/15 He presents today for follow-up of his coronary artery disease  On his last office visit, hemoglobin A1c low 6 range He reports recent lab work showing A1c high 7 range Reports he went back to work, eating the wrong foods, less exercise Working with PMD to get his numbers down Thinks they are doing better now Cholesterol declined from 133 now in the 150 range per the patient  Denies any chest pain concerning for angina No new symptoms, no shortness of breath on exertion  No smoking history. Reports his father also had coronary artery disease in his 105s.  EKG on today's visit shows normal sinus rhythm with rate 73 bpm, T-wave abnormality V5, V6, 1 and aVL, consider old inferior MI  Other past medical history reviewed Coronary artery bypass grafting x4 (left internal mammary artery to LAD, saphenous vein graft to posterior descending, saphenous vein graft to OM1, saphenous vein graft to OM2)  withEndoscopic harvest of bilateral leg greater saphenous vein by Dr. Prescott Gum on 01/29/2015.   cardiac catheterization 01/21/2015 showed            Prox RCA to Mid RCA lesion, 70% stenosed. The lesion was previously treated with a stent (unknown type), Prox RCA lesion, 70% stenosed.            Mid RCA lesion, 90% stenosed.            Mid Cx lesion, 90% stenosed, Ost Cx lesion, 90%  stenosed.Ost 1st Mrg to 1st Mrg lesion, 90% stenosed.            Mid LAD lesion, 80% stenosed, 1st Diag lesion, 70% stenosed.  1. Severe diffuse calcified three-vessel coronary artery disease with previous multivessel stenting. 2. Normal LV systolic function and mildly elevated left ventricular end-diastolic pressure.   postoperatively he had brief period of atrial tachycardia/atrial fibrillation, started on amiodarone,  Converted to normal sinus rhythm.    PMH:   has a past medical history of Asthma; Chronic kidney disease; Coronary artery disease; Essential hypertension; Heart murmur; Hyperlipidemia; Hypothyroidism; Mild sleep apnea; Myocardial infarction ([place]) (1996); and Type II diabetes mellitus ([place]).  PSH:    Past Surgical History:  Procedure Laterality Date  . [place]; Dr.Harold; [facility].  . [place]; Dr. Joneen Boers; [facility]  . CARDIAC CATHETERIZATION  1996   ""  . CARDIAC CATHETERIZATION     ''''  . CARDIAC CATHETERIZATION     '''  . CARDIAC CATHETERIZATION     '''  . CARDIAC CATHETERIZATION  2002   '''  . CARDIAC CATHETERIZATION  2006   [facility]; [place].   Marland Kitchen CARDIAC CATHETERIZATION N/A 01/21/2015   Procedure: Left Heart Cath and Coronary Angiography;  Surgeon: Wellington Hampshire, MD;  Location: [place] CV [facility];  Service: Cardiovascular;  Laterality: N/A;  .  cardiac stent     x 10  . CORONARY ARTERY BYPASS GRAFT N/A 01/29/2015   Procedure: CORONARY ARTERY BYPASS GRAFTING (CABG)x four using left internal mammary artery artery and bilateral saphenous thigh vein using endoscope.;  Surgeon: Ivin Poot, MD;  Location: [place];  Service: Open Heart Surgery;  Laterality: N/A;  . CYST EXCISION     right- wrist- ganglion   . NASAL SEPTUM SURGERY    . TEE WITHOUT CARDIOVERSION N/A 01/29/2015   Procedure: TRANSESOPHAGEAL ECHOCARDIOGRAM (TEE);  Surgeon: Ivin Poot, MD;  Location: [place];  Service: Open Heart Surgery;  Laterality: N/A;  . THYROIDECTOMY      Current Outpatient Prescriptions  Medication Sig Dispense Refill  . aspirin 81 MG tablet Take 81 mg by mouth daily.    . clopidogrel (PLAVIX) 75 MG tablet Take 1 tablet (75 mg total) by mouth daily. 30 tablet 3  . Insulin Glargine (LANTUS) 100 UNIT/ML Solostar Pen Inject 24 Units into the skin daily at 10 pm. (Patient taking differently: Inject 30 Units into the skin daily at 10 pm. ) 15 mL 11  . levothyroxine (SYNTHROID) 100 MCG tablet Take 1 tablet (100 mcg total) by mouth daily before breakfast. 90 tablet 0  . metFORMIN (GLUCOPHAGE-XR) 500 MG 24 hr tablet Take 1 tablet (500 mg total) by mouth 2 (two) times daily with a meal. 60 tablet 1  . metoprolol tartrate (LOPRESSOR) 50 MG tablet Take 1 tablet (50 mg total) by mouth 2 (two) times daily. 180 tablet 3  . Multiple Vitamin (MULTIVITAMIN) tablet Take 1 tablet by mouth 2 (two) times daily.     . nitroGLYCERIN (NITROSTAT) 0.4 MG SL tablet Place 0.4 mg under the tongue every 5 (five) minutes as needed for chest pain.    . Omega-3 Fatty Acids (FISH OIL PO) Take 1,800 mg by mouth daily.     . ramipril (ALTACE) 5 MG capsule Take 1 capsule (5 mg total) by mouth daily. 90 capsule 3  . simvastatin (ZOCOR) 40 MG tablet TAKE 1 BY MOUTH DAILY 90 tablet 3   No current facility-administered medications for this visit.      Allergies:   Review of patient's allergies indicates no known allergies.   Social History:  The patient  reports that he has never smoked. He has never used smokeless tobacco. He reports that he drinks about 0.5 oz of alcohol per week . He reports that he does not use drugs.   Family History:   family history includes Cancer (age of onset: 79) in his father; Cirrhosis in his sister; Drug abuse in his sister; Heart attack in his father and mother.    Review of Systems: Review of Systems  Constitutional: Negative.   Respiratory: Negative.   Cardiovascular:  Negative.   Gastrointestinal: Negative.   Musculoskeletal: Negative.   Neurological: Negative.   Psychiatric/Behavioral: Negative.   All other systems reviewed and are negative.    PHYSICAL EXAM: VS:  BP 122/70 (BP Location: Left Arm, Patient Position: Sitting, Cuff Size: Normal)   Pulse 73   Ht 5\' 8"  (1.727 m)   Wt 203 lb 8 oz (92.3 kg)   BMI 30.94 kg/m  , BMI Body mass index is 30.94 kg/m. GEN: Well nourished, well developed, in no acute distress  HEENT: normal  Neck: no JVD, carotid bruits, or masses Cardiac: RRR; no murmurs, rubs, or gallops,no edema  Respiratory:  clear to auscultation bilaterally, normal work of breathing GI: soft, nontender, nondistended, + BS  MS: no deformity or atrophy  Skin: warm and dry, no rash Neuro:  Strength and sensation are intact Psych: euthymic mood, full affect    Recent Labs: 01/27/2015: ALT 37 01/30/2015: Magnesium 1.9 02/02/2015: BUN 17; Creatinine, Ser 0.78; Hemoglobin 11.0; Platelets 150; Potassium 3.5; Sodium 138    Lipid Panel Lab Results  Component Value Date   CHOL 197  He reports most recent number 150s 02/16/2014   HDL 34.10 (L) 02/16/2014   LDLCALC 61 04/11/2013   TRIG 402.0 (H) 02/16/2014      Wt Readings from Last 3 Encounters:  10/28/15 203 lb 8 oz (92.3 kg)  09/27/15 202 lb 1.6 oz (91.7 kg)  04/29/15 200 lb (90.7 kg)       ASSESSMENT AND PLAN:  Paroxysmal atrial fibrillation ([place]) - Plan: EKG 12-Lead Maintaining normal sinus rhythm We'll continue metoprolol  Coronary artery disease involving native coronary artery of native heart without angina pectoris Currently with no symptoms of angina. No further workup at this time. Continue current medication regimen.  Hyperlipidemia Cholesterol higher with poorly controlled diabetes Recommended weight loss, strict low carbohydrate diet We did discuss changing his medications, trying alternate statin, adding zetia. He prefers to into new his current  regimen  Essential hypertension Blood pressure is well controlled on today's visit. No changes made to the medications.  Uncontrolled type 2 diabetes mellitus with complication, with long-term current use of insulin (HCC) Discussed various diets Dietary guide provided to him, low carbohydrate  S/P CABG (coronary artery bypass graft) Currently with no anginal symptoms  No further testing   Total encounter time more than 25 minutes  Greater than 50% was spent in counseling and coordination of care with the patient   Disposition:   F/U  6 months   Orders Placed This Encounter  Procedures  . EKG 12-Lead     Signed, Esmond Plants, M.D., Ph.D. 10/28/2015  [place], [place]

## 2015-11-25 ENCOUNTER — Telehealth: Payer: Self-pay | Admitting: Cardiovascular Disease

## 2015-11-25 ENCOUNTER — Other Ambulatory Visit: Payer: Self-pay | Admitting: *Deleted

## 2015-11-25 MED ORDER — CLOPIDOGREL BISULFATE 75 MG PO TABS: 75.0000 mg | ORAL_TABLET | Freq: Every day | ORAL | 3 refills | Status: DC

## 2015-11-25 NOTE — Telephone Encounter (Signed)
Requested Prescriptions   Signed Prescriptions Disp Refills  . clopidogrel (PLAVIX) 75 MG tablet 90 tablet 3    Sig: Take 1 tablet (75 mg total) by mouth daily.    Authorizing Provider: GOLLAN, TIMOTHY J    Ordering User: LOPEZ, MARINA C    

## 2015-11-25 NOTE — Telephone Encounter (Signed)
Pt wife calling    *STAT* If patient is at the pharmacy, call can be transferred to refill team.   1. Which medications need to be refilled? (please list name of each medication and dose if known)  Clopidogrel   2. Which pharmacy/location (including street and city if local pharmacy) is medication to be sent to? [facility]   3. Do they need a 30 day or 90 day supply?  90 day

## 2016-02-28 ENCOUNTER — Other Ambulatory Visit: Payer: Self-pay | Admitting: Nurse Practitioner

## 2016-03-21 ENCOUNTER — Other Ambulatory Visit: Payer: Self-pay | Admitting: Cardiovascular Disease

## 2016-03-28 ENCOUNTER — Other Ambulatory Visit: Payer: Self-pay | Admitting: Cardiovascular Disease

## 2016-03-28 MED ORDER — SIMVASTATIN 40 MG PO TABS: 40.0000 mg | ORAL_TABLET | Freq: Every day | ORAL | 0 refills | Status: DC

## 2016-04-27 ENCOUNTER — Ambulatory Visit: Payer: BLUE CROSS/BLUE SHIELD | Admitting: Cardiovascular Disease

## 2016-05-03 ENCOUNTER — Telehealth: Payer: Self-pay | Admitting: Cardiovascular Disease

## 2016-05-03 NOTE — Telephone Encounter (Signed)
Hard to say what caused his symptoms Would certainly take nitroglycerin for any further chest pain symptoms We can arrange stress testing when he is available if there is concern for angina

## 2016-05-03 NOTE — Telephone Encounter (Signed)
S/w pt wife, Langley Gauss, (on [place]) who reports pt experienced several minutes of chest pain last night.  He did not tell her exactly how long it lasted and she is unsure of any other sx. States he told her chest pain is worsening.  Pt has hx of unstable angina, multiple cardiac catheterizations and 8 stents.  He is scheduled to fly out tomorrow for [place] and will return on Friday. Wife is unsure if pt should proceed as planned.  I offered to contact pt at work but wife states he will not answer the phone from an unknown number. Dr. Rockey Situ is not in the office today. Pt was scheduled to see Ignacia Bayley April 27 but has appt w/Dr. Rockey Situ April 11. Based on this information, I have advised pt's wife to ask pt to proceed to ED for further evaluation. She will call him at his office at this time.

## 2016-05-03 NOTE — Telephone Encounter (Signed)
I called pt on cell number. He reports "out of the ordinary chest pain" last evening  Rates 5-6/10 lasting 10-15 minutes. He experienced tingling in left arm but denies any other symptoms. He did not take nitro; pain resolved on its own. Yesterday morning he walked 4 miles on the treadmill with no symptoms. He is scheduled to fly out tomorrow. Wife is unsure if he should fly given the chest pain episode. D/t hx of unstable angina, multiple cardiac catheterizations, 8 stents and chest pain last evening, I advised pt to be evaluated in an ER setting prior to flying.  He verbalized understanding and states that at this moment, he feels "well" and if chest pain returns, he will proceed to ER.  Confirmed April 11 appt w/Dr. Rockey Situ. Will route to MD to make aware.

## 2016-05-03 NOTE — Telephone Encounter (Signed)
Pt c/o of Chest Pain: STAT if CP now or developed within 24 hours Had severe CP last night after dinner. It eventually went away.   1. Are you having CP right now? no  2. Are you experiencing any other symptoms (ex. SOB, nausea, vomiting, sweating)? no  3. How long have you been experiencing CP? Has had a little pain, sometimes feels tight, but last night was really bad.   4. Is your CP continuous or coming and going? continual  5. Have you taken Nitroglycerin? No, she doesn't think so.  ? Pt wife states pt is supposed to leave out in the morning on a flight to [place] I did schedule pt today to see Murray Hodgkins, NP at 2:00

## 2016-05-04 ENCOUNTER — Ambulatory Visit: Payer: BLUE CROSS/BLUE SHIELD | Admitting: Nurse Practitioner

## 2016-05-04 NOTE — Telephone Encounter (Signed)
Left message on machine for patient to contact the office.   

## 2016-05-09 NOTE — Progress Notes (Signed)
Cardiology Office Note  Date:  05/10/2016   ID:  Juan [name], DOB 07-24-1955, MRN [id number]  PCP:  Osborne Casco, MD   Chief Complaint  Patient presents with  . other    6 month f/u c/o chest pain. Meds reviewed verbally with pt.    HPI:  [name] is a very pleasant 61 year old male with a history of   coronary artery disease,  multiple cardiac catheterizations with intervention dating back to 1994,  total of 8 stents,  diabetes  hyperlipidemia,  Paroxysmal atrial fibrillation, post op CABG  presented to [facility] with angina, Cardiac catheterization showing three-vessel disease, referred to [facility] for CABG. 01/29/15 He presents today for follow-up of his coronary artery disease, CABG  hemoglobin A1c Was previouslylow 6 range Though more recently has been running high 7 range even up to low 8 Range  Reports having recent episode of chest pain He was Laying in bed 10 pm, had chest pain He went to the Bathroom, then Back to bed, symptoms resolved without intervention No further episodes since that time and he has been exercising without difficulty  He is worried that he was exercising to a greater degree of intensity back in December Did not exercise for 3 months and is now trying to return to his previous exercise level Does not feel a strong, not able to go as quickly or as fast Feels that there might be something wrong after taking 3 months off  No smoking history. Reports his father also had coronary artery disease in his 86s.  EKG personally reviewed by myself on todays visit shows normal sinus rhythm with rate 82 bpm, T-wave abnormality V5, V6, 1 and aVL, consider old inferior MI  Other past medical history reviewed Coronary artery bypass grafting x4 (left internal mammary artery to LAD, saphenous vein graft to posterior descending, saphenous vein graft to OM1, saphenous vein graft to OM2) withEndoscopic harvest of bilateral leg greater saphenous vein  by Dr. Prescott Gum on 01/29/2015.  cardiac catheterization 01/21/2015 showed  Prox RCA to Mid RCA lesion, 70% stenosed. The lesion was previously treated with a stent (unknown type), Prox RCA lesion, 70% stenosed.  Mid RCA lesion, 90% stenosed.  Mid Cx lesion, 90% stenosed, Ost Cx lesion, 90% stenosed.Ost 1st Mrg to 1st Mrg lesion, 90% stenosed.  Mid LAD lesion, 80% stenosed, 1st Diag lesion, 70% stenosed.  1. Severe diffuse calcified three-vessel coronary artery disease with previous multivessel stenting. 2. Normal LV systolic function and mildly elevated left ventricular end-diastolic pressure.  postoperatively he had brief period of atrial tachycardia/atrial fibrillation, started on amiodarone, Converted to normal sinus rhythm.  PMH:   has a past medical history of Asthma; Chronic kidney disease; Coronary artery disease; Essential hypertension; Heart murmur; Hyperlipidemia; Hypothyroidism; Mild sleep apnea; Myocardial infarction (1996); and Type II diabetes mellitus ([place]).  PSH:    Past Surgical History:  Procedure Laterality Date  . [place]; Dr.Harold; [facility].  . [place]; Dr. Joneen Boers; [facility]  . CARDIAC CATHETERIZATION  1996   ""  . CARDIAC CATHETERIZATION     ''''  . CARDIAC CATHETERIZATION     '''  . CARDIAC CATHETERIZATION     '''  . CARDIAC CATHETERIZATION  2002   '''  . CARDIAC CATHETERIZATION  2006   [facility]; [place].   Marland Kitchen CARDIAC CATHETERIZATION N/A 01/21/2015   Procedure: Left Heart Cath and Coronary Angiography;  Surgeon: Wellington Hampshire, MD;  Location: [place] [facility];  Service: Cardiovascular;  Laterality: N/A;  . cardiac stent     x 10  . CORONARY ARTERY BYPASS GRAFT N/A 01/29/2015   Procedure: CORONARY ARTERY BYPASS GRAFTING (CABG)x four using left internal mammary artery artery and bilateral saphenous thigh vein using  endoscope.;  Surgeon: Ivin Poot, MD;  Location: [place];  Service: Open Heart Surgery;  Laterality: N/A;  . CYST EXCISION     right- wrist- ganglion   . NASAL SEPTUM SURGERY    . TEE WITHOUT CARDIOVERSION N/A 01/29/2015   Procedure: TRANSESOPHAGEAL ECHOCARDIOGRAM (TEE);  Surgeon: Ivin Poot, MD;  Location: [place];  Service: Open Heart Surgery;  Laterality: N/A;  . THYROIDECTOMY      Current Outpatient Prescriptions  Medication Sig Dispense Refill  . aspirin 81 MG tablet Take 81 mg by mouth daily.    Jolyne Loa Grape-Goldenseal (BERBERINE COMPLEX PO) Take 500 mg by mouth 2 (two) times daily.    . clopidogrel (PLAVIX) 75 MG tablet Take 1 tablet (75 mg total) by mouth daily. 90 tablet 3  . Insulin Glargine (LANTUS) 100 UNIT/ML Solostar Pen Inject 24 Units into the skin daily at 10 pm. (Patient taking differently: Inject 40 Units into the skin daily at 10 pm. ) 15 mL 11  . levothyroxine (SYNTHROID) 100 MCG tablet Take 1 tablet (100 mcg total) by mouth daily before breakfast. 90 tablet 0  . metFORMIN (GLUCOPHAGE-XR) 500 MG 24 hr tablet Take 1 tablet (500 mg total) by mouth 2 (two) times daily with a meal. 60 tablet 1  . metoprolol (LOPRESSOR) 50 MG tablet TAKE 1 TABLET BY MOUTH TWICE DAILY 180 tablet 3  . Multiple Vitamin (MULTIVITAMIN) tablet Take 1 tablet by mouth 2 (two) times daily.     . nitroGLYCERIN (NITROSTAT) 0.4 MG SL tablet Place 0.4 mg under the tongue every 5 (five) minutes as needed for chest pain.    . Omega-3 Fatty Acids (FISH OIL PO) Take 1,800 mg by mouth daily.     . ramipril (ALTACE) 5 MG capsule Take 1 capsule (5 mg total) by mouth daily. 90 capsule 3  . simvastatin (ZOCOR) 40 MG tablet Take 1 tablet (40 mg total) by mouth daily. 90 tablet 0   No current facility-administered medications for this visit.      Allergies:   Patient has no known allergies.   Social History:  The patient  reports that he has never smoked. He has never used smokeless tobacco. He  reports that he drinks about 0.5 oz of alcohol per week . He reports that he does not use drugs.   Family History:   family history includes Cancer (age of onset: 17) in his father; Cirrhosis in his sister; Drug abuse in his sister; Heart attack in his father and mother.    Review of Systems: Review of Systems  Constitutional: Negative.   Respiratory: Positive for shortness of breath.   Cardiovascular: Positive for chest pain.  Gastrointestinal: Negative.   Musculoskeletal: Negative.   Neurological: Negative.   Psychiatric/Behavioral: Negative.   All other systems reviewed and are negative.    PHYSICAL EXAM: VS:  BP 120/70 (BP Location: Left Arm, Patient Position: Sitting, Cuff Size: Normal)   Pulse 82   Ht 5\' 8"  (1.727 m)   Wt 208 lb 4 oz (94.5 kg)   BMI 31.66 kg/m  , BMI Body mass index is 31.66 kg/m. GEN: Well nourished, well developed, in no acute distress  HEENT: normal  Neck: no JVD, carotid bruits, or masses Cardiac: RRR; no murmurs, rubs, or gallops,no edema  Respiratory:  clear to auscultation bilaterally, normal work of breathing GI: soft, nontender, nondistended, + BS MS: no deformity or atrophy  Skin: warm and dry, no rash Neuro:  Strength and sensation are intact Psych: euthymic mood, full affect    Recent Labs: No results found for requested labs within last 8760 hours.    Lipid Panel Lab Results  Component Value Date   CHOL 197 02/16/2014   HDL 34.10 (L) 02/16/2014   LDLCALC 61 04/11/2013   TRIG 402.0 (H) 02/16/2014      Wt Readings from Last 3 Encounters:  05/10/16 208 lb 4 oz (94.5 kg)  10/28/15 203 lb 8 oz (92.3 kg)  09/27/15 202 lb 1.6 oz (91.7 kg)       ASSESSMENT AND PLAN:  Coronary artery disease of native artery of native heart with stable angina pectoris (HCC) One episode of chest pain that relieved on its own without nitroglycerin several weeks ago, possibly months ago. Has been exercising since that time without any significant  chest discomfort Slight decrease in exercise tolerance after taking 3 months off, suspect likely from deconditioning. Reports able to treadmill for up to 1 hour without any chest pain He is concerned about decrease in exercise tolerance, one episode of chest pain. We did offer stress testing and recommended if he has additional episodes of chest pain that he schedule a stress test. For now will keep it as a standing order and recommended he continue exercising  Mixed hyperlipidemia Adequate cholesterol level,  Who did suggest we may want to be more aggressive Potentially could add zetia  Essential hypertension Blood pressure is well controlled on today's visit. No changes made to the medications.  Paroxysmal atrial fibrillation (HCC)  postoperatively he had brief period of atrial tachycardia/atrial fibrillation, started on amiodarone,  Converted to normal sinus rhythm. Maintaining NSR  S/P coronary artery stent placement  Uncontrolled type 2 diabetes mellitus with complication, with long-term current use of insulin ([place]) We have encouraged continued exercise, careful diet management in an effort to lose weight.  S/P CABG (coronary artery bypass graft) Long discussion concerning previous anatomy, bypass graft, symptoms of angina to watch for Stress test ordered but he will not schedule a test unless he has additional episodes of chest pain   Total encounter time more than 25 minutes  Greater than 50% was spent in counseling and coordination of care with the patient   Disposition:   F/U  6 months   Orders Placed This Encounter  Procedures  . EKG 12-Lead     Signed, Esmond Plants, M.D., Ph.D. 05/10/2016  [place], [place]

## 2016-05-10 ENCOUNTER — Ambulatory Visit (INDEPENDENT_AMBULATORY_CARE_PROVIDER_SITE_OTHER): Payer: BLUE CROSS/BLUE SHIELD | Admitting: Cardiovascular Disease

## 2016-05-10 ENCOUNTER — Encounter: Payer: Self-pay | Admitting: Cardiovascular Disease

## 2016-05-10 VITALS — BP 120/70 | HR 82 | Ht 68.0 in | Wt 208.2 lb

## 2016-05-10 DIAGNOSIS — I25118 Atherosclerotic heart disease of native coronary artery with other forms of angina pectoris: Secondary | ICD-10-CM

## 2016-05-10 DIAGNOSIS — E118 Type 2 diabetes mellitus with unspecified complications: Secondary | ICD-10-CM | POA: Diagnosis not present

## 2016-05-10 DIAGNOSIS — Z794 Long term (current) use of insulin: Secondary | ICD-10-CM | POA: Diagnosis not present

## 2016-05-10 DIAGNOSIS — E782 Mixed hyperlipidemia: Secondary | ICD-10-CM | POA: Diagnosis not present

## 2016-05-10 DIAGNOSIS — E1165 Type 2 diabetes mellitus with hyperglycemia: Secondary | ICD-10-CM

## 2016-05-10 DIAGNOSIS — I10 Essential (primary) hypertension: Secondary | ICD-10-CM

## 2016-05-10 DIAGNOSIS — Z951 Presence of aortocoronary bypass graft: Secondary | ICD-10-CM | POA: Diagnosis not present

## 2016-05-10 DIAGNOSIS — I480 Paroxysmal atrial fibrillation: Secondary | ICD-10-CM | POA: Diagnosis not present

## 2016-05-10 DIAGNOSIS — Z955 Presence of coronary angioplasty implant and graft: Secondary | ICD-10-CM | POA: Diagnosis not present

## 2016-05-10 DIAGNOSIS — IMO0002 Reserved for concepts with insufficient information to code with codable children: Secondary | ICD-10-CM

## 2016-05-10 NOTE — Patient Instructions (Addendum)
Medication Instructions:   No medication changes made  Labwork:  No new labs needed  Testing/Procedures:   We will place an order for treadmill myoview for chest pain Schedule at a later date  Hold caffeine 24 hours before the test Hold metoprolol in the PM and Am before the test No food the morning of the test   I recommend watching educational videos on topics of interest to you at:       [email or web address].com  Enter code: [email or web address]    Follow-Up: It was a pleasure seeing you in the office today. Please call us if you have new issues that need to be addressed before your next appt.  [phone]  Your physician wants you to follow-up in: 6 months.  You will receive a reminder letter in the mail two months in advance. If you don't receive a letter, please call our office to schedule the follow-up appointment.  If you need a refill on your cardiac medications before your next appointment, please call your pharmacy.

## 2016-05-26 ENCOUNTER — Ambulatory Visit: Payer: BLUE CROSS/BLUE SHIELD | Admitting: Nurse Practitioner

## 2016-06-30 ENCOUNTER — Encounter (INDEPENDENT_AMBULATORY_CARE_PROVIDER_SITE_OTHER): Payer: BLUE CROSS/BLUE SHIELD | Admitting: Ophthalmology

## 2016-06-30 DIAGNOSIS — E113211 Type 2 diabetes mellitus with mild nonproliferative diabetic retinopathy with macular edema, right eye: Secondary | ICD-10-CM | POA: Diagnosis not present

## 2016-06-30 DIAGNOSIS — H43813 Vitreous degeneration, bilateral: Secondary | ICD-10-CM

## 2016-06-30 DIAGNOSIS — I10 Essential (primary) hypertension: Secondary | ICD-10-CM | POA: Diagnosis not present

## 2016-06-30 DIAGNOSIS — H35033 Hypertensive retinopathy, bilateral: Secondary | ICD-10-CM | POA: Diagnosis not present

## 2016-06-30 DIAGNOSIS — E10311 Type 1 diabetes mellitus with unspecified diabetic retinopathy with macular edema: Secondary | ICD-10-CM

## 2016-06-30 DIAGNOSIS — E113292 Type 2 diabetes mellitus with mild nonproliferative diabetic retinopathy without macular edema, left eye: Secondary | ICD-10-CM | POA: Diagnosis not present

## 2016-07-09 ENCOUNTER — Other Ambulatory Visit: Payer: Self-pay | Admitting: Cardiovascular Disease

## 2016-08-14 ENCOUNTER — Other Ambulatory Visit: Payer: Self-pay | Admitting: Cardiovascular Disease

## 2016-10-29 NOTE — Progress Notes (Signed)
Cardiology Office Note  Date:  10/31/2016   ID:  Juan [name], DOB 07/16/1955, MRN [id number]  PCP:  Juan Pillar, MD   Chief Complaint  Patient presents with  . other    6 month f/u c/o chest pain with exertion. Meds reviewed verbally with pt.    HPI:  Juan [name] is a very pleasant 61 year old male with a history of   coronary artery disease,  multiple cardiac catheterizations with intervention dating back to 1994,  total of 8 stents,  diabetes  hyperlipidemia,  Paroxysmal atrial fibrillation, post op CABG  presented to [facility] with angina, Cardiac catheterization showing three-vessel disease, referred to [facility] for CABG. 01/29/15 He presents today for follow-up of his coronary artery disease, CABG  Diabetes numbers have been running high now up 8.7 Using larger amounts of insulin  Reports having more chest pain since his last clinic visit Seems to happen with lower amounts of exercise Reports it is a definite change since after his bypass surgery  Other stressors including wedding coming up in 2 or 3 weeks, daughter Has not been taking nitroglycerin Previously was exercising for long periods of time, now has to cut back on his exercise dramatically  Ab work reviewed with him in detail Hemoglobin 8.7, total cholesterol 165 which is up from 133 last year  EKG personally reviewed by myself on todays visit Shows normal sinus rhythm rate 92 bpm T-wave abnormality anterolateral leads, seen on previous EKGs  Other past medical history reviewed No smoking history. Reports his father also had coronary artery disease in his 65s.  Coronary artery bypass grafting x4 (left internal mammary artery to LAD, saphenous vein graft to posterior descending, saphenous vein graft to OM1, saphenous vein graft to OM2) withEndoscopic harvest of bilateral leg greater saphenous vein by Juan [name] on 01/29/2015.  cardiac catheterization 01/21/2015 showed  Prox RCA to Mid  RCA lesion, 70% stenosed. The lesion was previously treated with a stent (unknown type), Prox RCA lesion, 70% stenosed.  Mid RCA lesion, 90% stenosed.  Mid Cx lesion, 90% stenosed, Ost Cx lesion, 90% stenosed.Ost 1st Mrg to 1st Mrg lesion, 90% stenosed.  Mid LAD lesion, 80% stenosed, 1st Diag lesion, 70% stenosed.  1. Severe diffuse calcified three-vessel coronary artery disease with previous multivessel stenting. 2. Normal LV systolic function and mildly elevated left ventricular end-diastolic pressure.  postoperatively he had brief period of atrial tachycardia/atrial fibrillation, started on amiodarone, Converted to normal sinus rhythm.  PMH:   has a past medical history of Asthma; Chronic kidney disease; Coronary artery disease; Essential hypertension; Heart murmur; Hyperlipidemia; Hypothyroidism; Mild sleep apnea; Myocardial infarction (Juan [name]) (1996); and Type II diabetes mellitus (Juan [name]).  PSH:    Past Surgical History:  Procedure Laterality Date  . [place]; JuanHarold; [facility].  . [place]; Juan [name]; [facility]  . CARDIAC CATHETERIZATION  1996   ""  . CARDIAC CATHETERIZATION     ''''  . CARDIAC CATHETERIZATION     '''  . CARDIAC CATHETERIZATION     '''  . CARDIAC CATHETERIZATION  2002   '''  . CARDIAC CATHETERIZATION  2006   [facility]; [place]   Marland Kitchen CARDIAC CATHETERIZATION N/A 01/21/2015   Procedure: Left Heart Cath and Coronary Angiography;  Surgeon: Juan Hampshire, MD;  Location: [facility];  Service: Cardiovascular;  Laterality: N/A;  . cardiac stent     x 10  . CORONARY ARTERY BYPASS  GRAFT N/A 01/29/2015   Procedure: CORONARY ARTERY BYPASS GRAFTING (CABG)x four using left internal mammary artery artery and bilateral saphenous thigh vein using endoscope.;  Surgeon: Juan Poot, MD;  Location: [facility];  Service: Open Heart Surgery;  Laterality:  N/A;  . CYST EXCISION     right- wrist- ganglion   . NASAL SEPTUM SURGERY    . TEE WITHOUT CARDIOVERSION N/A 01/29/2015   Procedure: TRANSESOPHAGEAL ECHOCARDIOGRAM (TEE);  Surgeon: Juan Poot, MD;  Location: [facility];  Service: Open Heart Surgery;  Laterality: N/A;  . THYROIDECTOMY      Current Outpatient Prescriptions  Medication Sig Dispense Refill  . aspirin 81 MG tablet Take 81 mg by mouth daily.    . clopidogrel (PLAVIX) 75 MG tablet Take 1 tablet (75 mg total) by mouth daily. 90 tablet 3  . Coenzyme Q10 (CO Q 10 PO) Take 100 mg by mouth daily.    . Insulin Glargine (LANTUS) 100 UNIT/ML Solostar Pen Inject 24 Units into the skin daily at 10 pm. (Patient taking differently: Inject 52 Units into the skin daily at 10 pm. ) 15 mL 11  . levothyroxine (SYNTHROID) 100 MCG tablet Take 1 tablet (100 mcg total) by mouth daily before breakfast. 90 tablet 0  . metFORMIN (GLUCOPHAGE-XR) 500 MG 24 hr tablet Take 1 tablet (500 mg total) by mouth 2 (two) times daily with a meal. 60 tablet 1  . metoprolol (LOPRESSOR) 50 MG tablet TAKE 1 TABLET BY MOUTH TWICE DAILY 180 tablet 3  . Multiple Vitamin (MULTIVITAMIN) tablet Take 1 tablet by mouth 2 (two) times daily.     . nitroGLYCERIN (NITROSTAT) 0.4 MG SL tablet Place 0.4 mg under the tongue every 5 (five) minutes as needed for chest pain.    . Omega-3 Fatty Acids (FISH OIL PO) Take 900 mg by mouth daily.     . ramipril (ALTACE) 5 MG capsule TAKE 1 CAPSULE BY MOUTH DAILY 90 capsule 0  . simvastatin (ZOCOR) 40 MG tablet TAKE 1 TABLET BY MOUTH DAILY 90 tablet 3   No current facility-administered medications for this visit.      Allergies:   Patient has no known allergies.   Social History:  The patient  reports that he has never smoked. He has never used smokeless tobacco. He reports that he drinks about 0.5 oz of alcohol per week . He reports that he does not use drugs.   Family History:   family history includes Cancer (age of onset: 22) in his  father; Cirrhosis in his sister; Drug abuse in his sister; Heart attack in his father and mother.    Review of Systems: Review of Systems  Constitutional: Negative.   Respiratory: Negative.   Cardiovascular: Positive for chest pain.  Gastrointestinal: Negative.   Musculoskeletal: Negative.   Neurological: Negative.   Psychiatric/Behavioral: Negative.   All other systems reviewed and are negative.    PHYSICAL EXAM: VS:  BP 132/80 (BP Location: Left Arm, Patient Position: Sitting, Cuff Size: Normal)   Pulse 92   Ht 5\' 8"  (1.727 m)   Wt 207 lb (93.9 kg)   BMI 31.47 kg/m  , BMI Body mass index is 31.47 kg/m.  GEN: Well nourished, well developed, in no acute distress  HEENT: normal  Neck: no JVD, carotid bruits, or masses Cardiac: RRR; no murmurs, rubs, or gallops,no edema  Respiratory:  clear to auscultation bilaterally, normal work of breathing GI: soft, nontender, nondistended, + BS MS: no deformity or atrophy  Skin:  warm and dry, no rash Neuro:  Strength and sensation are intact Psych: euthymic mood, full affect    Recent Labs: No results found for requested labs within last 8760 hours.    Lipid Panel Lab Results  Component Value Date   CHOL 197 02/16/2014   HDL 34.10 (L) 02/16/2014   LDLCALC 61 04/11/2013   TRIG 402.0 (H) 02/16/2014      Wt Readings from Last 3 Encounters:  10/31/16 207 lb (93.9 kg)  05/10/16 208 lb 4 oz (94.5 kg)  10/28/15 203 lb 8 oz (92.3 kg)       ASSESSMENT AND PLAN:  Coronary artery disease of native artery of native heart with stable angina pectoris ([place]) On last visit had one episode of chest pain resolved on its own Now reports having more episodes of chest pain at lower levels of exercise Feels it is a definite progression over the past several months Did not have chest pain following bypass surgery Long time spent discussing various treatment options including stress testing versus catheterization After discussing risk  and benefit invasive, noninvasive, we will schedule perfusion Myoview  scan at this time. May need cardiac catheterization at a later date if symptoms progress to unstable angina or for positive stress test  Mixed hyperlipidemia Cholesterol is well above goal likely from poor control diabetes Cholesterol last year 133 now 165 Suggested he consider meeting with endocrinology given his insulin requirements have gone up dramatically  Essential hypertension reports blood pressure has been running higher, recommended he increase ramipril up to 10 mg daily, increase metoprolol up to 100 mg twice a day This will also help with anginal symptoms  Paroxysmal atrial fibrillation ([place])  postoperatively he had brief period of atrial tachycardia/atrial fibrillation, started on amiodarone,  Maintaining NSR  S/P coronary artery stent placement Having stable angina symptoms, increasing over the past several months as above Order placed for stress testing  Uncontrolled type 2 diabetes mellitus with complication, with long-term current use of insulin ([place]) We have encouraged careful diet management in an effort to lose weight.  S/P CABG (coronary artery bypass graft) STress test has been ordered as above to rule out ischemia   Total encounter time more than 45 minutes  Greater than 50% was spent in counseling and coordination of care with the patient   Disposition:   F/U  3 months   Orders Placed This Encounter  Procedures  . EKG 12-Lead     Signed, Esmond Plants, M.D., Ph.D. 10/31/2016  [place], [place]

## 2016-10-30 ENCOUNTER — Ambulatory Visit (INDEPENDENT_AMBULATORY_CARE_PROVIDER_SITE_OTHER): Payer: BLUE CROSS/BLUE SHIELD | Admitting: Ophthalmology

## 2016-10-30 DIAGNOSIS — E11311 Type 2 diabetes mellitus with unspecified diabetic retinopathy with macular edema: Secondary | ICD-10-CM | POA: Diagnosis not present

## 2016-10-30 DIAGNOSIS — H35341 Macular cyst, hole, or pseudohole, right eye: Secondary | ICD-10-CM | POA: Diagnosis not present

## 2016-10-30 DIAGNOSIS — H35033 Hypertensive retinopathy, bilateral: Secondary | ICD-10-CM

## 2016-10-30 DIAGNOSIS — H43813 Vitreous degeneration, bilateral: Secondary | ICD-10-CM

## 2016-10-30 DIAGNOSIS — H2513 Age-related nuclear cataract, bilateral: Secondary | ICD-10-CM

## 2016-10-30 DIAGNOSIS — E113392 Type 2 diabetes mellitus with moderate nonproliferative diabetic retinopathy without macular edema, left eye: Secondary | ICD-10-CM | POA: Diagnosis not present

## 2016-10-30 DIAGNOSIS — I10 Essential (primary) hypertension: Secondary | ICD-10-CM | POA: Diagnosis not present

## 2016-10-30 DIAGNOSIS — E113211 Type 2 diabetes mellitus with mild nonproliferative diabetic retinopathy with macular edema, right eye: Secondary | ICD-10-CM

## 2016-10-31 ENCOUNTER — Encounter: Payer: Self-pay | Admitting: Cardiovascular Disease

## 2016-10-31 ENCOUNTER — Ambulatory Visit (INDEPENDENT_AMBULATORY_CARE_PROVIDER_SITE_OTHER): Payer: BLUE CROSS/BLUE SHIELD | Admitting: Cardiovascular Disease

## 2016-10-31 VITALS — BP 132/80 | HR 92 | Ht 68.0 in | Wt 207.0 lb

## 2016-10-31 DIAGNOSIS — Z794 Long term (current) use of insulin: Secondary | ICD-10-CM | POA: Diagnosis not present

## 2016-10-31 DIAGNOSIS — I480 Paroxysmal atrial fibrillation: Secondary | ICD-10-CM | POA: Diagnosis not present

## 2016-10-31 DIAGNOSIS — E1159 Type 2 diabetes mellitus with other circulatory complications: Secondary | ICD-10-CM | POA: Diagnosis not present

## 2016-10-31 DIAGNOSIS — E1165 Type 2 diabetes mellitus with hyperglycemia: Secondary | ICD-10-CM

## 2016-10-31 DIAGNOSIS — E782 Mixed hyperlipidemia: Secondary | ICD-10-CM | POA: Diagnosis not present

## 2016-10-31 DIAGNOSIS — I25118 Atherosclerotic heart disease of native coronary artery with other forms of angina pectoris: Secondary | ICD-10-CM

## 2016-10-31 DIAGNOSIS — I10 Essential (primary) hypertension: Secondary | ICD-10-CM

## 2016-10-31 DIAGNOSIS — E118 Type 2 diabetes mellitus with unspecified complications: Secondary | ICD-10-CM | POA: Diagnosis not present

## 2016-10-31 DIAGNOSIS — IMO0002 Reserved for concepts with insufficient information to code with codable children: Secondary | ICD-10-CM

## 2016-10-31 DIAGNOSIS — Z951 Presence of aortocoronary bypass graft: Secondary | ICD-10-CM

## 2016-10-31 DIAGNOSIS — Z955 Presence of coronary angioplasty implant and graft: Secondary | ICD-10-CM

## 2016-10-31 DIAGNOSIS — R079 Chest pain, unspecified: Secondary | ICD-10-CM | POA: Diagnosis not present

## 2016-10-31 MED ORDER — METOPROLOL TARTRATE 100 MG PO TABS: 100.0000 mg | ORAL_TABLET | Freq: Two times a day (BID) | ORAL | 3 refills | Status: DC

## 2016-10-31 MED ORDER — RAMIPRIL 10 MG PO CAPS: 10.0000 mg | ORAL_CAPSULE | Freq: Every day | ORAL | 3 refills | Status: DC

## 2016-10-31 NOTE — Patient Instructions (Addendum)
Medication Instructions:   Please increase the metoprolol up to 100 mg twice a day Increase the ramipril up to 10 mg once a day  Labwork:  No new labs needed  Testing/Procedures:  We will order a treadmill myoview for chest pain, CAD, CABG  [facility] MYOVIEW  Your caregiver has ordered a Stress Test with nuclear imaging. The purpose of this test is to evaluate the blood supply to your heart muscle. This procedure is referred to as a "Non-Invasive Stress Test." This is because other than having an IV started in your vein, nothing is inserted or "invades" your body. Cardiac stress tests are done to find areas of poor blood flow to the heart by determining the extent of coronary artery disease (CAD). Some patients exercise on a treadmill, which naturally increases the blood flow to your heart, while others who are  unable to walk on a treadmill due to physical limitations have a pharmacologic/chemical stress agent called Lexiscan . This medicine will mimic walking on a treadmill by temporarily increasing your coronary blood flow.   Please note: these test may take anywhere between 2-4 hours to complete  PLEASE REPORT TO [place] WILL DIRECT YOU WHERE TO GO  Date of Procedure:_Friday October 5th__  Arrival Time for Procedure:___Arrive at 07:15AM_____  Instructions regarding medication:   _X___ : Hold diabetes medication morning of procedure  _X___:  Hold metoprolol the night before and the morning of procedure    PLEASE NOTIFY THE OFFICE AT LEAST 24 HOURS IN ADVANCE IF YOU ARE UNABLE TO KEEP YOUR APPOINTMENT.  [phone] AND  PLEASE NOTIFY NUCLEAR MEDICINE AT [facility] AT LEAST 24 HOURS IN ADVANCE IF YOU ARE UNABLE TO KEEP YOUR APPOINTMENT. [phone]  How to prepare for your Myoview test:  1. Do not eat or drink after midnight 2. No caffeine for 24 hours prior to test 3. No smoking 24 hours prior to test. 4. Your medication may be  taken with water.  If your doctor stopped a medication because of this test, do not take that medication. 5. Ladies, please do not wear dresses.  Skirts or pants are appropriate. Please wear a short sleeve shirt. 6. No perfume, cologne or lotion. 7. Wear comfortable walking shoes. No heels!   Follow-Up: It was a pleasure seeing you in the office today. Please call us if you have new issues that need to be addressed before your next appt.  [phone]  Your physician wants you to follow-up in: 3 months.  You will receive a reminder letter in the mail two months in advance. If you don't receive a letter, please call our office to schedule the follow-up appointment.  If you need a refill on your cardiac medications before your next appointment, please call your pharmacy.    Cardiac Nuclear Scan A cardiac nuclear scan is a test that measures blood flow to the heart when a person is resting and when he or she is exercising. The test looks for problems such as:  Not enough blood reaching a portion of the heart.  The heart muscle not working normally.  You may need this test if:  You have heart disease.  You have had abnormal lab results.  You have had heart surgery or angioplasty.  You have chest pain.  You have shortness of breath.  In this test, a radioactive dye (tracer) is injected into your bloodstream. After the tracer has traveled to your heart, an imaging device  is used to measure how much of the tracer is absorbed by or distributed to various areas of your heart. This procedure is usually done at a hospital and takes 2-4 hours. Tell a health care provider about:  Any allergies you have.  All medicines you are taking, including vitamins, herbs, eye drops, creams, and over-the-counter medicines.  Any problems you or family members have had with the use of anesthetic medicines.  Any blood disorders you have.  Any surgeries you have had.  Any medical conditions you  have.  Whether you are pregnant or may be pregnant. What are the risks? Generally, this is a safe procedure. However, problems may occur, including:  Serious chest pain and heart attack. This is only a risk if the stress portion of the test is done.  Rapid heartbeat.  Sensation of warmth in your chest. This usually passes quickly.  What happens before the procedure?  Ask your health care provider about changing or stopping your regular medicines. This is especially important if you are taking diabetes medicines or blood thinners.  Remove your jewelry on the day of the procedure. What happens during the procedure?  An IV tube will be inserted into one of your veins.  Your health care provider will inject a small amount of radioactive tracer through the tube.  You will wait for 20-40 minutes while the tracer travels through your bloodstream.  Your heart activity will be monitored with an electrocardiogram (ECG).  You will lie down on an exam table.  Images of your heart will be taken for about 15-20 minutes.  You may be asked to exercise on a treadmill or stationary bike. While you exercise, your heart's activity will be monitored with an ECG, and your blood pressure will be checked. If you are unable to exercise, you may be given a medicine to increase blood flow to parts of your heart.  When blood flow to your heart has peaked, a tracer will again be injected through the IV tube.  After 20-40 minutes, you will get back on the exam table and have more images taken of your heart.  When the procedure is over, your IV tube will be removed. The procedure may vary among health care providers and hospitals. Depending on the type of tracer used, scans may need to be repeated 3-4 hours later. What happens after the procedure?  Unless your health care provider tells you otherwise, you may return to your normal schedule, including diet, activities, and medicines.  Unless your health  care provider tells you otherwise, you may increase your fluid intake. This will help flush the contrast dye from your body. Drink enough fluid to keep your urine clear or pale yellow.  It is up to you to get your test results. Ask your health care provider, or the department that is doing the test, when your results will be ready. Summary  A cardiac nuclear scan measures the blood flow to the heart when a person is resting and when he or she is exercising.  You may need this test if you are at risk for heart disease.  Tell your health care provider if you are pregnant.  Unless your health care provider tells you otherwise, increase your fluid intake. This will help flush the contrast dye from your body. Drink enough fluid to keep your urine clear or pale yellow. This information is not intended to replace advice given to you by your health care provider. Make sure you discuss any questions  you have with your health care provider. Document Released: 02/11/2004 Document Revised: 01/19/2016 Document Reviewed: 12/25/2012 Elsevier Interactive Patient Education  2017 [employer].

## 2016-11-03 ENCOUNTER — Encounter
Admission: RE | Admit: 2016-11-03 | Discharge: 2016-11-03 | Disposition: A | Discharge status: Home / Self Care | Payer: BLUE CROSS/BLUE SHIELD | Source: Ambulatory Visit | Attending: Cardiovascular Disease | Admitting: Cardiovascular Disease

## 2016-11-03 DIAGNOSIS — IMO0002 Reserved for concepts with insufficient information to code with codable children: Secondary | ICD-10-CM

## 2016-11-03 DIAGNOSIS — I10 Essential (primary) hypertension: Secondary | ICD-10-CM

## 2016-11-03 DIAGNOSIS — Z955 Presence of coronary angioplasty implant and graft: Secondary | ICD-10-CM

## 2016-11-03 DIAGNOSIS — E1165 Type 2 diabetes mellitus with hyperglycemia: Secondary | ICD-10-CM | POA: Diagnosis present

## 2016-11-03 DIAGNOSIS — I480 Paroxysmal atrial fibrillation: Secondary | ICD-10-CM

## 2016-11-03 DIAGNOSIS — Z794 Long term (current) use of insulin: Secondary | ICD-10-CM

## 2016-11-03 DIAGNOSIS — R079 Chest pain, unspecified: Secondary | ICD-10-CM | POA: Diagnosis not present

## 2016-11-03 DIAGNOSIS — E782 Mixed hyperlipidemia: Secondary | ICD-10-CM | POA: Diagnosis not present

## 2016-11-03 DIAGNOSIS — E118 Type 2 diabetes mellitus with unspecified complications: Secondary | ICD-10-CM | POA: Insufficient documentation

## 2016-11-03 DIAGNOSIS — Z951 Presence of aortocoronary bypass graft: Secondary | ICD-10-CM

## 2016-11-03 DIAGNOSIS — I25118 Atherosclerotic heart disease of native coronary artery with other forms of angina pectoris: Secondary | ICD-10-CM | POA: Diagnosis not present

## 2016-11-03 DIAGNOSIS — E1159 Type 2 diabetes mellitus with other circulatory complications: Secondary | ICD-10-CM | POA: Diagnosis present

## 2016-11-03 LAB — NM MYOCAR MULTI W/SPECT W/WALL MOTION / EF
CHL CUP MPHR: 160 {beats}/min
CHL CUP NUCLEAR SRS: 3
CHL CUP NUCLEAR SSS: 9
CHL CUP RESTING HR STRESS: 94 {beats}/min
CHL CUP STRESS STAGE 1 GRADE: 0 %
CHL CUP STRESS STAGE 1 SPEED: 0 mph
CHL CUP STRESS STAGE 2 GRADE: 0.1 %
CHL CUP STRESS STAGE 2 HR: 103 {beats}/min
CHL CUP STRESS STAGE 3 GRADE: 10 %
CHL CUP STRESS STAGE 4 GRADE: 12 %
CHL CUP STRESS STAGE 4 HR: 131 {beats}/min
CHL CUP STRESS STAGE 4 SPEED: 2.5 mph
CHL CUP STRESS STAGE 5 GRADE: 14 %
CHL CUP STRESS STAGE 5 HR: 141 {beats}/min
CHL CUP STRESS STAGE 5 SPEED: 3.4 mph
CHL CUP STRESS STAGE 7 HR: 98 {beats}/min
CHL CUP STRESS STAGE 7 SPEED: 0 mph
CSEPED: 7 min
CSEPEDS: 45 s
CSEPEW: 9.7 METS
CSEPHR: 88 %
CSEPPHR: 141 {beats}/min
LV dias vol: 82 mL (ref 62–150)
LV sys vol: 34 mL
Percent of predicted max HR: 88 %
SDS: 6
Stage 1 HR: 103 {beats}/min
Stage 2 Speed: 0 mph
Stage 3 HR: 122 {beats}/min
Stage 3 Speed: 1.7 mph
Stage 4 DBP: 74 mmHg
Stage 4 SBP: 152 mmHg
Stage 6 Grade: 0 %
Stage 6 HR: 116 {beats}/min
Stage 6 Speed: 0 mph
Stage 7 DBP: 81 mmHg
Stage 7 Grade: 0 %
Stage 7 SBP: 172 mmHg
TID: 0.78

## 2016-11-03 MED ORDER — TECHNETIUM TC 99M TETROFOSMIN IV KIT
13.0000 | PACK | Freq: Once | INTRAVENOUS | Status: AC | PRN
  Administered 2016-11-03: 13.06 via INTRAVENOUS

## 2016-11-03 MED ORDER — TECHNETIUM TC 99M TETROFOSMIN IV KIT
32.5300 | PACK | Freq: Once | INTRAVENOUS | Status: AC | PRN
  Administered 2016-11-03: 32.53 via INTRAVENOUS

## 2016-11-05 ENCOUNTER — Other Ambulatory Visit: Payer: Self-pay | Admitting: Cardiovascular Disease

## 2016-11-06 ENCOUNTER — Other Ambulatory Visit: Payer: Self-pay

## 2016-11-06 MED ORDER — RAMIPRIL 10 MG PO CAPS: 10.0000 mg | ORAL_CAPSULE | Freq: Every day | ORAL | 3 refills | Status: DC

## 2016-11-07 ENCOUNTER — Telehealth: Payer: Self-pay | Admitting: Cardiovascular Disease

## 2016-11-07 DIAGNOSIS — Z01810 Encounter for preprocedural cardiovascular examination: Secondary | ICD-10-CM

## 2016-11-07 DIAGNOSIS — R079 Chest pain, unspecified: Secondary | ICD-10-CM

## 2016-11-07 NOTE — Telephone Encounter (Signed)
Patient would like results from nm study  Please call cell or work:  [phone]

## 2016-11-07 NOTE — Telephone Encounter (Signed)
I can do on Monday, October 22 as long as his symptoms remain stable. He can do regular activities but should not overexert himself.

## 2016-11-07 NOTE — Telephone Encounter (Signed)
S/w patient. He verbalized understanding of Myoview stress test results: "Notes recorded by Minna Merritts, MD on 11/04/2016 at 11:09 AM EDT Stress test Suggests blockage, possibly one of the graft There is ischemia in anterolateral wall Would recommend cardiac cath Would let us know if he would like to proceed? If yes, what is best timing"  Patient is concerned about how serious this is. Patient's daughter is getting married on 11/18/16. Patient wants to know is it serious enough to have the heart cath prior to the wedding in 11 days. Dr Fletcher Anon did patient's last heart cath 01/21/15.  Patient would like Dr Fletcher Anon to perform cath again. Will route to Dr Fletcher Anon for advice on timing for patient's cath if he needs before or after upcoming wedding.

## 2016-11-08 ENCOUNTER — Encounter: Payer: Self-pay | Admitting: *Deleted

## 2016-11-08 NOTE — Telephone Encounter (Signed)
Ok thanks 

## 2016-11-08 NOTE — Telephone Encounter (Signed)
Discussed possible times for cath with patient. Patient unable to do on 11/20/16. Patient available on 10/26. Cath scheduled for 11/24/16 at 0730 with Dr Fletcher Anon. Patient request Dr. Fletcher Anon as he did patient's previous cath. MyChart Message with the following instructions sent per patient's request.   [facility] Cardiac Cath Instructions   You are scheduled for a Cardiac Cath on:___10/26/18_______  Please arrive at _06:30___am on the day of your procedure  Please expect a call from our [place] [facility] to pre-register you  Do not eat/drink anything after midnight  Someone will need to drive you home  It is recommended someone be with you for the first 24 hours after your procedure  Wear clothes that are easy to get on/off and wear slip on shoes if possible   Medications bring a current list of all medications with you  _X_ You may take all of your medications the morning of your procedure with enough water to swallow safely EXCEPT FOR THE ONES LISTED BELOW.  _X_ Do not take these medications before your procedure:  Metformin the day before, morning of, or for 48 hours after the procedure. Lantus - do not take the morning of procedure(patient states he takes in the morning.   Labwork and Chest X-ray: [facility] to [facility] sometime between 10/17 and 10/22. - Please go to the [facility]. You will check in at the front desk to the right as you walk into the atrium. Valet Parking is offered if needed. - No appointment needed.   Day of your procedure: Arrive at the [facility] entrance.  Free valet service is available.  After entering the [facility] please check-in at the registration desk (1st desk on your right) to receive your armband. After receiving your armband someone will escort you to the cardiac cath/special procedures waiting area.  The usual length of stay after your procedure is about 2 to 3 hours.  This can vary.  If you have any questions, please  call our office at [phone], or you may call the cardiac cath lab at [facility] directly at [phone]

## 2016-11-10 ENCOUNTER — Telehealth: Payer: Self-pay | Admitting: Cardiovascular Disease

## 2016-11-10 MED ORDER — RAMIPRIL 10 MG PO CAPS: 10.0000 mg | ORAL_CAPSULE | Freq: Every day | ORAL | 3 refills | Status: DC

## 2016-11-10 MED ORDER — METOPROLOL TARTRATE 100 MG PO TABS: 100.0000 mg | ORAL_TABLET | Freq: Two times a day (BID) | ORAL | 3 refills | Status: DC

## 2016-11-10 NOTE — Telephone Encounter (Signed)
°*  STAT* If patient is at the pharmacy, call can be transferred to refill team.   1. Which medications need to be refilled? (please list name of each medication and dose if known)   Metoprolol 100 mg po BID  Ramipril 10 mg po q day     2. Which pharmacy/location (including street and city if local pharmacy) is medication to be sent to?  [facility]    3. Do they need a 30 day or 90 day supply? 90    PER SPOUSE PLEASE REMOVE [facility] FROM LIST THEY NO LONGER USE THAT PHARMACY PATIENT IS ALMOST OUT OF MEDS

## 2016-11-14 ENCOUNTER — Other Ambulatory Visit: Payer: Self-pay | Admitting: *Deleted

## 2016-11-14 MED ORDER — METOPROLOL TARTRATE 100 MG PO TABS: 100.0000 mg | ORAL_TABLET | Freq: Two times a day (BID) | ORAL | 1 refills | Status: DC

## 2016-11-14 MED ORDER — RAMIPRIL 10 MG PO CAPS: 10.0000 mg | ORAL_CAPSULE | Freq: Every day | ORAL | 1 refills | Status: DC

## 2016-11-14 NOTE — Telephone Encounter (Signed)
Requested Prescriptions   Signed Prescriptions Disp Refills  . metoprolol tartrate (LOPRESSOR) 100 MG tablet 60 tablet 1    Sig: Take 1 tablet (100 mg total) by mouth 2 (two) times daily.    Authorizing Provider: Minna Merritts    Ordering User: Eugenio Hoes, Heaven Wandell C  . ramipril (ALTACE) 10 MG capsule 30 capsule 1    Sig: Take 1 capsule (10 mg total) by mouth daily.    Authorizing Provider: Minna Merritts    Ordering User: Britt Bottom

## 2016-11-14 NOTE — Telephone Encounter (Signed)
Requested Prescriptions   Signed Prescriptions Disp Refills  . metoprolol tartrate (LOPRESSOR) 100 MG tablet 60 tablet 1    Sig: Take 1 tablet (100 mg total) by mouth 2 (two) times daily.    Authorizing Provider: Minna Merritts    Ordering User: Eugenio Hoes, Joseh Sjogren C  . ramipril (ALTACE) 10 MG capsule 30 capsule 1    Sig: Take 1 capsule (10 mg total) by mouth daily.    Authorizing Provider: Minna Merritts    Ordering User: Britt Bottom

## 2016-11-14 NOTE — Telephone Encounter (Signed)
The meds have not arrived from mail order and the patient is out of meds please send temporary rx for these to [facility] [address]

## 2016-11-22 ENCOUNTER — Telehealth: Payer: Self-pay | Admitting: *Deleted

## 2016-11-22 NOTE — Telephone Encounter (Signed)
No answer. Left detailed message, ok per dpr, to get preoperative lab work and chest xray at the [facility] this afternoon or first this tomorrow if possible and to call back if he has any questions.

## 2016-11-23 ENCOUNTER — Encounter: Payer: Self-pay | Admitting: Cardiovascular Disease

## 2016-11-23 ENCOUNTER — Other Ambulatory Visit
Admission: RE | Admit: 2016-11-23 | Discharge: 2016-11-23 | Disposition: A | Discharge status: Home / Self Care | Payer: BLUE CROSS/BLUE SHIELD | Source: Ambulatory Visit | Attending: Cardiovascular Disease | Admitting: Cardiovascular Disease

## 2016-11-23 ENCOUNTER — Ambulatory Visit
Admission: RE | Admit: 2016-11-23 | Discharge: 2016-11-23 | Disposition: A | Discharge status: Home / Self Care | Payer: BLUE CROSS/BLUE SHIELD | Source: Ambulatory Visit | Attending: Cardiovascular Disease | Admitting: Cardiovascular Disease

## 2016-11-23 DIAGNOSIS — I2510 Atherosclerotic heart disease of native coronary artery without angina pectoris: Secondary | ICD-10-CM | POA: Insufficient documentation

## 2016-11-23 DIAGNOSIS — Z01810 Encounter for preprocedural cardiovascular examination: Secondary | ICD-10-CM

## 2016-11-23 DIAGNOSIS — Z951 Presence of aortocoronary bypass graft: Secondary | ICD-10-CM | POA: Insufficient documentation

## 2016-11-23 DIAGNOSIS — M47814 Spondylosis without myelopathy or radiculopathy, thoracic region: Secondary | ICD-10-CM | POA: Diagnosis not present

## 2016-11-23 DIAGNOSIS — R079 Chest pain, unspecified: Secondary | ICD-10-CM

## 2016-11-23 LAB — CBC WITH DIFFERENTIAL/PLATELET
BASOS PCT: 1 %
Basophils Absolute: 0.1 10*3/uL (ref 0–0.1)
Eosinophils Absolute: 0.1 10*3/uL (ref 0–0.7)
Eosinophils Relative: 2 %
HEMATOCRIT: 47.4 % (ref 40.0–52.0)
Hemoglobin: 16.1 g/dL (ref 13.0–18.0)
LYMPHS ABS: 1.4 10*3/uL (ref 1.0–3.6)
Lymphocytes Relative: 24 %
MCH: 31.4 pg (ref 26.0–34.0)
MCHC: 33.9 g/dL (ref 32.0–36.0)
MCV: 92.6 fL (ref 80.0–100.0)
MONO ABS: 0.5 10*3/uL (ref 0.2–1.0)
MONOS PCT: 9 %
NEUTROS ABS: 3.9 10*3/uL (ref 1.4–6.5)
Neutrophils Relative %: 64 %
Platelets: 137 10*3/uL — ABNORMAL LOW (ref 150–440)
RBC: 5.12 MIL/uL (ref 4.40–5.90)
RDW: 13.8 % (ref 11.5–14.5)
WBC: 6 10*3/uL (ref 3.8–10.6)

## 2016-11-23 LAB — BASIC METABOLIC PANEL
ANION GAP: 16 — AB (ref 5–15)
BUN: 16 mg/dL (ref 6–20)
CALCIUM: 9.3 mg/dL (ref 8.9–10.3)
CO2: 25 mmol/L (ref 22–32)
Chloride: 98 mmol/L — ABNORMAL LOW (ref 101–111)
Creatinine, Ser: 0.84 mg/dL (ref 0.61–1.24)
GFR calc Af Amer: >60 mL/min (ref >60)
GFR calc non Af Amer: >60 mL/min (ref >60)
GLUCOSE: 200 mg/dL — AB (ref 65–99)
Potassium: 3.7 mmol/L (ref 3.5–5.1)
Sodium: 139 mmol/L (ref 135–145)

## 2016-11-23 LAB — PROTIME-INR
INR: 1.16
Prothrombin Time: 14.7 seconds (ref 11.4–15.2)

## 2016-11-23 NOTE — OR Nursing (Signed)
Spoke with sharon, rn at dr. Tyrell Antonio office to notify that no orders for tomorrow's procedure have been entered

## 2016-11-23 NOTE — Telephone Encounter (Signed)
This encounter was created in error - please disregard.

## 2016-11-23 NOTE — Telephone Encounter (Signed)
Called patient and he just had chest xray and is sitting in the lab right now for blood draw.

## 2016-11-24 ENCOUNTER — Encounter
Admission: RE | Disposition: A | Discharge status: Home / Self Care | Payer: Self-pay | Source: Ambulatory Visit | Attending: Cardiovascular Disease

## 2016-11-24 ENCOUNTER — Ambulatory Visit
Admission: RE | Admit: 2016-11-24 | Discharge: 2016-11-24 | Disposition: A | Discharge status: Home / Self Care | Payer: BLUE CROSS/BLUE SHIELD | Source: Ambulatory Visit | Attending: Cardiovascular Disease | Admitting: Cardiovascular Disease

## 2016-11-24 DIAGNOSIS — I25718 Atherosclerosis of autologous vein coronary artery bypass graft(s) with other forms of angina pectoris: Secondary | ICD-10-CM | POA: Insufficient documentation

## 2016-11-24 DIAGNOSIS — E1165 Type 2 diabetes mellitus with hyperglycemia: Secondary | ICD-10-CM | POA: Diagnosis not present

## 2016-11-24 DIAGNOSIS — I129 Hypertensive chronic kidney disease with stage 1 through stage 4 chronic kidney disease, or unspecified chronic kidney disease: Secondary | ICD-10-CM | POA: Diagnosis not present

## 2016-11-24 DIAGNOSIS — I25708 Atherosclerosis of coronary artery bypass graft(s), unspecified, with other forms of angina pectoris: Secondary | ICD-10-CM | POA: Diagnosis not present

## 2016-11-24 DIAGNOSIS — Z7982 Long term (current) use of aspirin: Secondary | ICD-10-CM | POA: Diagnosis not present

## 2016-11-24 DIAGNOSIS — Z794 Long term (current) use of insulin: Secondary | ICD-10-CM | POA: Diagnosis not present

## 2016-11-24 DIAGNOSIS — Z7902 Long term (current) use of antithrombotics/antiplatelets: Secondary | ICD-10-CM | POA: Diagnosis not present

## 2016-11-24 DIAGNOSIS — Z79890 Hormone replacement therapy (postmenopausal): Secondary | ICD-10-CM | POA: Insufficient documentation

## 2016-11-24 DIAGNOSIS — I25119 Atherosclerotic heart disease of native coronary artery with unspecified angina pectoris: Secondary | ICD-10-CM | POA: Diagnosis present

## 2016-11-24 DIAGNOSIS — Z951 Presence of aortocoronary bypass graft: Secondary | ICD-10-CM | POA: Insufficient documentation

## 2016-11-24 DIAGNOSIS — G4730 Sleep apnea, unspecified: Secondary | ICD-10-CM | POA: Insufficient documentation

## 2016-11-24 DIAGNOSIS — I252 Old myocardial infarction: Secondary | ICD-10-CM | POA: Insufficient documentation

## 2016-11-24 DIAGNOSIS — I25118 Atherosclerotic heart disease of native coronary artery with other forms of angina pectoris: Secondary | ICD-10-CM | POA: Diagnosis not present

## 2016-11-24 DIAGNOSIS — E039 Hypothyroidism, unspecified: Secondary | ICD-10-CM | POA: Insufficient documentation

## 2016-11-24 DIAGNOSIS — J45909 Unspecified asthma, uncomplicated: Secondary | ICD-10-CM | POA: Insufficient documentation

## 2016-11-24 DIAGNOSIS — Z79899 Other long term (current) drug therapy: Secondary | ICD-10-CM | POA: Insufficient documentation

## 2016-11-24 DIAGNOSIS — I480 Paroxysmal atrial fibrillation: Secondary | ICD-10-CM | POA: Diagnosis not present

## 2016-11-24 DIAGNOSIS — E782 Mixed hyperlipidemia: Secondary | ICD-10-CM | POA: Diagnosis not present

## 2016-11-24 DIAGNOSIS — Z8249 Family history of ischemic heart disease and other diseases of the circulatory system: Secondary | ICD-10-CM | POA: Insufficient documentation

## 2016-11-24 HISTORY — PX: LEFT HEART CATH AND CORONARY ANGIOGRAPHY: CATH118249

## 2016-11-24 HISTORY — PX: CORONARY STENT INTERVENTION: CATH118234

## 2016-11-24 HISTORY — PX: CARDIAC CATHETERIZATION: SHX172

## 2016-11-24 LAB — POCT ACTIVATED CLOTTING TIME: ACTIVATED CLOTTING TIME: 318 s

## 2016-11-24 SURGERY — LEFT HEART CATH AND CORONARY ANGIOGRAPHY
Anesthesia: Moderate Sedation

## 2016-11-24 MED ORDER — SODIUM CHLORIDE 0.9% FLUSH: 3.0000 mL | INTRAVENOUS | Status: DC | PRN

## 2016-11-24 MED ORDER — NITROGLYCERIN 1 MG/10 ML FOR IR/CATH LAB
INTRA_ARTERIAL | Status: DC | PRN
  Administered 2016-11-24 (×2): 200 ug via INTRACORONARY

## 2016-11-24 MED ORDER — MIDAZOLAM HCL 2 MG/2ML IJ SOLN
INTRAMUSCULAR | Status: DC | PRN
  Administered 2016-11-24: 1 mg via INTRAVENOUS
  Administered 2016-11-24: 0.5 mg via INTRAVENOUS

## 2016-11-24 MED ORDER — HEPARIN (PORCINE) IN NACL 2-0.9 UNIT/ML-% IJ SOLN
INTRAMUSCULAR | Status: AC
  Filled 2016-11-24: qty 500

## 2016-11-24 MED ORDER — METOPROLOL TARTRATE 5 MG/5ML IV SOLN
INTRAVENOUS | Status: AC
  Filled 2016-11-24: qty 5

## 2016-11-24 MED ORDER — SODIUM CHLORIDE 0.9 % IV SOLN: INTRAVENOUS | Status: DC

## 2016-11-24 MED ORDER — METOPROLOL TARTRATE 5 MG/5ML IV SOLN
INTRAVENOUS | Status: DC | PRN
  Administered 2016-11-24: 2.5 mg via INTRAVENOUS

## 2016-11-24 MED ORDER — ASPIRIN 81 MG PO CHEW
CHEWABLE_TABLET | ORAL | Status: AC
  Administered 2016-11-24: 81 mg via ORAL
  Filled 2016-11-24: qty 1

## 2016-11-24 MED ORDER — HEPARIN SODIUM (PORCINE) 1000 UNIT/ML IJ SOLN
INTRAMUSCULAR | Status: DC | PRN
  Administered 2016-11-24: 5500 [IU] via INTRAVENOUS
  Administered 2016-11-24: 4500 [IU] via INTRAVENOUS

## 2016-11-24 MED ORDER — VERAPAMIL HCL 2.5 MG/ML IV SOLN
INTRAVENOUS | Status: DC | PRN
  Administered 2016-11-24: 2.5 mg via INTRA_ARTERIAL

## 2016-11-24 MED ORDER — SODIUM CHLORIDE 0.9% FLUSH: 3.0000 mL | Freq: Two times a day (BID) | INTRAVENOUS | Status: DC

## 2016-11-24 MED ORDER — SODIUM CHLORIDE 0.9 % IV SOLN
INTRAVENOUS | Status: DC
  Administered 2016-11-24: 1000 mL via INTRAVENOUS

## 2016-11-24 MED ORDER — VERAPAMIL HCL 2.5 MG/ML IV SOLN
INTRAVENOUS | Status: AC
  Filled 2016-11-24: qty 2

## 2016-11-24 MED ORDER — CLOPIDOGREL BISULFATE 75 MG PO TABS
ORAL_TABLET | ORAL | Status: AC
  Filled 2016-11-24: qty 4

## 2016-11-24 MED ORDER — MIDAZOLAM HCL 2 MG/2ML IJ SOLN
INTRAMUSCULAR | Status: AC
  Filled 2016-11-24: qty 2

## 2016-11-24 MED ORDER — SODIUM CHLORIDE 0.9 % IV SOLN: 250.0000 mL | INTRAVENOUS | Status: DC | PRN

## 2016-11-24 MED ORDER — IOPAMIDOL (ISOVUE-300) INJECTION 61%
INTRAVENOUS | Status: DC | PRN
  Administered 2016-11-24: 150 mL via INTRA_ARTERIAL

## 2016-11-24 MED ORDER — ASPIRIN 81 MG PO CHEW
81.0000 mg | CHEWABLE_TABLET | ORAL | Status: AC
  Administered 2016-11-24: 81 mg via ORAL

## 2016-11-24 MED ORDER — FENTANYL CITRATE (PF) 100 MCG/2ML IJ SOLN
INTRAMUSCULAR | Status: DC | PRN
  Administered 2016-11-24: 50 ug via INTRAVENOUS
  Administered 2016-11-24: 25 ug via INTRAVENOUS

## 2016-11-24 MED ORDER — NITROGLYCERIN 5 MG/ML IV SOLN
INTRAVENOUS | Status: AC
  Filled 2016-11-24: qty 10

## 2016-11-24 MED ORDER — HEPARIN SODIUM (PORCINE) 1000 UNIT/ML IJ SOLN
INTRAMUSCULAR | Status: AC
  Filled 2016-11-24: qty 1

## 2016-11-24 MED ORDER — FENTANYL CITRATE (PF) 100 MCG/2ML IJ SOLN
INTRAMUSCULAR | Status: AC
  Filled 2016-11-24: qty 2

## 2016-11-24 SURGICAL SUPPLY — 16 items
BALLN TREK OTW 2.5X12 (BALLOONS) ×3
BALLN TREK RX 2.5X20 (BALLOONS) ×3
BALLOON TREK OTW 2.5X12 (BALLOONS) ×2 IMPLANT
BALLOON TREK RX 2.5X20 (BALLOONS) ×2 IMPLANT
CATH 5F 110X4 TIG (CATHETERS) ×3 IMPLANT
CATH INFINITI 5 FR IM (CATHETERS) ×3 IMPLANT
CATH VISTA GUIDE 6FR JR4 (CATHETERS) ×3 IMPLANT
DEVICE INFLAT 30 PLUS (MISCELLANEOUS) ×3 IMPLANT
DEVICE RAD TR BAND REGULAR (VASCULAR PRODUCTS) ×3 IMPLANT
GLIDESHEATH SLEND SS 6F .021 (SHEATH) ×3 IMPLANT
KIT MANI 3VAL PERCEP (MISCELLANEOUS) ×3 IMPLANT
PACK CARDIAC CATH (CUSTOM PROCEDURE TRAY) ×3 IMPLANT
WIRE HI TORQ WHISPER MS 190CM (WIRE) ×3 IMPLANT
WIRE ROSEN-J .035X260CM (WIRE) ×3 IMPLANT
WIRE RUNTHROUGH .014X180CM (WIRE) ×3 IMPLANT
WIRE RUNTHROUGH .014X300CM (WIRE) ×3 IMPLANT

## 2016-11-24 NOTE — Interval H&P Note (Signed)
Cath Lab Visit (complete for each Cath Lab visit)  Clinical Evaluation Leading to the Procedure:   ACS: No.  Non-ACS:    Anginal Classification: CCS III  Anti-ischemic medical therapy: Minimal Therapy (1 class of medications)  Non-Invasive Test Results: High-risk stress test findings: cardiac mortality >3%/year  Prior CABG: Previous CABG      History and Physical Interval Note:  11/24/2016 8:15 AM  Juan [name]  has presented today for surgery, with the diagnosis of LT Cath    Chest pain  The various methods of treatment have been discussed with the patient and family. After consideration of risks, benefits and other options for treatment, the patient has consented to  Procedure(s): LEFT HEART CATH AND CORONARY ANGIOGRAPHY (Left) as a surgical intervention .  The patient's history has been reviewed, patient examined, no change in status, stable for surgery.  I have reviewed the patient's chart and labs.  Questions were answered to the patient's satisfaction.     Juan [name]

## 2016-11-24 NOTE — Discharge Instructions (Signed)
Radial Site Care Refer to this sheet in the next few weeks. These instructions provide you with information about caring for yourself after your procedure. Your health care provider may also give you more specific instructions. Your treatment has been planned according to current medical practices, but problems sometimes occur. Call your health care provider if you have any problems or questions after your procedure. What can I expect after the procedure? After your procedure, it is typical to have the following:  Bruising at the radial site that usually fades within 1-2 weeks.  Blood collecting in the tissue (hematoma) that may be painful to the touch. It should usually decrease in size and tenderness within 1-2 weeks.  Follow these instructions at home:  Take medicines only as directed by your health care provider.  You may shower 24-48 hours after the procedure or as directed by your health care provider. Remove the bandage (dressing) and gently wash the site with plain soap and water. Pat the area dry with a clean towel. Do not rub the site, because this may cause bleeding.  Do not take baths, swim, or use a hot tub until your health care provider approves.  Check your insertion site every day for redness, swelling, or drainage.  Do not apply powder or lotion to the site.  Do not flex or bend the affected arm for 24 hours or as directed by your health care provider.  Do not push or pull heavy objects with the affected arm for 24 hours or as directed by your health care provider.  Do not lift over 10 lb (4.5 kg) for 5 days after your procedure or as directed by your health care provider.  Ask your health care provider when it is okay to: ? Return to work or school. ? Resume usual physical activities or sports. ? Resume sexual activity.  Do not drive home if you are discharged the same day as the procedure. Have someone else drive you.  You may drive 24 hours after the procedure  unless otherwise instructed by your health care provider.  Do not operate machinery or power tools for 24 hours after the procedure.  If your procedure was done as an outpatient procedure, which means that you went home the same day as your procedure, a responsible adult should be with you for the first 24 hours after you arrive home.  Keep all follow-up visits as directed by your health care provider. This is important. Contact a health care provider if:  You have a fever.  You have chills.  You have increased bleeding from the radial site. Hold pressure on the site. Get help right away if:  You have unusual pain at the radial site.  You have redness, warmth, or swelling at the radial site.  You have drainage (other than a small amount of blood on the dressing) from the radial site.  The radial site is bleeding, and the bleeding does not stop after 30 minutes of holding steady pressure on the site.  Your arm or hand becomes pale, cool, tingly, or numb. This information is not intended to replace advice given to you by your health care provider. Make sure you discuss any questions you have with your health care provider. Document Released: 02/18/2010 Document Revised: 06/24/2015 Document Reviewed: 08/04/2013 Elsevier Interactive Patient Education  2018 [employer]. Resume Metformin after 2 days.

## 2016-11-24 NOTE — H&P (View-Only) (Signed)
Cardiology Office Note  Date:  10/31/2016   ID:  Juan [name], DOB 1955/10/11, MRN [id number]  PCP:  Kelton Pillar, MD   Chief Complaint  Patient presents with  . other    6 month f/u c/o chest pain with exertion. Meds reviewed verbally with pt.    HPI:  Juan [name] is a very pleasant 61 year old male with a history of   coronary artery disease,  multiple cardiac catheterizations with intervention dating back to 1994,  total of 8 stents,  diabetes  hyperlipidemia,  Paroxysmal atrial fibrillation, post op CABG  presented to [facility] with angina, Cardiac catheterization showing three-vessel disease, referred to [facility] for CABG. 01/29/15 He presents today for follow-up of his coronary artery disease, CABG  Diabetes numbers have been running high now up 8.7 Using larger amounts of insulin  Reports having more chest pain since his last clinic visit Seems to happen with lower amounts of exercise Reports it is a definite change since after his bypass surgery  Other stressors including wedding coming up in 2 or 3 weeks, daughter Has not been taking nitroglycerin Previously was exercising for long periods of time, now has to cut back on his exercise dramatically  Ab work reviewed with him in detail Hemoglobin 8.7, total cholesterol 165 which is up from 133 last year  EKG personally reviewed by myself on todays visit Shows normal sinus rhythm rate 92 bpm T-wave abnormality anterolateral leads, seen on previous EKGs  Other past medical history reviewed No smoking history. Reports his father also had coronary artery disease in his 68s.  Coronary artery bypass grafting x4 (left internal mammary artery to LAD, saphenous vein graft to posterior descending, saphenous vein graft to OM1, saphenous vein graft to OM2) withEndoscopic harvest of bilateral leg greater saphenous vein by Dr. Prescott Gum on 01/29/2015.  cardiac catheterization 01/21/2015 showed  Prox RCA to Mid  RCA lesion, 70% stenosed. The lesion was previously treated with a stent (unknown type), Prox RCA lesion, 70% stenosed.  Mid RCA lesion, 90% stenosed.  Mid Cx lesion, 90% stenosed, Ost Cx lesion, 90% stenosed.Ost 1st Mrg to 1st Mrg lesion, 90% stenosed.  Mid LAD lesion, 80% stenosed, 1st Diag lesion, 70% stenosed.  1. Severe diffuse calcified three-vessel coronary artery disease with previous multivessel stenting. 2. Normal LV systolic function and mildly elevated left ventricular end-diastolic pressure.  postoperatively he had brief period of atrial tachycardia/atrial fibrillation, started on amiodarone, Converted to normal sinus rhythm.  PMH:   has a past medical history of Asthma; Chronic kidney disease; Coronary artery disease; Essential hypertension; Heart murmur; Hyperlipidemia; Hypothyroidism; Mild sleep apnea; Myocardial infarction ([place]) (1996); and Type II diabetes mellitus ([place]).  PSH:    Past Surgical History:  Procedure Laterality Date  . [place]; Dr.Harold; [facility].  . [place]; Dr. Joneen Boers; [facility]  . CARDIAC CATHETERIZATION  1996   ""  . CARDIAC CATHETERIZATION     ''''  . CARDIAC CATHETERIZATION     '''  . CARDIAC CATHETERIZATION     '''  . CARDIAC CATHETERIZATION  2002   '''  . CARDIAC CATHETERIZATION  2006   [facility]; [place].   Marland Kitchen CARDIAC CATHETERIZATION N/A 01/21/2015   Procedure: Left Heart Cath and Coronary Angiography;  Surgeon: Wellington Hampshire, MD;  Location: [place] [facility];  Service: Cardiovascular;  Laterality: N/A;  . cardiac stent     x 10  . CORONARY ARTERY BYPASS  GRAFT N/A 01/29/2015   Procedure: CORONARY ARTERY BYPASS GRAFTING (CABG)x four using left internal mammary artery artery and bilateral saphenous thigh vein using endoscope.;  Surgeon: Ivin Poot, MD;  Location: [place];  Service: Open Heart Surgery;  Laterality:  N/A;  . CYST EXCISION     right- wrist- ganglion   . NASAL SEPTUM SURGERY    . TEE WITHOUT CARDIOVERSION N/A 01/29/2015   Procedure: TRANSESOPHAGEAL ECHOCARDIOGRAM (TEE);  Surgeon: Ivin Poot, MD;  Location: [place];  Service: Open Heart Surgery;  Laterality: N/A;  . THYROIDECTOMY      Current Outpatient Prescriptions  Medication Sig Dispense Refill  . aspirin 81 MG tablet Take 81 mg by mouth daily.    . clopidogrel (PLAVIX) 75 MG tablet Take 1 tablet (75 mg total) by mouth daily. 90 tablet 3  . Coenzyme Q10 (CO Q 10 PO) Take 100 mg by mouth daily.    . Insulin Glargine (LANTUS) 100 UNIT/ML Solostar Pen Inject 24 Units into the skin daily at 10 pm. (Patient taking differently: Inject 52 Units into the skin daily at 10 pm. ) 15 mL 11  . levothyroxine (SYNTHROID) 100 MCG tablet Take 1 tablet (100 mcg total) by mouth daily before breakfast. 90 tablet 0  . metFORMIN (GLUCOPHAGE-XR) 500 MG 24 hr tablet Take 1 tablet (500 mg total) by mouth 2 (two) times daily with a meal. 60 tablet 1  . metoprolol (LOPRESSOR) 50 MG tablet TAKE 1 TABLET BY MOUTH TWICE DAILY 180 tablet 3  . Multiple Vitamin (MULTIVITAMIN) tablet Take 1 tablet by mouth 2 (two) times daily.     . nitroGLYCERIN (NITROSTAT) 0.4 MG SL tablet Place 0.4 mg under the tongue every 5 (five) minutes as needed for chest pain.    . Omega-3 Fatty Acids (FISH OIL PO) Take 900 mg by mouth daily.     . ramipril (ALTACE) 5 MG capsule TAKE 1 CAPSULE BY MOUTH DAILY 90 capsule 0  . simvastatin (ZOCOR) 40 MG tablet TAKE 1 TABLET BY MOUTH DAILY 90 tablet 3   No current facility-administered medications for this visit.      Allergies:   Patient has no known allergies.   Social History:  The patient  reports that he has never smoked. He has never used smokeless tobacco. He reports that he drinks about 0.5 oz of alcohol per week . He reports that he does not use drugs.   Family History:   family history includes Cancer (age of onset: 53) in his  father; Cirrhosis in his sister; Drug abuse in his sister; Heart attack in his father and mother.    Review of Systems: Review of Systems  Constitutional: Negative.   Respiratory: Negative.   Cardiovascular: Positive for chest pain.  Gastrointestinal: Negative.   Musculoskeletal: Negative.   Neurological: Negative.   Psychiatric/Behavioral: Negative.   All other systems reviewed and are negative.    PHYSICAL EXAM: VS:  BP 132/80 (BP Location: Left Arm, Patient Position: Sitting, Cuff Size: Normal)   Pulse 92   Ht 5\' 8"  (1.727 m)   Wt 207 lb (93.9 kg)   BMI 31.47 kg/m  , BMI Body mass index is 31.47 kg/m.  GEN: Well nourished, well developed, in no acute distress  HEENT: normal  Neck: no JVD, carotid bruits, or masses Cardiac: RRR; no murmurs, rubs, or gallops,no edema  Respiratory:  clear to auscultation bilaterally, normal work of breathing GI: soft, nontender, nondistended, + BS MS: no deformity or atrophy  Skin:  warm and dry, no rash Neuro:  Strength and sensation are intact Psych: euthymic mood, full affect    Recent Labs: No results found for requested labs within last 8760 hours.    Lipid Panel Lab Results  Component Value Date   CHOL 197 02/16/2014   HDL 34.10 (L) 02/16/2014   LDLCALC 61 04/11/2013   TRIG 402.0 (H) 02/16/2014      Wt Readings from Last 3 Encounters:  10/31/16 207 lb (93.9 kg)  05/10/16 208 lb 4 oz (94.5 kg)  10/28/15 203 lb 8 oz (92.3 kg)       ASSESSMENT AND PLAN:  Coronary artery disease of native artery of native heart with stable angina pectoris ([place]) On last visit had one episode of chest pain resolved on its own Now reports having more episodes of chest pain at lower levels of exercise Feels it is a definite progression over the past several months Did not have chest pain following bypass surgery Long time spent discussing various treatment options including stress testing versus catheterization After discussing risk  and benefit invasive, noninvasive, we will schedule perfusion Myoview  scan at this time. May need cardiac catheterization at a later date if symptoms progress to unstable angina or for positive stress test  Mixed hyperlipidemia Cholesterol is well above goal likely from poor control diabetes Cholesterol last year 133 now 165 Suggested he consider meeting with endocrinology given his insulin requirements have gone up dramatically  Essential hypertension reports blood pressure has been running higher, recommended he increase ramipril up to 10 mg daily, increase metoprolol up to 100 mg twice a day This will also help with anginal symptoms  Paroxysmal atrial fibrillation ([place])  postoperatively he had brief period of atrial tachycardia/atrial fibrillation, started on amiodarone,  Maintaining NSR  S/P coronary artery stent placement Having stable angina symptoms, increasing over the past several months as above Order placed for stress testing  Uncontrolled type 2 diabetes mellitus with complication, with long-term current use of insulin ([place]) We have encouraged careful diet management in an effort to lose weight.  S/P CABG (coronary artery bypass graft) STress test has been ordered as above to rule out ischemia   Total encounter time more than 45 minutes  Greater than 50% was spent in counseling and coordination of care with the patient   Disposition:   F/U  3 months   Orders Placed This Encounter  Procedures  . EKG 12-Lead     Signed, Esmond Plants, M.D., Ph.D. 10/31/2016  [place], [place]

## 2016-11-27 ENCOUNTER — Telehealth: Payer: Self-pay | Admitting: Cardiovascular Disease

## 2016-11-27 NOTE — Telephone Encounter (Signed)
PT wife calling PT was to have a cath this weekend but it was postponed PT was to hear back from doctors office and has not heard anything and wanting to know next steps Please call to advise

## 2016-11-27 NOTE — Telephone Encounter (Addendum)
Oct 2 OV with Dr. Rockey Situ; c/o chest pain. Pt proceeded with lexi myoview on Oct 5 which suggested blockage. Dr. Rockey Situ recommended cardiac cath which was scheduled Oct 26 w/Dr. Fletcher Anon.  Per cath notes: "Unsuccessful attempted angioplasty of the right coronary artery due to inability to advance the wire into the distal right coronary artery as the result of tortuosity and a bifurcation with a large RV marginal. Recommendations: Continue medical therapy.  I will discuss with my colleagues for a second opinion and reattempted angioplasty" Pt was discharged home.  Pt's wife, Langley Gauss, called today asking what the next step will be.  She voiced concern regarding lack of communication. States Dr. Rockey Situ told them he would be present during the lexi myoview but he was not. Oct 5 lexi myoview resulted Oct 6. Pt called October 9. She is concerned that they should have been called quicker and feels that everything has taken too long, stating there doesn't appear to be a sense of urgency.  States they chose Dr. Rockey Situ to be pt's cardiologist but have been disappointed with lack of communication and sense of urgency.  I apologized to [place], explained process, and assured her I would notify Dr. Rockey Situ and Dr. Fletcher Anon regarding concerns and will call her back today. She is appreciative and again voiced need for a plan of care to be determined soon. Routed to Dr. Rockey Situ and Dr. Fletcher Anon.

## 2016-11-27 NOTE — Telephone Encounter (Addendum)
Routed to Dr. Rockey Situ high priority. Gave Dr. Rockey Situ written note and verbally made him aware that pt requesting call back before 10/31 RCA PCI with Dr. Fletcher Anon.

## 2016-11-27 NOTE — Telephone Encounter (Signed)
Scheduled with [place], [facility]. I s/w pt's wife, Langley Gauss, (ok per [facility]) who is agreeable to RCA PCI at [facility] on Wednesday, October 31, arrival time 10am; procedure time noon. Reviewed pre-procedure instructions including need to hold metformin 24 hours before and for 48 hours after procedure. Pt takes lantus in the morning so wife understands pt should hold this Wednesday morning. Wife wrote down instructions but I have also put them on MyChart for their review. She understands to call back with any questions or concerns. I again apologized for any delay in getting results to patient. She is appreciative of the call.  Dr. Fletcher Anon aware of date.

## 2016-11-27 NOTE — Telephone Encounter (Signed)
His situation is not straightforward and I had to discuss with my colleagues at [facility].  I heard from Dr. Burt Knack on late Friday and I recommend attempted RCA PCI on Wednesday at [facility] by me.  If he is agreeable, you can go ahead and schedule that on Wednesday.

## 2016-11-27 NOTE — Telephone Encounter (Signed)
Patient wants to talk to Dr. Rockey Situ about upcoming procedure   He has questions related to Closed bypass

## 2016-11-28 NOTE — Telephone Encounter (Signed)
Called and left a message around 2 PM Recommended he proceed with the catheterization and stent placement to native right coronary artery at [facility]

## 2016-11-29 ENCOUNTER — Encounter (HOSPITAL_COMMUNITY): Payer: Self-pay | Admitting: General Practice

## 2016-11-29 ENCOUNTER — Encounter (HOSPITAL_COMMUNITY)
Admission: RE | Disposition: A | Discharge status: Home / Self Care | Payer: Self-pay | Source: Ambulatory Visit | Attending: Cardiovascular Disease

## 2016-11-29 ENCOUNTER — Ambulatory Visit (HOSPITAL_COMMUNITY)
Admission: RE | Admit: 2016-11-29 | Discharge: 2016-11-30 | Disposition: A | Discharge status: Home / Self Care | Payer: BLUE CROSS/BLUE SHIELD | Source: Ambulatory Visit | Attending: Cardiovascular Disease | Admitting: Cardiovascular Disease

## 2016-11-29 DIAGNOSIS — Z7982 Long term (current) use of aspirin: Secondary | ICD-10-CM | POA: Insufficient documentation

## 2016-11-29 DIAGNOSIS — Z794 Long term (current) use of insulin: Secondary | ICD-10-CM | POA: Insufficient documentation

## 2016-11-29 DIAGNOSIS — Z955 Presence of coronary angioplasty implant and graft: Secondary | ICD-10-CM | POA: Diagnosis not present

## 2016-11-29 DIAGNOSIS — E039 Hypothyroidism, unspecified: Secondary | ICD-10-CM | POA: Insufficient documentation

## 2016-11-29 DIAGNOSIS — Z8249 Family history of ischemic heart disease and other diseases of the circulatory system: Secondary | ICD-10-CM | POA: Insufficient documentation

## 2016-11-29 DIAGNOSIS — I25718 Atherosclerosis of autologous vein coronary artery bypass graft(s) with other forms of angina pectoris: Secondary | ICD-10-CM | POA: Insufficient documentation

## 2016-11-29 DIAGNOSIS — I2584 Coronary atherosclerosis due to calcified coronary lesion: Secondary | ICD-10-CM | POA: Insufficient documentation

## 2016-11-29 DIAGNOSIS — I208 Other forms of angina pectoris: Secondary | ICD-10-CM | POA: Diagnosis present

## 2016-11-29 DIAGNOSIS — J45909 Unspecified asthma, uncomplicated: Secondary | ICD-10-CM | POA: Diagnosis not present

## 2016-11-29 DIAGNOSIS — E1122 Type 2 diabetes mellitus with diabetic chronic kidney disease: Secondary | ICD-10-CM | POA: Insufficient documentation

## 2016-11-29 DIAGNOSIS — G4730 Sleep apnea, unspecified: Secondary | ICD-10-CM | POA: Insufficient documentation

## 2016-11-29 DIAGNOSIS — N189 Chronic kidney disease, unspecified: Secondary | ICD-10-CM | POA: Insufficient documentation

## 2016-11-29 DIAGNOSIS — I129 Hypertensive chronic kidney disease with stage 1 through stage 4 chronic kidney disease, or unspecified chronic kidney disease: Secondary | ICD-10-CM | POA: Diagnosis not present

## 2016-11-29 DIAGNOSIS — E782 Mixed hyperlipidemia: Secondary | ICD-10-CM | POA: Diagnosis not present

## 2016-11-29 DIAGNOSIS — I480 Paroxysmal atrial fibrillation: Secondary | ICD-10-CM | POA: Insufficient documentation

## 2016-11-29 DIAGNOSIS — I2089 Other forms of angina pectoris: Secondary | ICD-10-CM

## 2016-11-29 DIAGNOSIS — I252 Old myocardial infarction: Secondary | ICD-10-CM | POA: Insufficient documentation

## 2016-11-29 DIAGNOSIS — I25118 Atherosclerotic heart disease of native coronary artery with other forms of angina pectoris: Secondary | ICD-10-CM | POA: Diagnosis not present

## 2016-11-29 HISTORY — DX: Unspecified asthma, uncomplicated: J45.909

## 2016-11-29 HISTORY — PX: CORONARY ANGIOPLASTY WITH STENT PLACEMENT: SHX49

## 2016-11-29 HISTORY — PX: CORONARY STENT INTERVENTION: CATH118234

## 2016-11-29 HISTORY — DX: Personal history of urinary calculi: Z87.442

## 2016-11-29 HISTORY — DX: Gastro-esophageal reflux disease without esophagitis: K21.9

## 2016-11-29 LAB — BASIC METABOLIC PANEL
Anion gap: 11 (ref 5–15)
BUN: 16 mg/dL (ref 6–20)
CO2: 23 mmol/L (ref 22–32)
Calcium: 9.1 mg/dL (ref 8.9–10.3)
Chloride: 101 mmol/L (ref 101–111)
Creatinine, Ser: 0.82 mg/dL (ref 0.61–1.24)
GFR calc Af Amer: >60 mL/min (ref >60)
Glucose, Bld: 252 mg/dL — ABNORMAL HIGH (ref 65–99)
POTASSIUM: 4.4 mmol/L (ref 3.5–5.1)
Sodium: 135 mmol/L (ref 135–145)

## 2016-11-29 LAB — POCT ACTIVATED CLOTTING TIME
ACTIVATED CLOTTING TIME: 290 s
ACTIVATED CLOTTING TIME: 698 s
Activated Clotting Time: 274 seconds

## 2016-11-29 LAB — CBC
HCT: 47.1 % (ref 39.0–52.0)
Hemoglobin: 16.8 g/dL (ref 13.0–17.0)
MCH: 32.2 pg (ref 26.0–34.0)
MCHC: 35.7 g/dL (ref 30.0–36.0)
MCV: 90.4 fL (ref 78.0–100.0)
PLATELETS: 125 10*3/uL — AB (ref 150–400)
RBC: 5.21 MIL/uL (ref 4.22–5.81)
RDW: 13.4 % (ref 11.5–15.5)
WBC: 5.1 10*3/uL (ref 4.0–10.5)

## 2016-11-29 LAB — GLUCOSE, CAPILLARY
Glucose-Capillary: 235 mg/dL — ABNORMAL HIGH (ref 65–99)
Glucose-Capillary: 205 mg/dL — ABNORMAL HIGH (ref 65–99)
Glucose-Capillary: 291 mg/dL — ABNORMAL HIGH (ref 65–99)

## 2016-11-29 SURGERY — CORONARY STENT INTERVENTION
Anesthesia: LOCAL

## 2016-11-29 MED ORDER — NITROGLYCERIN 1 MG/10 ML FOR IR/CATH LAB
INTRA_ARTERIAL | Status: DC | PRN
  Administered 2016-11-29: 200 ug via INTRACORONARY
  Administered 2016-11-29: 200 ug via INTRA_ARTERIAL

## 2016-11-29 MED ORDER — CLOPIDOGREL BISULFATE 300 MG PO TABS
ORAL_TABLET | ORAL | Status: DC | PRN
  Administered 2016-11-29: 300 mg via ORAL

## 2016-11-29 MED ORDER — COQ10 100 MG PO CAPS: 100.0000 mg | ORAL_CAPSULE | Freq: Every day | ORAL | Status: DC

## 2016-11-29 MED ORDER — IOPAMIDOL (ISOVUE-370) INJECTION 76%
INTRAVENOUS | Status: AC
  Filled 2016-11-29: qty 100

## 2016-11-29 MED ORDER — SODIUM CHLORIDE 0.9 % IV SOLN: 250.0000 mL | INTRAVENOUS | Status: DC | PRN

## 2016-11-29 MED ORDER — RAMIPRIL 10 MG PO CAPS
10.0000 mg | ORAL_CAPSULE | Freq: Every day | ORAL | Status: DC
  Administered 2016-11-30: 10:00:00 10 mg via ORAL
  Filled 2016-11-29: qty 1

## 2016-11-29 MED ORDER — HEPARIN SODIUM (PORCINE) 1000 UNIT/ML IJ SOLN
INTRAMUSCULAR | Status: AC
  Filled 2016-11-29: qty 1

## 2016-11-29 MED ORDER — SODIUM CHLORIDE 0.9 % WEIGHT BASED INFUSION: 1.0000 mL/kg/h | INTRAVENOUS | Status: AC

## 2016-11-29 MED ORDER — CLOPIDOGREL BISULFATE 300 MG PO TABS
ORAL_TABLET | ORAL | Status: AC
  Filled 2016-11-29: qty 1

## 2016-11-29 MED ORDER — LIDOCAINE HCL (PF) 2 % IJ SOLN
INTRAMUSCULAR | Status: DC | PRN
  Administered 2016-11-29: 2 mL

## 2016-11-29 MED ORDER — MIDAZOLAM HCL 2 MG/2ML IJ SOLN
INTRAMUSCULAR | Status: DC | PRN
  Administered 2016-11-29: 1 mg via INTRAVENOUS

## 2016-11-29 MED ORDER — MIDAZOLAM HCL 2 MG/2ML IJ SOLN
INTRAMUSCULAR | Status: AC
  Filled 2016-11-29: qty 2

## 2016-11-29 MED ORDER — CLOPIDOGREL BISULFATE 75 MG PO TABS
75.0000 mg | ORAL_TABLET | Freq: Every day | ORAL | Status: DC
  Administered 2016-11-30: 75 mg via ORAL
  Filled 2016-11-29: qty 1

## 2016-11-29 MED ORDER — NITROGLYCERIN 1 MG/10 ML FOR IR/CATH LAB
INTRA_ARTERIAL | Status: AC
  Filled 2016-11-29: qty 10

## 2016-11-29 MED ORDER — SODIUM CHLORIDE 0.9% FLUSH: 3.0000 mL | Freq: Two times a day (BID) | INTRAVENOUS | Status: DC

## 2016-11-29 MED ORDER — FENTANYL CITRATE (PF) 100 MCG/2ML IJ SOLN
INTRAMUSCULAR | Status: DC | PRN
  Administered 2016-11-29: 50 ug via INTRAVENOUS

## 2016-11-29 MED ORDER — INSULIN GLARGINE 100 UNIT/ML ~~LOC~~ SOLN
24.0000 [IU] | Freq: Every day | SUBCUTANEOUS | Status: DC
  Administered 2016-11-29: 21:00:00 24 [IU] via SUBCUTANEOUS
  Filled 2016-11-29: qty 0.24

## 2016-11-29 MED ORDER — SODIUM CHLORIDE 0.9% FLUSH: 3.0000 mL | INTRAVENOUS | Status: DC | PRN

## 2016-11-29 MED ORDER — ACETAMINOPHEN 325 MG PO TABS: 650.0000 mg | ORAL_TABLET | ORAL | Status: DC | PRN

## 2016-11-29 MED ORDER — VERAPAMIL HCL 2.5 MG/ML IV SOLN
INTRAVENOUS | Status: DC | PRN
  Administered 2016-11-29: 14:00:00 via INTRA_ARTERIAL

## 2016-11-29 MED ORDER — LIDOCAINE HCL 2 % IJ SOLN
INTRAMUSCULAR | Status: AC
  Filled 2016-11-29: qty 20

## 2016-11-29 MED ORDER — VERAPAMIL HCL 2.5 MG/ML IV SOLN
INTRAVENOUS | Status: AC
  Filled 2016-11-29: qty 2

## 2016-11-29 MED ORDER — INSULIN ASPART 100 UNIT/ML ~~LOC~~ SOLN
0.0000 [IU] | Freq: Three times a day (TID) | SUBCUTANEOUS | Status: DC
  Administered 2016-11-30: 07:00:00 5 [IU] via SUBCUTANEOUS

## 2016-11-29 MED ORDER — ASPIRIN EC 81 MG PO TBEC
81.0000 mg | DELAYED_RELEASE_TABLET | Freq: Every day | ORAL | Status: DC
  Administered 2016-11-30: 10:00:00 81 mg via ORAL
  Filled 2016-11-29: qty 1

## 2016-11-29 MED ORDER — SIMVASTATIN 40 MG PO TABS
40.0000 mg | ORAL_TABLET | Freq: Every evening | ORAL | Status: DC
  Administered 2016-11-29: 40 mg via ORAL
  Filled 2016-11-29: qty 1
  Filled 2016-11-29: qty 2
  Filled 2016-11-29: qty 1

## 2016-11-29 MED ORDER — ASPIRIN 81 MG PO CHEW: 81.0000 mg | CHEWABLE_TABLET | ORAL | Status: DC

## 2016-11-29 MED ORDER — HEPARIN SODIUM (PORCINE) 1000 UNIT/ML IJ SOLN
INTRAMUSCULAR | Status: DC | PRN
  Administered 2016-11-29: 2000 [IU] via INTRAVENOUS
  Administered 2016-11-29: 10000 [IU] via INTRAVENOUS

## 2016-11-29 MED ORDER — HEPARIN (PORCINE) IN NACL 2-0.9 UNIT/ML-% IJ SOLN
INTRAMUSCULAR | Status: AC
  Filled 2016-11-29: qty 1000

## 2016-11-29 MED ORDER — METOPROLOL TARTRATE 50 MG PO TABS
100.0000 mg | ORAL_TABLET | Freq: Two times a day (BID) | ORAL | Status: DC
  Administered 2016-11-29 – 2016-11-30 (×2): 100 mg via ORAL
  Filled 2016-11-29: qty 2
  Filled 2016-11-29 (×3): qty 4
  Filled 2016-11-29: qty 2

## 2016-11-29 MED ORDER — SODIUM CHLORIDE 0.9% FLUSH
3.0000 mL | Freq: Two times a day (BID) | INTRAVENOUS | Status: DC
  Administered 2016-11-30: 3 mL via INTRAVENOUS

## 2016-11-29 MED ORDER — IOPAMIDOL (ISOVUE-370) INJECTION 76%
INTRAVENOUS | Status: DC | PRN
Start: 2016-11-29 — End: 2016-11-29
  Administered 2016-11-29: 150 mL via INTRA_ARTERIAL

## 2016-11-29 MED ORDER — LEVOTHYROXINE SODIUM 100 MCG PO TABS
100.0000 ug | ORAL_TABLET | Freq: Every day | ORAL | Status: DC
Start: 2016-11-30 — End: 2016-11-30
  Administered 2016-11-30: 100 ug via ORAL
  Filled 2016-11-29: qty 1

## 2016-11-29 MED ORDER — INSULIN ASPART 100 UNIT/ML ~~LOC~~ SOLN
0.0000 [IU] | Freq: Every day | SUBCUTANEOUS | Status: DC
  Administered 2016-11-29: 3 [IU] via SUBCUTANEOUS

## 2016-11-29 MED ORDER — SODIUM CHLORIDE 0.9 % WEIGHT BASED INFUSION
3.0000 mL/kg/h | INTRAVENOUS | Status: DC
Start: 2016-11-29 — End: 2016-11-29
  Administered 2016-11-29: 3 mL/kg/h via INTRAVENOUS

## 2016-11-29 MED ORDER — HEPARIN (PORCINE) IN NACL 2-0.9 UNIT/ML-% IJ SOLN
INTRAMUSCULAR | Status: DC | PRN
  Administered 2016-11-29: 1000 mL via INTRA_ARTERIAL

## 2016-11-29 MED ORDER — ADULT MULTIVITAMIN W/MINERALS CH
1.0000 | ORAL_TABLET | Freq: Two times a day (BID) | ORAL | Status: DC
Start: 2016-11-29 — End: 2016-11-30
  Administered 2016-11-29 – 2016-11-30 (×2): 1 via ORAL
  Filled 2016-11-29 (×3): qty 1

## 2016-11-29 MED ORDER — FENTANYL CITRATE (PF) 100 MCG/2ML IJ SOLN
INTRAMUSCULAR | Status: AC
  Filled 2016-11-29: qty 2

## 2016-11-29 MED ORDER — INSULIN GLARGINE 100 UNIT/ML SOLOSTAR PEN: 24.0000 [IU] | PEN_INJECTOR | Freq: Every day | SUBCUTANEOUS | Status: DC

## 2016-11-29 MED ORDER — CLOPIDOGREL BISULFATE 75 MG PO TABS: 75.0000 mg | ORAL_TABLET | ORAL | Status: DC

## 2016-11-29 MED ORDER — ONDANSETRON HCL 4 MG/2ML IJ SOLN: 4.0000 mg | Freq: Four times a day (QID) | INTRAMUSCULAR | Status: DC | PRN

## 2016-11-29 MED ORDER — NITROGLYCERIN 0.4 MG SL SUBL: 0.4000 mg | SUBLINGUAL_TABLET | SUBLINGUAL | Status: DC | PRN

## 2016-11-29 MED ORDER — SODIUM CHLORIDE 0.9 % WEIGHT BASED INFUSION
1.0000 mL/kg/h | INTRAVENOUS | Status: DC
Start: 2016-11-29 — End: 2016-11-29

## 2016-11-29 SURGICAL SUPPLY — 25 items
BALLN MAVERICK OTW 1.5X15 (BALLOONS) ×2
BALLN MINITREK OTW 2.0X12 (BALLOONS) ×2
BALLN SAPPHIRE 2.0X15 (BALLOONS) ×2
BALLN ~~LOC~~ EMERGE MR 2.5X20 (BALLOONS) ×4
BALLOON MAVERICK OTW 1.5X15 (BALLOONS) ×1 IMPLANT
BALLOON MINITREK OTW 2.0X12 (BALLOONS) ×1 IMPLANT
BALLOON SAPPHIRE 2.0X15 (BALLOONS) ×1 IMPLANT
BALLOON ~~LOC~~ EMERGE MR 2.5X20 (BALLOONS) ×2 IMPLANT
CATH LAUNCHER 6FR AL.75 (CATHETERS) ×2 IMPLANT
CATH LAUNCHER 6FR JR4 (CATHETERS) ×2 IMPLANT
CATH MICROGUIDE FINCRSS 150 CM (MICROCATHETER) ×1 IMPLANT
CATH VISTA GUIDE 6FR XBRCA (CATHETERS) ×2 IMPLANT
DEVICE RAD COMP TR BAND LRG (VASCULAR PRODUCTS) ×2 IMPLANT
GLIDESHEATH SLEND SS 6F .021 (SHEATH) ×2 IMPLANT
GUIDEWIRE INQWIRE 1.5J.035X260 (WIRE) ×1 IMPLANT
INQWIRE 1.5J .035X260CM (WIRE) ×2
KIT ENCORE 26 ADVANTAGE (KITS) ×4 IMPLANT
KIT HEART LEFT (KITS) ×2 IMPLANT
MICROGUIDE FINECROSS 150 CM (MICROCATHETER) ×2
PACK CARDIAC CATHETERIZATION (CUSTOM PROCEDURE TRAY) ×2 IMPLANT
STENT SIERRA 2.25 X 33 MM (Permanent Stent) ×2 IMPLANT
TRANSDUCER W/STOPCOCK (MISCELLANEOUS) ×2 IMPLANT
TUBING CIL FLEX 10 FLL-RA (TUBING) ×2 IMPLANT
WIRE RUNTHROUGH .014X180CM (WIRE) IMPLANT
WIRE RUNTHROUGH .014X300CM (WIRE) ×4 IMPLANT

## 2016-11-29 NOTE — Discharge Instructions (Signed)
° °  HOLD METFORMIN FOR 48 HOURS.  MAY RESUME SAT MORNING.

## 2016-11-29 NOTE — H&P (View-Only) (Signed)
Cardiology Office Note  Date:  10/31/2016   ID:  Juan [name], DOB Apr 07, 1955, MRN [id number]  PCP:  Juan Pillar, MD   Chief Complaint  Patient presents with  . other    6 month f/u c/o chest pain with exertion. Meds reviewed verbally with pt.    HPI:  Mr. Juan [name] is a very pleasant 61 year old male with a history of   coronary artery disease,  multiple cardiac catheterizations with intervention dating back to 1994,  total of 8 stents,  diabetes  hyperlipidemia,  Paroxysmal atrial fibrillation, post op CABG  presented to [facility] with angina, Cardiac catheterization showing three-vessel disease, referred to [facility] for CABG. 01/29/15 He presents today for follow-up of his coronary artery disease, CABG  Diabetes numbers have been running high now up 8.7 Using larger amounts of insulin  Reports having more chest pain since his last clinic visit Seems to happen with lower amounts of exercise Reports it is a definite change since after his bypass surgery  Other stressors including wedding coming up in 2 or 3 weeks, daughter Has not been taking nitroglycerin Previously was exercising for long periods of time, now has to cut back on his exercise dramatically  Ab work reviewed with him in detail Hemoglobin 8.7, total cholesterol 165 which is up from 133 last year  EKG personally reviewed by myself on todays visit Shows normal sinus rhythm rate 92 bpm T-wave abnormality anterolateral leads, seen on previous EKGs  Other past medical history reviewed No smoking history. Reports his father also had coronary artery disease in his 59s.  Coronary artery bypass grafting x4 (left internal mammary artery to LAD, saphenous vein graft to posterior descending, saphenous vein graft to OM1, saphenous vein graft to OM2) withEndoscopic harvest of bilateral leg greater saphenous vein by Dr. Prescott [name] on 01/29/2015.  cardiac catheterization 01/21/2015 showed  Prox RCA to Mid  RCA lesion, 70% stenosed. The lesion was previously treated with a stent (unknown type), Prox RCA lesion, 70% stenosed.  Mid RCA lesion, 90% stenosed.  Mid Cx lesion, 90% stenosed, Ost Cx lesion, 90% stenosed.Ost 1st Mrg to 1st Mrg lesion, 90% stenosed.  Mid LAD lesion, 80% stenosed, 1st Diag lesion, 70% stenosed.  1. Severe diffuse calcified three-vessel coronary artery disease with previous multivessel stenting. 2. Normal LV systolic function and mildly elevated left ventricular end-diastolic pressure.  postoperatively he had brief period of atrial tachycardia/atrial fibrillation, started on amiodarone, Converted to normal sinus rhythm.  PMH:   has a past medical history of Asthma; Chronic kidney disease; Coronary artery disease; Essential hypertension; Heart murmur; Hyperlipidemia; Hypothyroidism; Mild sleep apnea; Myocardial infarction ([place]) (1996); and Type II diabetes mellitus ([place]).  PSH:    Past Surgical History:  Procedure Laterality Date  . [place]; Dr.Harold; [facility].  . [place]; Dr. Joneen [name]; [facility]  . CARDIAC CATHETERIZATION  1996   ""  . CARDIAC CATHETERIZATION     ''''  . CARDIAC CATHETERIZATION     '''  . CARDIAC CATHETERIZATION     '''  . CARDIAC CATHETERIZATION  2002   '''  . CARDIAC CATHETERIZATION  2006   [name]; [place]   Marland Kitchen CARDIAC CATHETERIZATION N/A 01/21/2015   Procedure: Left Heart Cath and Coronary Angiography;  Surgeon: Juan Hampshire, MD;  Location: [place] [facility];  Service: Cardiovascular;  Laterality: N/A;  . cardiac stent     x 10  . CORONARY ARTERY BYPASS  GRAFT N/A 01/29/2015   Procedure: CORONARY ARTERY BYPASS GRAFTING (CABG)x four using left internal mammary artery artery and bilateral saphenous thigh vein using endoscope.;  Surgeon: Juan Poot, MD;  Location: [place];  Service: Open Heart Surgery;  Laterality:  N/A;  . CYST EXCISION     right- wrist- ganglion   . NASAL SEPTUM SURGERY    . TEE WITHOUT CARDIOVERSION N/A 01/29/2015   Procedure: TRANSESOPHAGEAL ECHOCARDIOGRAM (TEE);  Surgeon: Juan Poot, MD;  Location: [place] [name];  Service: Open Heart Surgery;  Laterality: N/A;  . THYROIDECTOMY      Current Outpatient Prescriptions  Medication Sig Dispense Refill  . aspirin 81 MG tablet Take 81 mg by mouth daily.    . clopidogrel (PLAVIX) 75 MG tablet Take 1 tablet (75 mg total) by mouth daily. 90 tablet 3  . Coenzyme Q10 (CO Q 10 PO) Take 100 mg by mouth daily.    . Insulin Glargine (LANTUS) 100 UNIT/ML Solostar Pen Inject 24 Units into the skin daily at 10 pm. (Patient taking differently: Inject 52 Units into the skin daily at 10 pm. ) 15 mL 11  . levothyroxine (SYNTHROID) 100 MCG tablet Take 1 tablet (100 mcg total) by mouth daily before breakfast. 90 tablet 0  . metFORMIN (GLUCOPHAGE-XR) 500 MG 24 hr tablet Take 1 tablet (500 mg total) by mouth 2 (two) times daily with a meal. 60 tablet 1  . metoprolol (LOPRESSOR) 50 MG tablet TAKE 1 TABLET BY MOUTH TWICE DAILY 180 tablet 3  . Multiple Vitamin (MULTIVITAMIN) tablet Take 1 tablet by mouth 2 (two) times daily.     . nitroGLYCERIN (NITROSTAT) 0.4 MG SL tablet Place 0.4 mg under the tongue every 5 (five) minutes as needed for chest pain.    . Omega-3 Fatty Acids (FISH OIL PO) Take 900 mg by mouth daily.     . ramipril (ALTACE) 5 MG capsule TAKE 1 CAPSULE BY MOUTH DAILY 90 capsule 0  . simvastatin (ZOCOR) 40 MG tablet TAKE 1 TABLET BY MOUTH DAILY 90 tablet 3   No current facility-administered medications for this visit.      Allergies:   Patient has no known allergies.   Social History:  The patient  reports that he has never smoked. He has never used smokeless tobacco. He reports that he drinks about 0.5 oz of alcohol per week . He reports that he does not use drugs.   Family History:   family history includes Cancer (age of onset: 76) in his  father; Cirrhosis in his sister; Drug abuse in his sister; Heart attack in his father and mother.    Review of Systems: Review of Systems  Constitutional: Negative.   Respiratory: Negative.   Cardiovascular: Positive for chest pain.  Gastrointestinal: Negative.   Musculoskeletal: Negative.   Neurological: Negative.   Psychiatric/Behavioral: Negative.   All other systems reviewed and are negative.    PHYSICAL EXAM: VS:  BP 132/80 (BP Location: Left Arm, Patient Position: Sitting, Cuff Size: Normal)   Pulse 92   Ht 5\' 8"  (1.727 m)   Wt 207 lb (93.9 kg)   BMI 31.47 kg/m  , BMI Body mass index is 31.47 kg/m.  GEN: Well nourished, well developed, in no acute distress  HEENT: normal  Neck: no JVD, carotid bruits, or masses Cardiac: RRR; no murmurs, rubs, or gallops,no edema  Respiratory:  clear to auscultation bilaterally, normal work of breathing GI: soft, nontender, nondistended, + BS MS: no deformity or atrophy  Skin:  warm and dry, no rash Neuro:  Strength and sensation are intact Psych: euthymic mood, full affect    Recent Labs: No results found for requested labs within last 8760 hours.    Lipid Panel Lab Results  Component Value Date   CHOL 197 02/16/2014   HDL 34.10 (L) 02/16/2014   LDLCALC 61 04/11/2013   TRIG 402.0 (H) 02/16/2014      Wt Readings from Last 3 Encounters:  10/31/16 207 lb (93.9 kg)  05/10/16 208 lb 4 oz (94.5 kg)  10/28/15 203 lb 8 oz (92.3 kg)       ASSESSMENT AND PLAN:  Coronary artery disease of native artery of native heart with stable angina pectoris ([place]) On last visit had one episode of chest pain resolved on its own Now reports having more episodes of chest pain at lower levels of exercise Feels it is a definite progression over the past several months Did not have chest pain following bypass surgery Long time spent discussing various treatment options including stress testing versus catheterization After discussing risk  and benefit invasive, noninvasive, we will schedule perfusion Myoview  scan at this time. May need cardiac catheterization at a later date if symptoms progress to unstable angina or for positive stress test  Mixed hyperlipidemia Cholesterol is well above goal likely from poor control diabetes Cholesterol last year 133 now 165 Suggested he consider meeting with endocrinology given his insulin requirements have gone up dramatically  Essential hypertension reports blood pressure has been running higher, recommended he increase ramipril up to 10 mg daily, increase metoprolol up to 100 mg twice a day This will also help with anginal symptoms  Paroxysmal atrial fibrillation ([place])  postoperatively he had brief period of atrial tachycardia/atrial fibrillation, started on amiodarone,  Maintaining NSR  S/P coronary artery stent placement Having stable angina symptoms, increasing over the past several months as above Order placed for stress testing  Uncontrolled type 2 diabetes mellitus with complication, with long-term current use of insulin ([place]) We have encouraged careful diet management in an effort to lose weight.  S/P CABG (coronary artery bypass graft) STress test has been ordered as above to rule out ischemia   Total encounter time more than 45 minutes  Greater than 50% was spent in counseling and coordination of care with the patient   Disposition:   F/U  3 months   Orders Placed This Encounter  Procedures  . EKG 12-Lead     Signed, Esmond Plants, M.D., Ph.D. 10/31/2016  [place], [place]

## 2016-11-29 NOTE — Progress Notes (Signed)
PHARMACIST - PHYSICIAN ORDER COMMUNICATION  CONCERNING: P&T Medication Policy on Herbal Medications  DESCRIPTION:  This patient's order for:  CoQ10  has been noted.  This product(s) is classified as an "herbal" or natural product. Due to a lack of definitive safety studies or FDA approval, nonstandard manufacturing practices, plus the potential risk of unknown drug-drug interactions while on inpatient medications, the Pharmacy and Therapeutics Committee does not permit the use of "herbal" or natural products of this type within [place].   ACTION TAKEN: The pharmacy department is unable to verify this order at this time and your patient has been informed of this safety policy. Please reevaluate patient's clinical condition at discharge and address if the herbal or natural product(s) should be resumed at that time.  Jamelia Varano S. Kristol Almanzar, PharmD, BCPS Clinical Staff Pharmacist Pager [phone]   

## 2016-11-29 NOTE — Interval H&P Note (Signed)
History and Physical Interval Note:  11/29/2016 1:32 PM  Juan [name]  has presented today for surgery, with the diagnosis of cad  The various methods of treatment have been discussed with the patient and family. After consideration of risks, benefits and other options for treatment, the patient has consented to  Procedure(s): CORONARY STENT INTERVENTION (N/A) as a surgical intervention .  The patient's history has been reviewed, patient examined, no change in status, stable for surgery.  I have reviewed the patient's chart and labs.  Questions were answered to the patient's satisfaction.     Kathlyn Sacramento

## 2016-11-30 ENCOUNTER — Encounter (HOSPITAL_COMMUNITY): Payer: Self-pay | Admitting: Cardiovascular Disease

## 2016-11-30 DIAGNOSIS — I2584 Coronary atherosclerosis due to calcified coronary lesion: Secondary | ICD-10-CM | POA: Diagnosis not present

## 2016-11-30 DIAGNOSIS — I480 Paroxysmal atrial fibrillation: Secondary | ICD-10-CM | POA: Diagnosis not present

## 2016-11-30 DIAGNOSIS — I208 Other forms of angina pectoris: Secondary | ICD-10-CM | POA: Diagnosis not present

## 2016-11-30 DIAGNOSIS — I252 Old myocardial infarction: Secondary | ICD-10-CM | POA: Diagnosis not present

## 2016-11-30 DIAGNOSIS — I25718 Atherosclerosis of autologous vein coronary artery bypass graft(s) with other forms of angina pectoris: Secondary | ICD-10-CM | POA: Diagnosis not present

## 2016-11-30 LAB — GLUCOSE, CAPILLARY: GLUCOSE-CAPILLARY: 233 mg/dL — AB (ref 65–99)

## 2016-11-30 LAB — BASIC METABOLIC PANEL
Anion gap: 8 (ref 5–15)
BUN: 11 mg/dL (ref 6–20)
CALCIUM: 9 mg/dL (ref 8.9–10.3)
CO2: 27 mmol/L (ref 22–32)
CREATININE: 0.82 mg/dL (ref 0.61–1.24)
Chloride: 102 mmol/L (ref 101–111)
GFR calc Af Amer: >60 mL/min (ref >60)
GLUCOSE: 211 mg/dL — AB (ref 65–99)
Potassium: 4.5 mmol/L (ref 3.5–5.1)
SODIUM: 137 mmol/L (ref 135–145)

## 2016-11-30 LAB — CBC
HEMATOCRIT: 44.7 % (ref 39.0–52.0)
Hemoglobin: 15.6 g/dL (ref 13.0–17.0)
MCH: 31.8 pg (ref 26.0–34.0)
MCHC: 34.9 g/dL (ref 30.0–36.0)
MCV: 91.2 fL (ref 78.0–100.0)
PLATELETS: 112 10*3/uL — AB (ref 150–400)
RBC: 4.9 MIL/uL (ref 4.22–5.81)
RDW: 14 % (ref 11.5–15.5)
WBC: 5.5 10*3/uL (ref 4.0–10.5)

## 2016-11-30 MED ORDER — CONTINUOUS BLOOD GLUC SENSOR MISC: 1.0000 | 0 refills | Status: DC

## 2016-11-30 NOTE — Progress Notes (Signed)
CARDIAC REHAB PHASE I   PRE:  Rate/Rhythm:  83 SR  BP:  Sitting: 183/90      SaO2: 98 RA  MODE:  Ambulation: 1000 ft   POST:  Rate/Rhythm: 95 SR  BP:  Sitting: 165/82      SaO2: 98 RA  Pt ambulated 1,000 ft. Pt was very steady and stated he was very active before procedure. Pt denied any complaints of CP or SOB. Pt returned to edge of bed at request with call bell within reach. Ed was completed with wife at bedside. Reviewed stent card, PTCA/Stents, anti-platelet therapy, NTG, diabetic/heart healthy diet, exercise guidelines, restrictions, and CRPII. Pt has completed CRPII 2 years ago, will think about doing it again, but unsure. Pt and wife verbalized understanding of education. Will send CRPII referral to [facility].   [phone]  Carma Lair MS, ACSM CEP  10:09 AM 11/30/2016

## 2016-11-30 NOTE — Progress Notes (Signed)
Discharge instructions reviewed with the patient and his wife to include diet, activity, follow up appointments, medications and prescriptions.  Printed copies provided. Both voice understanding to teaching.  Ambulatory to door.  Home via [place] with his wife driving.

## 2016-11-30 NOTE — Progress Notes (Signed)
Patient has signed and been given copy of consent for CGM/Freestyle [place] research study. MD also notified.  Education done regarding application and changing CGM sensor (alternate every 10 days on back of arms), 12 hour warm-up, use of glucometer/where to buy strips, how to scan CGM for glucose reading and information for PCP. Patient has been given [employer] reader and first sensor for use. Patient has also been given educational packet regarding use CGM sensor including the 1-800 toll free number for any questions, problems or needs related to the [facility] sensors or reader.  Patient to be given Rx. For sensors with prescriptions/discharge paper work.  Sensor applied by patient to (R) Arm at (11:00 am).  Explained that glucose readings will not be available until 12 hours after application. Patient understands that Diabetes coordinator will call them 2 times after discharge (between days 7-12 after d/c from hospital and between days 30-25 after d/c from hospital). Patient verbalizes understanding of use of Freestyle [place] CGM and was told that any issues with blood sugars/diabetes will need to be addressed by PCP.  Diabetes Quality of Life Survey administered to patient.    Thank you, Nani Gasser. Osaze Hubbert, RN, MSN, CDE  Diabetes Coordinator Inpatient Glycemic Control Team Team Pager [phone] (8am-5pm) 11/30/2016 11:23 AM

## 2016-11-30 NOTE — Care Management Note (Signed)
Case Management Note  Patient Details  Name: [name] MRN: [id number] Date of Birth: 02-15-1955  Subjective/Objective:   From home, s/p coronary stent intervention, will be on plavix.  For dc today, no NCM referral, no needs.                Action/Plan:   Expected Discharge Date:  11/30/16               Expected Discharge Plan:  Home/Self Care  In-House Referral:     Discharge planning Services  CM Consult  Post Acute Care Choice:    Choice offered to:     DME Arranged:    DME Agency:     HH Arranged:    HH Agency:     Status of Service:  Completed, signed off  If discussed at [employer] of Stay Meetings, dates discussed:    Additional Comments:  Zenon Mayo, RN 11/30/2016, 9:04 AM

## 2016-11-30 NOTE — Progress Notes (Signed)
Progress Note  Patient Name: Juan [name] Date of Encounter: 11/30/2016  Primary Cardiologist: Dr. Rockey Situ  Subjective   Feeling well. No chest pain, sob or palpitations.   Inpatient Medications    Scheduled Meds: . aspirin EC  81 mg Oral Daily  . clopidogrel  75 mg Oral Daily  . insulin aspart  0-15 Units Subcutaneous TID WC  . insulin aspart  0-5 Units Subcutaneous QHS  . insulin glargine  24 Units Subcutaneous QHS  . levothyroxine  100 mcg Oral QAC breakfast  . metoprolol tartrate  100 mg Oral BID  . multivitamin with minerals  1 tablet Oral BID  . ramipril  10 mg Oral Daily  . simvastatin  40 mg Oral QPM  . sodium chloride flush  3 mL Intravenous Q12H   Continuous Infusions: . sodium chloride     PRN Meds: sodium chloride, acetaminophen, nitroGLYCERIN, ondansetron (ZOFRAN) IV, sodium chloride flush   Vital Signs    Vitals:   11/29/16 1536 11/29/16 1900 11/30/16 0030 11/30/16 0424  BP: 131/66 107/62 110/65 138/78  Pulse: 71 73 73 73  Resp: 17 17 16 13   Temp: 98.7 F (37.1 C) 97.9 F (36.6 C) 97.7 F (36.5 C) 97.7 F (36.5 C)  TempSrc: Oral Oral Oral Oral  SpO2: 97% 98% 98% 100%  Weight:      Height:        Intake/Output Summary (Last 24 hours) at 11/30/16 0725 Last data filed at 11/30/16 0300  Gross per 24 hour  Intake           873.87 ml  Output                0 ml  Net           873.87 ml   Filed Weights   11/29/16 1004  Weight: 207 lb (93.9 kg)    Telemetry    Sinus rhythm at controlled ventricular rate - Personally Reviewed  ECG    Sr with lateral TWI - Personally Reviewed  Physical Exam   GEN: No acute distress.   Neck: No JVD Cardiac: RRR, no murmurs, rubs, or gallops. R radial cath site stable.  Respiratory: Clear to auscultation bilaterally. GI: Soft, nontender, non-distended  MS: No edema; No deformity. Neuro:  Nonfocal  Psych: Normal affect   Labs    Chemistry Recent Labs Lab 11/23/16 1322 11/29/16 1004  11/30/16 0458  NA 139 135 137  K 3.7 4.4 4.5  CL 98* 101 102  CO2 25 23 27   GLUCOSE 200* 252* 211*  BUN 16 16 11   CREATININE 0.84 0.82 0.82  CALCIUM 9.3 9.1 9.0  GFRNONAA >60 >60 >60  GFRAA >60 >60 >60  ANIONGAP 16* 11 8     Hematology Recent Labs Lab 11/23/16 1322 11/29/16 1004 11/30/16 0458  WBC 6.0 5.1 5.5  RBC 5.12 5.21 4.90  HGB 16.1 16.8 15.6  HCT 47.4 47.1 44.7  MCV 92.6 90.4 91.2  MCH 31.4 32.2 31.8  MCHC 33.9 35.7 34.9  RDW 13.8 13.4 14.0  PLT 137* 125* 112*    Radiology    No results found.  Cardiac Studies   CORONARY STENT INTERVENTION  Conclusion     Prox RCA to Mid RCA lesion, 40 %stenosed.  RPDA lesion, 60 %stenosed.  Ost 1st Mrg to 1st Mrg lesion, 90 %stenosed.  Mid Cx lesion, 90 %stenosed.  Ost Cx to Prox Cx lesion, 30 %stenosed.  Ost Cx lesion, 99 %stenosed.  Mid LAD  to Dist LAD lesion, 10 %stenosed.  Mid LAD lesion, 80 %stenosed.  1st Diag lesion, 70 %stenosed.  Acute Mrg lesion, 70 %stenosed.  SVG and is normal in caliber.  SVG.  Origin lesion, 100 %stenosed.  SVG.  Origin lesion, 100 %stenosed.  Dist LAD lesion, 50 %stenosed.  LIMA and is normal in caliber and anatomically normal.  Prox RCA lesion, 60 %stenosed.  A drug eluting stent was successfully placed.  Mid RCA lesion, 95 %stenosed.  Post intervention, there is a 0% residual stenosis.  Dist RCA lesion, 55 %stenosed.   Successful complex angioplasty and drug-eluting stent placement to the mid right coronary artery. Very difficult procedure due to calcified vessel with difficulty advancing equipment.  The RV branch was jailed by the stent but had TIMI-3 flow.  Recommendations: Dual antiplatelet therapy for at least 6 months.  Aggressive treatment of risk factors.   Diagnostic Diagram       Post-Intervention Diagram          Patient Profile     61 y.o. male with hx of CAD s/p CABG and multiple stents, HLD, PAF, DM, HTN, and CKD presented  for scheduled complex PCI.   Recently seen by Dr. Rockey Situ with symptoms concerning for [place]. Followed up cath 11/24/16 by Dr. Fletcher Anon as below:  1.  Significant underlying three-vessel coronary artery disease with patent LIMA to LAD and patent SVG to OM1.  Occluded SVG to OM 2 and SVG to right PDA. 2.  Low normal LV systolic function with an EF of 50% with normal left ventricular end-diastolic pressure. 3.  Unsuccessful attempted angioplasty of the right coronary artery due to inability to advance the wire into the distal right coronary artery as the result of tortuosity and a bifurcation with a large RV marginal.  Recommendations: Continue medical therapy.  I will discuss with my colleagues for a second opinion and reattempted angioplasty.    Assessment & Plan    1. CAD  - Cath with patent LIMA to LAD and patent SVG to OM1.  Occluded SVG to OM 2 and SVG to right PDA. S/p Successful complex angioplasty and drug-eluting stent placement to the mid right coronary artery. Very difficult procedure due to calcified vessel with difficulty advancing equipment.  The RV branch was jailed by the stent but had TIMI-3 flow. - Continue DAPA, statin, BB and ACE. Ambulate.   2. HLD - No results found for requested labs within last 8760 hours.  - Continue statin. He will bring lipid profile during clinic visit.   3. HTN - Stable on current medication  4. DM - Resume home meds.Last A1c 8.7. Follow up with PCP.    For questions or updates, please contact [place] Please consult [email or web address] for contact info under Cardiology/STEMI.      Signed, Leanor Kail, PA  11/30/2016, 7:25 AM    Patient seen, examined. Available data reviewed. Agree with findings, assessment, and plan as outlined by Robbie Lis, PA.  The patient is independently interviewed and examined.  Heart is regular rate and rhythm with a 2/6 systolic ejection murmur at the right upper sternal border, lungs are clear  bilaterally, the right radial site is clear, the abdomen is soft and nontender, there is no pretibial edema.  The patient's cardiac catheterization study from yesterday is reviewed.  He underwent complex PCI of the right coronary artery with successful stenting of severe stenosis in the mid to distal vessel.  His residual coronary disease will be treated medically.  We  reviewed the importance of treating his hyperlipidemia aggressively.  He has not been able to take Crestor or Lipitor because of elevated LFTs on these agents.  He continues on simvastatin.  He states that he did not improve his lipid panel in the past.  If his LDL cholesterol is above 70 it might be reasonable to consider Repatha or Praluent.  We will defer to his primary care physician and to Dr. Rockey Situ.  He has labs scheduled next week.  Otherwise he will continue on his current medical program.  Sherren Mocha, M.D. 11/30/2016 8:27 AM

## 2016-11-30 NOTE — Discharge Summary (Signed)
Discharge Summary    Patient ID: Juan [name],  MRN: [id number], DOB/AGE: 08-28-1955 61 y.o.  Admit date: 11/29/2016 Discharge date: 11/30/2016  Primary Care Provider: Kelton Pillar Primary Cardiologist: Dr. Rockey Situ  Discharge Diagnoses    Active Problems:   Effort angina ([place])   HLD   HTN   DM  Allergies No Known Allergies  Diagnostic Studies/Procedures   CORONARY STENT INTERVENTION  Conclusion     Prox RCA to Mid RCA lesion, 40 %stenosed.  RPDA lesion, 60 %stenosed.  Ost 1st Mrg to 1st Mrg lesion, 90 %stenosed.  Mid Cx lesion, 90 %stenosed.  Ost Cx to Prox Cx lesion, 30 %stenosed.  Ost Cx lesion, 99 %stenosed.  Mid LAD to Dist LAD lesion, 10 %stenosed.  Mid LAD lesion, 80 %stenosed.  1st Diag lesion, 70 %stenosed.  Acute Mrg lesion, 70 %stenosed.  SVG and is normal in caliber.  SVG.  Origin lesion, 100 %stenosed.  SVG.  Origin lesion, 100 %stenosed.  Dist LAD lesion, 50 %stenosed.  LIMA and is normal in caliber and anatomically normal.  Prox RCA lesion, 60 %stenosed.  A drug eluting stent was successfully placed.  Mid RCA lesion, 95 %stenosed.  Post intervention, there is a 0% residual stenosis.  Dist RCA lesion, 55 %stenosed.  Successful complex angioplasty and drug-eluting stent placement to the mid right coronary artery. Very difficult procedure due to calcified vessel with difficulty advancing equipment. The RV branch was jailed by the stent but had TIMI-3 flow.  Recommendations: Dual antiplatelet therapy for at least 6 months. Aggressive treatment of risk factors.   Diagnostic Diagram       Post-Intervention Diagram           History of Present Illness     61 y.o. male with hx of CAD s/p CABG and multiple stents, HLD, PAF, DM, HTN, and CKD presented for scheduled complex PCI.   Recently seen by Dr. Rockey Situ with symptoms concerning for [place]. Followed up cath 11/24/16 by Dr. Fletcher Anon as below:  1.  Significant underlying three-vessel coronary artery disease with patent LIMA to LAD and patent SVG to OM1. Occluded SVG to OM 2 and SVG to right PDA. 2. Low normal LV systolic function with an EF of 50% with normal left ventricular end-diastolic pressure. 3. Unsuccessful attempted angioplasty of the right coronary artery due to inability to advance the wire into the distal right coronary artery as the result of tortuosity and a bifurcation with a large RV marginal.  Recommendations: Continue medical therapy. I will discuss with my colleagues for a second opinion and reattempted angioplasty.  Hospital Course     Consultants: None  1. CAD  - Cath with patent LIMA to LAD and patent SVG to OM1. Occluded SVG to OM 2 and SVG to right PDA. S/p Successful complex angioplasty and drug-eluting stent placement to the mid right coronary artery. Very difficult procedure due to calcified vessel with difficulty advancing equipment. The RV branch was jailed by the stent but had TIMI-3 flow. - Continue DAPA, statin, BB and ACE. Ambulated well.   2. HLD - No results found for requested labs within last 8760 hours.  - Continue Simvastatin. He has not been able to take Crestor or Lipitor because of elevated LFTs on these agents.  He will bring lipid profile during clinic visit. If his LDL cholesterol is above 70 it might be reasonable to consider Repatha or Praluent per Dr. Rockey Situ.   3. HTN - Stable on current medication  4. DM - Resume home meds.Last A1c 8.7. Follow up with PCP.   The patient has been seen by Dr. Burt Knack today and deemed ready for discharge home. All follow-up appointments have been scheduled. Discharge medications are listed below.  _____________   Discharge Vitals Blood pressure 137/81, pulse 72, temperature 97.8 F (36.6 C), temperature source Oral, resp. rate 13, height 5\' 8"  (1.727 m), weight 207 lb (93.9 kg), SpO2 99 %.  Filed Weights   11/29/16 1004  Weight: 207 lb  (93.9 kg)    Labs & Radiologic Studies     CBC  Recent Labs  11/29/16 1004 11/30/16 0458  WBC 5.1 5.5  HGB 16.8 15.6  HCT 47.1 44.7  MCV 90.4 91.2  PLT 125* 338*   Basic Metabolic Panel  Recent Labs  11/29/16 1004 11/30/16 0458  NA 135 137  K 4.4 4.5  CL 101 102  CO2 23 27  GLUCOSE 252* 211*  BUN 16 11  CREATININE 0.82 0.82  CALCIUM 9.1 9.0     Dg Chest 2 View  Result Date: 11/23/2016 CLINICAL DATA:  Chest pain. Preprocedural cardiac catheterization. Hypertension. EXAM: CHEST  2 VIEW COMPARISON:  February 19, 2015 FINDINGS: There is no edema or consolidation. Heart size and pulmonary vascularity are normal. No adenopathy. Patient is status post coronary artery bypass grafting. There are foci of native coronary artery calcification. There is degenerative change in the thoracic spine. IMPRESSION: No edema or consolidation. Electronically Signed   By: Lowella Grip III M.D.   On: 11/23/2016 14:16   Nm Myocar Multi W/spect W/wall Motion / Ef  Result Date: 11/03/2016 Exercise myocardial perfusion imaging study with ischemia in the basal to mid inferolateral wall Normal wall motion, EF estimated at 48% Equivocal ST abnormality at peak stress Baseline T wave abnormality in anterolateral leads. Target heart rate achieved. Achieved 9.7 Mets High risk scan Signed, Esmond Plants, MD, Ph.D [facility]    Disposition   Pt is being discharged home today in good condition.  Follow-up Plans & Appointments    Follow-up Information    Minna Merritts, MD. Go on 12/12/2016.   Specialty:  Cardiology Why:  @2 :20 post cath  Contact information: [place] [address] [phone]        Kelton Pillar, MD. Schedule an appointment as soon as possible for a visit in 5 day(s).   Specialty:  Family Medicine Why:  for DM managment  Contact information: [address] [place] [address] [phone]          Discharge  Instructions    AMB Referral to Cardiac Rehabilitation - Phase II    Complete by:  As directed    Diagnosis:   Coronary Stents Stable Angina     Amb Referral to Cardiac Rehabilitation    Complete by:  As directed    Diagnosis:  Coronary Stents   Diet - low sodium heart healthy    Complete by:  As directed    Discharge instructions    Complete by:  As directed    No driving for 48 hours. No lifting over 5 lbs for 1 week. No sexual activity for 1 week. You may return to work on 12/04/16. Keep procedure site clean & dry. If you notice increased pain, swelling, bleeding or pus, call/return!  You may shower, but no soaking baths/hot tubs/pools for 1 week.   Hold metformin today. Resume tomorrow.   Increase activity slowly  Complete by:  As directed       Discharge Medications   Current Discharge Medication List    START taking these medications   Details  Continuous Blood Gluc Sensor MISC 1 each by Does not apply route as directed. Use as directed every 10 days. May dispense FreeStyle [employer] or similar. Qty: 3 each, Refills: 0      CONTINUE these medications which have NOT CHANGED   Details  aspirin EC 81 MG tablet Take 81 mg by mouth daily.    clopidogrel (PLAVIX) 75 MG tablet TAKE 1 TABLET BY MOUTH DAILY Qty: 90 tablet, Refills: 3    Coenzyme Q10 (COQ10) 100 MG CAPS Take 100 mg by mouth daily.    Insulin Glargine (LANTUS) 100 UNIT/ML Solostar Pen Inject 24 Units into the skin daily at 10 pm. Qty: 15 mL, Refills: 11    levothyroxine (SYNTHROID) 100 MCG tablet Take 1 tablet (100 mcg total) by mouth daily before breakfast. Qty: 90 tablet, Refills: 0   Associated Diagnoses: Hypothyroidism, unspecified hypothyroidism type    metoprolol tartrate (LOPRESSOR) 100 MG tablet Take 1 tablet (100 mg total) by mouth 2 (two) times daily. Qty: 60 tablet, Refills: 1    Multiple Vitamin (MULTIVITAMIN WITH MINERALS) TABS tablet Take 1 tablet by mouth 2 (two) times daily.      Omega-3 Fatty Acids (FISH OIL PO) Take 900 mg by mouth daily.     ramipril (ALTACE) 10 MG capsule Take 1 capsule (10 mg total) by mouth daily. Qty: 30 capsule, Refills: 1    simvastatin (ZOCOR) 40 MG tablet TAKE 1 TABLET BY MOUTH DAILY Qty: 90 tablet, Refills: 3    metFORMIN (GLUCOPHAGE-XR) 500 MG 24 hr tablet Take 1 tablet (500 mg total) by mouth 2 (two) times daily with a meal. Qty: 60 tablet, Refills: 1    nitroGLYCERIN (NITROSTAT) 0.4 MG SL tablet Place 0.4 mg under the tongue every 5 (five) minutes as needed for chest pain.          Outstanding Labs/Studies   None  Duration of Discharge Encounter   Greater than 30 minutes including physician time.  Signed, Haleem Hanner PA-C 11/30/2016, 10:44 AM

## 2016-12-12 ENCOUNTER — Ambulatory Visit (INDEPENDENT_AMBULATORY_CARE_PROVIDER_SITE_OTHER): Payer: BLUE CROSS/BLUE SHIELD | Admitting: Cardiovascular Disease

## 2016-12-12 ENCOUNTER — Encounter: Payer: Self-pay | Admitting: Cardiovascular Disease

## 2016-12-12 VITALS — BP 126/64 | HR 75 | Ht 68.0 in | Wt 209.5 lb

## 2016-12-12 DIAGNOSIS — I25118 Atherosclerotic heart disease of native coronary artery with other forms of angina pectoris: Secondary | ICD-10-CM

## 2016-12-12 DIAGNOSIS — I200 Unstable angina: Secondary | ICD-10-CM | POA: Diagnosis not present

## 2016-12-12 DIAGNOSIS — I208 Other forms of angina pectoris: Secondary | ICD-10-CM

## 2016-12-12 DIAGNOSIS — I10 Essential (primary) hypertension: Secondary | ICD-10-CM | POA: Diagnosis not present

## 2016-12-12 DIAGNOSIS — I480 Paroxysmal atrial fibrillation: Secondary | ICD-10-CM | POA: Diagnosis not present

## 2016-12-12 DIAGNOSIS — E1159 Type 2 diabetes mellitus with other circulatory complications: Secondary | ICD-10-CM

## 2016-12-12 DIAGNOSIS — Z955 Presence of coronary angioplasty implant and graft: Secondary | ICD-10-CM

## 2016-12-12 DIAGNOSIS — E782 Mixed hyperlipidemia: Secondary | ICD-10-CM | POA: Diagnosis not present

## 2016-12-12 DIAGNOSIS — Z951 Presence of aortocoronary bypass graft: Secondary | ICD-10-CM | POA: Diagnosis not present

## 2016-12-12 NOTE — Progress Notes (Signed)
Cardiology Office Note  Date:  12/12/2016   ID:  Juan, [name] 23-Mar-1955, MRN [id number]  PCP:  Kelton Pillar, MD   Chief Complaint  Patient presents with  . other    Post cardiac cath. Meds reviewed verbally with pt.    HPI:  Juan [name] is a very pleasant 61 year old male with a history of   coronary artery disease,  multiple cardiac catheterizations with intervention dating back to 1994,  total of 8 stents,  diabetes  hyperlipidemia,  Paroxysmal atrial fibrillation, post op CABG  presented to [facility] [place] with angina, Cardiac catheterization showing three-vessel disease, referred to [facility] for CABG. 01/29/15 He presents today for follow-up of his coronary artery disease, CABG  Unstable angina on his last clinic visit Cardiac catheterization November 24, 2016 1.  Significant underlying three-vessel coronary artery disease with patent LIMA to LAD and patent SVG to OM1.  Occluded SVG to OM 2 and SVG to right PDA. 2.  Low normal LV systolic function with an EF of 50% with normal left ventricular end-diastolic pressure. 3.  Unsuccessful attempted angioplasty of the right coronary artery due to inability to advance the wire into the distal right coronary artery as the result of tortuosity and a bifurcation with a large RV marginal.  Reattempt at intervention of the RCA November 29, 2016 in [place] Successful complex angioplasty and drug-eluting stent placement to the mid right coronary artery. Very difficult procedure due to calcified vessel with difficulty advancing equipment.  The RV branch was jailed by the stent but had TIMI-3 flow.  Results of 2 cardiac catheterizations above discussed with him  Discussed other lab work with him Hemoglobin A1c 8.7 Using larger amounts of insulin  Does not feel that he needs cardiac rehab  Other stressors including wedding coming up in 2 or 3 weeks, daughter Has not been taking nitroglycerin Previously was exercising for long  periods of time, now has to cut back on his exercise dramatically  Ab work reviewed with him in detail Hemoglobin 8.7, total cholesterol 165 which is up from 133 last year  EKG personally reviewed by myself on todays visit Shows normal sinus rhythm rate 75 bpm T-wave abnormality anterolateral leads, seen on previous EKGs  Other past medical history reviewed No smoking history. Reports his father also had coronary artery disease in his 31s.  Coronary artery bypass grafting x4 (left internal mammary artery to LAD, saphenous vein graft to posterior descending, saphenous vein graft to OM1, saphenous vein graft to OM2) withEndoscopic harvest of bilateral leg greater saphenous vein by Dr. Prescott Gum on 01/29/2015.  cardiac catheterization 01/21/2015 showed  Prox RCA to Mid RCA lesion, 70% stenosed. The lesion was previously treated with a stent (unknown type), Prox RCA lesion, 70% stenosed.  Mid RCA lesion, 90% stenosed.  Mid Cx lesion, 90% stenosed, Ost Cx lesion, 90% stenosed.Ost 1st Mrg to 1st Mrg lesion, 90% stenosed.  Mid LAD lesion, 80% stenosed, 1st Diag lesion, 70% stenosed.  1. Severe diffuse calcified three-vessel coronary artery disease with previous multivessel stenting. 2. Normal LV systolic function and mildly elevated left ventricular end-diastolic pressure.  postoperatively he had brief period of atrial tachycardia/atrial fibrillation, started on amiodarone, Converted to normal sinus rhythm.  PMH:   has a past medical history of Childhood asthma, Chronic kidney disease, Coronary artery disease, Essential hypertension, GERD (gastroesophageal reflux disease), Heart murmur, History of kidney stones, Hyperlipidemia, Hypothyroidism, Mild sleep apnea, Myocardial infarction ([place]) (1996), and Type II diabetes mellitus ([place]).  PSH:  Past Surgical History:  Procedure Laterality Date  . [place];  Dr.Harold; [facility].  . CARDIAC CATHETERIZATION  2002   "total of 8 stents by now" (11/29/2016)  . CARDIAC CATHETERIZATION  11/24/2016   "couldn't get stent in this time" (11/29/2016)  . [place]; Dr. Joneen Boers; [facility]  . [place]   ""  . CORONARY ANGIOPLASTY WITH STENT PLACEMENT     ''''  . CORONARY ANGIOPLASTY WITH STENT PLACEMENT     '''  . CORONARY ANGIOPLASTY WITH STENT PLACEMENT     '''  . CORONARY ANGIOPLASTY WITH STENT PLACEMENT  2006   "2 stents for a total of 10 stents"; [facility]; [place]   . CORONARY ANGIOPLASTY WITH STENT PLACEMENT  11/29/2016   "1 stent"  . GANGLION CYST EXCISION Right   . NASAL SEPTUM SURGERY    . THYROIDECTOMY      Current Outpatient Medications  Medication Sig Dispense Refill  . aspirin EC 81 MG tablet Take 81 mg by mouth daily.    . clopidogrel (PLAVIX) 75 MG tablet TAKE 1 TABLET BY MOUTH DAILY 90 tablet 3  . Coenzyme Q10 (COQ10) 100 MG CAPS Take 100 mg by mouth daily.    . Continuous Blood Gluc Sensor MISC 1 each by Does not apply route as directed. Use as directed every 10 days. May dispense FreeStyle [employer] or similar. 3 each 0  . Insulin Glargine (LANTUS) 100 UNIT/ML Solostar Pen Inject 24 Units into the skin daily at 10 pm. (Patient taking differently: Inject 52 Units into the skin daily at 10 pm. ) 15 mL 11  . levothyroxine (SYNTHROID) 100 MCG tablet Take 1 tablet (100 mcg total) by mouth daily before breakfast. 90 tablet 0  . metFORMIN (GLUCOPHAGE-XR) 500 MG 24 hr tablet Take 1 tablet (500 mg total) by mouth 2 (two) times daily with a meal. (Patient taking differently: Take 1,000 mg by mouth 2 (two) times daily with a meal. ) 60 tablet 1  . metoprolol tartrate (LOPRESSOR) 100 MG tablet Take 1 tablet (100 mg total) by mouth 2 (two) times daily. 60 tablet 1  . Multiple Vitamin (MULTIVITAMIN WITH MINERALS) TABS tablet Take 1  tablet by mouth 2 (two) times daily.    . nitroGLYCERIN (NITROSTAT) 0.4 MG SL tablet Place 0.4 mg under the tongue every 5 (five) minutes as needed for chest pain.    . Omega-3 Fatty Acids (FISH OIL PO) Take 900 mg by mouth daily.     . ramipril (ALTACE) 10 MG capsule Take 1 capsule (10 mg total) by mouth daily. 30 capsule 1  . simvastatin (ZOCOR) 40 MG tablet TAKE 1 TABLET BY MOUTH DAILY (Patient taking differently: TAKE 1 TABLET BY MOUTH DAILY IN THE EVENING.) 90 tablet 3   No current facility-administered medications for this visit.      Allergies:   Patient has no known allergies.   Social History:  The patient  reports that  has never smoked. he has never used smokeless tobacco. He reports that he drinks about 1.8 oz of alcohol per week. He reports that he does not use drugs.   Family History:   family history includes Cancer (age of onset: 22) in his father; Cirrhosis in his sister; Drug abuse in his sister; Heart attack in his father and mother.    Review of Systems: Review of Systems  Constitutional:  Negative.   Respiratory: Negative.   Cardiovascular: Negative.   Gastrointestinal: Negative.   Musculoskeletal: Negative.   Neurological: Negative.   Psychiatric/Behavioral: Negative.   All other systems reviewed and are negative.    PHYSICAL EXAM: VS:  BP 126/64 (BP Location: Left Arm, Patient Position: Sitting, Cuff Size: Normal)   Pulse 75   Ht 5\' 8"  (1.727 m)   Wt 209 lb 8 oz (95 kg)   BMI 31.85 kg/m  , BMI Body mass index is 31.85 kg/m. GEN: Well nourished, well developed, in no acute distress  HEENT: normal  Neck: no JVD, carotid bruits, or masses Cardiac: RRR; no murmurs, rubs, or gallops,no edema  Respiratory:  clear to auscultation bilaterally, normal work of breathing GI: soft, nontender, nondistended, + BS MS: no deformity or atrophy  Skin: warm and dry, no rash Neuro:  Strength and sensation are intact Psych: euthymic mood, full affect     Recent  Labs: 11/30/2016: BUN 11; Creatinine, Ser 0.82; Hemoglobin 15.6; Platelets 112; Potassium 4.5; Sodium 137    Lipid Panel Lab Results  Component Value Date   CHOL 197 02/16/2014   HDL 34.10 (L) 02/16/2014   LDLCALC 61 04/11/2013   TRIG 402.0 (H) 02/16/2014      Wt Readings from Last 3 Encounters:  12/12/16 209 lb 8 oz (95 kg)  11/29/16 207 lb (93.9 kg)  11/24/16 203 lb (92.1 kg)       ASSESSMENT AND PLAN:  Coronary artery disease of native artery of native heart with stable angina pectoris (HCC) Recent cardiac catheterization x2 discussed with him in detail Stressed importance of aggressive diabetes control We will wait for his new lipid panel with primary care later this week For numbers above goal recommend we consider Repatha/praluent  Mixed hyperlipidemia As above, recommended more aggressive cholesterol management We will wait for lab work from primary care  Essential hypertension Blood pressure is well controlled on today's visit. No changes made to the medications.  Paroxysmal atrial fibrillation (HCC)  postoperatively he had brief period of atrial tachycardia/atrial fibrillation, started on amiodarone,  Maintaining normal sinus rhythm on today's visit  S/P coronary artery stent placement PTCA with stent to the RCA Details discussed with him, aspirin Plavix  Uncontrolled type 2 diabetes mellitus with complication, with long-term current use of insulin ([place]) Stressed importance of weight loss and low carbohydrate diet, exercise program Also working with endocrinology  S/P CABG (coronary artery bypass graft) Recent catheterizations discussed with him   Total encounter time more than 45 minutes  Greater than 50% was spent in counseling and coordination of care with the patient   Disposition:   F/U  6 months   Orders Placed This Encounter  Procedures  . EKG 12-Lead     Signed, Esmond Plants, M.D., Ph.D. 12/12/2016  [place],  [place]

## 2016-12-12 NOTE — Patient Instructions (Signed)
Goal LDL <70 at least, Preferably <50   Medication Instructions:   No medication changes made  Labwork:  No new labs needed  Testing/Procedures:  No further testing at this time   Follow-Up: It was a pleasure seeing you in the office today. Please call us if you have new issues that need to be addressed before your next appt.  [phone]  Your physician wants you to follow-up in: 6 months.  You will receive a reminder letter in the mail two months in advance. If you don't receive a letter, please call our office to schedule the follow-up appointment.  If you need a refill on your cardiac medications before your next appointment, please call your pharmacy.

## 2016-12-27 IMAGING — CR DG CHEST 1V PORT
1 series · 1 of 1 positions shown · non-contrast
Comparison: 01/27/2015

CLINICAL DATA: Status post coronary bypass graft

EXAM:
PORTABLE CHEST - 1 VIEW

[AP]
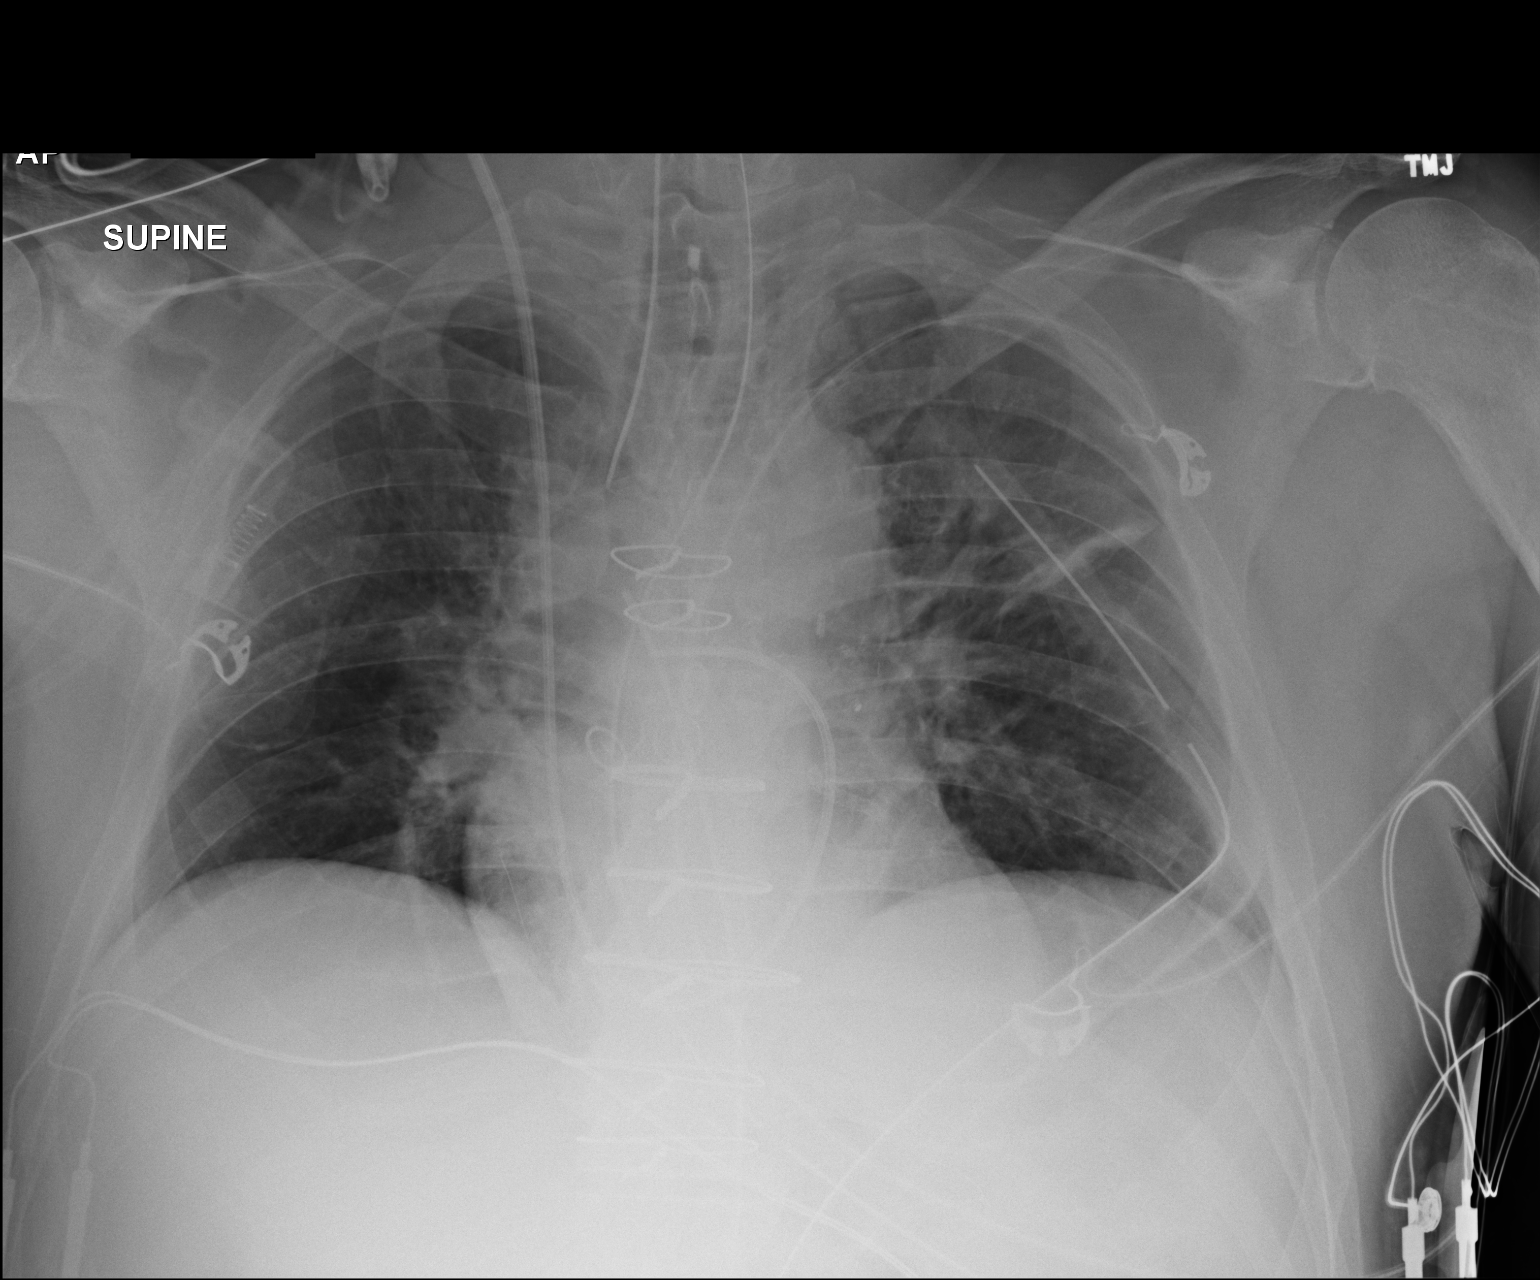

[1 of 1 positions shown; findings below may reference images not displayed]

FINDINGS: Cardiac shadow is within normal limits. Postsurgical changes are now
seen. A Swan-Ganz catheter is noted the right pulmonary artery. The
endotracheal tube is noted 3.8 cm above the carina. Nasogastric
catheter extends into the stomach. Mediastinal drain and left
thoracostomy catheter are seen. No pneumothorax or focal infiltrate
is noted. Minimal platelike atelectasis is noted in the left mid
lung.
IMPRESSION: Tubes and lines as described above.

Postsurgical changes.

## 2016-12-29 ENCOUNTER — Telehealth: Payer: Self-pay | Admitting: *Deleted

## 2016-12-30 IMAGING — CR DG CHEST 2V
2 series · 2 of 2 positions shown · non-contrast
Comparison: 01/31/2015.

CLINICAL DATA: Postop shortness of breath.

EXAM:
CHEST  2 VIEW

[chest pa]
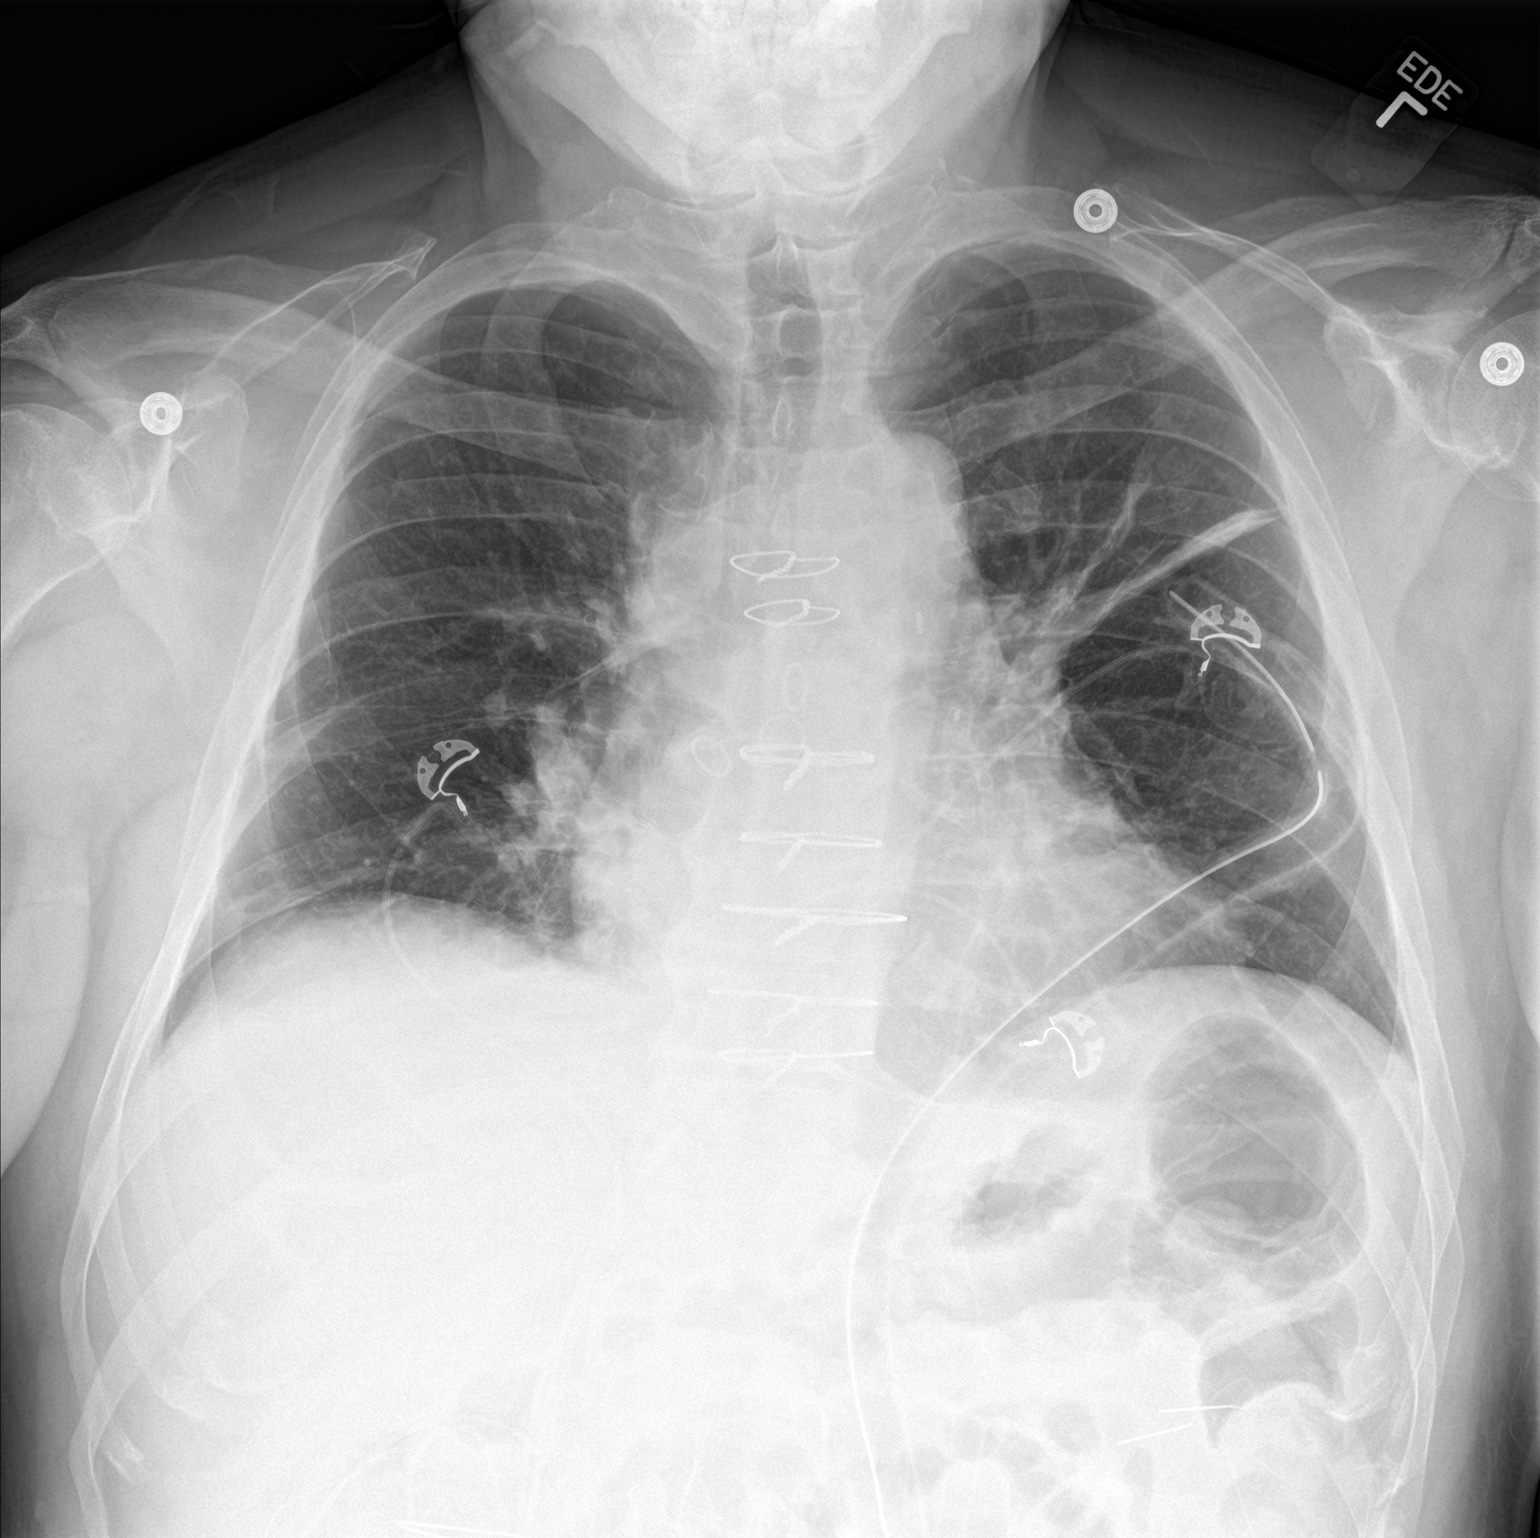

[chest lat]
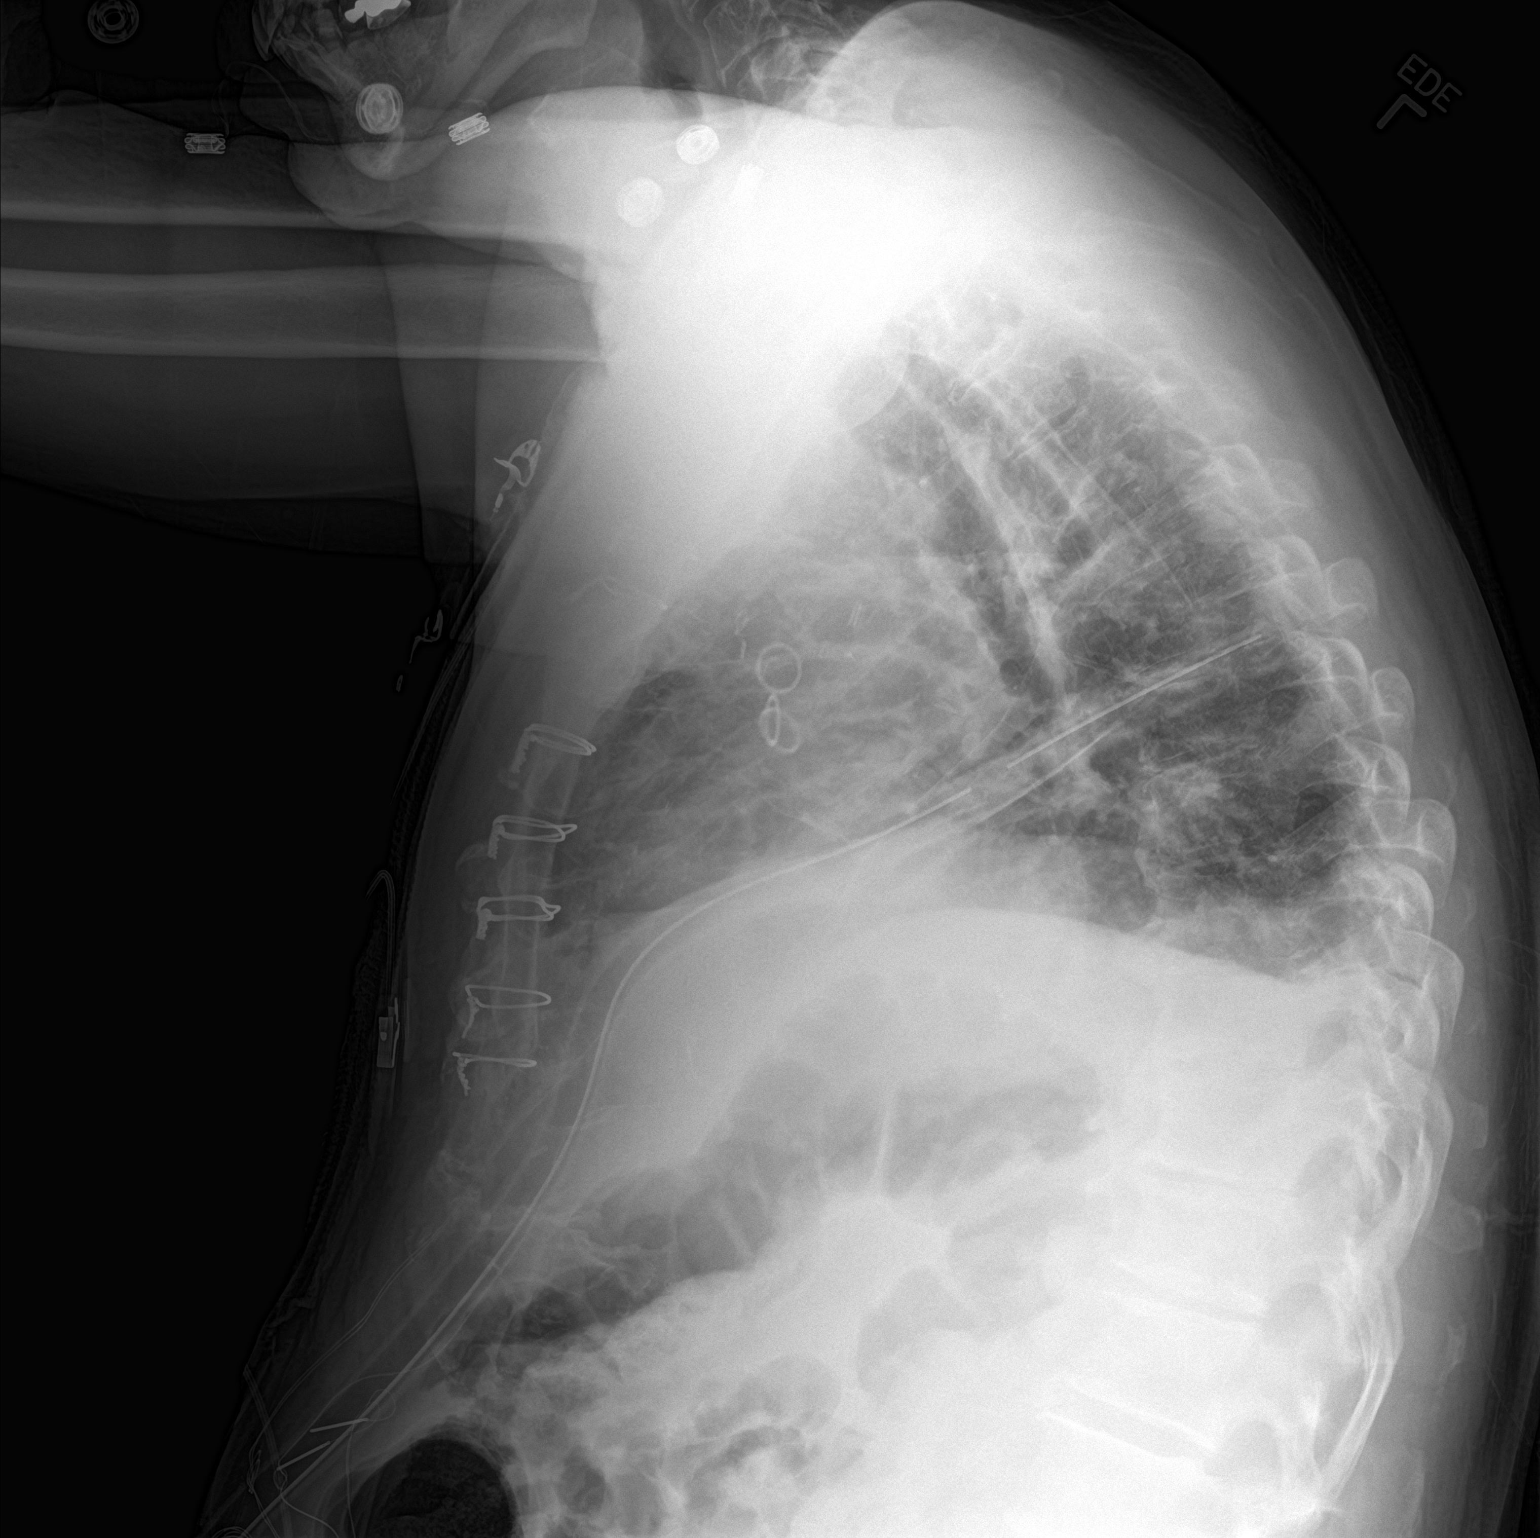

[2 of 2 positions shown; findings below may reference images not displayed]

FINDINGS: Right IJ sheath is been removed in the interval. Left chest tube
remains in place without evidence for left-sided pneumothorax. Tiny
left pleural effusion suspected. Subsegmental atelectasis is evident
bilaterally. Cardiopericardial silhouette remains enlarged. The
right para midline mediastinal/pericardial drain has been removed.
Telemetry leads overlie the chest. Bones are diffusely
demineralized.
IMPRESSION: Interval removal of right IJ sheath and midline drain.

Subsegmental atelectasis bilaterally.

## 2016-12-31 IMAGING — DX DG CHEST 2V
2 series · 2 of 2 positions shown · non-contrast
Comparison: 02/01/2015.

CLINICAL DATA: CABG.

EXAM:
CHEST  2 VIEW

[chest pa]
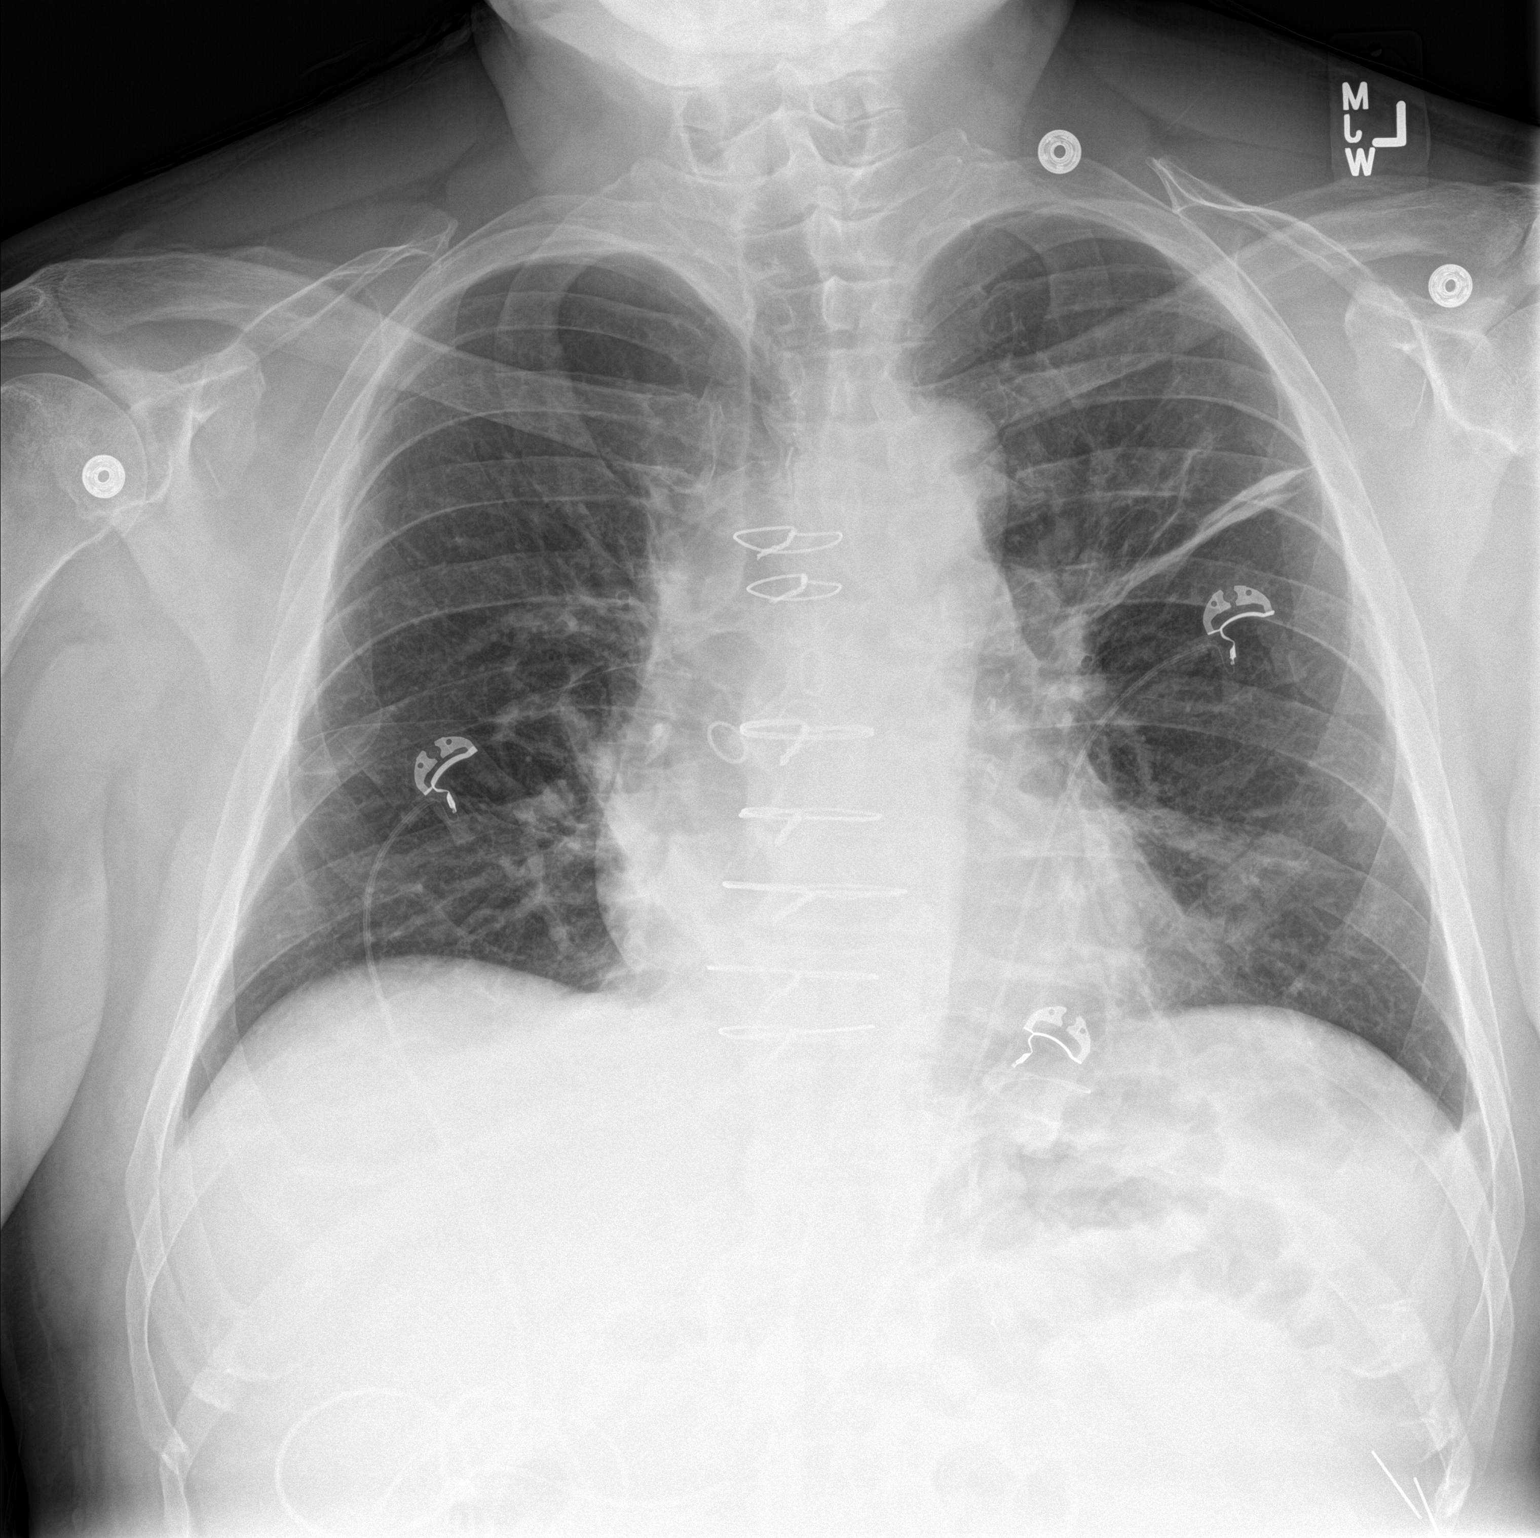

[chest lat]
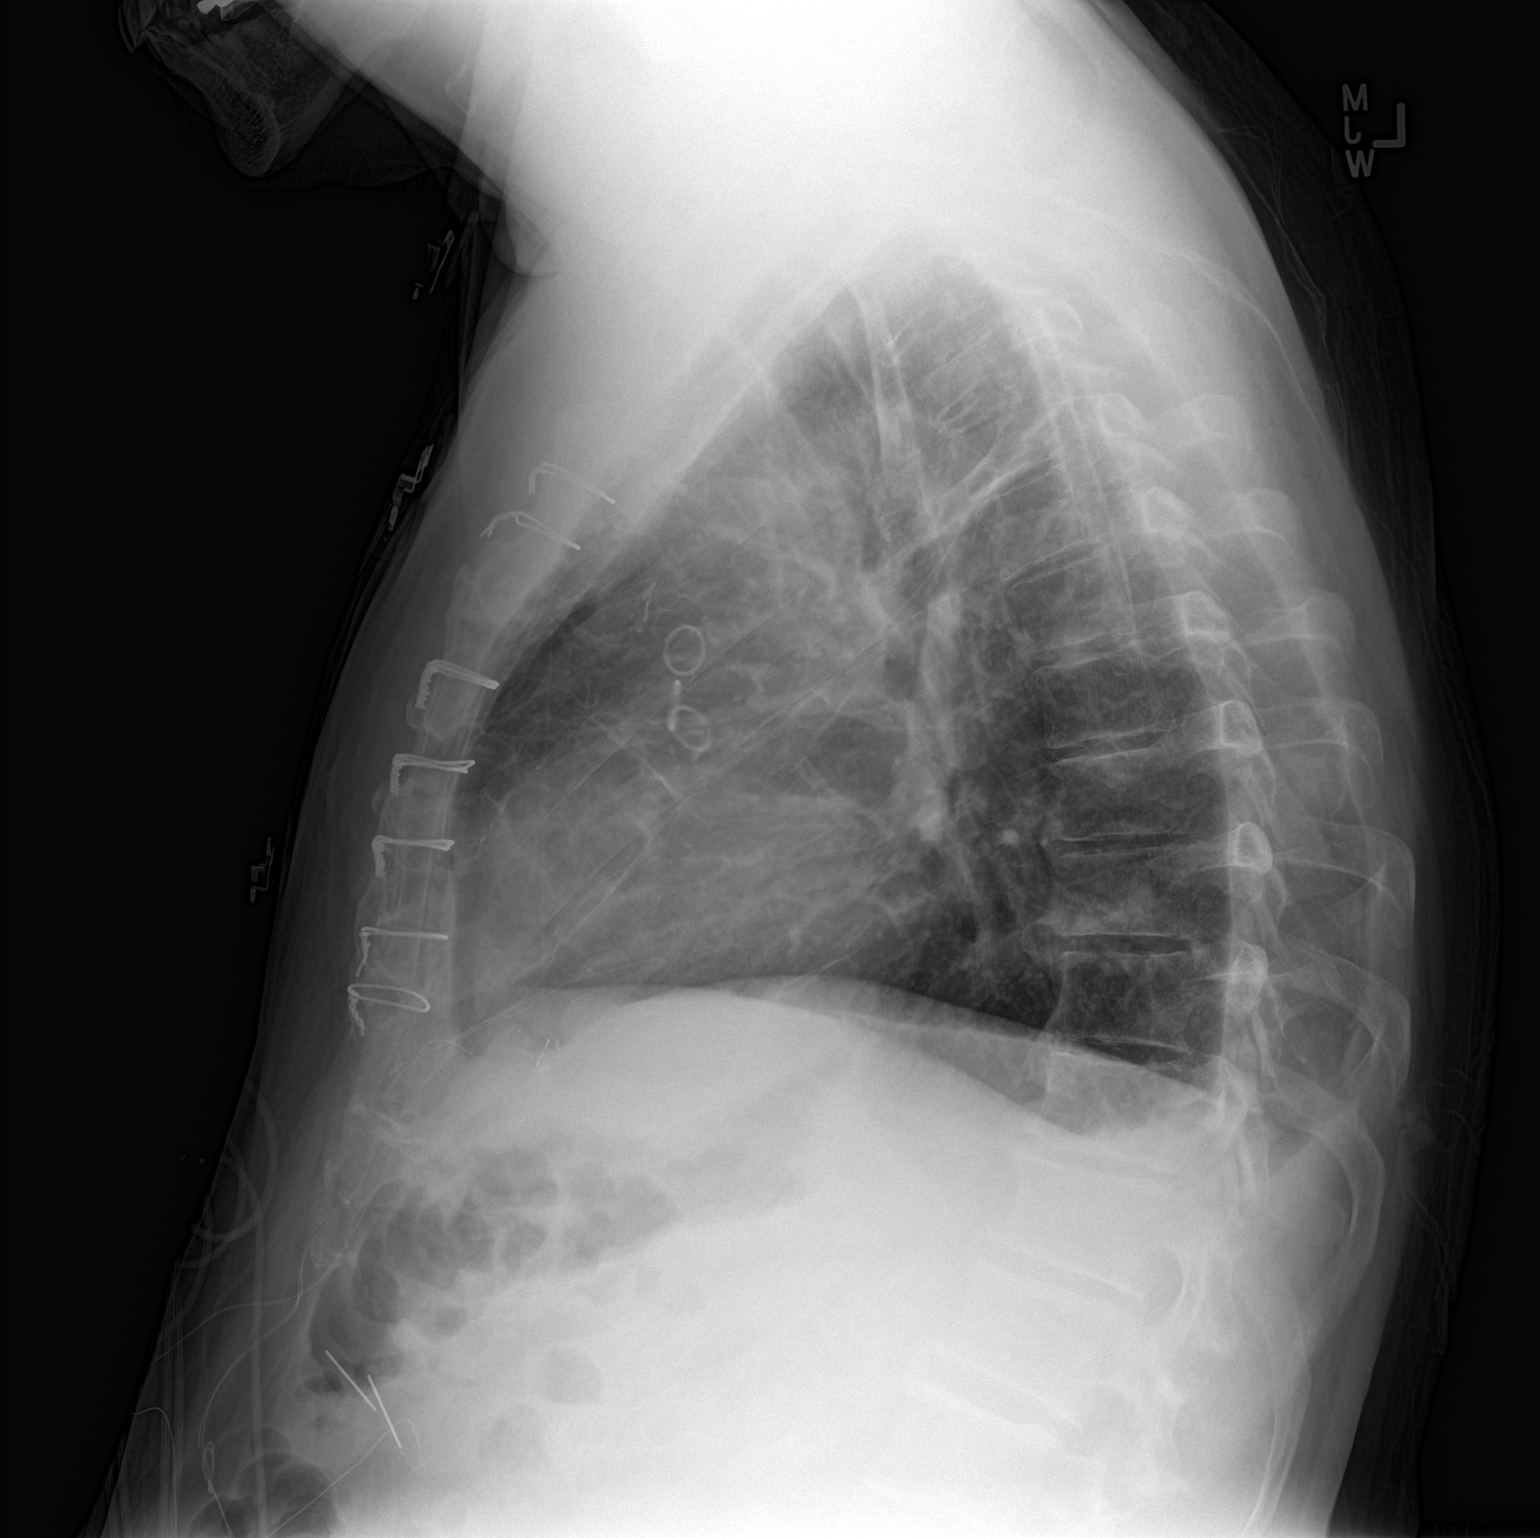

[2 of 2 positions shown; findings below may reference images not displayed]

FINDINGS: Interim removal of left chest tube. No pneumothorax. Mediastinum
hilar structures are stable. Prior CABG. Stable cardiomegaly. No
pulmonary venous congestion. Persistent bilateral platelike
atelectasis again noted. Tiny left pleural effusion cannot be
excluded.
IMPRESSION: 1. Interim removal of left chest tube.  No pneumothorax.

2. Bilateral platelike atelectasis again noted, no interim change.
Small left pleural effusion cannot be excluded .

3.  Prior CABG.  Heart size stable.  No pulmonary venous congestion.

## 2017-01-02 ENCOUNTER — Telehealth: Payer: Self-pay

## 2017-01-19 ENCOUNTER — Telehealth: Payer: Self-pay | Admitting: Cardiovascular Disease

## 2017-01-19 NOTE — Telephone Encounter (Signed)
Received notes from [place]  placed in [place] box

## 2017-01-31 ENCOUNTER — Ambulatory Visit (INDEPENDENT_AMBULATORY_CARE_PROVIDER_SITE_OTHER): Payer: 59 | Admitting: Cardiovascular Disease

## 2017-01-31 ENCOUNTER — Encounter: Payer: Self-pay | Admitting: Cardiovascular Disease

## 2017-01-31 VITALS — BP 112/64 | HR 70 | Ht 68.0 in | Wt 214.2 lb

## 2017-01-31 DIAGNOSIS — I10 Essential (primary) hypertension: Secondary | ICD-10-CM

## 2017-01-31 DIAGNOSIS — E1159 Type 2 diabetes mellitus with other circulatory complications: Secondary | ICD-10-CM | POA: Diagnosis not present

## 2017-01-31 DIAGNOSIS — E782 Mixed hyperlipidemia: Secondary | ICD-10-CM

## 2017-01-31 DIAGNOSIS — I480 Paroxysmal atrial fibrillation: Secondary | ICD-10-CM | POA: Diagnosis not present

## 2017-01-31 DIAGNOSIS — Z951 Presence of aortocoronary bypass graft: Secondary | ICD-10-CM

## 2017-01-31 DIAGNOSIS — Z955 Presence of coronary angioplasty implant and graft: Secondary | ICD-10-CM

## 2017-01-31 DIAGNOSIS — I25118 Atherosclerotic heart disease of native coronary artery with other forms of angina pectoris: Secondary | ICD-10-CM | POA: Diagnosis not present

## 2017-01-31 DIAGNOSIS — I208 Other forms of angina pectoris: Secondary | ICD-10-CM | POA: Diagnosis not present

## 2017-01-31 NOTE — Patient Instructions (Signed)

## 2017-01-31 NOTE — Progress Notes (Signed)
Cardiology Office Note  Date:  01/31/2017   ID:  Juan [name], Juan [name] [birth year], MRN [id number]  PCP:  Juan Pillar, MD   Chief Complaint  Patient presents with  . OTHER    3 month f/u no complaints today. Meds reviewed verbally with pt.    HPI:  [name] is a very pleasant 62 year old male with a history of  coronary artery disease,  multiple cardiac catheterizations with intervention dating back to 1994,  total of 8 stents,  Diabetes, hemoglobin A1c greater than 8  hyperlipidemia, difficult time tolerating numerous statins including Zetia (Tolerating simvastatin 40) Paroxysmal atrial fibrillation, post op CABG  presented to [facility] with angina, Cardiac catheterization showing three-vessel disease, referred to [facility] for CABG. 01/29/15 He presents today for follow-up of his coronary artery disease, CABG  Unstable angina on his last clinic visit Cardiac catheterization November 24, 2016 1.  Significant underlying three-vessel coronary artery disease with patent LIMA to LAD and patent SVG to OM1.  Occluded SVG to OM 2 and SVG to right PDA. 2.  Low normal LV systolic function with an EF of 50% with normal left ventricular end-diastolic pressure. 3.  Unsuccessful attempted angioplasty of the right coronary artery due to inability to advance the wire into the distal right coronary artery as the result of tortuosity and a bifurcation with a large RV marginal.  Reattempt at intervention of the RCA November 29, 2016 in [place] Successful complex angioplasty and drug-eluting stent placement to the mid right coronary artery. Very difficult procedure due to calcified vessel with difficulty advancing equipment.  The RV branch was jailed by the stent but had TIMI-3 flow.  Discussed other lab work with him Hemoglobin A1c 8.7 Using larger amounts of insulin  Total regular exercise program Denies any anginal symptoms Feels stent to the RCA was effective for his previous chest  pain symptoms  Hemoglobin 8.7, total cholesterol 160,   EKG personally reviewed by myself on todays visit Shows normal sinus rhythm rate 70 bpm T-wave abnormality anterolateral leads, seen on previous EKGs  Other past medical history reviewed No smoking history.   PMH:   has a past medical history of Childhood asthma, Chronic kidney disease, Coronary artery disease, Essential hypertension, GERD (gastroesophageal reflux disease), Heart murmur, History of kidney stones, Hyperlipidemia, Hypothyroidism, Mild sleep apnea, Myocardial infarction ([place]) (1996), and Type II diabetes mellitus ([place]).  PSH:    Past Surgical History:  Procedure Laterality Date  . [place]; Dr.Harold; [facility].  . CARDIAC CATHETERIZATION  2002   "total of 8 stents by now" (11/29/2016)  . CARDIAC CATHETERIZATION N/A 01/21/2015   Procedure: Left Heart Cath and Coronary Angiography;  Surgeon: Wellington Hampshire, MD;  Location: [place] CV [facility];  Service: Cardiovascular;  Laterality: N/A;  . CARDIAC CATHETERIZATION  11/24/2016   "couldn't get stent in this time" (11/29/2016)  . [place]; Dr. Joneen Boers; [employer] [facility]  . [place]   ""  . CORONARY ANGIOPLASTY WITH STENT PLACEMENT     ''''  . CORONARY ANGIOPLASTY WITH STENT PLACEMENT     '''  . CORONARY ANGIOPLASTY WITH STENT PLACEMENT     '''  . CORONARY ANGIOPLASTY WITH STENT PLACEMENT  2006   "2 stents for a total of 10 stents"; [facility]; [place]   . CORONARY ANGIOPLASTY WITH STENT PLACEMENT  11/29/2016   "1 stent"  . CORONARY ARTERY BYPASS  GRAFT N/A 01/29/2015   Procedure: CORONARY ARTERY BYPASS GRAFTING (CABG)x four using left internal mammary artery artery and bilateral saphenous thigh vein using endoscope.;  Surgeon: Ivin Poot, MD;  Location: [place];  Service: Open Heart Surgery;  Laterality: N/A;  . CORONARY STENT  INTERVENTION N/A 11/24/2016   Procedure: CORONARY STENT INTERVENTION;  Surgeon: Wellington Hampshire, MD;  Location: [place] [facility];  Service: Cardiovascular;  Laterality: N/A;  . CORONARY STENT INTERVENTION N/A 11/29/2016   Procedure: CORONARY STENT INTERVENTION;  Surgeon: Wellington Hampshire, MD;  Location: [place] [facility];  Service: Cardiovascular;  Laterality: N/A;  . GANGLION CYST EXCISION Right   . LEFT HEART CATH AND CORONARY ANGIOGRAPHY Left 11/24/2016   Procedure: LEFT HEART CATH AND CORONARY ANGIOGRAPHY;  Surgeon: Wellington Hampshire, MD;  Location: [place] [facility];  Service: Cardiovascular;  Laterality: Left;  . NASAL SEPTUM SURGERY    . TEE WITHOUT CARDIOVERSION N/A 01/29/2015   Procedure: TRANSESOPHAGEAL ECHOCARDIOGRAM (TEE);  Surgeon: Ivin Poot, MD;  Location: [place];  Service: Open Heart Surgery;  Laterality: N/A;  . THYROIDECTOMY      Current Outpatient Medications  Medication Sig Dispense Refill  . aspirin EC 81 MG tablet Take 81 mg by mouth daily.    . clopidogrel (PLAVIX) 75 MG tablet TAKE 1 TABLET BY MOUTH DAILY 90 tablet 3  . Coenzyme Q10 (COQ10) 100 MG CAPS Take 100 mg by mouth daily.    . Continuous Blood Gluc Sensor MISC 1 each by Does not apply route as directed. Use as directed every 10 days. May dispense FreeStyle [employer] or similar. 3 each 0  . Insulin Glargine (LANTUS) 100 UNIT/ML Solostar Pen Inject 24 Units into the skin daily at 10 pm. (Patient taking differently: Inject 60 Units into the skin daily at 10 pm. ) 15 mL 11  . levothyroxine (SYNTHROID) 100 MCG tablet Take 1 tablet (100 mcg total) by mouth daily before breakfast. 90 tablet 0  . metFORMIN (GLUCOPHAGE-XR) 500 MG 24 hr tablet Take 1 tablet (500 mg total) by mouth 2 (two) times daily with a meal. (Patient taking differently: Take 1,000 mg by mouth 2 (two) times daily with a meal. ) 60 tablet 1  . metoprolol tartrate (LOPRESSOR) 100 MG tablet Take 1 tablet (100 mg total) by mouth 2  (two) times daily. 60 tablet 1  . Multiple Vitamin (MULTIVITAMIN WITH MINERALS) TABS tablet Take 1 tablet by mouth 2 (two) times daily.    . nitroGLYCERIN (NITROSTAT) 0.4 MG SL tablet Place 0.4 mg under the tongue every 5 (five) minutes as needed for chest pain.    . Omega-3 Fatty Acids (FISH OIL PO) Take 900 mg by mouth daily.     . ramipril (ALTACE) 10 MG capsule Take 1 capsule (10 mg total) by mouth daily. 30 capsule 1  . simvastatin (ZOCOR) 40 MG tablet TAKE 1 TABLET BY MOUTH DAILY (Patient taking differently: TAKE 1 TABLET BY MOUTH DAILY IN THE EVENING.) 90 tablet 3   No current facility-administered medications for this visit.      Allergies:   Patient has no known allergies.   Social History:  The patient  reports that  has never smoked. he has never used smokeless tobacco. He reports that he drinks about 1.8 oz of alcohol per week. He reports that he does not use drugs.   Family History:   family history includes Cancer (age of onset: 21) in his father; Cirrhosis in his sister;  Drug abuse in his sister; Heart attack in his father and mother.    Review of Systems: Review of Systems  Constitutional: Negative.   Respiratory: Negative.   Cardiovascular: Negative.   Gastrointestinal: Negative.   Musculoskeletal: Negative.   Neurological: Negative.   Psychiatric/Behavioral: Negative.   All other systems reviewed and are negative.    PHYSICAL EXAM: VS:  BP 112/64 (BP Location: Left Arm, Patient Position: Sitting, Cuff Size: Normal)   Pulse 70   Ht 5\' 8"  (1.727 m)   Wt 214 lb 4 oz (97.2 kg)   BMI 32.58 kg/m  , BMI Body mass index is 32.58 kg/m. GEN: Well nourished, well developed, in no acute distress  HEENT: normal  Neck: no JVD, carotid bruits, or masses Cardiac: RRR; no murmurs, rubs, or gallops,no edema  Respiratory:  clear to auscultation bilaterally, normal work of breathing GI: soft, nontender, nondistended, + BS MS: no deformity or atrophy  Skin: warm and dry, no  rash Neuro:  Strength and sensation are intact Psych: euthymic mood, full affect     Recent Labs: 11/30/2016: BUN 11; Creatinine, Ser 0.82; Hemoglobin 15.6; Platelets 112; Potassium 4.5; Sodium 137    Lipid Panel Lab Results  Component Value Date   CHOL 197 02/16/2014   HDL 34.10 (L) 02/16/2014   LDLCALC 61 04/11/2013   TRIG 402.0 (H) 02/16/2014      Wt Readings from Last 3 Encounters:  01/31/17 214 lb 4 oz (97.2 kg)  12/12/16 209 lb 8 oz (95 kg)  11/29/16 207 lb (93.9 kg)       ASSESSMENT AND PLAN:  Coronary artery disease of native artery of native heart with stable angina pectoris (HCC) Recent cardiac catheterization x2,  Stressed importance of aggressive diabetes control Total cholesterol above goal Unable to tolerate alternate strategies for cholesterol management, tried other statins, tried Zetia with side effects  recommend we consider Repatha/praluent  Mixed hyperlipidemia As above, recommended more aggressive cholesterol management We will discuss with pharmacy to see if he is a candidate for Repatha/praluent  Essential hypertension Blood pressure is well controlled on today's visit. No changes made to the medications.  Paroxysmal atrial fibrillation (HCC)  postoperatively he had brief period of atrial tachycardia/atrial fibrillation, started on amiodarone,  Maintaining normal sinus rhythm  S/P coronary artery stent placement PTCA with stent to the RCA, on aspirin Plavix Currently with anginal symptoms  Uncontrolled type 2 diabetes mellitus with complication, with long-term current use of insulin ([place]) Stressed importance of weight loss and low carbohydrate diet, exercise program Also working with endocrinology  S/P CABG (coronary artery bypass graft) Stable   Total encounter time more than 25 minutes  Greater than 50% was spent in counseling and coordination of care with the patient   Disposition:   F/U  6 months   Orders Placed This  Encounter  Procedures  . EKG 12-Lead     Signed, Esmond Plants, M.D., Ph.D. 01/31/2017  [place], [place]

## 2017-02-18 ENCOUNTER — Other Ambulatory Visit: Payer: Self-pay | Admitting: Cardiovascular Disease

## 2017-02-19 MED ORDER — RAMIPRIL 10 MG PO CAPS: 10.0000 mg | ORAL_CAPSULE | Freq: Every day | ORAL | 3 refills | Status: DC

## 2017-03-06 ENCOUNTER — Other Ambulatory Visit: Payer: Self-pay | Admitting: *Deleted

## 2017-03-06 ENCOUNTER — Telehealth: Payer: Self-pay | Admitting: Cardiovascular Disease

## 2017-03-06 MED ORDER — CLOPIDOGREL BISULFATE 75 MG PO TABS: 75.0000 mg | ORAL_TABLET | Freq: Every day | ORAL | 3 refills | Status: DC

## 2017-03-06 MED ORDER — METOPROLOL TARTRATE 100 MG PO TABS: 100.0000 mg | ORAL_TABLET | Freq: Two times a day (BID) | ORAL | 3 refills | Status: DC

## 2017-03-06 NOTE — Telephone Encounter (Signed)
Refill Plavix and metoprolol 90 day supply [facility]

## 2017-03-06 NOTE — Telephone Encounter (Signed)
°*  STAT* If patient is at the pharmacy, call can be transferred to refill team.   1. Which medications need to be refilled? (please list name of each medication and dose if known)  Clopidogrel  Metoprolol   2. Which pharmacy/location (including street and city if local pharmacy) is [facility] [address]   3. Do they need a 30 day or 90 day supply? 90 day

## 2017-04-23 ENCOUNTER — Other Ambulatory Visit: Payer: Self-pay

## 2017-04-23 MED ORDER — SIMVASTATIN 40 MG PO TABS: 40.0000 mg | ORAL_TABLET | Freq: Every evening | ORAL | 3 refills | Status: DC

## 2017-06-21 ENCOUNTER — Other Ambulatory Visit: Payer: Self-pay | Admitting: Cardiovascular Disease

## 2017-07-30 ENCOUNTER — Encounter (INDEPENDENT_AMBULATORY_CARE_PROVIDER_SITE_OTHER): Payer: 59 | Admitting: Ophthalmology

## 2017-07-30 DIAGNOSIS — E113391 Type 2 diabetes mellitus with moderate nonproliferative diabetic retinopathy without macular edema, right eye: Secondary | ICD-10-CM

## 2017-07-30 DIAGNOSIS — E113292 Type 2 diabetes mellitus with mild nonproliferative diabetic retinopathy without macular edema, left eye: Secondary | ICD-10-CM

## 2017-07-30 DIAGNOSIS — I10 Essential (primary) hypertension: Secondary | ICD-10-CM | POA: Diagnosis not present

## 2017-07-30 DIAGNOSIS — E11319 Type 2 diabetes mellitus with unspecified diabetic retinopathy without macular edema: Secondary | ICD-10-CM | POA: Diagnosis not present

## 2017-07-30 DIAGNOSIS — H353122 Nonexudative age-related macular degeneration, left eye, intermediate dry stage: Secondary | ICD-10-CM | POA: Diagnosis not present

## 2017-07-30 DIAGNOSIS — H35341 Macular cyst, hole, or pseudohole, right eye: Secondary | ICD-10-CM

## 2017-07-30 DIAGNOSIS — H43813 Vitreous degeneration, bilateral: Secondary | ICD-10-CM

## 2017-07-30 DIAGNOSIS — H2513 Age-related nuclear cataract, bilateral: Secondary | ICD-10-CM

## 2017-07-30 DIAGNOSIS — H35033 Hypertensive retinopathy, bilateral: Secondary | ICD-10-CM

## 2017-09-13 DIAGNOSIS — E785 Hyperlipidemia, unspecified: Secondary | ICD-10-CM

## 2017-09-13 DIAGNOSIS — E1169 Type 2 diabetes mellitus with other specified complication: Secondary | ICD-10-CM | POA: Insufficient documentation

## 2017-09-18 ENCOUNTER — Other Ambulatory Visit: Payer: Self-pay

## 2017-09-18 MED ORDER — RAMIPRIL 10 MG PO CAPS: 10.0000 mg | ORAL_CAPSULE | Freq: Every day | ORAL | 3 refills | Status: DC

## 2017-10-02 ENCOUNTER — Other Ambulatory Visit: Payer: Self-pay | Admitting: *Deleted

## 2017-10-02 MED ORDER — METOPROLOL TARTRATE 100 MG PO TABS: 100.0000 mg | ORAL_TABLET | Freq: Two times a day (BID) | ORAL | 0 refills | Status: DC

## 2017-10-22 ENCOUNTER — Other Ambulatory Visit: Payer: Self-pay

## 2017-10-22 MED ORDER — METOPROLOL TARTRATE 100 MG PO TABS: 100.0000 mg | ORAL_TABLET | Freq: Two times a day (BID) | ORAL | 0 refills | Status: DC

## 2017-10-22 NOTE — Telephone Encounter (Signed)
*  STAT* If patient is at the pharmacy, call can be transferred to refill team.   1. Which medications need to be refilled? (please list name of each medication and dose if known) Metoprolol  2. Which pharmacy/location (including street and city if local pharmacy) is medication to be sent tto?[facility]  3. Do they need a 30 day or 90 day supply? [place]

## 2017-11-25 NOTE — Progress Notes (Deleted)
Cardiology Office Note  Date:  11/25/2017   ID:  Steadman, Prosperi 04/22/1955, MRN [id number]  PCP:  Kelton Pillar, MD   No chief complaint on file.   HPI:  Mr. Mcmeen is a very pleasant 62 year old male with a history of  coronary artery disease,  multiple cardiac catheterizations with intervention dating back to 1994,  total of 8 stents,  Diabetes, hemoglobin A1c greater than 8  hyperlipidemia, difficult time tolerating numerous statins including Zetia (Tolerating simvastatin 40) Paroxysmal atrial fibrillation, post op CABG  presented to [facility] with angina, Cardiac catheterization showing three-vessel disease, referred to [facility] for CABG. 01/29/15 He presents today for follow-up of his coronary artery disease, CABG  Unstable angina on his last clinic visit Cardiac catheterization November 24, 2016 1.  Significant underlying three-vessel coronary artery disease with patent LIMA to LAD and patent SVG to OM1.  Occluded SVG to OM 2 and SVG to right PDA. 2.  Low normal LV systolic function with an EF of 50% with normal left ventricular end-diastolic pressure. 3.  Unsuccessful attempted angioplasty of the right coronary artery due to inability to advance the wire into the distal right coronary artery as the result of tortuosity and a bifurcation with a large RV marginal.  Reattempt at intervention of the RCA November 29, 2016 in [place] Successful complex angioplasty and drug-eluting stent placement to the mid right coronary artery. Very difficult procedure due to calcified vessel with difficulty advancing equipment.  The RV branch was jailed by the stent but had TIMI-3 flow.  Discussed other lab work with him Hemoglobin A1c 8.7 Using larger amounts of insulin  Total regular exercise program Denies any anginal symptoms Feels stent to the RCA was effective for his previous chest pain symptoms  Hemoglobin 8.7, total cholesterol 160,   EKG personally reviewed by  myself on todays visit Shows normal sinus rhythm rate 70 bpm T-wave abnormality anterolateral leads, seen on previous EKGs  Other past medical history reviewed No smoking history.   PMH:   has a past medical history of Childhood asthma, Chronic kidney disease, Coronary artery disease, Essential hypertension, GERD (gastroesophageal reflux disease), Heart murmur, History of kidney stones, Hyperlipidemia, Hypothyroidism, Mild sleep apnea, Myocardial infarction ([place]) (1996), and Type II diabetes mellitus ([place]).  PSH:    Past Surgical History:  Procedure Laterality Date  . [place]; Dr.Harold; [facility].  . CARDIAC CATHETERIZATION  2002   "total of 8 stents by now" (11/29/2016)  . CARDIAC CATHETERIZATION N/A 01/21/2015   Procedure: Left Heart Cath and Coronary Angiography;  Surgeon: Wellington Hampshire, MD;  Location: [place] [facility];  Service: Cardiovascular;  Laterality: N/A;  . CARDIAC CATHETERIZATION  11/24/2016   "couldn't get stent in this time" (11/29/2016)  . [place]; Dr. Joneen Boers; [employer] [facility]  . [place]   ""  . CORONARY ANGIOPLASTY WITH STENT PLACEMENT     ''''  . CORONARY ANGIOPLASTY WITH STENT PLACEMENT     '''  . CORONARY ANGIOPLASTY WITH STENT PLACEMENT     '''  . CORONARY ANGIOPLASTY WITH STENT PLACEMENT  2006   "2 stents for a total of 10 stents"; [facility]; [place]   . CORONARY ANGIOPLASTY WITH STENT PLACEMENT  11/29/2016   "1 stent"  . CORONARY ARTERY BYPASS GRAFT N/A 01/29/2015   Procedure: CORONARY ARTERY BYPASS GRAFTING (CABG)x four using left internal mammary artery artery and  bilateral saphenous thigh vein using endoscope.;  Surgeon: Ivin Poot, MD;  Location: [place];  Service: Open Heart Surgery;  Laterality: N/A;  . CORONARY STENT INTERVENTION N/A 11/24/2016   Procedure: CORONARY STENT INTERVENTION;  Surgeon: Wellington Hampshire, MD;  Location: [place] [facility];  Service: Cardiovascular;  Laterality: N/A;  . CORONARY STENT INTERVENTION N/A 11/29/2016   Procedure: CORONARY STENT INTERVENTION;  Surgeon: Wellington Hampshire, MD;  Location: [place] [facility];  Service: Cardiovascular;  Laterality: N/A;  . GANGLION CYST EXCISION Right   . LEFT HEART CATH AND CORONARY ANGIOGRAPHY Left 11/24/2016   Procedure: LEFT HEART CATH AND CORONARY ANGIOGRAPHY;  Surgeon: Wellington Hampshire, MD;  Location: [place] [facility];  Service: Cardiovascular;  Laterality: Left;  . NASAL SEPTUM SURGERY    . TEE WITHOUT CARDIOVERSION N/A 01/29/2015   Procedure: TRANSESOPHAGEAL ECHOCARDIOGRAM (TEE);  Surgeon: Ivin Poot, MD;  Location: [place];  Service: Open Heart Surgery;  Laterality: N/A;  . THYROIDECTOMY      Current Outpatient Medications  Medication Sig Dispense Refill  . aspirin EC 81 MG tablet Take 81 mg by mouth daily.    . clopidogrel (PLAVIX) 75 MG tablet Take 1 tablet (75 mg total) by mouth daily. 90 tablet 3  . Coenzyme Q10 (COQ10) 100 MG CAPS Take 100 mg by mouth daily.    . Continuous Blood Gluc Sensor MISC 1 each by Does not apply route as directed. Use as directed every 10 days. May dispense FreeStyle [employer] or similar. 3 each 0  . Insulin Glargine (LANTUS) 100 UNIT/ML Solostar Pen Inject 24 Units into the skin daily at 10 pm. (Patient taking differently: Inject 60 Units into the skin daily at 10 pm. ) 15 mL 11  . levothyroxine (SYNTHROID) 100 MCG tablet Take 1 tablet (100 mcg total) by mouth daily before breakfast. 90 tablet 0  . metFORMIN (GLUCOPHAGE-XR) 500 MG 24 hr tablet Take 1 tablet (500 mg total) by mouth 2 (two) times daily with a meal. (Patient taking differently: Take 1,000 mg by mouth 2 (two) times daily with a meal. ) 60 tablet 1  . metoprolol tartrate (LOPRESSOR) 100 MG tablet Take 1 tablet (100 mg total) by mouth 2 (two) times daily. 180 tablet 0  . Multiple Vitamin (MULTIVITAMIN WITH  MINERALS) TABS tablet Take 1 tablet by mouth 2 (two) times daily.    . nitroGLYCERIN (NITROSTAT) 0.4 MG SL tablet Place 0.4 mg under the tongue every 5 (five) minutes as needed for chest pain.    . Omega-3 Fatty Acids (FISH OIL PO) Take 900 mg by mouth daily.     . ramipril (ALTACE) 10 MG capsule Take 1 capsule (10 mg total) by mouth daily. 90 capsule 3  . ramipril (ALTACE) 10 MG capsule Take 1 capsule (10 mg total) by mouth daily. 90 capsule 3  . simvastatin (ZOCOR) 40 MG tablet Take 1 tablet (40 mg total) by mouth every evening. 90 tablet 3   No current facility-administered medications for this visit.      Allergies:   Patient has no known allergies.   Social History:  The patient  reports that he has never smoked. He has never used smokeless tobacco. He reports that he drinks about 3.0 standard drinks of alcohol per week. He reports that he does not use drugs.   Family History:   family history includes Cancer (age of onset: 26) in his father; Cirrhosis in his sister; Drug abuse in his  sister; Heart attack in his father and mother.    Review of Systems: Review of Systems  Constitutional: Negative.   Respiratory: Negative.   Cardiovascular: Negative.   Gastrointestinal: Negative.   Musculoskeletal: Negative.   Neurological: Negative.   Psychiatric/Behavioral: Negative.   All other systems reviewed and are negative.    PHYSICAL EXAM: VS:  There were no vitals taken for this visit. , BMI There is no height or weight on file to calculate BMI. GEN: Well nourished, well developed, in no acute distress  HEENT: normal  Neck: no JVD, carotid bruits, or masses Cardiac: RRR; no murmurs, rubs, or gallops,no edema  Respiratory:  clear to auscultation bilaterally, normal work of breathing GI: soft, nontender, nondistended, + BS MS: no deformity or atrophy  Skin: warm and dry, no rash Neuro:  Strength and sensation are intact Psych: euthymic mood, full affect     Recent  Labs: 11/30/2016: BUN 11; Creatinine, Ser 0.82; Hemoglobin 15.6; Platelets 112; Potassium 4.5; Sodium 137    Lipid Panel Lab Results  Component Value Date   CHOL 197 02/16/2014   HDL 34.10 (L) 02/16/2014   LDLCALC 61 04/11/2013   TRIG 402.0 (H) 02/16/2014      Wt Readings from Last 3 Encounters:  01/31/17 214 lb 4 oz (97.2 kg)  12/12/16 209 lb 8 oz (95 kg)  11/29/16 207 lb (93.9 kg)       ASSESSMENT AND PLAN:  Coronary artery disease of native artery of native heart with stable angina pectoris (HCC) Recent cardiac catheterization x2,  Stressed importance of aggressive diabetes control Total cholesterol above goal Unable to tolerate alternate strategies for cholesterol management, tried other statins, tried Zetia with side effects  recommend we consider Repatha/praluent  Mixed hyperlipidemia As above, recommended more aggressive cholesterol management We will discuss with pharmacy to see if he is a candidate for Repatha/praluent  Essential hypertension Blood pressure is well controlled on today's visit. No changes made to the medications.  Paroxysmal atrial fibrillation (HCC)  postoperatively he had brief period of atrial tachycardia/atrial fibrillation, started on amiodarone,  Maintaining normal sinus rhythm  S/P coronary artery stent placement PTCA with stent to the RCA, on aspirin Plavix Currently with anginal symptoms  Uncontrolled type 2 diabetes mellitus with complication, with long-term current use of insulin ([place]) Stressed importance of weight loss and low carbohydrate diet, exercise program Also working with endocrinology  S/P CABG (coronary artery bypass graft) Stable   Total encounter time more than 25 minutes  Greater than 50% was spent in counseling and coordination of care with the patient   Disposition:   F/U  6 months   No orders of the defined types were placed in this encounter.    Signed, Esmond Plants, M.D., Ph.D. 11/25/2017  [place], [place]

## 2017-11-27 ENCOUNTER — Ambulatory Visit: Payer: 59 | Admitting: Cardiovascular Disease

## 2017-11-28 NOTE — Progress Notes (Signed)
Cardiology Office Note Date:  11/29/2017  Patient ID:  Juan [name], Juan [name] 10/23/1955, MRN [id number] PCP:  Kelton Pillar, MD  Cardiologist:  Dr. Rockey Situ, MD    Chief Complaint: Follow up  History of Present Illness: Juan [name] is a 62 y.o. male with history of CAD numerous stents over the years s/p 4-vessel CABG in 12/2014, postoperative Afib, DM2, HTN, HLD with intolerance to several statins and Zetia, hypothyroidism, and sleep apnea who presents for follow up of his CAD.  Patient's cardiac history date back to 1994 with numerous interventions over years totaling a reported "8 stents." Admitted to the hospital in 12/2014 for angina with LHC showing 3-vessel CAD and recommendation for CABG. He underwent 4-vessel CABG on 01/29/2015 with LIMA to LAD, SVG to OM1, SVG to OM2, SVG to PDA. Postoperative coarse was notable for Afib without noted recurrence on EKGs since. Developed symptoms concerning for angina in 10/2016 with LHC on 11/24/2016 showing significant 3-vessel CAD with a patent LIMA to LAD, patent SVG to OM1, occluded SVG to OM2 and occluded SVG to PDA. Attempted PCI of the native RCA was unsuccessful due to inability to advance the wire into the distal RCA as the result of tortuosity and bifurcation with a large RV marginal. Low normal LVSF with an EF of 50% and normal LVEDP. Reattempt intervention of the RCA on 11/29/2016 was successful with complex PCI/DES into the mid RCA. The procedure was overall very difficult due to calcified vessel with difficulty advancing equipment. The RV branch was jailed by the stent but had TIMI-3 flow.   Patient comes in doing well today and is accompanied by his wife. He has not had any further symptoms concerning for angina or any symptoms prior to his prior angina. He does note some SOB if he tries to walk > 4 mph, but "to be honest, I haven't really been doing that." He notes lately he has been more sedentary. He is compliant with all  medications including DAPT. He is now 12 months out to the day of his most recent PCI. He does note some mild bruising on his legs with trauma. His wife notes he snores and is frequently fatigued.   Past Medical History:  Diagnosis Date  . Childhood asthma   . Chronic kidney disease   . Coronary artery disease    a. Dating back to 66 w/ h/o MI.  8 stents;  b. 10/2003 Cath AL: LM 20d, RI 40p, LAD 30p, 27m, patent stent, D1 50p, LCX 30p, patent mid stent, 90d/diffuse, RCA dominant, 30p, patent stent, 88m/d, EF 44%-->Med Rx.  . Essential hypertension   . GERD (gastroesophageal reflux disease)   . Heart murmur    found during office visit  . History of kidney stones   . Hyperlipidemia   . Hypothyroidism   . Mild sleep apnea    "don't need mask" (11/29/2016)  . Myocardial infarction ([place]) 1996   "small MI"   . Type II diabetes mellitus ([place])     Past Surgical History:  Procedure Laterality Date  . [place]; Dr.Harold; [facility].  . CARDIAC CATHETERIZATION  2002   "total of 8 stents by now" (11/29/2016)  . CARDIAC CATHETERIZATION N/A 01/21/2015   Procedure: Left Heart Cath and Coronary Angiography;  Surgeon: Wellington Hampshire, MD;  Location: [place] [facility];  Service: Cardiovascular;  Laterality: N/A;  . CARDIAC CATHETERIZATION  11/24/2016   "couldn't get stent in this time" (11/29/2016)  .  [place]; Dr. Joneen Boers; [employer] [facility]  . [place]   ""  . CORONARY ANGIOPLASTY WITH STENT PLACEMENT     ''''  . CORONARY ANGIOPLASTY WITH STENT PLACEMENT     '''  . CORONARY ANGIOPLASTY WITH STENT PLACEMENT     '''  . CORONARY ANGIOPLASTY WITH STENT PLACEMENT  2006   "2 stents for a total of 10 stents"; [facility]; [place]   . CORONARY ANGIOPLASTY WITH STENT PLACEMENT  11/29/2016   "1 stent"  . CORONARY ARTERY BYPASS GRAFT N/A 01/29/2015   Procedure:  CORONARY ARTERY BYPASS GRAFTING (CABG)x four using left internal mammary artery artery and bilateral saphenous thigh vein using endoscope.;  Surgeon: Ivin Poot, MD;  Location: [place];  Service: Open Heart Surgery;  Laterality: N/A;  . CORONARY STENT INTERVENTION N/A 11/24/2016   Procedure: CORONARY STENT INTERVENTION;  Surgeon: Wellington Hampshire, MD;  Location: [place] [facility];  Service: Cardiovascular;  Laterality: N/A;  . CORONARY STENT INTERVENTION N/A 11/29/2016   Procedure: CORONARY STENT INTERVENTION;  Surgeon: Wellington Hampshire, MD;  Location: [place] [facility];  Service: Cardiovascular;  Laterality: N/A;  . GANGLION CYST EXCISION Right   . LEFT HEART CATH AND CORONARY ANGIOGRAPHY Left 11/24/2016   Procedure: LEFT HEART CATH AND CORONARY ANGIOGRAPHY;  Surgeon: Wellington Hampshire, MD;  Location: [place] [facility];  Service: Cardiovascular;  Laterality: Left;  . NASAL SEPTUM SURGERY    . TEE WITHOUT CARDIOVERSION N/A 01/29/2015   Procedure: TRANSESOPHAGEAL ECHOCARDIOGRAM (TEE);  Surgeon: Ivin Poot, MD;  Location: [place];  Service: Open Heart Surgery;  Laterality: N/A;  . THYROIDECTOMY      Current Meds  Medication Sig  . aspirin EC 81 MG tablet Take 81 mg by mouth daily.  . clopidogrel (PLAVIX) 75 MG tablet Take 1 tablet (75 mg total) by mouth daily.  . Coenzyme Q10 (COQ10) 100 MG CAPS Take 100 mg by mouth daily.  . Continuous Blood Gluc Sensor MISC 1 each by Does not apply route as directed. Use as directed every 10 days. May dispense FreeStyle [employer] or similar.  . Insulin Glargine (LANTUS) 100 UNIT/ML Solostar Pen Inject 24 Units into the skin daily at 10 pm. (Patient taking differently: Inject 60 Units into the skin daily at 10 pm. )  . levothyroxine (SYNTHROID) 100 MCG tablet Take 1 tablet (100 mcg total) by mouth daily before breakfast.  . metFORMIN (GLUCOPHAGE-XR) 500 MG 24 hr tablet Take 1 tablet (500 mg total) by mouth 2 (two) times daily with a  meal. (Patient taking differently: Take 1,000 mg by mouth 2 (two) times daily with a meal. )  . metoprolol tartrate (LOPRESSOR) 100 MG tablet Take 1 tablet (100 mg total) by mouth 2 (two) times daily.  . Multiple Vitamin (MULTIVITAMIN WITH MINERALS) TABS tablet Take 1 tablet by mouth 2 (two) times daily.  . nitroGLYCERIN (NITROSTAT) 0.4 MG SL tablet Place 0.4 mg under the tongue every 5 (five) minutes as needed for chest pain.  . Omega-3 Fatty Acids (FISH OIL PO) Take 900 mg by mouth daily.   . ramipril (ALTACE) 10 MG capsule Take 1 capsule (10 mg total) by mouth daily.  . simvastatin (ZOCOR) 40 MG tablet Take 1 tablet (40 mg total) by mouth every evening.    Allergies:   Patient has no known allergies.   Social History:  The patient  reports that  he has never smoked. He has never used smokeless tobacco. He reports that he drinks about 3.0 standard drinks of alcohol per week. He reports that he does not use drugs.   Family History:  The patient's family history includes Cancer (age of onset: 109) in his father; Cirrhosis in his sister; Drug abuse in his sister; Heart attack in his father and mother.  ROS:   Review of Systems  Constitutional: Positive for malaise/fatigue. Negative for chills, diaphoresis, fever and weight loss.  HENT: Negative for congestion.   Eyes: Negative for discharge and redness.  Respiratory: Positive for shortness of breath. Negative for cough, hemoptysis, sputum production and wheezing.   Cardiovascular: Negative for chest pain, palpitations, orthopnea, claudication, leg swelling and PND.  Gastrointestinal: Negative for abdominal pain, blood in stool, heartburn, melena, nausea and vomiting.  Genitourinary: Negative for hematuria.  Musculoskeletal: Negative for falls and myalgias.  Skin: Negative for rash.  Neurological: Positive for weakness. Negative for dizziness, tingling, tremors, sensory change, speech change, focal weakness and loss of consciousness.    Endo/Heme/Allergies: Does not bruise/bleed easily.  Psychiatric/Behavioral: Negative for substance abuse. The patient is not nervous/anxious.   All other systems reviewed and are negative.    PHYSICAL EXAM:  VS:  BP 130/78 (BP Location: Left Arm, Patient Position: Sitting, Cuff Size: Normal)   Pulse 75   Ht 5\' 8"  (1.727 m)   Wt 207 lb (93.9 kg)   BMI 31.47 kg/m  BMI: Body mass index is 31.47 kg/m.  Physical Exam  Constitutional: He is oriented to person, place, and time. He appears well-developed and well-nourished.  HENT:  Head: Normocephalic and atraumatic.  Eyes: Right eye exhibits no discharge. Left eye exhibits no discharge.  Neck: Normal range of motion. No JVD present.  Cardiovascular: Normal rate, regular rhythm, S1 normal and S2 normal. Exam reveals no distant heart sounds, no friction rub, no midsystolic click and no opening snap.  Murmur heard.  Harsh midsystolic murmur is present with a grade of 1/6 at the upper right sternal border radiating to the neck. Pulses:      Posterior tibial pulses are 2+ on the right side, and 2+ on the left side.  Pulmonary/Chest: Effort normal and breath sounds normal. No respiratory distress. He has no decreased breath sounds. He has no wheezes. He has no rales. He exhibits no tenderness.  Abdominal: Soft. He exhibits no distension. There is no tenderness.  Musculoskeletal: He exhibits edema.  Trace bilateral lower extremity edema  Neurological: He is alert and oriented to person, place, and time.  Skin: Skin is warm and dry. No cyanosis. Nails show no clubbing.  Mild bruise of the left lower leg  Psychiatric: He has a normal mood and affect. His speech is normal and behavior is normal. Judgment and thought content normal.     EKG:  Was ordered and interpreted by me today. Shows NSR, 75 bpm, TWI I, II, aVL, V3-V6 (unchanged)  Recent Labs: 11/30/2016: BUN 11; Creatinine, Ser 0.82; Hemoglobin 15.6; Platelets 112; Potassium 4.5; Sodium  137  No results found for requested labs within last 8760 hours.   CrCl cannot be calculated (Patient's most recent lab result is older than the maximum 21 days allowed.).   Wt Readings from Last 3 Encounters:  11/29/17 207 lb (93.9 kg)  01/31/17 214 lb 4 oz (97.2 kg)  12/12/16 209 lb 8 oz (95 kg)     Other studies reviewed: Additional studies/records reviewed today include: summarized above  ASSESSMENT AND PLAN:  1. CAD s/p CABG s/p PCI as above: No angina. Check echo given dyspnea as below, though patient feels like this is in the setting of deconditioning. Remains on DAPT with ASA and Plavix. He is now 1 years out from his most recent PCI. However, given the number of prior stents I will not discontinue Plavix at this time and will discuss further with his primary cardiologist. Aggressive risk factor modification and secondary prevention.   2. Murmur: Check echo.   3. Snoring/fatigue: Referral to pulmonology for possible sleep study.    4. Dyspnea/lower extremity swelling: Check echo. Likely in the setting deconditioning. Advance activity as tolerated pending echo results.   5. Postoperative Afib: No evidence of recurrence. Monitor.   6. HTN: Blood pressure well controlled. Continue Lopressor 100 mg bid and ramipril 10 mg daily.   7. HLD: LDL of 47 from 07/2017 in Care Everywhere with normal LFT at that time. Remains on Zocor.   8. DM2: A1c of 8.6 from 07/2017. Per PCP.   Disposition: F/u with Dr. Rockey Situ in 6 months  Current medicines are reviewed at length with the patient today.  The patient did not have any concerns regarding medicines.  Signed, Christell Faith, PA-C 11/29/2017 3:41 PM     [place] [place], [place] [address] [phone]

## 2017-11-29 ENCOUNTER — Ambulatory Visit (INDEPENDENT_AMBULATORY_CARE_PROVIDER_SITE_OTHER): Payer: 59 | Admitting: Physician Assistant

## 2017-11-29 ENCOUNTER — Encounter: Payer: Self-pay | Admitting: Physician Assistant

## 2017-11-29 ENCOUNTER — Other Ambulatory Visit
Admission: RE | Admit: 2017-11-29 | Discharge: 2017-11-29 | Disposition: A | Discharge status: Home / Self Care | Payer: 59 | Source: Ambulatory Visit | Attending: Physician Assistant | Admitting: Physician Assistant

## 2017-11-29 VITALS — BP 130/78 | HR 75 | Ht 68.0 in | Wt 207.0 lb

## 2017-11-29 DIAGNOSIS — I10 Essential (primary) hypertension: Secondary | ICD-10-CM

## 2017-11-29 DIAGNOSIS — E782 Mixed hyperlipidemia: Secondary | ICD-10-CM

## 2017-11-29 DIAGNOSIS — I4891 Unspecified atrial fibrillation: Secondary | ICD-10-CM

## 2017-11-29 DIAGNOSIS — I2510 Atherosclerotic heart disease of native coronary artery without angina pectoris: Secondary | ICD-10-CM

## 2017-11-29 DIAGNOSIS — R5383 Other fatigue: Secondary | ICD-10-CM

## 2017-11-29 DIAGNOSIS — R0602 Shortness of breath: Secondary | ICD-10-CM | POA: Diagnosis present

## 2017-11-29 DIAGNOSIS — R011 Cardiac murmur, unspecified: Secondary | ICD-10-CM | POA: Diagnosis present

## 2017-11-29 DIAGNOSIS — I9789 Other postprocedural complications and disorders of the circulatory system, not elsewhere classified: Secondary | ICD-10-CM

## 2017-11-29 DIAGNOSIS — Z951 Presence of aortocoronary bypass graft: Secondary | ICD-10-CM

## 2017-11-29 LAB — CBC
HCT: 48.2 % (ref 39.0–52.0)
Hemoglobin: 16.4 g/dL (ref 13.0–17.0)
MCH: 32 pg (ref 26.0–34.0)
MCHC: 34 g/dL (ref 30.0–36.0)
MCV: 94 fL (ref 80.0–100.0)
Platelets: 133 10*3/uL — ABNORMAL LOW (ref 150–400)
RBC: 5.13 MIL/uL (ref 4.22–5.81)
RDW: 13.5 % (ref 11.5–15.5)
WBC: 5.3 10*3/uL (ref 4.0–10.5)
nRBC: 0 % (ref 0.0–0.2)

## 2017-11-29 LAB — BASIC METABOLIC PANEL
ANION GAP: 13 (ref 5–15)
BUN: 20 mg/dL (ref 8–23)
CALCIUM: 9.4 mg/dL (ref 8.9–10.3)
CO2: 23 mmol/L (ref 22–32)
Chloride: 104 mmol/L (ref 98–111)
Creatinine, Ser: 0.86 mg/dL (ref 0.61–1.24)
GFR calc Af Amer: >60 mL/min (ref >60)
GLUCOSE: 175 mg/dL — AB (ref 70–99)
Potassium: 4.1 mmol/L (ref 3.5–5.1)
Sodium: 140 mmol/L (ref 135–145)

## 2017-11-29 NOTE — Patient Instructions (Signed)
Medication Instructions:  No changes   If you need a refill on your cardiac medications before your next appointment, please call your pharmacy.   Lab work: Your provider would like for you to have the following labs today: CBC and BMET  If you have labs (blood work) drawn today and your tests are completely normal, you will receive your results only by: Marland Kitchen MyChart Message (if you have MyChart) OR . A paper copy in the mail If you have any lab test that is abnormal or we need to change your treatment, we will call you to review the results.  Testing/Procedures: Your physician has requested that you have an echocardiogram. Echocardiography is a painless test that uses sound waves to create images of your heart. It provides your doctor with information about the size and shape of your heart and how well your heart's chambers and valves are working. You may receive an ultrasound enhancing agent through an IV if needed to better visualize your heart during the echo.This procedure takes approximately one hour. There are no restrictions for this procedure. This will take place at the [facility] clinic.    Follow-Up: At [facility], you and your health needs are our priority.  As part of our continuing mission to provide you with exceptional heart care, we have created designated Provider Care Teams.  These Care Teams include your primary Cardiologist (physician) and Advanced Practice Providers (APPs -  Physician Assistants and Nurse Practitioners) who all work together to provide you with the care you need, when you need it. You will need a follow up appointment in 6 months.  Please call our office 2 months in advance to schedule this appointment.  You may see Dr. Rockey Situ or one of the following Advanced Practice Providers on your designated Care Team:   Murray Hodgkins, NP Christell Faith, PA-C . Marrianne Mood, PA-C  Any Other Special Instructions Will Be Listed Below (If Applicable). A  referral to pulmonology has been placed

## 2017-12-03 ENCOUNTER — Ambulatory Visit (INDEPENDENT_AMBULATORY_CARE_PROVIDER_SITE_OTHER): Payer: 59 | Admitting: Pulmonary Disease

## 2017-12-03 ENCOUNTER — Encounter: Payer: Self-pay | Admitting: Pulmonary Disease

## 2017-12-03 VITALS — BP 112/62 | HR 82 | Resp 16 | Ht 68.0 in | Wt 206.0 lb

## 2017-12-03 DIAGNOSIS — R0683 Snoring: Secondary | ICD-10-CM | POA: Diagnosis not present

## 2017-12-03 DIAGNOSIS — G4710 Hypersomnia, unspecified: Secondary | ICD-10-CM

## 2017-12-03 DIAGNOSIS — I2510 Atherosclerotic heart disease of native coronary artery without angina pectoris: Secondary | ICD-10-CM | POA: Diagnosis not present

## 2017-12-03 DIAGNOSIS — G4719 Other hypersomnia: Secondary | ICD-10-CM | POA: Diagnosis not present

## 2017-12-03 DIAGNOSIS — I10 Essential (primary) hypertension: Secondary | ICD-10-CM | POA: Diagnosis not present

## 2017-12-03 NOTE — Patient Instructions (Signed)
Home sleep study ordered  We will contact you after results are available to initiate therapy if indicated  We will contact you to schedule follow-up

## 2017-12-04 NOTE — Progress Notes (Signed)
PULMONARY/SLEEP OFFICE CONSULT NOTE  Requesting MD/Service: Rockey Situ Date of initial consultation: 12/03/2017 Reason for consultation: Sleep apnea  PT PROFILE: 62 y.o. male never smoker with diabetes, coronary artery disease, hypertension and concern for obstructive sleep apnea  DATA: 01/27/15 PFTs: FVC: 3.91 > 4.33 L (90 > 100 %pred), FEV1: 2.87 > 3.34 L (87>101 % pred), FEV1/FVC: 73%, TLC: 5.72 L (89 %pred), DLCO 140 %pred     INTERVAL:  HPI:  As above.  The patient reports that he did undergo a polysomnogram in [place] several years ago and was told that he has "slight" obstructive sleep apnea.  His wife notes that he snores.  Presently his snoring is not as severe as it was in the past.  Notably, he has lost approximately 30 pounds.  However, he does continue to snore when sleeping on his back.  Previously, she witnessed apneas.  However, these are not common now.  He has mild chronic daytime hypersomnolence.  Epworth sleepiness scale score 9.  He denies morning headache.  He does have mild erectile dysfunction.  Past Medical History:  Diagnosis Date  . Childhood asthma   . Chronic kidney disease   . Coronary artery disease    a. Dating back to 51 w/ h/o MI.  8 stents;  b. 10/2003 Cath AL: LM 20d, RI 40p, LAD 30p, 67m, patent stent, D1 50p, LCX 30p, patent mid stent, 90d/diffuse, RCA dominant, 30p, patent stent, 60m/d, EF 44%-->Med Rx.  . Essential hypertension   . GERD (gastroesophageal reflux disease)   . Heart murmur    found during office visit  . History of kidney stones   . Hyperlipidemia   . Hypothyroidism   . Mild sleep apnea    "don't need mask" (11/29/2016)  . Myocardial infarction ([place]) 1996   "small MI"   . Type II diabetes mellitus ([place])     Past Surgical History:  Procedure Laterality Date  . [place]; Dr.Harold; [facility].  . CARDIAC CATHETERIZATION  2002   "total of 8 stents by now" (11/29/2016)  . CARDIAC  CATHETERIZATION N/A 01/21/2015   Procedure: Left Heart Cath and Coronary Angiography;  Surgeon: Wellington Hampshire, MD;  Location: [place] [facility];  Service: Cardiovascular;  Laterality: N/A;  . CARDIAC CATHETERIZATION  11/24/2016   "couldn't get stent in this time" (11/29/2016)  . [place]; Dr. Joneen Boers; [employer] [facility]  . [place]   ""  . CORONARY ANGIOPLASTY WITH STENT PLACEMENT     ''''  . CORONARY ANGIOPLASTY WITH STENT PLACEMENT     '''  . CORONARY ANGIOPLASTY WITH STENT PLACEMENT     '''  . CORONARY ANGIOPLASTY WITH STENT PLACEMENT  2006   "2 stents for a total of 10 stents"; [facility]; [place]   . CORONARY ANGIOPLASTY WITH STENT PLACEMENT  11/29/2016   "1 stent"  . CORONARY ARTERY BYPASS GRAFT N/A 01/29/2015   Procedure: CORONARY ARTERY BYPASS GRAFTING (CABG)x four using left internal mammary artery artery and bilateral saphenous thigh vein using endoscope.;  Surgeon: Ivin Poot, MD;  Location: [facility];  Service: Open Heart Surgery;  Laterality: N/A;  . CORONARY STENT INTERVENTION N/A 11/24/2016   Procedure: CORONARY STENT INTERVENTION;  Surgeon: Wellington Hampshire, MD;  Location: [place] [facility];  Service: Cardiovascular;  Laterality: N/A;  . CORONARY STENT INTERVENTION N/A 11/29/2016   Procedure: CORONARY STENT INTERVENTION;  Surgeon:  Wellington Hampshire, MD;  Location: [facility];  Service: Cardiovascular;  Laterality: N/A;  . GANGLION CYST EXCISION Right   . LEFT HEART CATH AND CORONARY ANGIOGRAPHY Left 11/24/2016   Procedure: LEFT HEART CATH AND CORONARY ANGIOGRAPHY;  Surgeon: Wellington Hampshire, MD;  Location: [facility];  Service: Cardiovascular;  Laterality: Left;  . NASAL SEPTUM SURGERY    . TEE WITHOUT CARDIOVERSION N/A 01/29/2015   Procedure: TRANSESOPHAGEAL ECHOCARDIOGRAM (TEE);  Surgeon: Ivin Poot, MD;  Location: [facility];  Service: Open Heart  Surgery;  Laterality: N/A;  . THYROIDECTOMY      MEDICATIONS: I have reviewed all medications and confirmed regimen as documented  Social History   Socioeconomic History  . Marital status: Married    Spouse name: Not on file  . Number of children: Not on file  . Years of education: Not on file  . Highest education level: Not on file  Occupational History  . Not on file  Social Needs  . Financial resource strain: Not on file  . Food insecurity:    Worry: Not on file    Inability: Not on file  . Transportation needs:    Medical: Not on file    Non-medical: Not on file  Tobacco Use  . Smoking status: Never Smoker  . Smokeless tobacco: Never Used  Substance and Sexual Activity  . Alcohol use: Yes    Alcohol/week: 3.0 standard drinks    Types: 1 Standard drinks or equivalent, 1 Cans of beer, 1 Shots of liquor per week  . Drug use: No  . Sexual activity: Yes  Lifestyle  . Physical activity:    Days per week: Not on file    Minutes per session: Not on file  . Stress: Not on file  Relationships  . Social connections:    Talks on phone: Not on file    Gets together: Not on file    Attends religious service: Not on file    Active member of club or organization: Not on file    Attends meetings of clubs or organizations: Not on file    Relationship status: Not on file  . Intimate partner violence:    Fear of current or ex partner: Not on file    Emotionally abused: Not on file    Physically abused: Not on file    Forced sexual activity: Not on file  Other Topics Concern  . Not on file  Social History Narrative   Married 35 years.   Exercise: walks 3-4 times a week   Diet: moderate    Family History  Problem Relation Age of Onset  . Heart attack Mother   . Heart attack Father   . Cancer Father 83       early prostate cancer  . Drug abuse Sister   . Cirrhosis Sister     ROS: No fever, myalgias/arthralgias, unexplained weight loss or weight gain No new focal  weakness or sensory deficits No otalgia, hearing loss, visual changes, nasal and sinus symptoms, mouth and throat problems No neck pain or adenopathy No abdominal pain, N/V/D, diarrhea, change in bowel pattern No dysuria, change in urinary pattern   Vitals:   12/03/17 1600 12/03/17 1609  BP:  112/62  Pulse:  82  Resp: 16   SpO2:  96%  Weight: 206 lb (93.4 kg)   Height: 5\' 8"  (1.727 m)      EXAM:  Gen: WDWN, No overt respiratory distress HEENT: NCAT,  sclera white, oropharynx normal Neck: Supple without LAN, thyromegaly, JVD Lungs: breath sounds full, percussion normal, adventitious sounds: None Cardiovascular: RRR, no murmurs noted Abdomen: Soft, nontender, normal BS Ext: without clubbing, cyanosis, edema Neuro: CNs grossly intact, motor and sensory intact Skin: Limited exam, no lesions noted  DATA:   BMP Latest Ref Rng & Units 11/29/2017 11/30/2016 11/29/2016  Glucose 70 - 99 mg/dL 175(H) 211(H) 252(H)  BUN 8 - 23 mg/dL 20 11 16   Creatinine 0.61 - 1.24 mg/dL 0.86 0.82 0.82  Sodium 135 - 145 mmol/L 140 137 135  Potassium 3.5 - 5.1 mmol/L 4.1 4.5 4.4  Chloride 98 - 111 mmol/L 104 102 101  CO2 22 - 32 mmol/L 23 27 23   Calcium 8.9 - 10.3 mg/dL 9.4 9.0 9.1    CBC Latest Ref Rng & Units 11/29/2017 11/30/2016 11/29/2016  WBC 4.0 - 10.5 K/uL 5.3 5.5 5.1  Hemoglobin 13.0 - 17.0 g/dL 16.4 15.6 16.8  Hematocrit 39.0 - 52.0 % 48.2 44.7 47.1  Platelets 150 - 400 K/uL 133(L) 112(L) 125(L)    CXR:    I have personally reviewed all chest radiographs reported above including CXRs and CT chest unless otherwise indicated  IMPRESSION:     ICD-10-CM   1. Snoring R06.83 Home sleep test  2. Daytime hypersomnia G47.19 Home sleep test  3. CAD I25.10   4. Hypertension I10   5.  History of asthma.  This was not discussed during today's visit.  He has not on any specific therapies for this   PLAN:  Home polysomnogram ordered We will contact him with results and next step in  follow-up after results are available to me Follow-up to be arranged.   Upon follow-up, we will discuss whether his asthma warrants further evaluation  Merton Border, MD PCCM service Mobile [phone] Pager [phone] 12/04/2017 1:28 PM

## 2017-12-06 ENCOUNTER — Ambulatory Visit (INDEPENDENT_AMBULATORY_CARE_PROVIDER_SITE_OTHER): Payer: 59

## 2017-12-06 DIAGNOSIS — R011 Cardiac murmur, unspecified: Secondary | ICD-10-CM

## 2017-12-06 DIAGNOSIS — R0602 Shortness of breath: Secondary | ICD-10-CM

## 2017-12-19 DIAGNOSIS — G4733 Obstructive sleep apnea (adult) (pediatric): Secondary | ICD-10-CM | POA: Diagnosis not present

## 2017-12-25 DIAGNOSIS — G4733 Obstructive sleep apnea (adult) (pediatric): Secondary | ICD-10-CM | POA: Diagnosis not present

## 2017-12-26 ENCOUNTER — Telehealth: Payer: Self-pay | Admitting: *Deleted

## 2017-12-26 ENCOUNTER — Encounter: Payer: Self-pay | Admitting: *Deleted

## 2017-12-26 NOTE — Telephone Encounter (Signed)
Pt aware of results of hst. Mild OSA AHI of 8.  Recommend auto-cpap with pressure range of 5-15 cmH20. Patient could also try avoidance of sleeping in supine position, if symptoms do not improve, could start CPAP as above.  Pt requested info be sent to my chart. He will call back if he decides to proceed with cpap therapy.

## 2018-01-01 ENCOUNTER — Other Ambulatory Visit: Payer: Self-pay | Admitting: *Deleted

## 2018-01-01 DIAGNOSIS — G4710 Hypersomnia, unspecified: Secondary | ICD-10-CM

## 2018-01-01 DIAGNOSIS — G4719 Other hypersomnia: Secondary | ICD-10-CM

## 2018-01-01 DIAGNOSIS — R0683 Snoring: Secondary | ICD-10-CM

## 2018-02-01 ENCOUNTER — Other Ambulatory Visit: Payer: Self-pay | Admitting: Cardiovascular Disease

## 2018-02-14 ENCOUNTER — Telehealth: Payer: Self-pay | Admitting: Pulmonary Disease

## 2018-02-14 DIAGNOSIS — G4733 Obstructive sleep apnea (adult) (pediatric): Secondary | ICD-10-CM

## 2018-02-14 NOTE — Telephone Encounter (Signed)
Returned call to patient. Per spouse patient would like order entered for referral.  Order submitted. Nothing further needed.

## 2018-02-19 ENCOUNTER — Telehealth: Payer: Self-pay | Admitting: Cardiovascular Disease

## 2018-02-19 NOTE — Telephone Encounter (Signed)
° °  [facility] Pre-operative Risk Assessment    Request for surgical clearance:  1. What type of surgery is being performed? Colonoscopy   2. When is this surgery scheduled? 04/17/2018  3. What type of clearance is required (medical clearance vs. Pharmacy clearance to hold med vs. Both)? Both  4. Are there any medications that need to be held prior to surgery and how long? How many days prior to hold Plavix   5. Practice name and name of physician performing surgery? [facility], Dr Alice Reichert   6. What is your office phone number [phone]    7.   What is your office fax number [phone]  8.   Anesthesia type (None, local, MAC, general) ? Monitored    Juan [name] 02/19/2018, 4:42 PM  _________________________________________________________________   (provider comments below)

## 2018-02-20 NOTE — Telephone Encounter (Signed)
Routing to Dr Gollan. 

## 2018-02-22 ENCOUNTER — Telehealth: Payer: Self-pay | Admitting: Pulmonary Disease

## 2018-02-22 DIAGNOSIS — G4733 Obstructive sleep apnea (adult) (pediatric): Secondary | ICD-10-CM

## 2018-02-22 MED ORDER — AMBULATORY NON FORMULARY MEDICATION: 0 refills | Status: DC

## 2018-02-22 NOTE — Telephone Encounter (Signed)
Printed prescription for cpap placed in DS folder for signature. Once signed will contact patient for pick-up.

## 2018-02-22 NOTE — Telephone Encounter (Signed)
LMTCB. Dr. Alva Garnet is back in clinic Monday, will address at that time.

## 2018-02-22 NOTE — Telephone Encounter (Signed)
LMTCB

## 2018-02-24 NOTE — Telephone Encounter (Signed)
Would call to confirm no change in status,  new anginal sx? Ok for procedure if no new symptoms Hold plavix 5 days prior to procedure Restart when cleared by GI

## 2018-02-26 NOTE — Telephone Encounter (Signed)
Prescription completed, at front desk for patient. Left VM for patient.

## 2018-02-26 NOTE — Telephone Encounter (Signed)
No answer with patient. Left message to call back.   

## 2018-02-26 NOTE — Telephone Encounter (Signed)
Pt's wife Juan [name] asked that we give her a call in reference to CPAP prescription, CB # [phone]. I let her know that prescription is here for pick up but she had further questions and asked that we call her and not husband.

## 2018-02-26 NOTE — Telephone Encounter (Signed)
Per Spouse patient in meeting today .  Please call tomorrow to discuss

## 2018-02-26 NOTE — Telephone Encounter (Signed)
Called and spoke with patients wife in regards to CPAP RX. She was questioning whether the rx specified a type of machine or what. I read her the wording on the rx, and she seemed to think it would work. Let her know it was ready for pickup. Nothing further needed at this time.

## 2018-02-27 NOTE — Telephone Encounter (Signed)
Patient denies any new shortness of breath, chest pain, dizziness or any other cardiac symptoms.  Patient verbalized understanding of recommendations regarding holding Plavix.  Routed to number provided via EPIC fax.Juan [name]

## 2018-03-21 NOTE — Telephone Encounter (Signed)
Pt is deciding to change companies with CPAP machine, would like to use [facility], who is requesting demographics, sleep study results, and consult appt.   [place] Fax: [phone]

## 2018-03-22 NOTE — Telephone Encounter (Signed)
Another order placed for cpap to change DME to [facility].

## 2018-03-22 NOTE — Addendum Note (Signed)
Addended by: Stephanie Coup on: 03/22/2018 12:01 PM   Modules accepted: Orders

## 2018-04-16 ENCOUNTER — Encounter: Payer: Self-pay | Admitting: *Deleted

## 2018-04-17 ENCOUNTER — Encounter
Admission: RE | Disposition: A | Discharge status: Home / Self Care | Payer: Self-pay | Source: Home / Self Care | Attending: Internal Medicine

## 2018-04-17 ENCOUNTER — Ambulatory Visit
Admission: RE | Admit: 2018-04-17 | Discharge: 2018-04-17 | Disposition: A | Discharge status: Home / Self Care | Payer: 59 | Attending: Internal Medicine | Admitting: Internal Medicine

## 2018-04-17 ENCOUNTER — Ambulatory Visit: Payer: 59 | Admitting: Anesthesiology

## 2018-04-17 ENCOUNTER — Other Ambulatory Visit: Payer: Self-pay

## 2018-04-17 ENCOUNTER — Encounter: Payer: Self-pay | Admitting: *Deleted

## 2018-04-17 DIAGNOSIS — E039 Hypothyroidism, unspecified: Secondary | ICD-10-CM | POA: Insufficient documentation

## 2018-04-17 DIAGNOSIS — Z7982 Long term (current) use of aspirin: Secondary | ICD-10-CM | POA: Insufficient documentation

## 2018-04-17 DIAGNOSIS — Z951 Presence of aortocoronary bypass graft: Secondary | ICD-10-CM | POA: Diagnosis not present

## 2018-04-17 DIAGNOSIS — E119 Type 2 diabetes mellitus without complications: Secondary | ICD-10-CM | POA: Insufficient documentation

## 2018-04-17 DIAGNOSIS — I2510 Atherosclerotic heart disease of native coronary artery without angina pectoris: Secondary | ICD-10-CM | POA: Diagnosis not present

## 2018-04-17 DIAGNOSIS — Z87442 Personal history of urinary calculi: Secondary | ICD-10-CM | POA: Diagnosis not present

## 2018-04-17 DIAGNOSIS — Z1211 Encounter for screening for malignant neoplasm of colon: Secondary | ICD-10-CM | POA: Diagnosis not present

## 2018-04-17 DIAGNOSIS — Z794 Long term (current) use of insulin: Secondary | ICD-10-CM | POA: Insufficient documentation

## 2018-04-17 DIAGNOSIS — Z7902 Long term (current) use of antithrombotics/antiplatelets: Secondary | ICD-10-CM | POA: Insufficient documentation

## 2018-04-17 DIAGNOSIS — G4730 Sleep apnea, unspecified: Secondary | ICD-10-CM | POA: Diagnosis not present

## 2018-04-17 DIAGNOSIS — Z955 Presence of coronary angioplasty implant and graft: Secondary | ICD-10-CM | POA: Insufficient documentation

## 2018-04-17 DIAGNOSIS — E785 Hyperlipidemia, unspecified: Secondary | ICD-10-CM | POA: Diagnosis not present

## 2018-04-17 DIAGNOSIS — D123 Benign neoplasm of transverse colon: Secondary | ICD-10-CM | POA: Insufficient documentation

## 2018-04-17 DIAGNOSIS — I10 Essential (primary) hypertension: Secondary | ICD-10-CM | POA: Insufficient documentation

## 2018-04-17 DIAGNOSIS — Z79899 Other long term (current) drug therapy: Secondary | ICD-10-CM | POA: Diagnosis not present

## 2018-04-17 DIAGNOSIS — I4891 Unspecified atrial fibrillation: Secondary | ICD-10-CM | POA: Insufficient documentation

## 2018-04-17 DIAGNOSIS — I252 Old myocardial infarction: Secondary | ICD-10-CM | POA: Insufficient documentation

## 2018-04-17 DIAGNOSIS — J45909 Unspecified asthma, uncomplicated: Secondary | ICD-10-CM | POA: Diagnosis not present

## 2018-04-17 HISTORY — PX: COLONOSCOPY WITH PROPOFOL: SHX5780

## 2018-04-17 SURGERY — COLONOSCOPY WITH PROPOFOL
Anesthesia: General

## 2018-04-17 MED ORDER — EPHEDRINE SULFATE 50 MG/ML IJ SOLN
INTRAMUSCULAR | Status: DC | PRN
  Administered 2018-04-17: 5 mg via INTRAVENOUS

## 2018-04-17 MED ORDER — PHENYLEPHRINE HCL 10 MG/ML IJ SOLN
INTRAMUSCULAR | Status: DC | PRN
  Administered 2018-04-17: 100 ug via INTRAVENOUS

## 2018-04-17 MED ORDER — PROPOFOL 10 MG/ML IV BOLUS
INTRAVENOUS | Status: AC
  Filled 2018-04-17: qty 40

## 2018-04-17 MED ORDER — PROPOFOL 10 MG/ML IV BOLUS
INTRAVENOUS | Status: DC | PRN
  Administered 2018-04-17 (×9): 20 mg via INTRAVENOUS
  Administered 2018-04-17 (×2): 50 mg via INTRAVENOUS
  Administered 2018-04-17: 20 mg via INTRAVENOUS

## 2018-04-17 MED ORDER — SODIUM CHLORIDE 0.9 % IV SOLN
INTRAVENOUS | Status: DC
  Administered 2018-04-17: 07:00:00 via INTRAVENOUS

## 2018-04-17 MED ORDER — PROPOFOL 500 MG/50ML IV EMUL
INTRAVENOUS | Status: AC
  Filled 2018-04-17: qty 400

## 2018-04-17 NOTE — Interval H&P Note (Signed)
History and Physical Interval Note:  04/17/2018 7:58 AM  Juan [name]  has presented today for surgery, with the diagnosis of COLON CANCER SCREENING.  The various methods of treatment have been discussed with the patient and family. After consideration of risks, benefits and other options for treatment, the patient has consented to  Procedure(s): COLONOSCOPY WITH PROPOFOL (N/A) as a surgical intervention.  The patient's history has been reviewed, patient examined, no change in status, stable for surgery.  I have reviewed the patient's chart and labs.  Questions were answered to the patient's satisfaction.     [place], [place]

## 2018-04-17 NOTE — Transfer of Care (Signed)
Immediate Anesthesia Transfer of Care Note  Patient: Juan [name]  Procedure(s) Performed: COLONOSCOPY WITH PROPOFOL (N/A )  Patient Location: Endoscopy Unit  Anesthesia Type:General  Level of Consciousness: drowsy and responds to stimulation  Airway & Oxygen Therapy: Patient Spontanous Breathing and Patient connected to face mask oxygen  Post-op Assessment: Report given to RN and Post -op Vital signs reviewed and stable  Post vital signs: Reviewed and stable  Last Vitals:  Vitals Value Taken Time  BP 135/71 04/17/2018  8:30 AM  Temp 36.2 C 04/17/2018  8:30 AM  Pulse 60 04/17/2018  8:31 AM  Resp 15 04/17/2018  8:31 AM  SpO2 99 % 04/17/2018  8:31 AM  Vitals shown include unvalidated device data.  Last Pain:  Vitals:   04/17/18 0830  TempSrc: Tympanic  PainSc:          Complications: No apparent anesthesia complications

## 2018-04-17 NOTE — Anesthesia Postprocedure Evaluation (Signed)
Anesthesia Post Note  Patient: Juan [name]  Procedure(s) Performed: COLONOSCOPY WITH PROPOFOL (N/A )  Patient location during evaluation: Endoscopy Anesthesia Type: General Level of consciousness: awake and alert Pain management: pain level controlled Vital Signs Assessment: post-procedure vital signs reviewed and stable Respiratory status: spontaneous breathing, nonlabored ventilation, respiratory function stable and patient connected to nasal cannula oxygen Cardiovascular status: blood pressure returned to baseline and stable Postop Assessment: no apparent nausea or vomiting Anesthetic complications: no     Last Vitals:  Vitals:   04/17/18 0850 04/17/18 0900  BP: 97/65 106/66  Pulse: 65 64  Resp: 18 16  Temp:    SpO2: 96% 97%    Last Pain:  Vitals:   04/17/18 0900  TempSrc:   PainSc: 0-No pain                 Kartel Wolbert S

## 2018-04-17 NOTE — H&P (Signed)
Outpatient short stay form Pre-procedure 04/17/2018 7:58 AM Teodoro K. Alice Reichert, M.D.  Primary Physician: [place] Pink, M.D.  Reason for visit:  Colon cancer screening  History of present illness:  Patient presents for colon cancer screening. The patient denies abdominal pain, abnormal weight loss or rectal bleeding.    Current Facility-Administered Medications:  .  0.9 %  sodium chloride infusion, , Intravenous, Continuous, [place], Benay Pike, MD, Last Rate: 20 mL/hr at 04/17/18 1829  Medications Prior to Admission  Medication Sig Dispense Refill Last Dose  . aspirin EC 81 MG tablet Take 81 mg by mouth daily.   Past Week at Unknown time  . clopidogrel (PLAVIX) 75 MG tablet Take 1 tablet (75 mg total) by mouth daily. 90 tablet 3 04/10/2018 at Unknown time  . Coenzyme Q10 (COQ10) 100 MG CAPS Take 100 mg by mouth daily.   04/16/2018 at 0630  . Continuous Blood Gluc Sensor MISC 1 each by Does not apply route as directed. Use as directed every 10 days. May dispense FreeStyle [employer] or similar. 3 each 0 04/17/2018 at Unknown time  . empagliflozin (JARDIANCE) 25 MG TABS tablet Take 25 mg by mouth daily.   04/16/2018 at 0630  . Insulin Glargine (LANTUS) 100 UNIT/ML Solostar Pen Inject 24 Units into the skin daily at 10 pm. (Patient taking differently: Inject 60 Units into the skin daily at 10 pm. ) 15 mL 11 Past Week at Unknown time  . levothyroxine (SYNTHROID) 100 MCG tablet Take 1 tablet (100 mcg total) by mouth daily before breakfast. 90 tablet 0 04/16/2018 at 00630  . metFORMIN (GLUCOPHAGE-XR) 500 MG 24 hr tablet Take 1 tablet (500 mg total) by mouth 2 (two) times daily with a meal. (Patient taking differently: Take 1,000 mg by mouth 2 (two) times daily with a meal. ) 60 tablet 1 Past Week at Unknown time  . metoprolol tartrate (LOPRESSOR) 100 MG tablet TAKE 1 TABLET TWICE A DAY 180 tablet 2 04/17/2018 at 0630  . Multiple Vitamin (MULTIVITAMIN WITH MINERALS) TABS tablet Take 1 tablet by  mouth 2 (two) times daily.   Past Week at Unknown time  . nitroGLYCERIN (NITROSTAT) 0.4 MG SL tablet Place 0.4 mg under the tongue every 5 (five) minutes as needed for chest pain.   Past Month at Unknown time  . Omega-3 Fatty Acids (FISH OIL PO) Take 900 mg by mouth daily.    Past Week at Unknown time  . ramipril (ALTACE) 10 MG capsule Take 1 capsule (10 mg total) by mouth daily. 90 capsule 3 04/17/2018 at 0630  . simvastatin (ZOCOR) 40 MG tablet Take 1 tablet (40 mg total) by mouth every evening. 90 tablet 3 Past Week at Unknown time  . AMBULATORY NON FORMULARY MEDICATION Patient has OSA or probable OSA (Yes or No): Yes Is the patient currently using CPAP within the home? (Yes or No): No If yes to question 2, what is the current DME provider?  Are there any changes to the order/settings? (if yes, please specify)  If no to question 2, date of sleep study: 12/19/2017 Date of face-to-face encounter: 12/03/2017 Settings: Auto-Cpap with pressure range of 5-15 cm H20; pt wants small unit for travel Signs and symptoms or probable OSA, mention all that apply (snoring) (morning headaches) (witnessed apneas) (choking) (gasping during sleep):  Resmed S/O *or* [employer] air/auto with heated humidity.   Enroll in [employer] Provide patient with SD card when [employer] enrollment expires CPAP supplies needed: mask of choice,  headgear, cushions, filters, climate control tubing and water chamber.  PT WILL NEED APPOINTMENT IN OFFICE WITH DME FOR CPAP SET 1 each 0      No Known Allergies   Past Medical History:  Diagnosis Date  . Childhood asthma   . Coronary artery disease    a. Dating back to 8 w/ h/o MI.  8 stents;  b. 10/2003 Cath [place]: LM 20d, RI 40p, LAD 30p, 47m, patent stent, D1 50p, LCX 30p, patent mid stent, 90d/diffuse, RCA dominant, 30p, patent stent, 70m/d, EF 44%-->Med Rx.  . Essential hypertension   . GERD (gastroesophageal reflux disease)   . Heart murmur    found  during office visit  . History of kidney stones   . Hyperlipidemia   . Hypothyroidism   . Mild sleep apnea    "don't need mask" (11/29/2016)  . Myocardial infarction ([place]) 1996   "small MI"   . Type II diabetes mellitus ([place])     Review of systems:  Otherwise negative.    Physical Exam  Gen: Alert, oriented. Appears stated age.  HEENT: [place]/AT. PERRLA. Lungs: CTA, no wheezes. CV: RR nl S1, S2. Abd: soft, benign, no masses. BS+ Ext: No edema. Pulses 2+    Planned procedures: Proceed with colonoscopy. The patient understands the nature of the planned procedure, indications, risks, alternatives and potential complications including but not limited to bleeding, infection, perforation, damage to internal organs and possible oversedation/side effects from anesthesia. The patient agrees and gives consent to proceed.  Please refer to procedure notes for findings, recommendations and patient disposition/instructions.     Teodoro K. Alice Reichert, M.D. Gastroenterology 04/17/2018  7:58 AM

## 2018-04-17 NOTE — Anesthesia Preprocedure Evaluation (Signed)
Anesthesia Evaluation  Patient identified by MRN, date of birth, ID band Patient awake    Reviewed: Allergy & Precautions, NPO status , Patient's Chart, lab work & pertinent test results, reviewed documented beta blocker date and time   Airway Mallampati: II  TM Distance: >3 FB     Dental  (+) Chipped   Pulmonary asthma , sleep apnea ,           Cardiovascular hypertension, Pt. on medications and Pt. on home beta blockers + angina + CAD, + Past MI, + Cardiac Stents and + CABG  + dysrhythmias Atrial Fibrillation + Valvular Problems/Murmurs      Neuro/Psych    GI/Hepatic GERD  ,  Endo/Other  diabetes, Type 2Hypothyroidism   Renal/GU      Musculoskeletal   Abdominal   Peds  Hematology   Anesthesia Other Findings EF 55-60.  Reproductive/Obstetrics                             Anesthesia Physical Anesthesia Plan  ASA: III  Anesthesia Plan: General   Post-op Pain Management:    Induction: Intravenous  PONV Risk Score and Plan:   Airway Management Planned:   Additional Equipment:   Intra-op Plan:   Post-operative Plan:   Informed Consent: I have reviewed the patients History and Physical, chart, labs and discussed the procedure including the risks, benefits and alternatives for the proposed anesthesia with the patient or authorized representative who has indicated his/her understanding and acceptance.       Plan Discussed with: CRNA  Anesthesia Plan Comments:         Anesthesia Quick Evaluation

## 2018-04-17 NOTE — Op Note (Signed)
[facility] Patient Name: Juan [name] Procedure Date: 04/17/2018 8:06 AM MRN: [id number] Account #: [id number] Date of Birth: [birth year] Admit Type: Outpatient Age: 63 Room: [facility] ENDO ROOM 3 Gender: Male Note Status: Finalized Procedure:            Colonoscopy Indications:          Screening for colorectal malignant neoplasm Providers:            Benay Pike. Alice Reichert MD, MD Referring MD:         Irven Easterly. Kary Kos, MD (Referring MD) Medicines:            Propofol per Anesthesia Complications:        No immediate complications. Procedure:            Pre-Anesthesia Assessment:                       - The risks and benefits of the procedure and the                        sedation options and risks were discussed with the                        patient. All questions were answered and informed                        consent was obtained.                       - Patient identification and proposed procedure were                        verified prior to the procedure by the nurse. The                        procedure was verified in the procedure room.                       - ASA Grade Assessment: III - A patient with severe                        systemic disease.                       - After reviewing the risks and benefits, the patient                        was deemed in satisfactory condition to undergo the                        procedure.                       After obtaining informed consent, the colonoscope was                        passed under direct vision. Throughout the procedure,                        the patient's blood pressure, pulse, and oxygen  saturations were monitored continuously. The                        Colonoscope was introduced through the anus and                        advanced to the the cecum, identified by appendiceal                        orifice and ileocecal valve. The colonoscopy was                     performed without difficulty. The patient tolerated the                        procedure well. The quality of the bowel preparation                        was excellent. The ileocecal valve, appendiceal                        orifice, and rectum were photographed. Findings:      The perianal and digital rectal examinations were normal. Pertinent       negatives include normal sphincter tone, no palpable rectal lesions and       normal prostate (size, shape, and consistency).      Two sessile polyps were found in the transverse colon. The polyps were 4       to 6 mm in size. These polyps were removed with a cold snare. Resection       and retrieval were complete.      The exam was otherwise without abnormality on direct and retroflexion       views. Impression:           - Two 4 to 6 mm polyps in the transverse colon, removed                        with a cold snare. Resected and retrieved.                       - The examination was otherwise normal on direct and                        retroflexion views. Recommendation:       - Patient has a contact number available for                        emergencies. The signs and symptoms of potential                        delayed complications were discussed with the patient.                        Return to normal activities tomorrow. Written discharge                        instructions were provided to the patient.                       - Resume previous diet.                       -  Continue present medications.                       - Repeat colonoscopy is recommended for surveillance.                        The colonoscopy date will be determined after pathology                        results from today's exam become available for review.                       - Return to GI office PRN.                       - Resume Plavix (clopidogrel) at prior dose tomorrow.                        Refer to managing physician for  further adjustment of                        therapy.                       - The findings and recommendations were discussed with                        the patient and their spouse. Procedure Code(s):    --- Professional ---                       [id number], Colonoscopy, flexible; with removal of tumor(s),                        polyp(s), or other lesion(s) by snare technique Diagnosis Code(s):    --- Professional ---                       D12.3, Benign neoplasm of transverse colon (hepatic                        flexure or splenic flexure)                       Z12.11, Encounter for screening for malignant neoplasm                        of colon CPT copyright 2018 American Medical Association. All rights reserved. The codes documented in this report are preliminary and upon coder review may  be revised to meet current compliance requirements. Efrain Sella MD, MD 04/17/2018 8:30:16 AM This report has been signed electronically. Number of Addenda: 0 Note Initiated On: 04/17/2018 8:06 AM Scope Withdrawal Time: 0 hours 11 minutes 9 seconds  Total Procedure Duration: 0 hours 14 minutes 44 seconds       [facility]

## 2018-04-17 NOTE — Anesthesia Post-op Follow-up Note (Signed)
Anesthesia QCDR form completed.        

## 2018-04-18 LAB — SURGICAL PATHOLOGY

## 2018-05-06 ENCOUNTER — Telehealth: Payer: Self-pay

## 2018-05-06 NOTE — Telephone Encounter (Signed)
Tried reaching out to patient to make him aware that Dr. Rockey Situ is not seeing patients at this time.  Was going to offer a telephone or video visit with patient.  When calling patient received message " call can not be completed."

## 2018-05-08 NOTE — Telephone Encounter (Signed)
Tried to reach out to patient.  Patient will need to be changed to an EVISIT.  Number is no longer in service

## 2018-05-15 NOTE — Telephone Encounter (Signed)
Tried to contact patient.  Patient needs appointment changed to an Evisit.  When calling number listed in chart it started "call can not be completed as dialed" I have attempted x3 times to contact patient.  Will cancel appointment and place a recall in for August.

## 2018-05-29 ENCOUNTER — Ambulatory Visit: Payer: 59 | Admitting: Cardiovascular Disease

## 2018-08-01 ENCOUNTER — Encounter (INDEPENDENT_AMBULATORY_CARE_PROVIDER_SITE_OTHER): Payer: 59 | Admitting: Ophthalmology

## 2018-08-30 ENCOUNTER — Other Ambulatory Visit: Payer: Self-pay | Admitting: Cardiovascular Disease

## 2018-10-23 ENCOUNTER — Other Ambulatory Visit: Payer: Self-pay | Admitting: Cardiovascular Disease

## 2018-11-17 NOTE — Progress Notes (Deleted)
Cardiology Office Note  Date:  11/17/2018   ID:  Juan [name], Juan [name] [birth year], MRN [id number]  PCP:  [place] Pink, MD   No chief complaint on file.   HPI:  Juan [name] is a very pleasant 63 year old male with a history of  coronary artery disease,  multiple cardiac catheterizations with intervention dating back to 1994,  total of 8 stents,  Diabetes, hemoglobin A1c greater than 8  hyperlipidemia, difficult time tolerating numerous statins including Zetia (Tolerating simvastatin 40) Paroxysmal atrial fibrillation, post op CABG  presented to [facility] with angina, Cardiac catheterization showing three-vessel disease, referred to [facility] for CABG. 01/29/15 He presents today for follow-up of his coronary artery disease, CABG  Unstable angina on his last clinic visit Cardiac catheterization November 24, 2016 1.  Significant underlying three-vessel coronary artery disease with patent LIMA to LAD and patent SVG to OM1.  Occluded SVG to OM 2 and SVG to right PDA. 2.  Low normal LV systolic function with an EF of 50% with normal left ventricular end-diastolic pressure. 3.  Unsuccessful attempted angioplasty of the right coronary artery due to inability to advance the wire into the distal right coronary artery as the result of tortuosity and a bifurcation with a large RV marginal.  Reattempt at intervention of the RCA November 29, 2016 in [place] Successful complex angioplasty and drug-eluting stent placement to the mid right coronary artery. Very difficult procedure due to calcified vessel with difficulty advancing equipment.  The RV branch was jailed by the stent but had TIMI-3 flow.  Discussed other lab work with him Hemoglobin A1c 8.7 Using larger amounts of insulin  Total regular exercise program Denies any anginal symptoms Feels stent to the RCA was effective for his previous chest pain symptoms  Hemoglobin 8.7, total cholesterol 160,   EKG personally reviewed by myself  on todays visit Shows normal sinus rhythm rate 70 bpm T-wave abnormality anterolateral leads, seen on previous EKGs  Other past medical history reviewed No smoking history.   PMH:   has a past medical history of Childhood asthma, Coronary artery disease, Essential hypertension, GERD (gastroesophageal reflux disease), Heart murmur, History of kidney stones, Hyperlipidemia, Hypothyroidism, Mild sleep apnea, Myocardial infarction ([place]) (1996), and Type II diabetes mellitus ([place]).  PSH:    Past Surgical History:  Procedure Laterality Date  . [place]; Dr.Harold; [facility].  . CARDIAC CATHETERIZATION  2002   "total of 8 stents by now" (11/29/2016)  . CARDIAC CATHETERIZATION N/A 01/21/2015   Procedure: Left Heart Cath and Coronary Angiography;  Surgeon: Wellington Hampshire, MD;  Location: [place] [facility];  Service: Cardiovascular;  Laterality: N/A;  . CARDIAC CATHETERIZATION  11/24/2016   "couldn't get stent in this time" (11/29/2016)  . COLONOSCOPY WITH PROPOFOL N/A 04/17/2018   Procedure: COLONOSCOPY WITH PROPOFOL;  Surgeon: Toledo, Benay Pike, MD;  Location: [facility] ENDOS[facility];  Service: Gastroenterology;  Laterality: N/A;  . [place]; Dr. Joneen Boers; [employer]  . [place]   ""  . CORONARY ANGIOPLASTY WITH STENT PLACEMENT     ''''  . CORONARY ANGIOPLASTY WITH STENT PLACEMENT     '''  . CORONARY ANGIOPLASTY WITH STENT PLACEMENT     '''  . CORONARY ANGIOPLASTY WITH STENT PLACEMENT  2006   "2 stents for a total of 10 stents"; [facility]; [place]   . CORONARY ANGIOPLASTY WITH STENT PLACEMENT  11/29/2016   "  1 stent"  . CORONARY ARTERY BYPASS GRAFT N/A 01/29/2015   Procedure: CORONARY ARTERY BYPASS GRAFTING (CABG)x four using left internal mammary artery artery and bilateral saphenous thigh vein using endoscope.;  Surgeon: Ivin Poot, MD;  Location: [place];   Service: Open Heart Surgery;  Laterality: N/A;  . CORONARY STENT INTERVENTION N/A 11/24/2016   Procedure: CORONARY STENT INTERVENTION;  Surgeon: Wellington Hampshire, MD;  Location: [place] [facility];  Service: Cardiovascular;  Laterality: N/A;  . CORONARY STENT INTERVENTION N/A 11/29/2016   Procedure: CORONARY STENT INTERVENTION;  Surgeon: Wellington Hampshire, MD;  Location: [place] [facility];  Service: Cardiovascular;  Laterality: N/A;  . GANGLION CYST EXCISION Right   . LEFT HEART CATH AND CORONARY ANGIOGRAPHY Left 11/24/2016   Procedure: LEFT HEART CATH AND CORONARY ANGIOGRAPHY;  Surgeon: Wellington Hampshire, MD;  Location: [place] [facility];  Service: Cardiovascular;  Laterality: Left;  . NASAL SEPTUM SURGERY    . TEE WITHOUT CARDIOVERSION N/A 01/29/2015   Procedure: TRANSESOPHAGEAL ECHOCARDIOGRAM (TEE);  Surgeon: Ivin Poot, MD;  Location: [place];  Service: Open Heart Surgery;  Laterality: N/A;  . THYROIDECTOMY      Current Outpatient Medications  Medication Sig Dispense Refill  . AMBULATORY NON FORMULARY MEDICATION Patient has OSA or probable OSA (Yes or No): Yes Is the patient currently using CPAP within the home? (Yes or No): No If yes to question 2, what is the current DME provider?  Are there any changes to the order/settings? (if yes, please specify)  If no to question 2, date of sleep study: 12/19/2017 Date of face-to-face encounter: 12/03/2017 Settings: Auto-Cpap with pressure range of 5-15 cm H20; pt wants small unit for travel Signs and symptoms or probable OSA, mention all that apply (snoring) (morning headaches) (witnessed apneas) (choking) (gasping during sleep):  Resmed S/O *or* DreamStation air/auto with heated humidity.   Enroll in [employer] / [employer] Provide patient with SD card when [employer] enrollment expires CPAP supplies needed: mask of choice, headgear, cushions, filters, climate control tubing and water chamber.  PT WILL NEED APPOINTMENT IN OFFICE  WITH DME FOR CPAP SET 1 each 0  . aspirin EC 81 MG tablet Take 81 mg by mouth daily.    . clopidogrel (PLAVIX) 75 MG tablet Take 1 tablet (75 mg total) by mouth daily. 90 tablet 3  . Coenzyme Q10 (COQ10) 100 MG CAPS Take 100 mg by mouth daily.    . Continuous Blood Gluc Sensor MISC 1 each by Does not apply route as directed. Use as directed every 10 days. May dispense FreeStyle [employer] or similar. 3 each 0  . empagliflozin (JARDIANCE) 25 MG TABS tablet Take 25 mg by mouth daily.    . Insulin Glargine (LANTUS) 100 UNIT/ML Solostar Pen Inject 24 Units into the skin daily at 10 pm. (Patient taking differently: Inject 60 Units into the skin daily at 10 pm. ) 15 mL 11  . levothyroxine (SYNTHROID) 100 MCG tablet Take 1 tablet (100 mcg total) by mouth daily before breakfast. 90 tablet 0  . metFORMIN (GLUCOPHAGE-XR) 500 MG 24 hr tablet Take 1 tablet (500 mg total) by mouth 2 (two) times daily with a meal. (Patient taking differently: Take 1,000 mg by mouth 2 (two) times daily with a meal. ) 60 tablet 1  . metoprolol tartrate (LOPRESSOR) 100 MG tablet TAKE 1 TABLET TWICE A DAY 180 tablet 0  . Multiple Vitamin (MULTIVITAMIN WITH MINERALS) TABS tablet Take 1 tablet by mouth  2 (two) times daily.    . nitroGLYCERIN (NITROSTAT) 0.4 MG SL tablet Place 0.4 mg under the tongue every 5 (five) minutes as needed for chest pain.    . Omega-3 Fatty Acids (FISH OIL PO) Take 900 mg by mouth daily.     . ramipril (ALTACE) 10 MG capsule TAKE 1 CAPSULE DAILY 90 capsule 0  . simvastatin (ZOCOR) 40 MG tablet Take 1 tablet (40 mg total) by mouth every evening. 90 tablet 3   No current facility-administered medications for this visit.      Allergies:   Patient has no known allergies.   Social History:  The patient  reports that he has never smoked. He has never used smokeless tobacco. He reports current alcohol use. He reports that he does not use drugs.   Family History:   family history includes Cancer (age  of onset: 2) in his father; Cirrhosis in his sister; Drug abuse in his sister; Heart attack in his father and mother.    Review of Systems: Review of Systems  Constitutional: Negative.   Respiratory: Negative.   Cardiovascular: Negative.   Gastrointestinal: Negative.   Musculoskeletal: Negative.   Neurological: Negative.   Psychiatric/Behavioral: Negative.   All other systems reviewed and are negative.    PHYSICAL EXAM: VS:  There were no vitals taken for this visit. , BMI There is no height or weight on file to calculate BMI. GEN: Well nourished, well developed, in no acute distress  HEENT: normal  Neck: no JVD, carotid bruits, or masses Cardiac: RRR; no murmurs, rubs, or gallops,no edema  Respiratory:  clear to auscultation bilaterally, normal work of breathing GI: soft, nontender, nondistended, + BS MS: no deformity or atrophy  Skin: warm and dry, no rash Neuro:  Strength and sensation are intact Psych: euthymic mood, full affect     Recent Labs: 11/29/2017: BUN 20; Creatinine, Ser 0.86; Hemoglobin 16.4; Platelets 133; Potassium 4.1; Sodium 140    Lipid Panel Lab Results  Component Value Date   CHOL 197 02/16/2014   HDL 34.10 (L) 02/16/2014   LDLCALC 61 04/11/2013   TRIG 402.0 (H) 02/16/2014      Wt Readings from Last 3 Encounters:  04/17/18 200 lb (90.7 kg)  12/03/17 206 lb (93.4 kg)  11/29/17 207 lb (93.9 kg)       ASSESSMENT AND PLAN:  Coronary artery disease of native artery of native heart with stable angina pectoris (HCC) Recent cardiac catheterization x2,  Stressed importance of aggressive diabetes control Total cholesterol above goal Unable to tolerate alternate strategies for cholesterol management, tried other statins, tried Zetia with side effects  recommend we consider Repatha/praluent  Mixed hyperlipidemia As above, recommended more aggressive cholesterol management We will discuss with pharmacy to see if he is a candidate for  Repatha/praluent  Essential hypertension Blood pressure is well controlled on today's visit. No changes made to the medications.  Paroxysmal atrial fibrillation (HCC)  postoperatively he had brief period of atrial tachycardia/atrial fibrillation, started on amiodarone,  Maintaining normal sinus rhythm  S/P coronary artery stent placement PTCA with stent to the RCA, on aspirin Plavix Currently with anginal symptoms  Uncontrolled type 2 diabetes mellitus with complication, with long-term current use of insulin ([place]) Stressed importance of weight loss and low carbohydrate diet, exercise program Also working with endocrinology  S/P CABG (coronary artery bypass graft) Stable   Total encounter time more than 25 minutes  Greater than 50% was spent in counseling and coordination of care with the  patient   Disposition:   F/U  6 months   No orders of the defined types were placed in this encounter.    Signed, Esmond Plants, M.D., Ph.D. 11/17/2018  [place], [place]

## 2018-11-19 ENCOUNTER — Ambulatory Visit: Payer: 59 | Admitting: Cardiovascular Disease

## 2018-11-20 ENCOUNTER — Other Ambulatory Visit: Payer: Self-pay | Admitting: Cardiovascular Disease

## 2018-12-22 NOTE — Progress Notes (Signed)
Cardiology Office Note  Date:  12/23/2018   ID:  Juan [name], Juan [name] 03-Feb-1955, MRN [id number]  PCP:  Juan Pink, MD   Chief Complaint  Patient presents with  . other    12 month f/u c/o losing job and refused EKG due to cost . Meds reviewed verbally with pt.    HPI:  Juan [name] is a very pleasant 63 year old male with a history of  coronary artery disease,  multiple cardiac catheterizations with intervention dating back to 1994,  total of 8 stents,  Diabetes, hemoglobin A1c greater than 8  hyperlipidemia, difficult time tolerating numerous statins including Zetia (Tolerating simvastatin 40) Paroxysmal atrial fibrillation, post op CABG  [facility] angina, Cardiac catheterization showing three-vessel disease, referred to [facility] for CABG. 01/29/15 He presents today for follow-up of his coronary artery disease, CABG  Lab work reviewed HBA1C 5.6 down from 8.6 He has had dramatic weight loss as below  No anginal pain,  Active, walks, treadmill  Weight down since 01/2017: 30 pounds Off insulin  Lost job April 2020 Looking for another job  He declined EKG on today's visit  Cardiac catheterization November 24, 2016 reviewed with him 1.  Significant underlying three-vessel coronary artery disease with patent LIMA to LAD and patent SVG to OM1.  Occluded SVG to OM 2 and SVG to right PDA. 2.  Low normal LV systolic function with an EF of 50% with normal left ventricular end-diastolic pressure. 3.  Unsuccessful attempted angioplasty of the right coronary artery due to inability to advance the wire into the distal right coronary artery as the result of tortuosity and a bifurcation with a large RV marginal.  Reattempt at intervention of the RCA November 29, 2016 in [place] Successful complex angioplasty and drug-eluting stent placement to the mid right coronary artery. Very difficult procedure due to calcified vessel with difficulty advancing equipment.  The  RV branch was jailed by the stent but had TIMI-3 flow.   Other past medical history reviewed No smoking history.   PMH:   has a past medical history of Childhood asthma, Coronary artery disease, Essential hypertension, GERD (gastroesophageal reflux disease), Heart murmur, History of kidney stones, Hyperlipidemia, Hypothyroidism, Mild sleep apnea, Myocardial infarction (Juan [name]) (1996), and Type II diabetes mellitus (Juan [name]).  PSH:    Past Surgical History:  Procedure Laterality Date  . [place]; Juan [name]; [facility].  . CARDIAC CATHETERIZATION  2002   "total of 8 stents by now" (11/29/2016)  . CARDIAC CATHETERIZATION N/A 01/21/2015   Procedure: Left Heart Cath and Coronary Angiography;  Surgeon: Juan Hampshire, MD;  Location: [place] [facility];  Service: Cardiovascular;  Laterality: N/A;  . CARDIAC CATHETERIZATION  11/24/2016   "couldn't get stent in this time" (11/29/2016)  . COLONOSCOPY WITH PROPOFOL N/A 04/17/2018   Procedure: COLONOSCOPY WITH PROPOFOL;  Surgeon: Juan [name], Juan Pike, MD;  Location: [facility];  Service: Gastroenterology;  Laterality: N/A;  . [place]; Juan [name]; [employer]  . [place]   ""  . CORONARY ANGIOPLASTY WITH STENT PLACEMENT     ''''  . CORONARY ANGIOPLASTY WITH STENT PLACEMENT     '''  . CORONARY ANGIOPLASTY WITH STENT PLACEMENT     '''  . CORONARY ANGIOPLASTY WITH STENT PLACEMENT  2006   "2 stents for a total of 10 stents"; [facility]; [place]   . CORONARY ANGIOPLASTY WITH STENT  PLACEMENT  11/29/2016   "1 stent"  . CORONARY ARTERY BYPASS GRAFT N/A 01/29/2015   Procedure: CORONARY ARTERY BYPASS GRAFTING (CABG)x four using left internal mammary artery artery and bilateral saphenous thigh vein using endoscope.;  Surgeon: Juan Poot, MD;  Location: [facility];  Service: Open Heart Surgery;  Laterality: N/A;  . CORONARY  STENT INTERVENTION N/A 11/24/2016   Procedure: CORONARY STENT INTERVENTION;  Surgeon: Juan Hampshire, MD;  Location: [place] [facility];  Service: Cardiovascular;  Laterality: N/A;  . CORONARY STENT INTERVENTION N/A 11/29/2016   Procedure: CORONARY STENT INTERVENTION;  Surgeon: Juan Hampshire, MD;  Location: [place] [facility];  Service: Cardiovascular;  Laterality: N/A;  . GANGLION CYST EXCISION Right   . LEFT HEART CATH AND CORONARY ANGIOGRAPHY Left 11/24/2016   Procedure: LEFT HEART CATH AND CORONARY ANGIOGRAPHY;  Surgeon: Juan Hampshire, MD;  Location: [place] [facility];  Service: Cardiovascular;  Laterality: Left;  . NASAL SEPTUM SURGERY    . TEE WITHOUT CARDIOVERSION N/A 01/29/2015   Procedure: TRANSESOPHAGEAL ECHOCARDIOGRAM (TEE);  Surgeon: Juan Poot, MD;  Location: [place];  Service: Open Heart Surgery;  Laterality: N/A;  . THYROIDECTOMY      Current Outpatient Medications  Medication Sig Dispense Refill  . AMBULATORY NON FORMULARY MEDICATION Patient has OSA or probable OSA (Yes or No): Yes Is the patient currently using CPAP within the home? (Yes or No): No If yes to question 2, what is the current DME provider?  Are there any changes to the order/settings? (if yes, please specify)  If no to question 2, date of sleep study: 12/19/2017 Date of face-to-face encounter: 12/03/2017 Settings: Auto-Cpap with pressure range of 5-15 cm H20; pt wants small unit for travel Signs and symptoms or probable OSA, mention all that apply (snoring) (morning headaches) (witnessed apneas) (choking) (gasping during sleep):  Resmed S/O *or* DreamStation air/auto with heated humidity.   Enroll in [place] / [employer] Provide patient with SD card when [employer] enrollment expires CPAP supplies needed: mask of choice, headgear, cushions, filters, climate control tubing and water chamber.  PT WILL NEED APPOINTMENT IN OFFICE WITH DME FOR CPAP SET 1 each 0  . aspirin EC 81 MG tablet  Take 81 mg by mouth daily.    . clopidogrel (PLAVIX) 75 MG tablet Take 1 tablet (75 mg total) by mouth daily. 90 tablet 3  . Coenzyme Q10 (COQ10) 100 MG CAPS Take 100 mg by mouth daily.    . Continuous Blood Gluc Sensor MISC 1 each by Does not apply route as directed. Use as directed every 10 days. May dispense FreeStyle [employer] or similar. 3 each 0  . empagliflozin (JARDIANCE) 25 MG TABS tablet Take 25 mg by mouth daily.    Marland Kitchen levothyroxine (SYNTHROID) 100 MCG tablet Take 1 tablet (100 mcg total) by mouth daily before breakfast. 90 tablet 0  . metFORMIN (GLUCOPHAGE-XR) 500 MG 24 hr tablet Take 1 tablet (500 mg total) by mouth 2 (two) times daily with a meal. (Patient taking differently: Take 1,000 mg by mouth 2 (two) times daily with a meal. ) 60 tablet 1  . metoprolol tartrate (LOPRESSOR) 100 MG tablet TAKE 1 TABLET TWICE A DAY 180 tablet 0  . Multiple Vitamin (MULTIVITAMIN WITH MINERALS) TABS tablet Take 1 tablet by mouth 2 (two) times daily.    . nitroGLYCERIN (NITROSTAT) 0.4 MG SL tablet Place 0.4 mg under the tongue every 5 (five) minutes as needed for chest pain.    Marland Kitchen  Omega-3 Fatty Acids (FISH OIL PO) Take 900 mg by mouth daily.     . ramipril (ALTACE) 10 MG capsule TAKE 1 CAPSULE DAILY 90 capsule 0  . simvastatin (ZOCOR) 40 MG tablet Take 1 tablet (40 mg total) by mouth every evening. 90 tablet 3   No current facility-administered medications for this visit.      Allergies:   Patient has no known allergies.   Social History:  The patient  reports that he has never smoked. He has never used smokeless tobacco. He reports current alcohol use. He reports that he does not use drugs.   Family History:   family history includes Cancer (age of onset: 18) in his father; Cirrhosis in his sister; Drug abuse in his sister; Heart attack in his father and mother.    Review of Systems: Review of Systems  Constitutional: Negative.   HENT: Negative.   Respiratory: Negative.    Cardiovascular: Negative.   Gastrointestinal: Negative.   Musculoskeletal: Negative.   Neurological: Negative.   Psychiatric/Behavioral: Negative.   All other systems reviewed and are negative.   PHYSICAL EXAM: VS:  BP 130/70 (BP Location: Left Arm, Patient Position: Sitting, Cuff Size: Normal)   Pulse 73   Ht 5\' 8"  (1.727 m)   Wt 183 lb 8 oz (83.2 kg)   SpO2 97%   BMI 27.90 kg/m  , BMI Body mass index is 27.9 kg/m. Constitutional:  oriented to person, place, and time. No distress.  HENT:  Head: Grossly normal Eyes:  no discharge. No scleral icterus.  Neck: No JVD, no carotid bruits  Cardiovascular: Regular rate and rhythm, no murmurs appreciated Pulmonary/Chest: Clear to auscultation bilaterally, no wheezes or rails Abdominal: Soft.  no distension.  no tenderness.  Musculoskeletal: Normal range of motion Neurological:  normal muscle tone. Coordination normal. No atrophy Skin: Skin warm and dry Psychiatric: normal affect, pleasant   Recent Labs: No results found for requested labs within last 8760 hours.    Lipid Panel Lab Results  Component Value Date   CHOL 197 02/16/2014   HDL 34.10 (L) 02/16/2014   LDLCALC 61 04/11/2013   TRIG 402.0 (H) 02/16/2014    Wt Readings from Last 3 Encounters:  12/23/18 183 lb 8 oz (83.2 kg)  04/17/18 200 lb (90.7 kg)  12/03/17 206 lb (93.4 kg)      ASSESSMENT AND PLAN:  Coronary artery disease of native artery of native heart with stable angina pectoris (HCC) Currently with no symptoms of angina. No further workup at this time. Continue current medication regimen.  Stable  Mixed hyperlipidemia Numbers at goal Dramatic weight loss over the past year or 2  Essential hypertension Blood pressure is well controlled on today's visit. No changes made to the medications. Stable.  Improved after weight loss  Paroxysmal atrial fibrillation (HCC)  postoperatively he had brief period of atrial tachycardia/atrial fibrillation,  started on amiodarone,  Normal sinus rhythm, no further work-up needed  S/P coronary artery stent placement PTCA with stent to the RCA, on aspirin No anginal symptoms, exercising on a regular basis  Uncontrolled type 2 diabetes mellitus with complication, with long-term current use of insulin (HCC) Dramatic improvement in his weight, A1c   S/P CABG (coronary artery bypass graft) Stable   Total encounter time more than 25 minutes  Greater than 50% was spent in counseling and coordination of care with the patient   Disposition:   F/U  12 months   No orders of the defined types were placed  in this encounter.    Signed, Esmond Plants, M.D., Ph.D. 12/23/2018  [place], [place]

## 2018-12-23 ENCOUNTER — Other Ambulatory Visit: Payer: Self-pay

## 2018-12-23 ENCOUNTER — Ambulatory Visit (INDEPENDENT_AMBULATORY_CARE_PROVIDER_SITE_OTHER): Payer: 59 | Admitting: Cardiovascular Disease

## 2018-12-23 ENCOUNTER — Encounter: Payer: Self-pay | Admitting: Cardiovascular Disease

## 2018-12-23 VITALS — BP 130/70 | HR 73 | Ht 68.0 in | Wt 183.5 lb

## 2018-12-23 DIAGNOSIS — Z951 Presence of aortocoronary bypass graft: Secondary | ICD-10-CM | POA: Diagnosis not present

## 2018-12-23 DIAGNOSIS — I25118 Atherosclerotic heart disease of native coronary artery with other forms of angina pectoris: Secondary | ICD-10-CM | POA: Diagnosis not present

## 2018-12-23 DIAGNOSIS — I10 Essential (primary) hypertension: Secondary | ICD-10-CM | POA: Diagnosis not present

## 2018-12-23 DIAGNOSIS — I480 Paroxysmal atrial fibrillation: Secondary | ICD-10-CM

## 2018-12-23 DIAGNOSIS — E782 Mixed hyperlipidemia: Secondary | ICD-10-CM | POA: Diagnosis not present

## 2018-12-23 NOTE — Patient Instructions (Signed)

## 2019-01-23 ENCOUNTER — Ambulatory Visit: Payer: 59 | Attending: Internal Medicine

## 2019-01-23 DIAGNOSIS — Z20822 Contact with and (suspected) exposure to covid-19: Secondary | ICD-10-CM

## 2019-01-24 LAB — NOVEL CORONAVIRUS, NAA: SARS-CoV-2, NAA: NOT DETECTED

## 2019-02-09 ENCOUNTER — Other Ambulatory Visit: Payer: Self-pay | Admitting: Cardiovascular Disease

## 2019-10-07 ENCOUNTER — Other Ambulatory Visit: Payer: Self-pay | Admitting: Cardiovascular Disease

## 2019-10-07 MED ORDER — RAMIPRIL 10 MG PO CAPS: 10.0000 mg | ORAL_CAPSULE | Freq: Every day | ORAL | 0 refills | Status: DC

## 2019-10-07 MED ORDER — METOPROLOL TARTRATE 100 MG PO TABS: 100.0000 mg | ORAL_TABLET | Freq: Two times a day (BID) | ORAL | 0 refills | Status: DC

## 2019-10-07 NOTE — Telephone Encounter (Signed)
Requested Prescriptions   Signed Prescriptions Disp Refills   ramipril (ALTACE) 10 MG capsule 90 capsule 0    Sig: Take 1 capsule (10 mg total) by mouth daily.    Authorizing Provider: Minna Merritts    Ordering User: Raelene Bott, Shauntell Iglesia L   metoprolol tartrate (LOPRESSOR) 100 MG tablet 180 tablet 0    Sig: Take 1 tablet (100 mg total) by mouth 2 (two) times daily.    Authorizing Provider: Minna Merritts    Ordering User: Raelene Bott, Adair Lauderback L

## 2019-10-07 NOTE — Telephone Encounter (Signed)
*  STAT* If patient is at the pharmacy, call can be transferred to refill team.   1. Which medications need to be refilled? (please list name of each medication and dose if known) ramipril 10 mg, metoprolol  Tartrate 100 mg bid  2. Which pharmacy/location (including street and city if local pharmacy) [facility]  3. Do they need a 30 day or 90 day supply? 90 day with no refills

## 2019-12-29 ENCOUNTER — Ambulatory Visit: Payer: 59 | Admitting: Cardiovascular Disease

## 2020-02-04 ENCOUNTER — Other Ambulatory Visit: Payer: Self-pay

## 2020-02-04 MED ORDER — METOPROLOL TARTRATE 100 MG PO TABS: 100.0000 mg | ORAL_TABLET | Freq: Two times a day (BID) | ORAL | 0 refills | Status: DC

## 2020-02-04 MED ORDER — NITROGLYCERIN 0.4 MG SL SUBL: 0.4000 mg | SUBLINGUAL_TABLET | SUBLINGUAL | 0 refills | Status: DC | PRN

## 2020-02-04 MED ORDER — SIMVASTATIN 40 MG PO TABS: 40.0000 mg | ORAL_TABLET | Freq: Every evening | ORAL | 0 refills | Status: DC

## 2020-02-04 MED ORDER — CLOPIDOGREL BISULFATE 75 MG PO TABS: 75.0000 mg | ORAL_TABLET | Freq: Every day | ORAL | 0 refills | Status: DC

## 2020-02-04 MED ORDER — RAMIPRIL 10 MG PO CAPS: 10.0000 mg | ORAL_CAPSULE | Freq: Every day | ORAL | 0 refills | Status: DC

## 2020-02-19 NOTE — Progress Notes (Signed)
Cardiology Office Note  Date:  02/20/2020   ID:  Juan [name], Juan [name] 08-29-1955, MRN [id number]  PCP:  Maryland Pink, MD   Chief Complaint  Patient presents with   Follow-up    12 month F/U    HPI:  Mr. Cupit is a very pleasant 65 year old male with a history of  coronary artery disease,  multiple cardiac catheterizations with intervention dating back to 1994,  total of 8 stents,  Diabetes, hemoglobin A1c6 hyperlipidemia, difficult time tolerating numerous statins including Zetia (Tolerating simvastatin 40) Paroxysmal atrial fibrillation, post op CABG  presented to [facility] with angina, Cardiac catheterization showing three-vessel disease, referred to [facility] for CABG. 01/29/15 He presents today for follow-up of his coronary artery disease, CABG  LOV 12/23/2018  Active on treadmill, 3 miles No chest pain concerning for angina  Lab work reviewed HBA1C 6.7  Cholesterol running high around 200  Reports compliance with simvastatin 40 daily Last 3 lipid panel checks have been well above goal, done through primary care  Off insulin Non-smoker  EKG personally reviewed by myself on todays visit Shows normal sinus rhythm rate 65 bpm nonspecific ST-T wave abnormality, no change from prior EKGs  Cardiac catheterization November 24, 2016  1.  Significant underlying three-vessel coronary artery disease with patent LIMA to LAD and patent SVG to OM1.  Occluded SVG to OM 2 and SVG to right PDA. 2.  Low normal LV systolic function with an EF of 50% with normal left ventricular end-diastolic pressure. 3.  Unsuccessful attempted angioplasty of the right coronary artery due to inability to advance the wire into the distal right coronary artery as the result of tortuosity and a bifurcation with a large RV marginal.  Reattempt at intervention of the RCA November 29, 2016 in [place] Successful complex angioplasty and drug-eluting stent placement to the mid right coronary  artery. Very difficult procedure due to calcified vessel with difficulty advancing equipment.  The RV branch was jailed by the stent but had TIMI-3 flow.   PMH:   has a past medical history of Childhood asthma, Coronary artery disease, Essential hypertension, GERD (gastroesophageal reflux disease), Heart murmur, History of kidney stones, Hyperlipidemia, Hypothyroidism, Mild sleep apnea, Myocardial infarction ([place]) (1996), and Type II diabetes mellitus ([place]).  PSH:    Past Surgical History:  Procedure Laterality Date   [place]; Dr.Harold; [place]  2002   "total of 8 stents by now" (11/29/2016)   CARDIAC CATHETERIZATION N/A 01/21/2015   Procedure: Left Heart Cath and Coronary Angiography;  Surgeon: Wellington Hampshire, MD;  Location: [place] [facility];  Service: Cardiovascular;  Laterality: N/A;   CARDIAC CATHETERIZATION  11/24/2016   "couldn't get stent in this time" (11/29/2016)   COLONOSCOPY WITH PROPOFOL N/A 04/17/2018   Procedure: COLONOSCOPY WITH PROPOFOL;  Surgeon: Tol[place], Benay Pike, MD;  Location: [facility];  Service: Gastroenterology;  Laterality: N/A;   [place]; Dr. Joneen Boers; [place] WITH STENT PLACEMENT  1996   ""   CORONARY ANGIOPLASTY WITH STENT PLACEMENT     ''''   CORONARY ANGIOPLASTY WITH STENT PLACEMENT     '''   CORONARY ANGIOPLASTY WITH STENT PLACEMENT     '''   CORONARY ANGIOPLASTY WITH STENT PLACEMENT  2006   "2 stents for a total of 10 stents"; [facility]; [place]    CORONARY ANGIOPLASTY WITH STENT PLACEMENT  11/29/2016   "  1 stent"   CORONARY ARTERY BYPASS GRAFT N/A 01/29/2015   Procedure: CORONARY ARTERY BYPASS GRAFTING (CABG)x four using left internal mammary artery artery and bilateral saphenous thigh vein using endoscope.;  Surgeon: Ivin Poot, MD;  Location: [place];  Service: Open Heart  Surgery;  Laterality: N/A;   CORONARY STENT INTERVENTION N/A 11/24/2016   Procedure: CORONARY STENT INTERVENTION;  Surgeon: Wellington Hampshire, MD;  Location: [place] [facility];  Service: Cardiovascular;  Laterality: N/A;   CORONARY STENT INTERVENTION N/A 11/29/2016   Procedure: CORONARY STENT INTERVENTION;  Surgeon: Wellington Hampshire, MD;  Location: [place] [facility];  Service: Cardiovascular;  Laterality: N/A;   GANGLION CYST EXCISION Right    LEFT HEART CATH AND CORONARY ANGIOGRAPHY Left 11/24/2016   Procedure: LEFT HEART CATH AND CORONARY ANGIOGRAPHY;  Surgeon: Wellington Hampshire, MD;  Location: [place] [facility];  Service: Cardiovascular;  Laterality: Left;   NASAL SEPTUM SURGERY     TEE WITHOUT CARDIOVERSION N/A 01/29/2015   Procedure: TRANSESOPHAGEAL ECHOCARDIOGRAM (TEE);  Surgeon: Ivin Poot, MD;  Location: [place];  Service: Open Heart Surgery;  Laterality: N/A;   THYROIDECTOMY      Current Outpatient Medications  Medication Sig Dispense Refill   Ascorbic Acid (VITAMIN C PO) Take 2 tablets by mouth daily.     aspirin EC 81 MG tablet Take 81 mg by mouth daily.     Coenzyme Q10 (COQ10) 100 MG CAPS Take 100 mg by mouth daily.     Continuous Blood Gluc Sensor MISC 1 each by Does not apply route as directed. Use as directed every 10 days. May dispense FreeStyle [employer] or similar. 3 each 0   CYANOCOBALAMIN PO Take by mouth daily.     empagliflozin (JARDIANCE) 25 MG TABS tablet Take 25 mg by mouth daily.     levothyroxine (SYNTHROID) 100 MCG tablet Take 1 tablet (100 mcg total) by mouth daily before breakfast. 90 tablet 0   metFORMIN (GLUCOPHAGE-XR) 500 MG 24 hr tablet Take 1,000 mg by mouth 2 (two) times daily.     Multiple Vitamin (MULTIVITAMIN WITH MINERALS) TABS tablet Take 1 tablet by mouth 2 (two) times daily.     Omega-3 Fatty Acids (FISH OIL PO) Take 900 mg by mouth daily.      clopidogrel (PLAVIX) 75 MG tablet Take 1 tablet (75 mg total) by  mouth daily. 90 tablet 0   metoprolol tartrate (LOPRESSOR) 100 MG tablet Take 1 tablet (100 mg total) by mouth 2 (two) times daily. 180 tablet 0   nitroGLYCERIN (NITROSTAT) 0.4 MG SL tablet Place 1 tablet (0.4 mg total) under the tongue every 5 (five) minutes as needed for chest pain. 25 tablet 0   ramipril (ALTACE) 10 MG capsule Take 1 capsule (10 mg total) by mouth daily. 90 capsule 0   simvastatin (ZOCOR) 40 MG tablet Take 1 tablet (40 mg total) by mouth every evening. 90 tablet 0   No current facility-administered medications for this visit.    Allergies:   Patient has no known allergies.   Social History:  The patient  reports that he has been smoking cigars. He has never used smokeless tobacco. He reports current alcohol use. He reports that he does not use drugs.   Family History:   family history includes Cancer (age of onset: 31) in his father; Cirrhosis in his sister; Drug abuse in his sister; Emphysema in his brother; Heart attack in his father and mother.    Review  of Systems: Review of Systems  Constitutional: Negative.   HENT: Negative.   Respiratory: Negative.   Cardiovascular: Negative.   Gastrointestinal: Negative.   Musculoskeletal: Negative.   Neurological: Negative.   Psychiatric/Behavioral: Negative.   All other systems reviewed and are negative.   PHYSICAL EXAM: VS:  BP 130/76 (BP Location: Left Arm, Patient Position: Sitting, Cuff Size: Large)    Pulse 65    Ht 5\' 8"  (1.727 m)    Wt 195 lb (88.5 kg)    SpO2 98%    BMI 29.65 kg/m  , BMI Body mass index is 29.65 kg/m. Constitutional:  oriented to person, place, and time. No distress.  HENT:  Head: Grossly normal Eyes:  no discharge. No scleral icterus.  Neck: No JVD, no carotid bruits  Cardiovascular: Regular rate and rhythm, no murmurs appreciated Pulmonary/Chest: Clear to auscultation bilaterally, no wheezes or rails Abdominal: Soft.  no distension.  no tenderness.  Musculoskeletal: Normal range of  motion Neurological:  normal muscle tone. Coordination normal. No atrophy Skin: Skin warm and dry Psychiatric: normal affect, pleasant   Recent Labs: No results found for requested labs within last 8760 hours.    Lipid Panel Lab Results  Component Value Date   CHOL 197 02/16/2014   HDL 34.10 (L) 02/16/2014   LDLCALC 61 04/11/2013   TRIG 402.0 (H) 02/16/2014    Wt Readings from Last 3 Encounters:  02/20/20 195 lb (88.5 kg)  12/23/18 183 lb 8 oz (83.2 kg)  04/17/18 200 lb (90.7 kg)     ASSESSMENT AND PLAN:  Coronary artery disease of native artery of native heart with stable angina pectoris (HCC) Currently with no symptoms of angina. No further workup at this time. Continue current medication regimen.  Mixed hyperlipidemia Numbers are above goal Reports compliance with simvastatin but total cholesterol 200 Was previously 150 two years ago Recommend he add Zetia 10 mg daily with simvastatin 40 Recheck lipid panel in 2 to 3 months If numbers continue to run high may need PCSK9 inhibitor, this was discussed in detail Prefer not to add additional medications but he is going to try Zetia  Essential hypertension Recommend weight loss, close monitoring of blood pressure, no changes made to medications  Paroxysmal atrial fibrillation (HCC)  postoperatively he had brief period of atrial tachycardia/atrial fibrillation, started on amiodarone,  Maintaining normal sinus rhythm  S/P coronary artery stent placement PTCA with stent to the RCA, on aspirin Denies angina with exercise  Uncontrolled type 2 diabetes mellitus with complication, with long-term current use of insulin (HCC) A1c trending back upwards, recommended weight loss, walking program Need more aggressive lipid management  S/P CABG (coronary artery bypass graft) Stable Denies anginal symptoms, discussed the importance of aggressive diabetes and lipid control   Total encounter time more than 25 minutes  Greater  than 50% was spent in counseling and coordination of care with the patient   Orders Placed This Encounter  Procedures   EKG 12-Lead     Signed, Esmond Plants, M.D., Ph.D. 02/20/2020  [place], [place]

## 2020-02-20 ENCOUNTER — Encounter: Payer: Self-pay | Admitting: Cardiovascular Disease

## 2020-02-20 ENCOUNTER — Ambulatory Visit (INDEPENDENT_AMBULATORY_CARE_PROVIDER_SITE_OTHER): Payer: 59 | Admitting: Cardiovascular Disease

## 2020-02-20 ENCOUNTER — Other Ambulatory Visit: Payer: Self-pay

## 2020-02-20 VITALS — BP 130/76 | HR 65 | Ht 68.0 in | Wt 195.0 lb

## 2020-02-20 DIAGNOSIS — E782 Mixed hyperlipidemia: Secondary | ICD-10-CM | POA: Diagnosis not present

## 2020-02-20 DIAGNOSIS — Z951 Presence of aortocoronary bypass graft: Secondary | ICD-10-CM

## 2020-02-20 DIAGNOSIS — I25118 Atherosclerotic heart disease of native coronary artery with other forms of angina pectoris: Secondary | ICD-10-CM

## 2020-02-20 DIAGNOSIS — I10 Essential (primary) hypertension: Secondary | ICD-10-CM | POA: Diagnosis not present

## 2020-02-20 DIAGNOSIS — I480 Paroxysmal atrial fibrillation: Secondary | ICD-10-CM

## 2020-02-20 MED ORDER — CLOPIDOGREL BISULFATE 75 MG PO TABS: 75.0000 mg | ORAL_TABLET | Freq: Every day | ORAL | 0 refills | Status: DC

## 2020-02-20 MED ORDER — EZETIMIBE 10 MG PO TABS: 10.0000 mg | ORAL_TABLET | Freq: Every day | ORAL | 3 refills | Status: DC

## 2020-02-20 MED ORDER — METOPROLOL TARTRATE 100 MG PO TABS: 100.0000 mg | ORAL_TABLET | Freq: Two times a day (BID) | ORAL | 0 refills | Status: DC

## 2020-02-20 MED ORDER — NITROGLYCERIN 0.4 MG SL SUBL: 0.4000 mg | SUBLINGUAL_TABLET | SUBLINGUAL | 0 refills | Status: DC | PRN

## 2020-02-20 MED ORDER — SIMVASTATIN 40 MG PO TABS: 40.0000 mg | ORAL_TABLET | Freq: Every evening | ORAL | 0 refills | Status: DC

## 2020-02-20 MED ORDER — RAMIPRIL 10 MG PO CAPS: 10.0000 mg | ORAL_CAPSULE | Freq: Every day | ORAL | 0 refills | Status: DC

## 2020-02-20 NOTE — Patient Instructions (Addendum)
Medication Instructions:  Please start Zetia 10 mg, take one tab by mouth once a day.   All your cardiac medications have been updated for 90 day supply for one year, please call your pharmacy when refills are needed, they may be picked up today if needed.   Lab work: Lipids in 2 to 3 months by your primary MD, they can fax the results to our office with your permission.   Testing/Procedures: No new testing needed   Follow-Up: At [facility], you and your health needs are our priority.  As part of our continuing mission to provide you with exceptional heart care, we have created designated Provider Care Teams.  These Care Teams include your primary Cardiologist (physician) and Advanced Practice Providers (APPs -  Physician Assistants and Nurse Practitioners) who all work together to provide you with the care you need, when you need it.   You will need a follow up appointment in 12 months   Providers on your designated Care Team:    Murray Hodgkins, NP  Christell Faith, PA-C  Marrianne Mood, PA-C  Any Other Special Instructions Will Be Listed Below (If Applicable).  COVID-19 Vaccine Information can be found at: ShippingScam.co.uk For questions related to vaccine distribution or appointments, please email vaccine@ [email or web address] or call [phone].

## 2020-02-27 ENCOUNTER — Ambulatory Visit: Payer: 59 | Admitting: Cardiovascular Disease

## 2020-05-25 ENCOUNTER — Other Ambulatory Visit: Payer: Self-pay | Admitting: Cardiovascular Disease

## 2020-05-25 NOTE — Telephone Encounter (Signed)
Rx request sent to pharmacy.  

## 2020-08-13 ENCOUNTER — Other Ambulatory Visit: Payer: Self-pay | Admitting: Cardiovascular Disease

## 2020-11-27 ENCOUNTER — Other Ambulatory Visit: Payer: Self-pay | Admitting: Cardiovascular Disease

## 2021-01-04 ENCOUNTER — Telehealth: Payer: Self-pay | Admitting: Cardiovascular Disease

## 2021-01-04 NOTE — Telephone Encounter (Signed)
11/27 positive covid  No new or worsening symptoms no fever at this time.  Patient declined virtual .  Ok to be seen in office per [employer] .    Patient aware to wear a mask and call if overnight developes fever or worsening symptoms

## 2021-01-04 NOTE — Telephone Encounter (Signed)
    COVID-19 Pre-Screening Questions V2.:  In the past 7 to 10 days have you had a cough,  shortness of breath, headache, congestion, fever (100 or greater) body aches, chills, sore throat, or sudden loss of taste or sense of smell,? Have you been around anyone with known Covid 19, or who is waiting for a Covid test result and is symptomatic?   For patients who are Covid+, pending results or exposed in the last 5 days WITH SYMPTOMS:  1.       Offer to change appointment to a [facility] appointment. If the patient declines, contact covering nurse or triage so it can be determined if patient needs to be seen or     rescheduled. (assumes patient calls ahead)  2.       If a patient presents in the lobby, ask them to return to their car and the nurse will call them. Obtain the correct cell phone number and message the covering nurse. The nurse will triage the patient and discuss with the provider to determine appropriate visit plan (In office, MyChart or reschedule).   3.       If it is determined the patient needs to be seen in the office, arrangements will be made for the patient to be seen in a remote room with minimal touches. (Location will be         specific to each [facility] site).               If you have any concerns/questions about symptoms patients report during screening (either on the phone or at threshold),                Send a secure chat with the patient's chart to the APP doing preop clearances for that day.              If the decision is made to see the patient, document the following in the appt. note:  "cleared by APP's initials".                        If an APP is not available contact a member of the leadership team.   Patients who are Covid+ are allowed in the office when the following are met: No symptoms or improving symptoms  At least 5 days post positive test

## 2021-01-05 ENCOUNTER — Ambulatory Visit (INDEPENDENT_AMBULATORY_CARE_PROVIDER_SITE_OTHER): Payer: 59 | Admitting: Cardiovascular Disease

## 2021-01-05 ENCOUNTER — Encounter: Payer: Self-pay | Admitting: Cardiovascular Disease

## 2021-01-05 ENCOUNTER — Other Ambulatory Visit: Payer: Self-pay

## 2021-01-05 VITALS — BP 130/72 | HR 67 | Ht 68.0 in | Wt 183.0 lb

## 2021-01-05 DIAGNOSIS — Z951 Presence of aortocoronary bypass graft: Secondary | ICD-10-CM

## 2021-01-05 DIAGNOSIS — I25118 Atherosclerotic heart disease of native coronary artery with other forms of angina pectoris: Secondary | ICD-10-CM

## 2021-01-05 DIAGNOSIS — E782 Mixed hyperlipidemia: Secondary | ICD-10-CM

## 2021-01-05 DIAGNOSIS — I480 Paroxysmal atrial fibrillation: Secondary | ICD-10-CM

## 2021-01-05 DIAGNOSIS — I10 Essential (primary) hypertension: Secondary | ICD-10-CM

## 2021-01-05 MED ORDER — SIMVASTATIN 40 MG PO TABS
40.0000 mg | ORAL_TABLET | Freq: Every evening | ORAL | 3 refills | Status: DC
Start: 2021-01-05 — End: 2021-02-08

## 2021-01-05 MED ORDER — NITROGLYCERIN 0.4 MG SL SUBL: 0.4000 mg | SUBLINGUAL_TABLET | SUBLINGUAL | 1 refills | Status: DC | PRN

## 2021-01-05 MED ORDER — METOPROLOL TARTRATE 100 MG PO TABS: 100.0000 mg | ORAL_TABLET | Freq: Two times a day (BID) | ORAL | 3 refills | Status: DC

## 2021-01-05 MED ORDER — RAMIPRIL 10 MG PO CAPS: 10.0000 mg | ORAL_CAPSULE | Freq: Every day | ORAL | 3 refills | Status: DC

## 2021-01-05 MED ORDER — CLOPIDOGREL BISULFATE 75 MG PO TABS: 75.0000 mg | ORAL_TABLET | Freq: Every day | ORAL | 3 refills | Status: DC

## 2021-01-05 NOTE — Patient Instructions (Addendum)
Goal LDL <60 Goal total chol <140 If numbers with Kary Kos are high, call the office We could start repatha/praluent (injection), every 2 weeks  Medication Instructions:  No changes  If you need a refill on your cardiac medications before your next appointment, please call your pharmacy.   Lab work: No new labs needed  Testing/Procedures: No new testing needed  Follow-Up: At [facility], you and your health needs are our priority.  As part of our continuing mission to provide you with exceptional heart care, we have created designated Provider Care Teams.  These Care Teams include your primary Cardiologist (physician) and Advanced Practice Providers (APPs -  Physician Assistants and Nurse Practitioners) who all work together to provide you with the care you need, when you need it.  You will need a follow up appointment in 12 months  Providers on your designated Care Team:   Murray Hodgkins, NP Christell Faith, PA-C Cadence Kathlen Mody, [place]  COVID-19 Vaccine Information can be found at: ShippingScam.co.uk For questions related to vaccine distribution or appointments, please email [email or web address] or call [phone].

## 2021-01-05 NOTE — Progress Notes (Signed)
Cardiology Office Note  Date:  01/05/2021   ID:  Juan [name], Juan [name] 1955-02-10, MRN [id number]  PCP:  Maryland Pink, MD   Chief Complaint  Patient presents with   Follow-up  Of his CAD  HPI:  Mr. Juan [name] is a very pleasant 65 year old male with a history of  coronary artery disease,  multiple cardiac catheterizations with intervention dating back to 1994,  total of 8 stents,  Diabetes, hemoglobin A1c6 hyperlipidemia, difficult time tolerating numerous statins including Zetia (Tolerating simvastatin 40) Paroxysmal atrial fibrillation, post op CABG  presented to  [facility] with angina,  Cardiac catheterization showing three-vessel disease,  referred to [facility] for CABG. 01/29/15 He presents today for follow-up of his coronary artery disease, CABG  LOV January 2022 Tested positive for COVID December 26, 2020 Symptoms started November 25 Mostly recovered  Has been doing lots of traveling  Zetia side effects diarrhea Has stayed on simvastatin, could not tolerate other statins  Lab work reviewed A1C 6.7 Prior cholesterol above goal, total cholesterol 190  In the past has been active, uses his treadmill Denies chest pain or shortness of breath concerning for angina  EKG personally reviewed by myself on todays visit Shows normal sinus rhythm rate 63 bpm nonspecific ST-T wave abnormality, no change from prior EKGs  Cardiac catheterization November 24, 2016  1.  Significant underlying three-vessel coronary artery disease with patent LIMA to LAD and patent SVG to OM1.  Occluded SVG to OM 2 and SVG to right PDA. 2.  Low normal LV systolic function with an EF of 50% with normal left ventricular end-diastolic pressure. 3.  Unsuccessful attempted angioplasty of the right coronary artery due to inability to advance the wire into the distal right coronary artery as the result of tortuosity and a bifurcation with a large RV marginal.   Reattempt at intervention of the RCA November 29, 2016 in [place] Successful complex angioplasty and drug-eluting stent placement to the mid right coronary artery. Very difficult procedure due to calcified vessel with difficulty advancing equipment.  The RV branch was jailed by the stent but had TIMI-3 flow.    PMH:   has a past medical history of Childhood asthma, Coronary artery disease, Essential hypertension, GERD (gastroesophageal reflux disease), Heart murmur, History of kidney stones, Hyperlipidemia, Hypothyroidism, Mild sleep apnea, Myocardial infarction ([place]) (1996), and Type II diabetes mellitus ([place]).  PSH:    Past Surgical History:  Procedure Laterality Date   [place]; Dr.Harold; [place]  2002   "total of 8 stents by now" (11/29/2016)   CARDIAC CATHETERIZATION N/A 01/21/2015   Procedure: Left Heart Cath and Coronary Angiography;  Surgeon: Wellington Hampshire, MD;  Location: [place] CV LAB;  Service: Cardiovascular;  Laterality: N/A;   CARDIAC CATHETERIZATION  11/24/2016   "couldn't get stent in this time" (11/29/2016)   COLONOSCOPY WITH PROPOFOL N/A 04/17/2018   Procedure: COLONOSCOPY WITH PROPOFOL;  Surgeon: [place], Benay Pike, MD;  Location: [facility] ENDOSCOPY;  Service: Gastroenterology;  Laterality: N/A;   [place]; Dr. Joneen Boers; [place] WITH STENT PLACEMENT  1996   ""   CORONARY ANGIOPLASTY WITH STENT PLACEMENT     ''''   CORONARY ANGIOPLASTY WITH STENT PLACEMENT     '''   CORONARY ANGIOPLASTY WITH STENT PLACEMENT     '''   CORONARY ANGIOPLASTY WITH STENT PLACEMENT  2006   "2 stents for  a total of 10 stents"; [facility]; [place]    CORONARY ANGIOPLASTY WITH STENT PLACEMENT  11/29/2016   "1 stent"   CORONARY ARTERY BYPASS GRAFT N/A 01/29/2015   Procedure: CORONARY ARTERY BYPASS GRAFTING (CABG)x four using left internal mammary artery artery and bilateral saphenous  thigh vein using endoscope.;  Surgeon: Ivin Poot, MD;  Location: [place];  Service: Open Heart Surgery;  Laterality: N/A;   CORONARY STENT INTERVENTION N/A 11/24/2016   Procedure: CORONARY STENT INTERVENTION;  Surgeon: Wellington Hampshire, MD;  Location: [place] CV LAB;  Service: Cardiovascular;  Laterality: N/A;   CORONARY STENT INTERVENTION N/A 11/29/2016   Procedure: CORONARY STENT INTERVENTION;  Surgeon: Wellington Hampshire, MD;  Location: [place] CV LAB;  Service: Cardiovascular;  Laterality: N/A;   GANGLION CYST EXCISION Right    LEFT HEART CATH AND CORONARY ANGIOGRAPHY Left 11/24/2016   Procedure: LEFT HEART CATH AND CORONARY ANGIOGRAPHY;  Surgeon: Wellington Hampshire, MD;  Location: [place] CV LAB;  Service: Cardiovascular;  Laterality: Left;   NASAL SEPTUM SURGERY     TEE WITHOUT CARDIOVERSION N/A 01/29/2015   Procedure: TRANSESOPHAGEAL ECHOCARDIOGRAM (TEE);  Surgeon: Ivin Poot, MD;  Location: [place];  Service: Open Heart Surgery;  Laterality: N/A;   THYROIDECTOMY     Current Outpatient Medications on File Prior to Visit  Medication Sig Dispense Refill   Ascorbic Acid (VITAMIN C PO) Take 2 tablets by mouth daily.     aspirin EC 81 MG tablet Take 81 mg by mouth daily.     cholecalciferol (VITAMIN D3) 25 MCG (1000 UNIT) tablet Take 1,000 Units by mouth daily.     clopidogrel (PLAVIX) 75 MG tablet TAKE 1 TABLET BY MOUTH ONCE A DAY 90 tablet 2   Coenzyme Q10 (COQ10) 100 MG CAPS Take 100 mg by mouth daily.     Continuous Blood Gluc Sensor MISC 1 each by Does not apply route as directed. Use as directed every 10 days. May dispense FreeStyle [employer] or similar. 3 each 0   empagliflozin (JARDIANCE) 25 MG TABS tablet Take 25 mg by mouth daily.     levothyroxine (SYNTHROID) 100 MCG tablet Take 1 tablet (100 mcg total) by mouth daily before breakfast. 90 tablet 0   metFORMIN (GLUCOPHAGE-XR) 500 MG 24 hr tablet Take 1,000 mg by mouth 2 (two) times daily.      metoprolol tartrate (LOPRESSOR) 100 MG tablet TAKE 1 TABLET BY MOUTH TWICE A DAY 180 tablet 3   Multiple Vitamin (MULTIVITAMIN WITH MINERALS) TABS tablet Take 1 tablet by mouth 2 (two) times daily.     nitroGLYCERIN (NITROSTAT) 0.4 MG SL tablet Place 1 tablet (0.4 mg total) under the tongue every 5 (five) minutes as needed for chest pain. 25 tablet 0   Omega-3 Fatty Acids (FISH OIL PO) Take 900 mg by mouth daily.      ramipril (ALTACE) 10 MG capsule TAKE 1 CAPSULE BY MOUTH ONCE A DAY 90 capsule 3   simvastatin (ZOCOR) 40 MG tablet TAKE 1 TABLET BY MOUTH EVERY EVENING 90 tablet 0   No current facility-administered medications on file prior to visit.      Allergies:   Patient has no known allergies.   Social History:  The patient  reports that he has been smoking cigars. He has never used smokeless tobacco. He reports current alcohol use. He reports that he does not use drugs.   Family History:   family history includes Cancer (age of onset:  78) in his father; Cirrhosis in his sister; Drug abuse in his sister; Emphysema in his brother; Heart attack in his father and mother.    Review of Systems: Review of Systems  Constitutional: Negative.   HENT: Negative.    Respiratory: Negative.    Cardiovascular: Negative.   Gastrointestinal: Negative.   Musculoskeletal: Negative.   Neurological: Negative.   Psychiatric/Behavioral: Negative.    All other systems reviewed and are negative.  PHYSICAL EXAM: VS:  BP 130/72   Pulse 67   Ht 5\' 8"  (1.727 m)   Wt 183 lb (83 kg)   SpO2 98%   BMI 27.83 kg/m  , BMI Body mass index is 27.83 kg/m. Constitutional:  oriented to person, place, and time. No distress.  HENT:  Head: Grossly normal Eyes:  no discharge. No scleral icterus.  Neck: No JVD, no carotid bruits  Cardiovascular: Regular rate and rhythm, no murmurs appreciated Pulmonary/Chest: Clear to auscultation bilaterally, no wheezes or rails Abdominal: Soft.  no distension.  no tenderness.   Musculoskeletal: Normal range of motion Neurological:  normal muscle tone. Coordination normal. No atrophy Skin: Skin warm and dry Psychiatric: normal affect, pleasant  Recent Labs: No results found for requested labs within last 8760 hours.    Lipid Panel Lab Results  Component Value Date   CHOL 197 02/16/2014   HDL 34.10 (L) 02/16/2014   LDLCALC 61 04/11/2013   TRIG 402.0 (H) 02/16/2014    Wt Readings from Last 3 Encounters:  01/05/21 183 lb (83 kg)  02/20/20 195 lb (88.5 kg)  12/23/18 183 lb 8 oz (83.2 kg)     ASSESSMENT AND PLAN:  Coronary artery disease of native artery of native heart with stable angina pectoris (HCC) Currently with no symptoms of angina. No further workup at this time. Continue current medication regimen.  Mixed hyperlipidemia Numbers above goal, Continue simvastatin, did not tolerate other statins Did not tolerate Zetia Will likely need PCSK9 inhibitor, this was discussed in detail Scheduled to have repeat lab work done with primary care, he will call us once results are available  Essential hypertension Blood pressure is well controlled on today's visit. No changes made to the medications.  Paroxysmal atrial fibrillation (HCC)  postoperatively he had brief period of atrial tachycardia/atrial fibrillation, started on amiodarone,  Still maintaining normal sinus rhythm, denies any tachypalpitations  S/P coronary artery stent placement PTCA with stent to the RCA, on aspirin Denies angina with exercise  Uncontrolled type 2 diabetes mellitus with complication, with long-term current use of insulin (HCC) Stressed importance of keeping the weight low, A1c in the low 6 range He has follow-up with primary care for lab work  S/P CABG (coronary artery bypass graft) Stable Denies anginal symptoms, discussed the importance of aggressive diabetes and lipid control   Total encounter time more than 25 minutes  Greater than 50% was spent in counseling  and coordination of care with the patient   No orders of the defined types were placed in this encounter.    Signed, Esmond Plants, M.D., Ph.D. 01/05/2021  [place], [place]

## 2021-01-11 ENCOUNTER — Other Ambulatory Visit: Payer: Self-pay | Admitting: Cardiovascular Disease

## 2021-02-08 ENCOUNTER — Telehealth: Payer: Self-pay

## 2021-02-08 MED ORDER — METOPROLOL TARTRATE 100 MG PO TABS: 100.0000 mg | ORAL_TABLET | Freq: Two times a day (BID) | ORAL | 3 refills | Status: DC

## 2021-02-08 MED ORDER — CLOPIDOGREL BISULFATE 75 MG PO TABS: 75.0000 mg | ORAL_TABLET | Freq: Every day | ORAL | 3 refills | Status: DC

## 2021-02-08 MED ORDER — SIMVASTATIN 40 MG PO TABS: 40.0000 mg | ORAL_TABLET | Freq: Every evening | ORAL | 3 refills | Status: DC

## 2021-02-08 NOTE — Telephone Encounter (Signed)
Patient calling to update pharmacy and request refills.

## 2021-02-11 ENCOUNTER — Telehealth: Payer: Self-pay | Admitting: Cardiovascular Disease

## 2021-02-11 MED ORDER — METOPROLOL TARTRATE 100 MG PO TABS: 100.0000 mg | ORAL_TABLET | Freq: Two times a day (BID) | ORAL | 3 refills | Status: DC

## 2021-02-11 MED ORDER — SIMVASTATIN 40 MG PO TABS: 40.0000 mg | ORAL_TABLET | Freq: Every evening | ORAL | 3 refills | Status: AC

## 2021-02-11 MED ORDER — CLOPIDOGREL BISULFATE 75 MG PO TABS: 75.0000 mg | ORAL_TABLET | Freq: Every day | ORAL | 3 refills | Status: DC

## 2021-02-11 MED ORDER — RAMIPRIL 10 MG PO CAPS: 10.0000 mg | ORAL_CAPSULE | Freq: Every day | ORAL | 3 refills | Status: DC

## 2021-02-11 NOTE — Telephone Encounter (Signed)
°*  STAT* If patient is at the pharmacy, call can be transferred to refill team.   1. Which medications need to be refilled? (please list name of each medication and dose if known)   Plavix  Metoprolol  Simvastatin Ramipril    2. Which pharmacy/location (including street and city if local pharmacy) is medication to be sent to? [phone]  3. Do they need a 30 day or 90 day supply? 90  Previous attempt to refill encounter failed .  Patient will check with pharmacy later today

## 2021-07-02 ENCOUNTER — Encounter: Payer: Self-pay | Admitting: Cardiovascular Disease

## 2021-07-04 ENCOUNTER — Other Ambulatory Visit: Payer: Self-pay

## 2021-07-04 MED ORDER — METOPROLOL TARTRATE 100 MG PO TABS: 100.0000 mg | ORAL_TABLET | Freq: Two times a day (BID) | ORAL | 1 refills | Status: DC

## 2021-07-04 MED ORDER — RAMIPRIL 10 MG PO CAPS: 10.0000 mg | ORAL_CAPSULE | Freq: Every day | ORAL | 1 refills | Status: DC

## 2021-07-04 MED ORDER — CLOPIDOGREL BISULFATE 75 MG PO TABS: 75.0000 mg | ORAL_TABLET | Freq: Every day | ORAL | 1 refills | Status: DC

## 2021-07-05 NOTE — Progress Notes (Unsigned)
Cardiology Office Note  Date:  07/06/2021   ID:  Juan [name], Juan [name] August 02, 1955, MRN [id number]  PCP:  Maryland Pink, MD   Chief Complaint  Patient presents with   6 month follow up     Patient c/o chest pain and shortness of breath with walking, within 5-10 minutes after walking symptoms are relieved. Medications reviewed by the patient verbally.     HPI:  Mr. Benninger is a very pleasant 66 year old male with a history of  coronary artery disease,  multiple cardiac catheterizations with intervention dating back to 1994,  total of 8 stents,  Diabetes, hemoglobin A1c6 hyperlipidemia, difficult time tolerating numerous statins including Zetia (Tolerating simvastatin 40) Paroxysmal atrial fibrillation, post op CABG  [facility] with angina,  Cardiac catheterization showing three-vessel disease,  referred to [facility] for CABG. 01/29/15 He presents today for follow-up of his coronary artery disease, CABG  LOV December 2022 On follow-up today reports having Some increased SOB when walking, some chest  discomfort Chest pain in bed when turning Getting worse with with time Now taking rare nitro, never had to take nitro before Pain will resolve with sublingual nitro  Continues to do regular exercise, starts his daily walk heading up the hill and has noticed some chest tightness that resolves after walking down the hill  No leg edema, no PND orthopnea, no near-syncope or syncope  Tolerating simvastatin, did not tolerate Zetia Total cholesterol above goal, numbers discussed  EKG personally reviewed by myself on todays visit Normal sinus rhythm rate 62 bpm ST and T wave abnormality anterolateral leads, unchanged from prior EKG   Other past medical history reviewed Tested positive for COVID December 26, 2020 Symptoms started November 25  Zetia side effects diarrhea Has stayed on simvastatin, could not tolerate other statins  Cardiac catheterization November 24, 2016  1.   Significant underlying three-vessel coronary artery disease with patent LIMA to LAD and patent SVG to OM1.  Occluded SVG to OM 2 and SVG to right PDA. 2.  Low normal LV systolic function with an EF of 50% with normal left ventricular end-diastolic pressure. 3.  Unsuccessful attempted angioplasty of the right coronary artery due to inability to advance the wire into the distal right coronary artery as the result of tortuosity and a bifurcation with a large RV marginal.   Reattempt at intervention of the RCA November 29, 2016 in [place] Successful complex angioplasty and drug-eluting stent placement to the mid right coronary artery. Very difficult procedure due to calcified vessel with difficulty advancing equipment.  The RV branch was jailed by the stent but had TIMI-3 flow.    PMH:   has a past medical history of Childhood asthma, Coronary artery disease, Essential hypertension, GERD (gastroesophageal reflux disease), Heart murmur, History of kidney stones, Hyperlipidemia, Hypothyroidism, Mild sleep apnea, Myocardial infarction ([place]) (1996), and Type II diabetes mellitus ([place]).  PSH:    Past Surgical History:  Procedure Laterality Date   [place]; Dr.Harold; [place]  2002   "total of 8 stents by now" (11/29/2016)   CARDIAC CATHETERIZATION N/A 01/21/2015   Procedure: Left Heart Cath and Coronary Angiography;  Surgeon: Wellington Hampshire, MD;  Location: [place] CV LAB;  Service: Cardiovascular;  Laterality: N/A;   CARDIAC CATHETERIZATION  11/24/2016   "couldn't get stent in this time" (11/29/2016)   COLONOSCOPY WITH PROPOFOL N/A 04/17/2018   Procedure: COLONOSCOPY WITH PROPOFOL;  Surgeon: Alice Reichert, Benay Pike, MD;  Location: [facility] ENDOSCOPY;  Service: Gastroenterology;  Laterality: N/A;   [place]; Dr. Joneen Boers; [place] WITH STENT PLACEMENT  1996    ""   CORONARY ANGIOPLASTY WITH STENT PLACEMENT     ''''   CORONARY ANGIOPLASTY WITH STENT PLACEMENT     '''   CORONARY ANGIOPLASTY WITH STENT PLACEMENT     '''   CORONARY ANGIOPLASTY WITH STENT PLACEMENT  2006   "2 stents for a total of 10 stents"; [facility]; [place].    CORONARY ANGIOPLASTY WITH STENT PLACEMENT  11/29/2016   "1 stent"   CORONARY ARTERY BYPASS GRAFT N/A 01/29/2015   Procedure: CORONARY ARTERY BYPASS GRAFTING (CABG)x four using left internal mammary artery artery and bilateral saphenous thigh vein using endoscope.;  Surgeon: Ivin Poot, MD;  Location: [place];  Service: Open Heart Surgery;  Laterality: N/A;   CORONARY STENT INTERVENTION N/A 11/24/2016   Procedure: CORONARY STENT INTERVENTION;  Surgeon: Wellington Hampshire, MD;  Location: [place] CV LAB;  Service: Cardiovascular;  Laterality: N/A;   CORONARY STENT INTERVENTION N/A 11/29/2016   Procedure: CORONARY STENT INTERVENTION;  Surgeon: Wellington Hampshire, MD;  Location: [place] CV LAB;  Service: Cardiovascular;  Laterality: N/A;   GANGLION CYST EXCISION Right    LEFT HEART CATH AND CORONARY ANGIOGRAPHY Left 11/24/2016   Procedure: LEFT HEART CATH AND CORONARY ANGIOGRAPHY;  Surgeon: Wellington Hampshire, MD;  Location: [place] CV LAB;  Service: Cardiovascular;  Laterality: Left;   NASAL SEPTUM SURGERY     TEE WITHOUT CARDIOVERSION N/A 01/29/2015   Procedure: TRANSESOPHAGEAL ECHOCARDIOGRAM (TEE);  Surgeon: Ivin Poot, MD;  Location: [place];  Service: Open Heart Surgery;  Laterality: N/A;   THYROIDECTOMY     Current Outpatient Medications on File Prior to Visit  Medication Sig Dispense Refill   Ascorbic Acid (VITAMIN C PO) Take 2 tablets by mouth daily.     aspirin EC 81 MG tablet Take 81 mg by mouth daily.     cholecalciferol (VITAMIN D3) 25 MCG (1000 UNIT) tablet Take 1,000 Units by mouth daily.     clopidogrel (PLAVIX) 75 MG tablet Take 1 tablet (75 mg total) by mouth daily. 90 tablet 1    Coenzyme Q10 (COQ10) 100 MG CAPS Take 100 mg by mouth daily.     Continuous Blood Gluc Sensor MISC 1 each by Does not apply route as directed. Use as directed every 10 days. May dispense FreeStyle [employer] or similar. 3 each 0   empagliflozin (JARDIANCE) 25 MG TABS tablet Take 25 mg by mouth daily.     levothyroxine (SYNTHROID) 100 MCG tablet Take 1 tablet (100 mcg total) by mouth daily before breakfast. 90 tablet 0   metFORMIN (GLUCOPHAGE-XR) 500 MG 24 hr tablet Take 1,000 mg by mouth 2 (two) times daily.     metoprolol tartrate (LOPRESSOR) 100 MG tablet Take 1 tablet (100 mg total) by mouth 2 (two) times daily. 180 tablet 1   Multiple Vitamin (MULTIVITAMIN WITH MINERALS) TABS tablet Take 1 tablet by mouth 2 (two) times daily.     nitroGLYCERIN (NITROSTAT) 0.4 MG SL tablet Place 1 tablet (0.4 mg total) under the tongue every 5 (five) minutes as needed for chest pain. 25 tablet 1   Omega-3 Fatty Acids (FISH OIL PO) Take 900 mg by mouth daily.      ramipril (ALTACE) 10 MG capsule Take 1 capsule (10 mg total) by  mouth daily. 90 capsule 1   simvastatin (ZOCOR) 40 MG tablet Take 1 tablet (40 mg total) by mouth every evening. 90 tablet 3   No current facility-administered medications on file prior to visit.      Allergies:   Patient has no known allergies.   Social History:  The patient  reports that he has been smoking cigars. He has never used smokeless tobacco. He reports current alcohol use. He reports that he does not use drugs.   Family History:   family history includes Cancer (age of onset: 13) in his father; Cirrhosis in his sister; Drug abuse in his sister; Emphysema in his brother; Heart attack in his father and mother.    Review of Systems: Review of Systems  Constitutional: Negative.   HENT: Negative.    Respiratory:  Positive for shortness of breath.   Cardiovascular:  Positive for chest pain.  Gastrointestinal: Negative.   Musculoskeletal: Negative.    Neurological: Negative.   Psychiatric/Behavioral: Negative.    All other systems reviewed and are negative.  PHYSICAL EXAM: VS:  BP 130/70 (BP Location: Left Arm, Patient Position: Sitting, Cuff Size: Normal)   Pulse 62   Ht '5\' 8"'$  (1.727 m)   Wt 190 lb 2 oz (86.2 kg)   SpO2 98%   BMI 28.91 kg/m  , BMI Body mass index is 28.91 kg/m. Constitutional:  oriented to person, place, and time. No distress.  HENT:  Head: Grossly normal Eyes:  no discharge. No scleral icterus.  Neck: No JVD, no carotid bruits  Cardiovascular: Regular rate and rhythm, no murmurs appreciated Pulmonary/Chest: Clear to auscultation bilaterally, no wheezes or rails Abdominal: Soft.  no distension.  no tenderness.  Musculoskeletal: Normal range of motion Neurological:  normal muscle tone. Coordination normal. No atrophy Skin: Skin warm and dry Psychiatric: normal affect, pleasant   Recent Labs: No results found for requested labs within last 8760 hours.    Lipid Panel Lab Results  Component Value Date   CHOL 197 02/16/2014   HDL 34.10 (L) 02/16/2014   LDLCALC 61 04/11/2013   TRIG 402.0 (H) 02/16/2014    Wt Readings from Last 3 Encounters:  07/06/21 190 lb 2 oz (86.2 kg)  01/05/21 183 lb (83 kg)  02/20/20 195 lb (88.5 kg)     ASSESSMENT AND PLAN:  Coronary artery disease of native artery of native heart with stable angina pectoris ([place]) Reports having increasing shortness of breath and some chest discomfort sometimes at rest other times with exertion Myoview offered, prefers to wait at this time We will start isosorbide 30 mg daily Recommend he call if symptoms get worse, stress testing could be ordered  Mixed hyperlipidemia Numbers above goal, Continue simvastatin, did not tolerate other statins Did not tolerate Zetia We have offered PCSK9 inhibitor, this was discussed in detail Also discussed bempedoic acid He will call if he would like to start 1 of these  Essential  hypertension Blood pressure is well controlled on today's visit.  Isosorbide 30 mg daily started as above  Paroxysmal atrial fibrillation (HCC)  postoperatively he had brief period of atrial tachycardia/atrial fibrillation, started on amiodarone,  Maintaining normal sinus rhythm  S/P coronary artery stent placement PTCA with stent to the RCA, on aspirin Denies angina with exercise  Uncontrolled type 2 diabetes mellitus with complication, with long-term current use of insulin (HCC) Stressed importance of keeping the weight low, A1c in the low 6 range A1c running 6.8 Continued exercise recommended  S/P CABG (coronary artery  bypass graft) Stable anginal symptoms as above   Total encounter time more than 30 minutes  Greater than 50% was spent in counseling and coordination of care with the patient   No orders of the defined types were placed in this encounter.    Signed, Esmond Plants, M.D., Ph.D. 07/06/2021  [place], [place]

## 2021-07-06 ENCOUNTER — Ambulatory Visit: Payer: Medicare Other | Admitting: Cardiovascular Disease

## 2021-07-06 ENCOUNTER — Encounter: Payer: Self-pay | Admitting: Cardiovascular Disease

## 2021-07-06 VITALS — BP 130/70 | HR 62 | Ht 68.0 in | Wt 190.1 lb

## 2021-07-06 DIAGNOSIS — M791 Myalgia, unspecified site: Secondary | ICD-10-CM

## 2021-07-06 DIAGNOSIS — E782 Mixed hyperlipidemia: Secondary | ICD-10-CM

## 2021-07-06 DIAGNOSIS — I480 Paroxysmal atrial fibrillation: Secondary | ICD-10-CM

## 2021-07-06 DIAGNOSIS — I25118 Atherosclerotic heart disease of native coronary artery with other forms of angina pectoris: Secondary | ICD-10-CM

## 2021-07-06 DIAGNOSIS — I10 Essential (primary) hypertension: Secondary | ICD-10-CM | POA: Diagnosis not present

## 2021-07-06 DIAGNOSIS — Z951 Presence of aortocoronary bypass graft: Secondary | ICD-10-CM

## 2021-07-06 DIAGNOSIS — T466X5A Adverse effect of antihyperlipidemic and antiarteriosclerotic drugs, initial encounter: Secondary | ICD-10-CM

## 2021-07-06 MED ORDER — ISOSORBIDE MONONITRATE ER 30 MG PO TB24: 30.0000 mg | ORAL_TABLET | Freq: Every day | ORAL | 3 refills | Status: DC

## 2021-07-06 NOTE — Patient Instructions (Addendum)
Read about repatha/praluent And bempidoic acid  Medication Instructions:  Please start imdur/isosorbide 30 mg daily  If you need a refill on your cardiac medications before your next appointment, please call your pharmacy.   Lab work: No new labs needed  Testing/Procedures: No new testing needed  Follow-Up: At [facility], you and your health needs are our priority.  As part of our continuing mission to provide you with exceptional heart care, we have created designated Provider Care Teams.  These Care Teams include your primary Cardiologist (physician) and Advanced Practice Providers (APPs -  Physician Assistants and Nurse Practitioners) who all work together to provide you with the care you need, when you need it.  You will need a follow up appointment in 6 months  Providers on your designated Care Team:   Murray Hodgkins, NP Christell Faith, PA-C Cadence Kathlen Mody, [place]  COVID-19 Vaccine Information can be found at: [email or web address] For questions related to vaccine distribution or appointments, please email [email or web address] or call [phone].

## 2021-07-12 ENCOUNTER — Encounter: Payer: Self-pay | Admitting: *Deleted

## 2021-07-12 ENCOUNTER — Emergency Department: Payer: Medicare Other

## 2021-07-12 ENCOUNTER — Other Ambulatory Visit: Payer: Self-pay

## 2021-07-12 ENCOUNTER — Inpatient Hospital Stay
Admission: EM | Admit: 2021-07-12 | Discharge: 2021-07-14 | DRG: 247 | Disposition: A | Discharge status: Home / Self Care | Payer: Medicare Other | Attending: Internal Medicine | Admitting: Internal Medicine

## 2021-07-12 DIAGNOSIS — I480 Paroxysmal atrial fibrillation: Secondary | ICD-10-CM | POA: Diagnosis present

## 2021-07-12 DIAGNOSIS — Z79890 Hormone replacement therapy (postmenopausal): Secondary | ICD-10-CM

## 2021-07-12 DIAGNOSIS — E785 Hyperlipidemia, unspecified: Secondary | ICD-10-CM | POA: Diagnosis present

## 2021-07-12 DIAGNOSIS — F17290 Nicotine dependence, other tobacco product, uncomplicated: Secondary | ICD-10-CM | POA: Diagnosis present

## 2021-07-12 DIAGNOSIS — I252 Old myocardial infarction: Secondary | ICD-10-CM

## 2021-07-12 DIAGNOSIS — Z7982 Long term (current) use of aspirin: Secondary | ICD-10-CM

## 2021-07-12 DIAGNOSIS — Z955 Presence of coronary angioplasty implant and graft: Secondary | ICD-10-CM

## 2021-07-12 DIAGNOSIS — I25110 Atherosclerotic heart disease of native coronary artery with unstable angina pectoris: Secondary | ICD-10-CM | POA: Diagnosis present

## 2021-07-12 DIAGNOSIS — E1165 Type 2 diabetes mellitus with hyperglycemia: Secondary | ICD-10-CM | POA: Diagnosis present

## 2021-07-12 DIAGNOSIS — I25700 Atherosclerosis of coronary artery bypass graft(s), unspecified, with unstable angina pectoris: Secondary | ICD-10-CM | POA: Diagnosis present

## 2021-07-12 DIAGNOSIS — E119 Type 2 diabetes mellitus without complications: Secondary | ICD-10-CM

## 2021-07-12 DIAGNOSIS — Z7984 Long term (current) use of oral hypoglycemic drugs: Secondary | ICD-10-CM

## 2021-07-12 DIAGNOSIS — I214 Non-ST elevation (NSTEMI) myocardial infarction: Secondary | ICD-10-CM | POA: Diagnosis not present

## 2021-07-12 DIAGNOSIS — Z8249 Family history of ischemic heart disease and other diseases of the circulatory system: Secondary | ICD-10-CM

## 2021-07-12 DIAGNOSIS — K219 Gastro-esophageal reflux disease without esophagitis: Secondary | ICD-10-CM | POA: Diagnosis present

## 2021-07-12 DIAGNOSIS — I10 Essential (primary) hypertension: Secondary | ICD-10-CM | POA: Diagnosis present

## 2021-07-12 DIAGNOSIS — Z79899 Other long term (current) drug therapy: Secondary | ICD-10-CM

## 2021-07-12 DIAGNOSIS — E890 Postprocedural hypothyroidism: Secondary | ICD-10-CM | POA: Diagnosis present

## 2021-07-12 DIAGNOSIS — D696 Thrombocytopenia, unspecified: Secondary | ICD-10-CM | POA: Diagnosis present

## 2021-07-12 DIAGNOSIS — Z7902 Long term (current) use of antithrombotics/antiplatelets: Secondary | ICD-10-CM

## 2021-07-12 DIAGNOSIS — Z951 Presence of aortocoronary bypass graft: Secondary | ICD-10-CM

## 2021-07-12 LAB — BASIC METABOLIC PANEL
Anion gap: 12 (ref 5–15)
BUN: 15 mg/dL (ref 8–23)
CO2: 24 mmol/L (ref 22–32)
Calcium: 9.6 mg/dL (ref 8.9–10.3)
Chloride: 104 mmol/L (ref 98–111)
Creatinine, Ser: 0.69 mg/dL (ref 0.61–1.24)
GFR, Estimated: >60 mL/min (ref >60)
Glucose, Bld: 189 mg/dL — ABNORMAL HIGH (ref 70–99)
Potassium: 4.4 mmol/L (ref 3.5–5.1)
Sodium: 140 mmol/L (ref 135–145)

## 2021-07-12 LAB — CBC
HCT: 47.4 % (ref 39.0–52.0)
Hemoglobin: 15.9 g/dL (ref 13.0–17.0)
MCH: 32.1 pg (ref 26.0–34.0)
MCHC: 33.5 g/dL (ref 30.0–36.0)
MCV: 95.8 fL (ref 80.0–100.0)
Platelets: 119 10*3/uL — ABNORMAL LOW (ref 150–400)
RBC: 4.95 MIL/uL (ref 4.22–5.81)
RDW: 13.9 % (ref 11.5–15.5)
WBC: 5.2 10*3/uL (ref 4.0–10.5)
nRBC: 0 % (ref 0.0–0.2)

## 2021-07-12 LAB — TROPONIN I (HIGH SENSITIVITY): Troponin I (High Sensitivity): 21 ng/L — ABNORMAL HIGH (ref <18)

## 2021-07-12 MED ORDER — HEPARIN BOLUS VIA INFUSION
4000.0000 [IU] | Freq: Once | INTRAVENOUS | Status: AC
  Administered 2021-07-13: 4000 [IU] via INTRAVENOUS
  Filled 2021-07-12: qty 4000

## 2021-07-12 MED ORDER — HEPARIN (PORCINE) 25000 UT/250ML-% IV SOLN
1050.0000 [IU]/h | INTRAVENOUS | Status: DC
  Administered 2021-07-13: 1050 [IU]/h via INTRAVENOUS
  Filled 2021-07-12: qty 250

## 2021-07-12 MED ORDER — ASPIRIN 81 MG PO CHEW
324.0000 mg | CHEWABLE_TABLET | Freq: Once | ORAL | Status: AC
  Administered 2021-07-13: 324 mg via ORAL
  Filled 2021-07-12: qty 4

## 2021-07-12 NOTE — Progress Notes (Signed)
[place] for heparin infusion Indication: {Indications:3041533}  No Known Allergies  Patient Measurements: Height: '5\' 8"'$  (172.7 cm) Weight: 82.1 kg (181 lb) IBW/kg (Calculated) : 68.4 Heparin Dosing Weight: 82.1 kg  Vital Signs: Temp: 99.5 F (37.5 C) (06/13 2228) Temp Source: Oral (06/13 2228) BP: 152/73 (06/13 2228) Pulse Rate: 86 (06/13 2228)  Labs: Recent Labs    07/12/21 2231  HGB 15.9  HCT 47.4  PLT 119*  CREATININE 0.69  TROPONINIHS 21*    Estimated Creatinine Clearance: 97.5 mL/min (by C-G formula based on SCr of 0.69 mg/dL).   Medical History: Past Medical History:  Diagnosis Date   Childhood asthma    Coronary artery disease    a. Dating back to 16 w/ h/o MI.  8 stents;  b. 10/2003 Cath [facility]: LM 20d, RI 40p, LAD 30p, 79m patent stent, D1 50p, LCX 30p, patent mid stent, 90d/diffuse, RCA dominant, 30p, patent stent, 442m, EF 44%-->Med Rx.   Essential hypertension    GERD (gastroesophageal reflux disease)    Heart murmur    found during office visit   History of kidney stones    Hyperlipidemia    Hypothyroidism    Mild sleep apnea    "don't need mask" (11/29/2016)   [facility]   "[facility]"    [place]    Assessment: *** Goal of Therapy:  {Goals:3041534} {Monitor platelets by anticoagulation protocol:3041561::"Monitor platelets by anticoagulation protocol: Yes"}   Plan:  Bolus 4000 units x 1 Start heparin infusion at 1050 units/hr Will check HL in 6 hrs after start of infusion CBC daily while on heparin  NaRenda RollsPharmD, MBSouthcoast Hospitals Group [facility]/13/2023 11:23 PM

## 2021-07-12 NOTE — ED Triage Notes (Addendum)
Pt has chest pain since 1930 tonight.  Pain began during sexual intercourse.    Pt took ntg with some relief.  Intermittent sob.  Pt has nausea.  Pt alert.  Speech clear.

## 2021-07-12 NOTE — ED Notes (Signed)
Report received from Kate, RN

## 2021-07-13 ENCOUNTER — Other Ambulatory Visit: Payer: Self-pay

## 2021-07-13 ENCOUNTER — Inpatient Hospital Stay (HOSPITAL_COMMUNITY)
Admit: 2021-07-13 | Discharge: 2021-07-13 | Disposition: A | Discharge status: Home / Self Care | Payer: Medicare Other | Attending: Cardiology | Admitting: Cardiology

## 2021-07-13 ENCOUNTER — Encounter
Admission: EM | Disposition: A | Discharge status: Home / Self Care | Payer: Self-pay | Source: Home / Self Care | Attending: Internal Medicine

## 2021-07-13 DIAGNOSIS — I2510 Atherosclerotic heart disease of native coronary artery without angina pectoris: Secondary | ICD-10-CM

## 2021-07-13 DIAGNOSIS — I25700 Atherosclerosis of coronary artery bypass graft(s), unspecified, with unstable angina pectoris: Secondary | ICD-10-CM | POA: Diagnosis present

## 2021-07-13 DIAGNOSIS — I25810 Atherosclerosis of coronary artery bypass graft(s) without angina pectoris: Secondary | ICD-10-CM

## 2021-07-13 DIAGNOSIS — F17290 Nicotine dependence, other tobacco product, uncomplicated: Secondary | ICD-10-CM | POA: Diagnosis present

## 2021-07-13 DIAGNOSIS — I214 Non-ST elevation (NSTEMI) myocardial infarction: Secondary | ICD-10-CM | POA: Diagnosis present

## 2021-07-13 DIAGNOSIS — E890 Postprocedural hypothyroidism: Secondary | ICD-10-CM | POA: Diagnosis present

## 2021-07-13 DIAGNOSIS — E1165 Type 2 diabetes mellitus with hyperglycemia: Secondary | ICD-10-CM | POA: Diagnosis present

## 2021-07-13 DIAGNOSIS — Z955 Presence of coronary angioplasty implant and graft: Secondary | ICD-10-CM | POA: Diagnosis not present

## 2021-07-13 DIAGNOSIS — Z7984 Long term (current) use of oral hypoglycemic drugs: Secondary | ICD-10-CM | POA: Diagnosis not present

## 2021-07-13 DIAGNOSIS — Z79899 Other long term (current) drug therapy: Secondary | ICD-10-CM | POA: Diagnosis not present

## 2021-07-13 DIAGNOSIS — Z7902 Long term (current) use of antithrombotics/antiplatelets: Secondary | ICD-10-CM | POA: Diagnosis not present

## 2021-07-13 DIAGNOSIS — I4891 Unspecified atrial fibrillation: Secondary | ICD-10-CM | POA: Diagnosis not present

## 2021-07-13 DIAGNOSIS — I480 Paroxysmal atrial fibrillation: Secondary | ICD-10-CM | POA: Diagnosis present

## 2021-07-13 DIAGNOSIS — K219 Gastro-esophageal reflux disease without esophagitis: Secondary | ICD-10-CM | POA: Diagnosis present

## 2021-07-13 DIAGNOSIS — I10 Essential (primary) hypertension: Secondary | ICD-10-CM | POA: Diagnosis present

## 2021-07-13 DIAGNOSIS — D696 Thrombocytopenia, unspecified: Secondary | ICD-10-CM | POA: Diagnosis present

## 2021-07-13 DIAGNOSIS — E785 Hyperlipidemia, unspecified: Secondary | ICD-10-CM

## 2021-07-13 DIAGNOSIS — Z951 Presence of aortocoronary bypass graft: Secondary | ICD-10-CM | POA: Diagnosis not present

## 2021-07-13 DIAGNOSIS — I25110 Atherosclerotic heart disease of native coronary artery with unstable angina pectoris: Secondary | ICD-10-CM | POA: Diagnosis present

## 2021-07-13 DIAGNOSIS — Z79890 Hormone replacement therapy (postmenopausal): Secondary | ICD-10-CM | POA: Diagnosis not present

## 2021-07-13 DIAGNOSIS — E1169 Type 2 diabetes mellitus with other specified complication: Secondary | ICD-10-CM

## 2021-07-13 DIAGNOSIS — I252 Old myocardial infarction: Secondary | ICD-10-CM | POA: Diagnosis not present

## 2021-07-13 DIAGNOSIS — Z7982 Long term (current) use of aspirin: Secondary | ICD-10-CM | POA: Diagnosis not present

## 2021-07-13 DIAGNOSIS — Z8249 Family history of ischemic heart disease and other diseases of the circulatory system: Secondary | ICD-10-CM | POA: Diagnosis not present

## 2021-07-13 HISTORY — PX: CORONARY STENT INTERVENTION: CATH118234

## 2021-07-13 HISTORY — PX: LEFT HEART CATH AND CORONARY ANGIOGRAPHY: CATH118249

## 2021-07-13 LAB — CBG MONITORING, ED
Glucose-Capillary: 137 mg/dL — ABNORMAL HIGH (ref 70–99)
Glucose-Capillary: 149 mg/dL — ABNORMAL HIGH (ref 70–99)
Glucose-Capillary: 145 mg/dL — ABNORMAL HIGH (ref 70–99)

## 2021-07-13 LAB — HEMOGLOBIN A1C
Hgb A1c MFr Bld: 6.5 % — ABNORMAL HIGH (ref 4.8–5.6)
Mean Plasma Glucose: 139.85 mg/dL

## 2021-07-13 LAB — POCT ACTIVATED CLOTTING TIME
Activated Clotting Time: 293 seconds
Activated Clotting Time: 293 seconds

## 2021-07-13 LAB — ECHOCARDIOGRAM COMPLETE
AR max vel: 2.35 cm2
AV Area VTI: 2.88 cm2
AV Area mean vel: 2.42 cm2
AV Mean grad: 2.5 mmHg
AV Peak grad: 4.7 mmHg
Ao pk vel: 1.09 m/s
Area-P 1/2: 2.78 cm2
Calc EF: 60.7 %
Height: 68 in
MV VTI: 1.86 cm2
S' Lateral: 3.65 cm
Single Plane A2C EF: 62.6 %
Single Plane A4C EF: 55.3 %
Weight: 2896 oz

## 2021-07-13 LAB — PROTIME-INR
INR: 1.1 (ref 0.8–1.2)
Prothrombin Time: 14.6 seconds (ref 11.4–15.2)

## 2021-07-13 LAB — GLUCOSE, CAPILLARY
Glucose-Capillary: 126 mg/dL — ABNORMAL HIGH (ref 70–99)
Glucose-Capillary: 130 mg/dL — ABNORMAL HIGH (ref 70–99)
Glucose-Capillary: 170 mg/dL — ABNORMAL HIGH (ref 70–99)

## 2021-07-13 LAB — HEPARIN LEVEL (UNFRACTIONATED): Heparin Unfractionated: 0.32 IU/mL (ref 0.30–0.70)

## 2021-07-13 LAB — TROPONIN I (HIGH SENSITIVITY)
Troponin I (High Sensitivity): 58 ng/L — ABNORMAL HIGH (ref <18)
Troponin I (High Sensitivity): 522 ng/L (ref <18)

## 2021-07-13 LAB — APTT: aPTT: 32 seconds (ref 24–36)

## 2021-07-13 LAB — HIV ANTIBODY (ROUTINE TESTING W REFLEX): HIV Screen 4th Generation wRfx: NONREACTIVE

## 2021-07-13 SURGERY — LEFT HEART CATH AND CORONARY ANGIOGRAPHY
Anesthesia: Moderate Sedation

## 2021-07-13 MED ORDER — MIDAZOLAM HCL 2 MG/2ML IJ SOLN
INTRAMUSCULAR | Status: AC
  Filled 2021-07-13: qty 2

## 2021-07-13 MED ORDER — PRASUGREL HCL 10 MG PO TABS
ORAL_TABLET | ORAL | Status: AC
  Filled 2021-07-13: qty 6

## 2021-07-13 MED ORDER — ISOSORBIDE MONONITRATE ER 30 MG PO TB24
30.0000 mg | ORAL_TABLET | Freq: Every day | ORAL | Status: DC
  Administered 2021-07-13 – 2021-07-14 (×2): 30 mg via ORAL
  Filled 2021-07-13 (×2): qty 1

## 2021-07-13 MED ORDER — LIDOCAINE HCL (PF) 1 % IJ SOLN
INTRAMUSCULAR | Status: DC | PRN
  Administered 2021-07-13: 2 mL

## 2021-07-13 MED ORDER — HEPARIN (PORCINE) IN NACL 1000-0.9 UT/500ML-% IV SOLN
INTRAVENOUS | Status: DC | PRN
  Administered 2021-07-13 (×3): 500 mL

## 2021-07-13 MED ORDER — NITROGLYCERIN 1 MG/10 ML FOR IR/CATH LAB
INTRA_ARTERIAL | Status: DC | PRN
  Administered 2021-07-13: 200 ug via INTRACORONARY

## 2021-07-13 MED ORDER — NITROGLYCERIN 1 MG/10 ML FOR IR/CATH LAB
INTRA_ARTERIAL | Status: AC
  Filled 2021-07-13: qty 10

## 2021-07-13 MED ORDER — SODIUM CHLORIDE 0.9 % WEIGHT BASED INFUSION: 1.0000 mL/kg/h | INTRAVENOUS | Status: DC

## 2021-07-13 MED ORDER — SODIUM CHLORIDE 0.9 % WEIGHT BASED INFUSION
3.0000 mL/kg/h | INTRAVENOUS | Status: DC
  Administered 2021-07-13: 3 mL/kg/h via INTRAVENOUS

## 2021-07-13 MED ORDER — HEPARIN (PORCINE) IN NACL 1000-0.9 UT/500ML-% IV SOLN
INTRAVENOUS | Status: AC
  Filled 2021-07-13: qty 1000

## 2021-07-13 MED ORDER — PRASUGREL HCL 10 MG PO TABS
10.0000 mg | ORAL_TABLET | Freq: Every day | ORAL | Status: DC
  Administered 2021-07-14: 10 mg via ORAL
  Filled 2021-07-13: qty 1

## 2021-07-13 MED ORDER — VERAPAMIL HCL 2.5 MG/ML IV SOLN
INTRAVENOUS | Status: DC | PRN
  Administered 2021-07-13 (×2): 500 ug via INTRACORONARY
  Administered 2021-07-13: 2.5 mg via INTRA_ARTERIAL

## 2021-07-13 MED ORDER — SODIUM CHLORIDE 0.9 % IV SOLN: 250.0000 mL | INTRAVENOUS | Status: DC | PRN

## 2021-07-13 MED ORDER — LABETALOL HCL 5 MG/ML IV SOLN: 10.0000 mg | INTRAVENOUS | Status: AC | PRN

## 2021-07-13 MED ORDER — ONDANSETRON HCL 4 MG/2ML IJ SOLN: 4.0000 mg | Freq: Four times a day (QID) | INTRAMUSCULAR | Status: DC | PRN

## 2021-07-13 MED ORDER — SIMVASTATIN 20 MG PO TABS: 40.0000 mg | ORAL_TABLET | Freq: Every evening | ORAL | Status: DC

## 2021-07-13 MED ORDER — IOHEXOL 300 MG/ML  SOLN
INTRAMUSCULAR | Status: DC | PRN
  Administered 2021-07-13: 157 mL

## 2021-07-13 MED ORDER — INSULIN ASPART 100 UNIT/ML IJ SOLN
0.0000 [IU] | INTRAMUSCULAR | Status: DC
  Filled 2021-07-13 (×2): qty 1

## 2021-07-13 MED ORDER — SODIUM CHLORIDE 0.9% FLUSH: 3.0000 mL | INTRAVENOUS | Status: DC | PRN

## 2021-07-13 MED ORDER — FENTANYL CITRATE (PF) 100 MCG/2ML IJ SOLN
INTRAMUSCULAR | Status: AC
  Filled 2021-07-13: qty 2

## 2021-07-13 MED ORDER — RAMIPRIL 10 MG PO CAPS
10.0000 mg | ORAL_CAPSULE | Freq: Every day | ORAL | Status: DC
  Administered 2021-07-13 – 2021-07-14 (×2): 10 mg via ORAL
  Filled 2021-07-13 (×2): qty 1

## 2021-07-13 MED ORDER — VERAPAMIL HCL 2.5 MG/ML IV SOLN
INTRAVENOUS | Status: AC
  Filled 2021-07-13: qty 2

## 2021-07-13 MED ORDER — LEVOTHYROXINE SODIUM 100 MCG PO TABS
100.0000 ug | ORAL_TABLET | Freq: Every day | ORAL | Status: DC
  Administered 2021-07-13 – 2021-07-14 (×2): 100 ug via ORAL
  Filled 2021-07-13: qty 2
  Filled 2021-07-13: qty 1

## 2021-07-13 MED ORDER — SODIUM CHLORIDE 0.9% FLUSH
3.0000 mL | Freq: Two times a day (BID) | INTRAVENOUS | Status: DC
  Administered 2021-07-14: 3 mL via INTRAVENOUS

## 2021-07-13 MED ORDER — ENOXAPARIN SODIUM 40 MG/0.4ML IJ SOSY
40.0000 mg | PREFILLED_SYRINGE | INTRAMUSCULAR | Status: DC
  Administered 2021-07-14: 40 mg via SUBCUTANEOUS
  Filled 2021-07-13: qty 0.4

## 2021-07-13 MED ORDER — HEPARIN SODIUM (PORCINE) 1000 UNIT/ML IJ SOLN
INTRAMUSCULAR | Status: AC
  Filled 2021-07-13: qty 10

## 2021-07-13 MED ORDER — MIDAZOLAM HCL 2 MG/2ML IJ SOLN
INTRAMUSCULAR | Status: DC | PRN
  Administered 2021-07-13 (×2): 1 mg via INTRAVENOUS

## 2021-07-13 MED ORDER — HYDRALAZINE HCL 20 MG/ML IJ SOLN: 10.0000 mg | INTRAMUSCULAR | Status: AC | PRN

## 2021-07-13 MED ORDER — HEPARIN (PORCINE) IN NACL 1000-0.9 UT/500ML-% IV SOLN
INTRAVENOUS | Status: AC
  Filled 2021-07-13: qty 500

## 2021-07-13 MED ORDER — METOPROLOL TARTRATE 50 MG PO TABS
100.0000 mg | ORAL_TABLET | Freq: Two times a day (BID) | ORAL | Status: DC
  Administered 2021-07-13 – 2021-07-14 (×3): 100 mg via ORAL
  Filled 2021-07-13 (×3): qty 2

## 2021-07-13 MED ORDER — PRASUGREL HCL 10 MG PO TABS
ORAL_TABLET | ORAL | Status: DC | PRN
  Administered 2021-07-13: 60 mg via ORAL

## 2021-07-13 MED ORDER — LIDOCAINE HCL 1 % IJ SOLN
INTRAMUSCULAR | Status: AC
  Filled 2021-07-13: qty 20

## 2021-07-13 MED ORDER — NITROGLYCERIN 0.4 MG SL SUBL: 0.4000 mg | SUBLINGUAL_TABLET | SUBLINGUAL | Status: DC | PRN

## 2021-07-13 MED ORDER — FENTANYL CITRATE (PF) 100 MCG/2ML IJ SOLN
INTRAMUSCULAR | Status: DC | PRN
  Administered 2021-07-13 (×2): 25 ug via INTRAVENOUS

## 2021-07-13 MED ORDER — SODIUM CHLORIDE 0.9 % IV SOLN: INTRAVENOUS | Status: AC

## 2021-07-13 MED ORDER — ASPIRIN 81 MG PO TBEC
81.0000 mg | DELAYED_RELEASE_TABLET | Freq: Every day | ORAL | Status: DC
  Administered 2021-07-13 – 2021-07-14 (×2): 81 mg via ORAL
  Filled 2021-07-13 (×2): qty 1

## 2021-07-13 MED ORDER — ACETAMINOPHEN 325 MG PO TABS
650.0000 mg | ORAL_TABLET | ORAL | Status: DC | PRN
Start: 2021-07-13 — End: 2021-07-14

## 2021-07-13 MED ORDER — HEPARIN SODIUM (PORCINE) 1000 UNIT/ML IJ SOLN
INTRAMUSCULAR | Status: DC | PRN
  Administered 2021-07-13 (×2): 4000 [IU] via INTRAVENOUS
  Administered 2021-07-13: 2000 [IU] via INTRAVENOUS

## 2021-07-13 MED ORDER — ASPIRIN 81 MG PO TBEC: 81.0000 mg | DELAYED_RELEASE_TABLET | Freq: Every day | ORAL | Status: DC

## 2021-07-13 SURGICAL SUPPLY — 20 items
BALLN TREK RX 2.5X12 (BALLOONS) ×2
BALLN ~~LOC~~ TREK NEO RX 3.5X8 (BALLOONS) ×2
BALLOON TREK RX 2.5X12 (BALLOONS) IMPLANT
BALLOON ~~LOC~~ TREK NEO RX 3.5X8 (BALLOONS) IMPLANT
CATH INFINITI 5 FR IM (CATHETERS) ×1 IMPLANT
CATH INFINITI 5 FR MPA2 (CATHETERS) ×1 IMPLANT
CATH VISTA GUIDE 6FR AL1 (CATHETERS) ×1 IMPLANT
DEVICE RAD COMP TR BAND LRG (VASCULAR PRODUCTS) ×1 IMPLANT
DRAPE BRACHIAL (DRAPES) ×1 IMPLANT
GLIDESHEATH SLEND SS 6F .021 (SHEATH) ×1 IMPLANT
GUIDEWIRE INQWIRE 1.5J.035X260 (WIRE) IMPLANT
INQWIRE 1.5J .035X260CM (WIRE) ×2
KIT ENCORE 26 ADVANTAGE (KITS) ×1 IMPLANT
PACK CARDIAC CATH (CUSTOM PROCEDURE TRAY) ×2 IMPLANT
PROTECTION STATION PRESSURIZED (MISCELLANEOUS) ×2
SET ATX SIMPLICITY (MISCELLANEOUS) ×1 IMPLANT
STATION PROTECTION PRESSURIZED (MISCELLANEOUS) IMPLANT
STENT ONYX FRONTIER 2.75X30 (Permanent Stent) ×1 IMPLANT
STENT ONYX FRONTIER 3.0X12 (Permanent Stent) ×1 IMPLANT
WIRE RUNTHROUGH .014X180CM (WIRE) ×1 IMPLANT

## 2021-07-13 NOTE — ED Provider Notes (Signed)
[facility] Provider Note    Event Date/Time   First MD Initiated Contact with Patient 07/12/21 2255     (approximate)   History   Chest Pain   HPI  Juan [name] is a 66 y.o. male with a history of extensive CAD status post CABG, 8 stents, GERD, hypertension, diabetes, hyperlipidemia who presents for evaluation of chest pain.  Patient reports the chest pain started while he was having sexual intercourse with his wife.  He describes the pain as a burning sensation in his chest associated with shortness of breath.  He took a nitro and the pain subsided but he returned.  He took a second nitro and that did not seem to help so his wife brought him to the ED.  At this point he reports that the pain is fully resolved.  He denies radiation to the back, pleuritic chest pain, personal or family history of PE or DVT, recent travel immobilization, leg pain or swelling, hemoptysis, exogenous hormones.  Pain does not radiate to the back or abdomen.     Past Medical History:  Diagnosis Date   Childhood asthma    Coronary artery disease    a. Dating back to 7 w/ h/o MI.  8 stents;  b. 10/2003 Cath [place]: LM 20d, RI 40p, LAD 30p, 79m patent stent, D1 50p, LCX 30p, patent mid stent, 90d/diffuse, RCA dominant, 30p, patent stent, 477m, EF 44%-->Med Rx.   Essential hypertension    GERD (gastroesophageal reflux disease)    Heart murmur    found during office visit   History of kidney stones    Hyperlipidemia    Hypothyroidism    Mild sleep apnea    "don't need mask" (11/29/2016)   [facility]   "[facility]"    Type II diabetes mellitus ([place]    Past Surgical History:  Procedure [place] Date   [place]   "total of 8 stents by now" (11/29/2016)   CARDIAC CATHETERIZATION N/A 01/21/2015   Procedure: Left Heart Cath and Coronary Angiography;   Surgeon: MuWellington HampshireMD;  [facility];  Service: Cardiovascular;  Laterality: N/A;   CARDIAC CATHETERIZATION  11/24/2016   "couldn't get stent in this time" (11/29/2016)   COLONOSCOPY WITH PROPOFOL N/A 04/17/2018   Procedure: COLONOSCOPY WITH PROPOFOL;  Surgeon: Tol[place], TeBenay PikeMD;  Location: [facility] ENDOSCOPY;  Service: Gastroenterology;  Laterality: N/A;   [place]  1996   ""   CORONARY ANGIOPLASTY WITH STENT PLACEMENT     ''''   CORONARY ANGIOPLASTY WITH STENT PLACEMENT     '''   CORONARY ANGIOPLASTY WITH STENT PLACEMENT     '''   CORONARY ANGIOPLASTY WITH STENT PLACEMENT  2006   "2 stents for a [facility] 10 [facility]    CORONARY ANGIOPLASTY WITH STENT PLACEMENT  11/29/2016   "1 stent"   CORONARY ARTERY BYPASS GRAFT N/A 01/29/2015   Procedure: CORONARY ARTERY BYPASS GRAFTING (CABG)x four using left internal mammary artery artery and bilateral saphenous thigh vein using endoscope.;  Surgeon: PeIvin PootMD;  [place];  Laterality: N/A;   CORONARY STENT INTERVENTION N/A 11/24/2016   Procedure: CORONARY STENT INTERVENTION;  Surgeon: ArWellington Hampshire  MD;  Location: [place] CV LAB;  Service: Cardiovascular;  Laterality: N/A;   CORONARY STENT INTERVENTION N/A 11/29/2016   Procedure: CORONARY STENT INTERVENTION;  Surgeon: Wellington Hampshire, MD;  Location: [place] [facility] LAB;  Service: Cardiovascular;  Laterality: N/A;   GANGLION CYST EXCISION Right    LEFT HEART CATH AND CORONARY ANGIOGRAPHY Left 11/24/2016   Procedure: LEFT HEART CATH AND CORONARY ANGIOGRAPHY;  Surgeon: Wellington Hampshire, MD;  Location: [place] [facility] LAB;  Service: Cardiovascular;  Laterality: Left;   NASAL SEPTUM SURGERY     TEE WITHOUT CARDIOVERSION N/A 01/29/2015   Procedure: TRANSESOPHAGEAL  ECHOCARDIOGRAM (TEE);  Surgeon: Ivin Poot, MD;  Location: [place];  Service: Open Heart Surgery;  Laterality: N/A;   THYROIDECTOMY       Physical Exam   Triage Vital Signs: ED Triage Vitals  Enc Vitals Group     BP 07/12/21 2228 (!) 152/73     Pulse Rate 07/12/21 2228 86     Resp 07/12/21 2228 20     Temp 07/12/21 2228 99.5 F (37.5 C)     Temp Source 07/12/21 2228 Oral     SpO2 07/12/21 2228 96 %     Weight 07/12/21 2229 181 lb (82.1 kg)     Height 07/12/21 2229 '5\' 8"'$  (1.727 m)     Head Circumference --      Peak Flow --      Pain Score 07/12/21 2229 5     Pain Loc --      Pain Edu? --      Excl. in [place]? --     Most recent vital signs: Vitals:   07/13/21 0000 07/13/21 0100  BP: (!) 146/75 138/72  Pulse: 83 78  Resp: 18 12  Temp:    SpO2: 99% 98%     Constitutional: Alert and oriented. Well appearing and in no apparent distress. HEENT:      Head: Normocephalic and atraumatic.         Eyes: Conjunctivae are normal. Sclera is non-icteric.       Mouth/Throat: Mucous membranes are moist.       Neck: Supple with no signs of meningismus. Cardiovascular: Regular rate and rhythm. No murmurs, gallops, or rubs. 2+ symmetrical distal pulses are present in all extremities.  Respiratory: Normal respiratory effort. Lungs are clear to auscultation bilaterally.  Gastrointestinal: Soft, non tender, and non distended with positive bowel sounds. No rebound or guarding. Genitourinary: No CVA tenderness. Musculoskeletal:  No edema, cyanosis, or erythema of extremities. Neurologic: Normal speech and language. Face is symmetric. Moving all extremities. No gross focal neurologic deficits are appreciated. Skin: Skin is warm, dry and intact. No rash noted. Psychiatric: Mood and affect are normal. Speech and behavior are normal.  ED Results / Procedures / Treatments   Labs (all labs ordered are listed, but only abnormal results are displayed) Labs Reviewed  BASIC METABOLIC PANEL -  Abnormal; Notable for the following components:      Result Value   Glucose, Bld 189 (*)    All other components within normal limits  CBC - Abnormal; Notable for the following components:   Platelets 119 (*)    All other components within normal limits  TROPONIN I (HIGH SENSITIVITY) - Abnormal; Notable for the following components:   Troponin I (High Sensitivity) 21 (*)    All other components within normal limits  TROPONIN I (HIGH SENSITIVITY) - Abnormal; Notable for the following components:   Troponin I (High  Sensitivity) 58 (*)    All other components within normal limits  PROTIME-INR  APTT     EKG  ED ECG REPORT I, Rudene Re, the attending physician, personally viewed and interpreted this ECG.  Sinus rhythm with a rate of 86, diffuse ST depressions in inferior and lateral leads with aVR elevation.  This depressions are seen on prior EKG although look worse today.  RADIOLOGY I, Rudene Re, attending MD, have personally viewed and interpreted the images obtained during this visit as below:  Chest x-ray is negative   ___________________________________________________ Interpretation by Radiologist:  DG Chest Port 1 View  Result Date: 07/12/2021 CLINICAL DATA:  Chest pain EXAM: PORTABLE CHEST 1 VIEW COMPARISON:  11/23/2016 FINDINGS: Prior CABG. Heart and mediastinal contours are within normal limits. No focal opacities or effusions. No acute bony abnormality. IMPRESSION: No active disease. Electronically Signed   By: Rolm Baptise M.D.   On: 07/12/2021 22:57       PROCEDURES:  Critical Care performed: Yes, see critical care procedure note(s)  .Critical Care  Performed by: Rudene Re, MD Authorized by: Rudene Re, MD   Critical care provider statement:    Critical care time (minutes):  30   Critical care time was exclusive of:  Separately billable procedures and treating other patients   Critical care was time spent personally by me on  the following activities:  Development of treatment plan with patient or surrogate, discussions with consultants, evaluation of patient's response to treatment, examination of patient, ordering and review of laboratory studies, ordering and review of radiographic studies, ordering and performing treatments and interventions, pulse oximetry, re-evaluation of patient's condition and review of old charts   I assumed direction of critical care for this patient from another provider in my specialty: no     Care discussed with: admitting provider       IMPRESSION / MDM / [place] / ED COURSE  I reviewed the triage vital signs and the nursing notes.  66 y.o. male with a history of extensive CAD status post CABG, 8 stents, GERD, hypertension, diabetes, hyperlipidemia who presents for evaluation of chest pain associated with shortness of breath during sexual intercourse.  After 2 sublingual nitro the pain had is lower subsided.  Upon arrival patient is pain-free, normal vital signs with no tachycardia or significant hypertension.  He has strong equal pulses in all 4 extremities.  His initial EKG shows diffuse ST depressions on inferior and lateral leads with aVR elevation which look worse when compared to prior from 10 days ago.  EKG does not meet criteria for STEMI.  Presentation is concerning for ACS.  Since patient is pain-free we will hold off on nitro at this time but will start patient on heparin bolus and drip, will give him a full aspirin.  We will cycle his cardiac enzymes, get basic metabolic panel, CBC, place him on telemetry for close monitoring of hemodynamics and consult the hospitalist for admission.  Doubt PE with fully resolved symptoms, no tachypnea, tachycardia, or hypoxia.  Doubt dissection with no severely elevated blood pressure, no pain radiating to the back, pain that has now resolved, and strong equal pulses in all 4 extremities and no neurological deficits.  MEDICATIONS GIVEN  IN ED: Medications  heparin ADULT infusion 100 units/mL (25000 units/279m) (1,050 Units/hr Intravenous New Bag/Given 07/13/21 0047)  aspirin chewable tablet 324 mg (324 mg Oral Given 07/13/21 0048)  heparin bolus via infusion 4,000 Units (4,000 Units Intravenous Bolus from Bag 07/13/21  [place])     ED COURSE: Second EKG unchanged when compared to prior.  First troponin is 21.  Normal metabolic panel with mild hyperglycemia no evidence of DKA, AKI or electrolyte derangements.  No signs of sepsis or anemia.  Chest x-ray is within normal limits.  Patient remains pain-free.  After discussion with hospitalist patient has been accepted to their service second troponin is pending.   _________________________ 1:18 AM on 07/13/2021 ----------------------------------------- 2nd trop trending up 58. On heparin , remains CP free    Consults: Hospitalist   EMR reviewed including records from his last visit with his cardiologist from a week ago for coronary artery disease    FINAL CLINICAL IMPRESSION(S) / ED DIAGNOSES   Final diagnoses:  NSTEMI (non-ST elevated myocardial infarction) ([place])     Rx / DC Orders   ED Discharge Orders     None        Note:  This document was prepared using Dragon voice recognition software and may include unintentional dictation errors.   Please note:  Patient was evaluated in Emergency Department today for the symptoms described in the history of present illness. Patient was evaluated in the context of the global COVID-19 pandemic, which necessitated consideration that the patient might be at risk for infection with the SARS-CoV-2 virus that causes COVID-19. Institutional protocols and algorithms that pertain to the evaluation of patients at risk for COVID-19 are in a state of rapid change based on information released by regulatory bodies including the CDC and federal and state organizations. These policies and algorithms were followed during the patient's care  in the ED.  Some ED evaluations and interventions may be delayed as a result of limited staffing during the pandemic.       Alfred Levins, [place], MD 07/13/21 [phone]

## 2021-07-13 NOTE — Assessment & Plan Note (Signed)
Plt 119k on admission. Monitor closely on heparin gtt.

## 2021-07-13 NOTE — Consult Note (Signed)
Cardiology Consultation:   Patient ID: Juan [name] MRN: [id number]; DOB: March 03, 1955  Admit date: 07/12/2021 Date of Consult: 07/13/2021  PCP:  [place] Pink, MD   [facility] Cardiologist:  Ida Rogue, MD        Patient Profile:   Juan [name] is a 66 y.o. male with a hx of CAD with numerous stents over the years s/p 4-vessel CABG in 12/2014, postoperative atrial fibrillation, DM2, essential hypertension, hyperlipidemia, and sleep apnea who is being seen 07/13/2021 for the evaluation of chest pain at the request of Dr. Damita Dunnings.  History of Present Illness:   Mr. Sedivy has a cardiac history dating back to 66 with numerous interventions over the years totaling :8 stents". He was admitted to the hospital in 12/2014 for unstable angina with LHC revealing 3-vessel disease with recommendation of CABG. He underwent 4-vessel CABG on 01/29/2015 with LIMA to LAD, SVG to OM1, SVG to OM2, SVG to PDA. Postoperative course was notable for atrial fibrillation without noted reoccurrence on repeated EKG's since. In 10/2016 he had developed symptoms concerning for angina with LHC on 11/24/2016 revealing significant 3-vessel CAD with a patent LIMA to LAD, patent SVG to OM1, occluded SVG to OM2 and occluded SVG to PDA. Attempted PCI of the native RCA was unsuccessful due to inability to advance the wire into the distal RCA as the result of a tortuosity and bifurcation with a large RV marginal. Low normal LVSF with an EF of 50% and normal LVEDP. Reattempt intervention to the RCA on 11/29/2016 was successful with complex PCI/DES itno the mid RCA. The procedure was overall very difficult due to calcified vessel with difficulty advancing equipment. The RV branch was jailed by the stent but had TIMI-3 flow. He was seen on the office on 07/06/2021 by Dr. Rockey Situ with complaints of exertional chest pain and fatigue. Subsequently he was started on Imdur. Last evening he had chest tightness  and pressure that he stated was substernal, without radiation, he stated that he took 1 nitrostat with relief when accompanied with rest. After a few minutes the pain returned and he took an additional Nitro without any relief of pain and came to the emergency department for further evaluation. Patient stated that once he was started on Heparin drip his pain was resolved. He did have some associated fatigue and shortness of breath and nausea without vomiting.  Initial vital signs: blood pressure 152/73, pulse 86, resp rate 20, temp 99.5  Pertinent labs:glucose 189, wbc 5.2, hgb 15.9, plts 119, Hs trops 21 and 58, sodium 140, potassium 4.4, creatinine 0.69, BUN 15  Imaging: CXR revealed no active disease  Medications:heparin infusion, ASA 324 mg  Past Medical History:  Diagnosis Date   Childhood asthma    Coronary artery disease    a. Dating back to 66 w/ h/o MI.  8 stents;  b. 10/2003 Cath [place]: LM 20d, RI 40p, LAD 30p, 14m patent stent, D1 50p, LCX 30p, patent mid stent, 90d/diffuse, RCA dominant, 30p, patent stent, 473m, EF 44%-->Med Rx.   Essential hypertension    GERD (gastroesophageal reflux disease)    Heart murmur    found during office visit   History of kidney stones    Hyperlipidemia    Hypothyroidism    Mild sleep apnea    "don't need mask" (11/29/2016)   [facility]   "[facility]"    [place]    Past Surgical History:  Procedure Laterality Date  [place]; Dr.Harold; [place]  2002   "total of 8 stents by now" (11/29/2016)   CARDIAC CATHETERIZATION N/A 01/21/2015   Procedure: Left Heart Cath and Coronary Angiography;  Surgeon: Wellington Hampshire, MD;  Location: [facility];  Service: Cardiovascular;  Laterality: N/A;   CARDIAC CATHETERIZATION  11/24/2016   "couldn't get stent in this time" (11/29/2016)   COLONOSCOPY WITH PROPOFOL N/A 04/17/2018    Procedure: COLONOSCOPY WITH PROPOFOL;  Surgeon: Toledo, Benay Pike, MD;  Location: [facility] ENDOSCOPY;  Service: Gastroenterology;  Laterality: N/A;   [place]; Dr. Joneen Boers; [place] WITH STENT PLACEMENT  1996   ""   CORONARY ANGIOPLASTY WITH STENT PLACEMENT     ''''   CORONARY ANGIOPLASTY WITH STENT PLACEMENT     '''   CORONARY ANGIOPLASTY WITH STENT PLACEMENT     '''   CORONARY ANGIOPLASTY WITH STENT PLACEMENT  2006   "2 stents for a total of 10 stents"; [facility]; [place]    CORONARY ANGIOPLASTY WITH STENT PLACEMENT  11/29/2016   "1 stent"   CORONARY ARTERY BYPASS GRAFT N/A 01/29/2015   Procedure: CORONARY ARTERY BYPASS GRAFTING (CABG)x four using left internal mammary artery artery and bilateral saphenous thigh vein using endoscope.;  Surgeon: Ivin Poot, MD;  Location: [place];  Service: Open Heart Surgery;  Laterality: N/A;   CORONARY STENT INTERVENTION N/A 11/24/2016   Procedure: CORONARY STENT INTERVENTION;  Surgeon: Wellington Hampshire, MD;  Location: [place] [facility];  Service: Cardiovascular;  Laterality: N/A;   CORONARY STENT INTERVENTION N/A 11/29/2016   Procedure: CORONARY STENT INTERVENTION;  Surgeon: Wellington Hampshire, MD;  Location: [place] [facility];  Service: Cardiovascular;  Laterality: N/A;   GANGLION CYST EXCISION Right    LEFT HEART CATH AND CORONARY ANGIOGRAPHY Left 11/24/2016   Procedure: LEFT HEART CATH AND CORONARY ANGIOGRAPHY;  Surgeon: Wellington Hampshire, MD;  Location: [place] [facility];  Service: Cardiovascular;  Laterality: Left;   NASAL SEPTUM SURGERY     TEE WITHOUT CARDIOVERSION N/A 01/29/2015   Procedure: TRANSESOPHAGEAL ECHOCARDIOGRAM (TEE);  Surgeon: Ivin Poot, MD;  Location: [place];  Service: Open Heart Surgery;  Laterality: N/A;   THYROIDECTOMY       Home Medications:  Prior to Admission medications   Medication Sig Start Date End Date Taking? Authorizing  Provider  Ascorbic Acid (VITAMIN C PO) Take 2 tablets by mouth daily.   Yes [provider]  aspirin EC 81 MG tablet Take 81 mg by mouth daily.   Yes [provider]  cholecalciferol (VITAMIN D3) 25 MCG (1000 UNIT) tablet Take 1,000 Units by mouth daily.   Yes [provider]  clopidogrel (PLAVIX) 75 MG tablet Take 1 tablet (75 mg total) by mouth daily. 07/04/21  Yes Gollan, Kathlene November, MD  Coenzyme Q10 (COQ10) 100 MG CAPS Take 100 mg by mouth daily.   Yes [provider]  empagliflozin (JARDIANCE) 25 MG TABS tablet Take 25 mg by mouth daily.   Yes [provider]  ibuprofen (ADVIL) 200 MG tablet Take 200 mg by mouth every 6 (six) hours as needed for mild pain.   Yes [provider]  isosorbide mononitrate (IMDUR) 30 MG 24 hr tablet Take 1 tablet (30 mg total) by mouth daily. 07/06/21  Yes Minna Merritts, MD  levothyroxine (SYNTHROID) 100 MCG tablet Take 1  tablet (100 mcg total) by mouth daily before breakfast. 02/04/15  Yes Dahlia Byes, MD  metFORMIN (GLUCOPHAGE-XR) 500 MG 24 hr tablet Take 1,000 mg by mouth 2 (two) times daily.   Yes [provider]  metoprolol tartrate (LOPRESSOR) 100 MG tablet Take 1 tablet (100 mg total) by mouth 2 (two) times daily. 07/04/21  Yes Gollan, Kathlene November, MD  Multiple Vitamin (MULTIVITAMIN WITH MINERALS) TABS tablet Take 1 tablet by mouth 2 (two) times daily.   Yes [provider]  Omega-3 Fatty Acids (FISH OIL PO) Take 900 mg by mouth daily.    Yes [provider]  ramipril (ALTACE) 10 MG capsule Take 1 capsule (10 mg total) by mouth daily. 07/04/21  Yes Minna Merritts, MD  simvastatin (ZOCOR) 40 MG tablet Take 1 tablet (40 mg total) by mouth every evening. 02/11/21  Yes Minna Merritts, MD  Continuous Blood Gluc Sensor MISC 1 each by Does not apply route as directed. Use as directed every 10 days. May dispense FreeStyle [employer] or similar. 11/30/16   Bhagat, Crista Luria, PA   nitroGLYCERIN (NITROSTAT) 0.4 MG SL tablet Place 1 tablet (0.4 mg total) under the tongue every 5 (five) minutes as needed for chest pain. 01/05/21   Minna Merritts, MD    Inpatient Medications: Scheduled Meds:  aspirin EC  81 mg Oral Daily   insulin aspart  0-15 Units Subcutaneous Q4H   isosorbide mononitrate  30 mg Oral Daily   levothyroxine  100 mcg Oral Q0600   metoprolol tartrate  100 mg Oral BID   ramipril  10 mg Oral Daily   simvastatin  40 mg Oral QPM   Continuous Infusions:  heparin 1,050 Units/hr (07/13/21 0513)   PRN Meds: acetaminophen, nitroGLYCERIN, ondansetron (ZOFRAN) IV  Allergies:   No Known Allergies  Social History:   Social History   Socioeconomic History   Marital status: Married    Spouse name: Not on file   Number of children: Not on file   Years of education: Not on file   Highest education level: Not on file  Occupational History   Not on file  Tobacco Use   Smoking status: Some Days    Types: Cigars   Smokeless tobacco: Never  Vaping Use   Vaping Use: Never used  Substance and Sexual Activity   Alcohol use: Yes    Comment: occ.    Drug use: No   Sexual activity: Yes  Other Topics Concern   Not on file  Social History Narrative   Married 35 years.   Exercise: walks 3-4 times a week   Diet: moderate   Social Determinants of Health   Financial Resource Strain: Not on file  Food Insecurity: Not on file  Transportation Needs: Not on file  Physical Activity: Not on file  Stress: Not on file  Social Connections: Not on file  Intimate Partner Violence: Not on file    Family History:    Family History  Problem Relation Age of Onset   Heart attack Mother    Heart attack Father    Cancer Father 92       early prostate cancer   Drug abuse Sister    Cirrhosis Sister    Emphysema Brother      ROS:  Please see the history of present illness.  Review of Systems  Constitutional:  Positive for malaise/fatigue.  HENT: Negative.     Eyes: Negative.   Respiratory:  Positive for shortness of  breath.   Cardiovascular:  Positive for chest pain.  Gastrointestinal:  Positive for nausea.  Genitourinary: Negative.   Musculoskeletal: Negative.   Skin: Negative.   Neurological: Negative.   Endo/Heme/Allergies: Negative.   Psychiatric/Behavioral: Negative.      All other ROS reviewed and negative.     Physical Exam/Data:   Vitals:   07/13/21 0932 07/13/21 0933 07/13/21 0934 07/13/21 0935  BP:    (!) 142/73  Pulse: 64 62 62 62  Resp: '15 17 12 14  '$ Temp:    98.3 F (36.8 C)  TempSrc:    Oral  SpO2: 98% 98% 99% 98%  Weight:      Height:       No intake or output data in the 24 hours ending 07/13/21 1034    07/12/2021   10:29 PM 07/06/2021   10:51 AM 01/05/2021    8:25 AM  Last 3 Weights  Weight (lbs) 181 lb 190 lb 2 oz 183 lb  Weight (kg) 82.101 kg 86.24 kg 83.008 kg     Body mass index is 27.52 kg/m.  General:  Well nourished, well developed, in no acute distress HEENT: normal, glasses on Neck: no JVD Vascular: No carotid bruits; Distal pulses 2+ bilaterally Cardiac:  normal S1, S2; RRR; II/VI systolic murmur  Lungs:  clear to auscultation bilaterally, no wheezing, rhonchi or rales  Abd: soft, nontender, no hepatomegaly  Ext: no edema Musculoskeletal:  No deformities, BUE and BLE strength normal and equal Skin: warm and dry  Neuro:  CNs 2-12 intact, no focal abnormalities noted Psych:  Normal affect   EKG:  The EKG was personally reviewed and demonstrates:  SR, ST depression with inverted T waves in the lateral leads Telemetry:  Telemetry was personally reviewed and demonstrates:  SR 70-80 with LVH  Relevant CV Studies: Echocardiogram completed on 12/07/2017 Left ventricle: The cavity size was normal. There was mild concentric hypertrophy. Systolic function was normal. The estimated ejection fraction was in the range of 55% to 60%. Wall motion was normal; there were no regional wall motion  abnormalities. Doppler parameters are consistent with abnormal left ventricular relaxation (grade 1 diastolic dysfunction).  - Aortic valve: Poorly visualized.  - Right ventricle: Systolic function was normal.  - Pulmonary arteries: Systolic pressure was within the normal    range.   LHC with graft visualization completed on 11/24/2016 Prox RCA to Mid RCA lesion, 40 %stenosed. RPDA lesion, 60 %stenosed. Ost 1st Mrg to 1st Mrg lesion, 90 %stenosed. Mid Cx lesion, 90 %stenosed. Ost Cx to Prox Cx lesion, 30 %stenosed. Ost Cx lesion, 99 %stenosed. Mid LAD to Dist LAD lesion, 10 %stenosed. Mid LAD lesion, 80 %stenosed. 1st Diag lesion, 70 %stenosed. Acute Mrg lesion, 70 %stenosed. SVG and is normal in caliber. SVG. Origin lesion, 100 %stenosed. SVG. Origin lesion, 100 %stenosed. Dist LAD lesion, 50 %stenosed. LIMA and is normal in caliber and anatomically normal. Prox RCA lesion, 60 %stenosed. A drug eluting stent was successfully placed. Mid RCA lesion, 95 %stenosed. Post intervention, there is a 0% residual stenosis. Dist RCA lesion, 55 %stenosed.   Successful complex angioplasty and drug-eluting stent placement to the mid right coronary artery. Very difficult procedure due to calcified vessel with difficulty advancing equipment.  The RV branch was jailed by the stent but had TIMI-3 flow. Laboratory Data:  High Sensitivity Troponin:   Recent Labs  Lab 07/12/21 2231 07/13/21 0033  TROPONINIHS 21* 58*     Chemistry Recent Labs  Lab 07/12/21 2231  NA 140  K 4.4  CL 104  CO2 24  GLUCOSE 189*  BUN 15  CREATININE 0.69  CALCIUM 9.6  GFRNONAA >60  ANIONGAP 12    No results for input(s): "PROT", "ALBUMIN", "AST", "ALT", "ALKPHOS", "BILITOT" in the last 168 hours. Lipids No results for input(s): "CHOL", "TRIG", "HDL", "LABVLDL", "LDLCALC", "CHOLHDL" in the last 168 hours.  Hematology Recent Labs  Lab 07/12/21 2231  WBC 5.2  RBC 4.95  HGB 15.9  HCT 47.4  MCV 95.8   MCH 32.1  MCHC 33.5  RDW 13.9  PLT 119*   Thyroid No results for input(s): "TSH", "FREET4" in the last 168 hours.  BNPNo results for input(s): "BNP", "PROBNP" in the last 168 hours.  DDimer No results for input(s): "DDIMER" in the last 168 hours.   Radiology/Studies:  DG Chest Port 1 View  Result Date: 07/12/2021 CLINICAL DATA:  Chest pain EXAM: PORTABLE CHEST 1 VIEW COMPARISON:  11/23/2016 FINDINGS: Prior CABG. Heart and mediastinal contours are within normal limits. No focal opacities or effusions. No acute bony abnormality. IMPRESSION: No active disease. Electronically Signed   By: Rolm Baptise M.D.   On: 07/12/2021 22:57     Assessment and Plan:   NSTEMI, elevated Hs troponins, CAD s/p CABG and 8 stents prior to CABG in 2016, last stent was placed in 2018 -continue ASA, imdur, metoprolol, ramipril, and simvastatin (patient has been intolerant of other statins) -continue heparin drip -LHC with grafts scheduled for this afternoon -remain NPO -EKG PRN changes -Continue cardiac monitor -echocardiogram ordered last study was in 2019  2.Essential hypertension -continue ramipril and metoprolol -blood pressures have been stable since admission -vital signs per unit protocol  3.Hyperlipidemia -continue simvastatin -patient has a history of statin intolerance and intolerance to zetia,  -LDL 83 in 12/2020 -triglycerides 380  4. Post-operative atrial fibrillation -current SR -no recurrent episodes -continue ASA -continue metoprolol  5. Diabetes -hemoglobin A1c 6 -monitors sugar with dexcom -metformin on hold and received ssc while in the hospital    Risk Assessment/Risk Scores:     TIMI Risk Score for Unstable Angina or Non-ST Elevation MI:   The patient's TIMI risk score is 5, which indicates a 26% risk of all cause mortality, new or recurrent myocardial infarction or need for urgent revascularization in the next 14 days.          For questions or updates,  please contact [place] Please consult [email or web address] for contact info under    Signed, Gerado Nabers, NP  07/13/2021 10:34 AM

## 2021-07-13 NOTE — Assessment & Plan Note (Signed)
Sliding scale insulin coverage 

## 2021-07-13 NOTE — Assessment & Plan Note (Addendum)
CAD s/p CABG and at least 8 stents prior to CABG in 2016, last stent in 2018 Heparin infusion Continue aspirin, isosorbide, metoprolol, ramipril and simvastatin Nitroglycerin sublingual as needed chest pain with morphine for breakthrough Cardiology consult.  Patient follows with Dr. Rockey Situ We will keep n.p.o. tonight

## 2021-07-13 NOTE — Assessment & Plan Note (Signed)
Continue metoprolol.  Not currently on systemic anticoagulation

## 2021-07-13 NOTE — Progress Notes (Signed)
Same day as admission rounding note.  Patient admitted after midnight with exertional chest pain shortness of breath dyspnea on exertion with mildly elevated and rising troponins.  Cardiology consulted and planning for cardiac cath later today.  Patient seen in the ED, holding for bed today.  Echocardiogram was being performed. Denies chest pain this morning.  Expresses some concern for hypoglycemia while he is n.p.o. given diabetes history.  He is hungry.  Otherwise no acute complaints at this time.   Exam: Awake and alert, no acute distress HEENT: Clear conjunctiva, moist mucous members, hearing grossly normal Respiratory: Normal respiratory effort on room air, symmetric chest rise Cardiovascular: 2+ pedal pulses bilaterally, no cyanosis, brisk cap refill Extremities: Moves all, no edema or cyanosis Neuro A&O x3, normal speech, grossly nonfocal exam   Assessment and plan -see full H&P by Dr. Damita Dunnings and follow-up on cardiology recommendations.  I have reviewed H&P and cardiology consult note.   --Cardiac cath planned for later today   No charge

## 2021-07-13 NOTE — H&P (View-Only) (Signed)
Cardiology Consultation:   Patient ID: Juan [name] MRN: [id number]; DOB: [birth year]  Admit date: 07/12/2021 Date of Consult: 07/13/2021  PCP:  [place] Pink, MD   [facility] Cardiologist:  Ida Rogue, MD        Patient Profile:   Juan [name] is a 66 y.o. male with a hx of CAD with numerous stents over the years s/p 4-vessel CABG in 12/2014, postoperative atrial fibrillation, DM2, essential hypertension, hyperlipidemia, and sleep apnea who is being seen 07/13/2021 for the evaluation of chest pain at the request of Dr. Damita Dunnings.  History of Present Illness:   Juan [name] has a cardiac history dating back to 13 with numerous interventions over the years totaling :8 stents". He was admitted to the hospital in 12/2014 for unstable angina with LHC revealing 3-vessel disease with recommendation of CABG. He underwent 4-vessel CABG on 01/29/2015 with LIMA to LAD, SVG to OM1, SVG to OM2, SVG to PDA. Postoperative course was notable for atrial fibrillation without noted reoccurrence on repeated EKG's since. In 10/2016 he had developed symptoms concerning for angina with LHC on 11/24/2016 revealing significant 3-vessel CAD with a patent LIMA to LAD, patent SVG to OM1, occluded SVG to OM2 and occluded SVG to PDA. Attempted PCI of the native RCA was unsuccessful due to inability to advance the wire into the distal RCA as the result of a tortuosity and bifurcation with a large RV marginal. Low normal LVSF with an EF of 50% and normal LVEDP. Reattempt intervention to the RCA on 11/29/2016 was successful with complex PCI/DES itno the mid RCA. The procedure was overall very difficult due to calcified vessel with difficulty advancing equipment. The RV branch was jailed by the stent but had TIMI-3 flow. He was seen on the office on 07/06/2021 by Dr. Rockey Situ with complaints of exertional chest pain and fatigue. Subsequently he was started on Imdur. Last evening he had chest tightness  and pressure that he stated was substernal, without radiation, he stated that he took 1 nitrostat with relief when accompanied with rest. After a few minutes the pain returned and he took an additional Nitro without any relief of pain and came to the emergency department for further evaluation. Patient stated that once he was started on Heparin drip his pain was resolved. He did have some associated fatigue and shortness of breath and nausea without vomiting.  Initial vital signs: blood pressure 152/73, pulse 86, resp rate 20, temp 99.5  Pertinent labs:glucose 189, wbc 5.2, hgb 15.9, plts 119, Hs trops 21 and 58, sodium 140, potassium 4.4, creatinine 0.69, BUN 15  Imaging: CXR revealed no active disease  Medications:heparin infusion, ASA 324 mg  Past Medical History:  Diagnosis Date   Childhood asthma    Coronary artery disease    a. Dating back to 56 w/ h/o MI.  8 stents;  b. 10/2003 Cath [facility]: LM 20d, RI 40p, LAD 30p, 56m patent stent, D1 50p, LCX 30p, patent mid stent, 90d/diffuse, RCA dominant, 30p, patent stent, 463m, EF 44%-->Med Rx.   Essential hypertension    GERD (gastroesophageal reflux disease)    Heart murmur    found during office visit   History of kidney stones    Hyperlipidemia    Hypothyroidism    Mild sleep apnea    "don't need mask" (11/29/2016)   [facility]   "[facility]"    Type II diabetes mellitus ([facility]    Past Surgical History:  Procedure Laterality Date  Micro; Dr.Harold; [place]  2002   "total of 8 stents by now" (11/29/2016)   CARDIAC CATHETERIZATION N/A 01/21/2015   Procedure: Left Heart Cath and Coronary Angiography;  Surgeon: Wellington Hampshire, MD;  Location: [place] [facility];  Service: Cardiovascular;  Laterality: N/A;   CARDIAC CATHETERIZATION  11/24/2016   "couldn't get stent in this time" (11/29/2016)   COLONOSCOPY WITH PROPOFOL N/A 04/17/2018    Procedure: COLONOSCOPY WITH PROPOFOL;  Surgeon: Toledo, Benay Pike, MD;  Location: [facility];  Service: Gastroenterology;  Laterality: N/A;   [place]; Dr. Joneen Boers; [place] WITH STENT PLACEMENT  1996   ""   CORONARY ANGIOPLASTY WITH STENT PLACEMENT     ''''   CORONARY ANGIOPLASTY WITH STENT PLACEMENT     '''   CORONARY ANGIOPLASTY WITH STENT PLACEMENT     '''   CORONARY ANGIOPLASTY WITH STENT PLACEMENT  2006   "2 stents for a total of 10 stents"; [facility]; [place]    CORONARY ANGIOPLASTY WITH STENT PLACEMENT  11/29/2016   "1 stent"   CORONARY ARTERY BYPASS GRAFT N/A 01/29/2015   Procedure: CORONARY ARTERY BYPASS GRAFTING (CABG)x four using left internal mammary artery artery and bilateral saphenous thigh vein using endoscope.;  Surgeon: Ivin Poot, MD;  Location: [place];  Service: Open Heart Surgery;  Laterality: N/A;   CORONARY STENT INTERVENTION N/A 11/24/2016   Procedure: CORONARY STENT INTERVENTION;  Surgeon: Wellington Hampshire, MD;  Location: [place] [facility];  Service: Cardiovascular;  Laterality: N/A;   CORONARY STENT INTERVENTION N/A 11/29/2016   Procedure: CORONARY STENT INTERVENTION;  Surgeon: Wellington Hampshire, MD;  Location: [place] [facility];  Service: Cardiovascular;  Laterality: N/A;   GANGLION CYST EXCISION Right    LEFT HEART CATH AND CORONARY ANGIOGRAPHY Left 11/24/2016   Procedure: LEFT HEART CATH AND CORONARY ANGIOGRAPHY;  Surgeon: Wellington Hampshire, MD;  Location: [place] [facility];  Service: Cardiovascular;  Laterality: Left;   NASAL SEPTUM SURGERY     TEE WITHOUT CARDIOVERSION N/A 01/29/2015   Procedure: TRANSESOPHAGEAL ECHOCARDIOGRAM (TEE);  Surgeon: Ivin Poot, MD;  Location: [place];  Service: Open Heart Surgery;  Laterality: N/A;   THYROIDECTOMY       Home Medications:  Prior to Admission medications   Medication Sig Start Date End Date Taking? Authorizing  Provider  Ascorbic Acid (VITAMIN C PO) Take 2 tablets by mouth daily.   Yes [provider]  aspirin EC 81 MG tablet Take 81 mg by mouth daily.   Yes [provider]  cholecalciferol (VITAMIN D3) 25 MCG (1000 UNIT) tablet Take 1,000 Units by mouth daily.   Yes [provider]  clopidogrel (PLAVIX) 75 MG tablet Take 1 tablet (75 mg total) by mouth daily. 07/04/21  Yes Gollan, Kathlene November, MD  Coenzyme Q10 (COQ10) 100 MG CAPS Take 100 mg by mouth daily.   Yes [provider]  empagliflozin (JARDIANCE) 25 MG TABS tablet Take 25 mg by mouth daily.   Yes [provider]  ibuprofen (ADVIL) 200 MG tablet Take 200 mg by mouth every 6 (six) hours as needed for mild pain.   Yes [provider]  isosorbide mononitrate (IMDUR) 30 MG 24 hr tablet Take 1 tablet (30 mg total) by mouth daily. 07/06/21  Yes Minna Merritts, MD  levothyroxine (SYNTHROID) 100 MCG tablet Take 1  tablet (100 mcg total) by mouth daily before breakfast. 02/04/15  Yes Dahlia Byes, MD  metFORMIN (GLUCOPHAGE-XR) 500 MG 24 hr tablet Take 1,000 mg by mouth 2 (two) times daily.   Yes [provider]  metoprolol tartrate (LOPRESSOR) 100 MG tablet Take 1 tablet (100 mg total) by mouth 2 (two) times daily. 07/04/21  Yes Gollan, Kathlene November, MD  Multiple Vitamin (MULTIVITAMIN WITH MINERALS) TABS tablet Take 1 tablet by mouth 2 (two) times daily.   Yes [provider]  Omega-3 Fatty Acids (FISH OIL PO) Take 900 mg by mouth daily.    Yes [provider]  ramipril (ALTACE) 10 MG capsule Take 1 capsule (10 mg total) by mouth daily. 07/04/21  Yes Minna Merritts, MD  simvastatin (ZOCOR) 40 MG tablet Take 1 tablet (40 mg total) by mouth every evening. 02/11/21  Yes Minna Merritts, MD  Continuous Blood Gluc Sensor MISC 1 each by Does not apply route as directed. Use as directed every 10 days. May dispense FreeStyle [employer] or similar. 11/30/16   Bhagat, Crista Luria, PA   nitroGLYCERIN (NITROSTAT) 0.4 MG SL tablet Place 1 tablet (0.4 mg total) under the tongue every 5 (five) minutes as needed for chest pain. 01/05/21   Minna Merritts, MD    Inpatient Medications: Scheduled Meds:  aspirin EC  81 mg Oral Daily   insulin aspart  0-15 Units Subcutaneous Q4H   isosorbide mononitrate  30 mg Oral Daily   levothyroxine  100 mcg Oral Q0600   metoprolol tartrate  100 mg Oral BID   ramipril  10 mg Oral Daily   simvastatin  40 mg Oral QPM   Continuous Infusions:  heparin 1,050 Units/hr (07/13/21 0513)   PRN Meds: acetaminophen, nitroGLYCERIN, ondansetron (ZOFRAN) IV  Allergies:   No Known Allergies  Social History:   Social History   Socioeconomic History   Marital status: Married    Spouse name: Not on file   Number of children: Not on file   Years of education: Not on file   Highest education level: Not on file  Occupational History   Not on file  Tobacco Use   Smoking status: Some Days    Types: Cigars   Smokeless tobacco: Never  Vaping Use   Vaping Use: Never used  Substance and Sexual Activity   Alcohol use: Yes    Comment: occ.    Drug use: No   Sexual activity: Yes  Other Topics Concern   Not on file  Social History Narrative   Married 35 years.   Exercise: walks 3-4 times a week   Diet: moderate   Social Determinants of Health   Financial Resource Strain: Not on file  Food Insecurity: Not on file  Transportation Needs: Not on file  Physical Activity: Not on file  Stress: Not on file  Social Connections: Not on file  Intimate Partner Violence: Not on file    Family History:    Family History  Problem Relation Age of Onset   Heart attack Mother    Heart attack Father    Cancer Father 61       early prostate cancer   Drug abuse Sister    Cirrhosis Sister    Emphysema Brother      ROS:  Please see the history of present illness.  Review of Systems  Constitutional:  Positive for malaise/fatigue.  HENT: Negative.     Eyes: Negative.   Respiratory:  Positive for shortness of  breath.   Cardiovascular:  Positive for chest pain.  Gastrointestinal:  Positive for nausea.  Genitourinary: Negative.   Musculoskeletal: Negative.   Skin: Negative.   Neurological: Negative.   Endo/Heme/Allergies: Negative.   Psychiatric/Behavioral: Negative.      All other ROS reviewed and negative.     Physical Exam/Data:   Vitals:   07/13/21 0932 07/13/21 0933 07/13/21 0934 07/13/21 0935  BP:    (!) 142/73  Pulse: 64 62 62 62  Resp: '15 17 12 14  '$ Temp:    98.3 F (36.8 C)  TempSrc:    Oral  SpO2: 98% 98% 99% 98%  Weight:      Height:       No intake or output data in the 24 hours ending 07/13/21 1034    07/12/2021   10:29 PM 07/06/2021   10:51 AM 01/05/2021    8:25 AM  Last 3 Weights  Weight (lbs) 181 lb 190 lb 2 oz 183 lb  Weight (kg) 82.101 kg 86.24 kg 83.008 kg     Body mass index is 27.52 kg/m.  General:  Well nourished, well developed, in no acute distress HEENT: normal, glasses on Neck: no JVD Vascular: No carotid bruits; Distal pulses 2+ bilaterally Cardiac:  normal S1, S2; RRR; II/VI systolic murmur  Lungs:  clear to auscultation bilaterally, no wheezing, rhonchi or rales  Abd: soft, nontender, no hepatomegaly  Ext: no edema Musculoskeletal:  No deformities, BUE and BLE strength normal and equal Skin: warm and dry  Neuro:  CNs 2-12 intact, no focal abnormalities noted Psych:  Normal affect   EKG:  The EKG was personally reviewed and demonstrates:  SR, ST depression with inverted T waves in the lateral leads Telemetry:  Telemetry was personally reviewed and demonstrates:  SR 70-80 with LVH  Relevant CV Studies: Echocardiogram completed on 12/07/2017 Left ventricle: The cavity size was normal. There was mild concentric hypertrophy. Systolic function was normal. The estimated ejection fraction was in the range of 55% to 60%. Wall motion was normal; there were no regional wall motion  abnormalities. Doppler parameters are consistent with abnormal left ventricular relaxation (grade 1 diastolic dysfunction).  - Aortic valve: Poorly visualized.  - Right ventricle: Systolic function was normal.  - Pulmonary arteries: Systolic pressure was within the normal    range.   LHC with graft visualization completed on 11/24/2016 Prox RCA to Mid RCA lesion, 40 %stenosed. RPDA lesion, 60 %stenosed. Ost 1st Mrg to 1st Mrg lesion, 90 %stenosed. Mid Cx lesion, 90 %stenosed. Ost Cx to Prox Cx lesion, 30 %stenosed. Ost Cx lesion, 99 %stenosed. Mid LAD to Dist LAD lesion, 10 %stenosed. Mid LAD lesion, 80 %stenosed. 1st Diag lesion, 70 %stenosed. Acute Mrg lesion, 70 %stenosed. SVG and is normal in caliber. SVG. Origin lesion, 100 %stenosed. SVG. Origin lesion, 100 %stenosed. Dist LAD lesion, 50 %stenosed. LIMA and is normal in caliber and anatomically normal. Prox RCA lesion, 60 %stenosed. A drug eluting stent was successfully placed. Mid RCA lesion, 95 %stenosed. Post intervention, there is a 0% residual stenosis. Dist RCA lesion, 55 %stenosed.   Successful complex angioplasty and drug-eluting stent placement to the mid right coronary artery. Very difficult procedure due to calcified vessel with difficulty advancing equipment.  The RV branch was jailed by the stent but had TIMI-3 flow. Laboratory Data:  High Sensitivity Troponin:   Recent Labs  Lab 07/12/21 2231 07/13/21 0033  TROPONINIHS 21* 58*     Chemistry Recent Labs  Lab 07/12/21 2231  NA 140  K 4.4  CL 104  CO2 24  GLUCOSE 189*  BUN 15  CREATININE 0.69  CALCIUM 9.6  GFRNONAA >60  ANIONGAP 12    No results for input(s): "PROT", "ALBUMIN", "AST", "ALT", "ALKPHOS", "BILITOT" in the last 168 hours. Lipids No results for input(s): "CHOL", "TRIG", "HDL", "LABVLDL", "LDLCALC", "CHOLHDL" in the last 168 hours.  Hematology Recent Labs  Lab 07/12/21 2231  WBC 5.2  RBC 4.95  HGB 15.9  HCT 47.4  MCV 95.8   MCH 32.1  MCHC 33.5  RDW 13.9  PLT 119*   Thyroid No results for input(s): "TSH", "FREET4" in the last 168 hours.  BNPNo results for input(s): "BNP", "PROBNP" in the last 168 hours.  DDimer No results for input(s): "DDIMER" in the last 168 hours.   Radiology/Studies:  DG Chest Port 1 View  Result Date: 07/12/2021 CLINICAL DATA:  Chest pain EXAM: PORTABLE CHEST 1 VIEW COMPARISON:  11/23/2016 FINDINGS: Prior CABG. Heart and mediastinal contours are within normal limits. No focal opacities or effusions. No acute bony abnormality. IMPRESSION: No active disease. Electronically Signed   By: Rolm Baptise M.D.   On: 07/12/2021 22:57     Assessment and Plan:   NSTEMI, elevated Hs troponins, CAD s/p CABG and 8 stents prior to CABG in 2016, last stent was placed in 2018 -continue ASA, imdur, metoprolol, ramipril, and simvastatin (patient has been intolerant of other statins) -continue heparin drip -LHC with grafts scheduled for this afternoon -remain NPO -EKG PRN changes -Continue cardiac monitor -echocardiogram ordered last study was in 2019  2.Essential hypertension -continue ramipril and metoprolol -blood pressures have been stable since admission -vital signs per unit protocol  3.Hyperlipidemia -continue simvastatin -patient has a history of statin intolerance and intolerance to zetia,  -LDL 83 in 12/2020 -triglycerides 380  4. Post-operative atrial fibrillation -current SR -no recurrent episodes -continue ASA -continue metoprolol  5. Diabetes -hemoglobin A1c 6 -monitors sugar with dexcom -metformin on hold and received ssc while in the hospital    Risk Assessment/Risk Scores:     TIMI Risk Score for Unstable Angina or Non-ST Elevation MI:   The patient's TIMI risk score is 5, which indicates a 26% risk of all cause mortality, new or recurrent myocardial infarction or need for urgent revascularization in the next 14 days.          For questions or updates,  please contact [place] Please consult [email or web address] for contact info under    Signed, Saralyn Willison, NP  07/13/2021 10:34 AM

## 2021-07-13 NOTE — H&P (Signed)
History and Physical    Patient: Juan [name] YPP:[id number] DOB: [birth year] DOA: 07/12/2021 DOS: the patient was seen and examined on 07/13/2021 PCP: Maryland Pink, MD  Patient coming from: Home  Chief Complaint:  Chief Complaint  Patient presents with   Chest Pain    HPI: Juan [name] is a 66 y.o. male with medical history significant for Extensive CAD with multiple stents prior to CABG in 2016 and another stent in 2018, HTN, DM who presents with exertional chest pain and shortness of breath persistent after 2 sublingual nitro's.  By arrival in the ED pain had resolved.  He was last seen by his cardiologist, Dr. Rockey Situ a couple weeks prior. ED course and data review: BP 152/73, temp 99.5 with otherwise normal vitals. Troponin 21-58.  Labs otherwise unremarkable except for low platelets of 119,000. EKG, personally reviewed and interpreted showed diffuse ST depression.Chest x-ray showed no active disease. Patient was started on a heparin infusion given chewable aspirin and hospitalist consulted for admission.   Review of Systems: As mentioned in the history of present illness. All other systems reviewed and are negative.  Past Medical History:  Diagnosis Date   Childhood asthma    Coronary artery disease    a. 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  8 stents;  b. 10/2003 Cath AL: LM 20d, RI 40p, LAD 30p, 103m patent stent, D1 50p, LCX 30p, patent mid stent, 90d/diffuse, RCA dominant, 30p, patent stent, 421m, EF 44%-->Med Rx.   Essential hypertension    GERD (gastroesophageal reflux disease)    Heart murmur    found during office visit   History of kidney stones    Hyperlipidemia    Hypothyroidism    Mild sleep apnea    "don't need mask" (11/29/2016)   [facility]   "[facility]"    Type II diabetes mellitus ([place]   Past Surgical History:  Procedure Laterality Date   [facility] [place]   "total of 8 stents by now" (11/29/2016)   CARDIAC CATHETERIZATION N/A 01/21/2015   Procedure: Left Heart Cath and Coronary Angiography;  Surgeon: MuWellington HampshireMD;  [facility];  Service: Cardiovascular;  Laterality: N/A;   CARDIAC CATHETERIZATION  11/24/2016   "couldn't get stent in this time" (11/29/2016)   COLONOSCOPY WITH PROPOFOL N/A 04/17/2018   Procedure: COLONOSCOPY WITH PROPOFOL;  Surgeon: Toledo, TeBenay PikeMD;  Location: [facility];  Service: Gastroenterology;  Laterality: N/A;   [place] [facility]  1996   ""   CORONARY ANGIOPLASTY WITH STENT PLACEMENT     ''''   CORONARY ANGIOPLASTY WITH STENT PLACEMENT     '''   CORONARY ANGIOPLASTY WITH STENT PLACEMENT     '''   CORONARY ANGIOPLASTY WITH STENT PLACEMENT  2006   "2 stents for a [facility] 10 [facility]    CORONARY ANGIOPLASTY WITH STENT PLACEMENT  11/29/2016   "1 stent"   CORONARY ARTERY BYPASS GRAFT N/A 01/29/2015   Procedure: CORONARY ARTERY BYPASS GRAFTING (CABG)x four using left internal mammary artery artery and bilateral saphenous thigh vein using endoscope.;  Surgeon: PeIvin PootMD;  [place] Service: Open Heart Surgery;  Laterality: N/A;   CORONARY STENT INTERVENTION N/A 11/24/2016   Procedure: CORONARY STENT INTERVENTION;  Surgeon: Wellington Hampshire, MD;  Location: [place] [facility];  Service: Cardiovascular;  Laterality: N/A;   CORONARY STENT INTERVENTION N/A 11/29/2016   Procedure: CORONARY STENT INTERVENTION;  Surgeon: Wellington Hampshire, MD;  Location: [place] [facility];  Service: Cardiovascular;  Laterality: N/A;   GANGLION CYST EXCISION Right    LEFT HEART CATH AND CORONARY ANGIOGRAPHY Left 11/24/2016   Procedure: LEFT HEART CATH AND CORONARY ANGIOGRAPHY;  Surgeon: Wellington Hampshire, MD;  Location: [place]  [facility];  Service: Cardiovascular;  Laterality: Left;   NASAL SEPTUM SURGERY     TEE WITHOUT CARDIOVERSION N/A 01/29/2015   Procedure: TRANSESOPHAGEAL ECHOCARDIOGRAM (TEE);  Surgeon: Ivin Poot, MD;  Location: [place];  Service: Open Heart Surgery;  Laterality: N/A;   THYROIDECTOMY     Social History:  reports that he has been smoking cigars. He has never used smokeless tobacco. He reports current alcohol use. He reports that he does not use drugs.  No Known Allergies  Family History  Problem Relation Age of Onset   Heart attack Mother    Heart attack Father    Cancer Father 45       early prostate cancer   Drug abuse Sister    Cirrhosis Sister    Emphysema Brother     Prior to Admission medications   Medication Sig Start Date End Date Taking? Authorizing Provider  Ascorbic Acid (VITAMIN C PO) Take 2 tablets by mouth daily.   Yes [provider]  aspirin EC 81 MG tablet Take 81 mg by mouth daily.   Yes [provider]  cholecalciferol (VITAMIN D3) 25 MCG (1000 UNIT) tablet Take 1,000 Units by mouth daily.   Yes [provider]  clopidogrel (PLAVIX) 75 MG tablet Take 1 tablet (75 mg total) by mouth daily. 07/04/21  Yes Gollan, Kathlene November, MD  Coenzyme Q10 (COQ10) 100 MG CAPS Take 100 mg by mouth daily.   Yes [provider]  empagliflozin (JARDIANCE) 25 MG TABS tablet Take 25 mg by mouth daily.   Yes [provider]  ibuprofen (ADVIL) 200 MG tablet Take 200 mg by mouth every 6 (six) hours as needed for mild pain.   Yes [provider]  isosorbide mononitrate (IMDUR) 30 MG 24 hr tablet Take 1 tablet (30 mg total) by mouth daily. 07/06/21  Yes Minna Merritts, MD  levothyroxine (SYNTHROID) 100 MCG tablet Take 1 tablet (100 mcg total) by mouth daily before breakfast. 02/04/15  Yes Dahlia Byes, MD  metFORMIN (GLUCOPHAGE-XR) 500 MG 24 hr tablet Take 1,000 mg by mouth 2 (two) times daily.   Yes [provider]  metoprolol  tartrate (LOPRESSOR) 100 MG tablet Take 1 tablet (100 mg total) by mouth 2 (two) times daily. 07/04/21  Yes Gollan, Kathlene November, MD  Multiple Vitamin (MULTIVITAMIN WITH MINERALS) TABS tablet Take 1 tablet by mouth 2 (two) times daily.   Yes [provider]  Omega-3 Fatty Acids (FISH OIL PO) Take 900 mg by mouth daily.    Yes [provider]  ramipril (ALTACE) 10 MG capsule Take 1 capsule (10 mg total) by mouth daily. 07/04/21  Yes Minna Merritts, MD  simvastatin (ZOCOR) 40 MG tablet Take 1 tablet (40 mg total) by mouth every evening. 02/11/21  Yes Minna Merritts, MD  Continuous Blood Gluc Sensor MISC 1 each by Does not apply route as directed. Use as directed every 10 days. May dispense FreeStyle [employer] or similar. 11/30/16  Bhagat, Bhavinkumar, PA  nitroGLYCERIN (NITROSTAT) 0.4 MG SL tablet Place 1 tablet (0.4 mg total) under the tongue every 5 (five) minutes as needed for chest pain. 01/05/21   Minna Merritts, MD    Physical Exam: Vitals:   07/13/21 0000 07/13/21 0100 07/13/21 0130 07/13/21 0200  BP: (!) 146/75 138/72 140/76 131/67  Pulse: 83 78 79 75  Resp: '18 12 15 18  '$ Temp:      TempSrc:      SpO2: 99% 98% 98% 96%  Weight:      Height:       Physical Exam Vitals and nursing note reviewed.  Constitutional:      General: He is not in acute distress. HENT:     Head: Normocephalic and atraumatic.  Cardiovascular:     Rate and Rhythm: Normal rate and regular rhythm.     Heart sounds: Normal heart sounds.  Pulmonary:     Effort: Pulmonary effort is normal.     Breath sounds: Normal breath sounds.  Abdominal:     Palpations: Abdomen is soft.     Tenderness: There is no abdominal tenderness.  Neurological:     Mental Status: Mental status is at baseline.     Labs on Admission: I have personally reviewed following labs and imaging studies  CBC: Recent Labs  Lab 07/12/21 2231  WBC 5.2  HGB 15.9  HCT 47.4  MCV 95.8  PLT 119*   Basic  Metabolic Panel: Recent Labs  Lab 07/12/21 2231  NA 140  K 4.4  CL 104  CO2 24  GLUCOSE 189*  BUN 15  CREATININE 0.69  CALCIUM 9.6   GFR: Estimated Creatinine Clearance: 97.5 mL/min (by C-G formula based on SCr of 0.69 mg/dL). Liver Function Tests: No results for input(s): "AST", "ALT", "ALKPHOS", "BILITOT", "PROT", "ALBUMIN" in the last 168 hours. No results for input(s): "LIPASE", "AMYLASE" in the last 168 hours. No results for input(s): "AMMONIA" in the last 168 hours. Coagulation Profile: Recent Labs  Lab 07/13/21 0033  INR 1.1   Cardiac Enzymes: No results for input(s): "CKTOTAL", "CKMB", "CKMBINDEX", "TROPONINI" in the last 168 hours. BNP (last 3 results) No results for input(s): "PROBNP" in the last 8760 hours. HbA1C: No results for input(s): "HGBA1C" in the last 72 hours. CBG: No results for input(s): "GLUCAP" in the last 168 hours. Lipid Profile: No results for input(s): "CHOL", "HDL", "LDLCALC", "TRIG", "CHOLHDL", "LDLDIRECT" in the last 72 hours. Thyroid Function Tests: No results for input(s): "TSH", "T4TOTAL", "FREET4", "T3FREE", "THYROIDAB" in the last 72 hours. Anemia Panel: No results for input(s): "VITAMINB12", "FOLATE", "FERRITIN", "TIBC", "IRON", "RETICCTPCT" in the last 72 hours. Urine analysis:    Component Value Date/Time   COLORURINE YELLOW 01/27/2015 1534   APPEARANCEUR CLEAR 01/27/2015 1534   LABSPEC 1.033 (H) 01/27/2015 1534   PHURINE 5.5 01/27/2015 1534   GLUCOSEU >1000 (A) 01/27/2015 1534   HGBUR NEGATIVE 01/27/2015 1534   BILIRUBINUR NEGATIVE 01/27/2015 1534   KETONESUR 15 (A) 01/27/2015 1534   PROTEINUR NEGATIVE 01/27/2015 1534   NITRITE NEGATIVE 01/27/2015 1534   LEUKOCYTESUR NEGATIVE 01/27/2015 1534    Radiological Exams on Admission: DG Chest Port 1 View  Result Date: 07/12/2021 CLINICAL DATA:  Chest pain EXAM: PORTABLE CHEST 1 VIEW COMPARISON:  11/23/2016 FINDINGS: Prior CABG. Heart and mediastinal contours are within normal  limits. No focal opacities or effusions. No acute bony abnormality. IMPRESSION: No active disease. Electronically Signed   By: Rolm Baptise M.D.   On: 07/12/2021 22:57  Data Reviewed: Relevant notes from primary care and specialist visits, past discharge summaries as available in EHR, including Care Everywhere. Prior diagnostic testing as pertinent to current admission diagnoses Updated medications and problem lists for reconciliation ED course, including vitals, labs, imaging, treatment and response to treatment Triage notes, nursing and pharmacy notes and ED provider's notes Notable results as noted in HPI   Assessment and Plan: * NSTEMI (non-ST elevated myocardial infarction) ([place]) CAD s/p CABG and at least 8 stents prior to CABG in 2016, last stent in 2018 Heparin infusion Continue aspirin, isosorbide, metoprolol, ramipril and simvastatin Nitroglycerin sublingual as needed chest pain with morphine for breakthrough Cardiology consult.  Patient follows with Dr. Rockey Situ We will keep n.p.o. tonight  Paroxysmal atrial fibrillation ([place]) Continue metoprolol.  Not currently on systemic anticoagulation  Type II diabetes mellitus (HCC) Sliding scale insulin coverage  Post-surgical hypothyroidism Continue Synthroid  Essential hypertension BP controlled Continue metoprolol and ramipril        DVT prophylaxis: heparin  Consults: [facility] cardiology Dr. Caryl Comes  Advance Care Planning:   Code Status: Prior   Family Communication: wife at bedside  Disposition Plan: Back to previous home environment  Severity of Illness: The appropriate patient status for this patient is INPATIENT. Inpatient status is judged to be reasonable and necessary in order to provide the required intensity of service to ensure the patient's safety. The patient's presenting symptoms, physical exam findings, and initial radiographic and laboratory data in the context of their chronic comorbidities is felt to  place them at high risk for further clinical deterioration. Furthermore, it is not anticipated that the patient will be medically stable for discharge from the hospital within 2 midnights of admission.   * I certify that at the point of admission it is my clinical judgment that the patient will require inpatient hospital care spanning beyond 2 midnights from the point of admission due to high intensity of service, high risk for further deterioration and high frequency of surveillance required.*  Author: Athena Masse, MD 07/13/2021 2:32 AM  For on call review [email or web address].

## 2021-07-13 NOTE — Progress Notes (Signed)
*  PRELIMINARY RESULTS* Echocardiogram 2D Echocardiogram has been performed.  Sherrie Sport 07/13/2021, 12:52 PM

## 2021-07-13 NOTE — Assessment & Plan Note (Signed)
BP controlled Continue metoprolol and ramipril

## 2021-07-13 NOTE — Assessment & Plan Note (Signed)
Continue Synthroid °

## 2021-07-13 NOTE — ED Notes (Signed)
RN checked pt BGL, pt also checked his own BGL. RN got a reading with the glucometer of 149 and pt got a reading of 110 on his dexacom. Pt declined insulin at this time.

## 2021-07-13 NOTE — Interval H&P Note (Signed)
History and Physical Interval Note:  07/13/2021 3:52 PM  Juan [name]  has presented today for surgery, with the diagnosis of NSTEMI.  The various methods of treatment have been discussed with the patient and family. After consideration of risks, benefits and other options for treatment, the patient has consented to  Procedure(s): LEFT HEART CATH AND CORONARY ANGIOGRAPHY (N/A) as a surgical intervention.  The patient's history has been reviewed, patient examined, no change in status, stable for surgery.  I have reviewed the patient's chart and labs.  Questions were answered to the patient's satisfaction.    Cath Lab Visit (complete for each Cath Lab visit)  Clinical Evaluation Leading to the Procedure:   ACS: Yes.    Non-ACS:  N/A  Annet Manukyan

## 2021-07-13 NOTE — Progress Notes (Signed)
[place] for heparin infusion Indication: ACS/STEMI  No Known Allergies  Patient Measurements: Height: '5\' 8"'$  (172.7 cm) Weight: 82.1 kg (181 lb) IBW/kg (Calculated) : 68.4 Heparin Dosing Weight: 82.1 kg  Vital Signs: Temp: 99.5 F (37.5 C) (06/13 2228) Temp Source: Oral (06/13 2228) BP: 131/76 (06/14 0700) Pulse Rate: 71 (06/14 0700)  Labs: Recent Labs    07/12/21 2231 07/13/21 0033 07/13/21 0633  HGB 15.9  --   --   HCT 47.4  --   --   PLT 119*  --   --   APTT  --  32  --   LABPROT  --  14.6  --   INR  --  1.1  --   HEPARINUNFRC  --   --  0.32  CREATININE 0.69  --   --   TROPONINIHS 21* 58*  --      Estimated Creatinine Clearance: 97.5 mL/min (by C-G formula based on SCr of 0.69 mg/dL).   Medical History: Past Medical History:  Diagnosis Date   Childhood asthma    Coronary artery disease    a. Dating back to 37 w/ h/o MI.  8 stents;  b. 10/2003 Cath AL: LM 20d, RI 40p, LAD 30p, 46m patent stent, D1 50p, LCX 30p, patent mid stent, 90d/diffuse, RCA dominant, 30p, patent stent, 435m, EF 44%-->Med Rx.   Essential hypertension    GERD (gastroesophageal reflux disease)    Heart murmur    found during office visit   History of kidney stones    Hyperlipidemia    Hypothyroidism    Mild sleep apnea    "don't need mask" (11/29/2016)   [facility]   "small [facility]"    Type II diabetes mellitus ([place]    Assessment: Pt is [age]o male with h/o CAD, presenting with exertional CP & SOB persistent after sublingual NTG x 2.  EKG showing "diffuse ST depression."  Goal of Therapy:  Heparin level 0.3-0.7 units/ml Monitor platelets by anticoagulation protocol: Yes   Results: Date/time aPTT/HL Comments 6/14'@0633'$  HL 0.32 Therapeutic x1  Plan:  Continue Heparin infusion at 1050 units/hr Will check HL in 6 hrs to confirm level CBC daily while on heparin  Alaina Donati Rodriguez-Guzman PharmD, BCPS 07/13/2021 7:46  AM

## 2021-07-14 ENCOUNTER — Other Ambulatory Visit (HOSPITAL_COMMUNITY): Payer: Self-pay

## 2021-07-14 ENCOUNTER — Encounter: Payer: Self-pay | Admitting: Internal Medicine

## 2021-07-14 DIAGNOSIS — I10 Essential (primary) hypertension: Secondary | ICD-10-CM

## 2021-07-14 DIAGNOSIS — Z955 Presence of coronary angioplasty implant and graft: Secondary | ICD-10-CM

## 2021-07-14 DIAGNOSIS — E1159 Type 2 diabetes mellitus with other circulatory complications: Secondary | ICD-10-CM

## 2021-07-14 DIAGNOSIS — I480 Paroxysmal atrial fibrillation: Secondary | ICD-10-CM

## 2021-07-14 DIAGNOSIS — D696 Thrombocytopenia, unspecified: Secondary | ICD-10-CM

## 2021-07-14 LAB — BASIC METABOLIC PANEL
Anion gap: 8 (ref 5–15)
BUN: 17 mg/dL (ref 8–23)
CO2: 24 mmol/L (ref 22–32)
Calcium: 8.8 mg/dL — ABNORMAL LOW (ref 8.9–10.3)
Chloride: 109 mmol/L (ref 98–111)
Creatinine, Ser: 0.7 mg/dL (ref 0.61–1.24)
GFR, Estimated: >60 mL/min (ref >60)
Glucose, Bld: 135 mg/dL — ABNORMAL HIGH (ref 70–99)
Potassium: 4.3 mmol/L (ref 3.5–5.1)
Sodium: 141 mmol/L (ref 135–145)

## 2021-07-14 LAB — CBC
HCT: 43.9 % (ref 39.0–52.0)
Hemoglobin: 15 g/dL (ref 13.0–17.0)
MCH: 32.3 pg (ref 26.0–34.0)
MCHC: 34.2 g/dL (ref 30.0–36.0)
MCV: 94.4 fL (ref 80.0–100.0)
Platelets: 96 10*3/uL — ABNORMAL LOW (ref 150–400)
RBC: 4.65 MIL/uL (ref 4.22–5.81)
RDW: 14 % (ref 11.5–15.5)
WBC: 4.7 10*3/uL (ref 4.0–10.5)
nRBC: 0 % (ref 0.0–0.2)

## 2021-07-14 LAB — MAGNESIUM: Magnesium: 2.3 mg/dL (ref 1.7–2.4)

## 2021-07-14 LAB — GLUCOSE, CAPILLARY
Glucose-Capillary: 138 mg/dL — ABNORMAL HIGH (ref 70–99)
Glucose-Capillary: 148 mg/dL — ABNORMAL HIGH (ref 70–99)

## 2021-07-14 LAB — LIPOPROTEIN A (LPA): Lipoprotein (a): 142.8 nmol/L — ABNORMAL HIGH (ref <75.0)

## 2021-07-14 MED ORDER — PRASUGREL HCL 10 MG PO TABS: 10.0000 mg | ORAL_TABLET | Freq: Every day | ORAL | 1 refills | Status: DC

## 2021-07-14 NOTE — TOC Benefit Eligibility Note (Signed)
Patient Teacher, English as a foreign language completed.    The patient is currently admitted and upon discharge could be taking prasugrel (Effient) 10 mg tablets.  The current 30 day co-pay is, $42.06.   The patient is insured through [place], [place] Patient Advocate Specialist [place] Patient Advocate Team Direct Number: [phone]  Fax: [phone]

## 2021-07-14 NOTE — Progress Notes (Signed)
Cardiology Progress Note   Patient Name: Juan [name] Date of Encounter: 07/14/2021  Primary Cardiologist: Ida Rogue, MD  Subjective   Reports feeling well this morning, no complaints No chest pain, no shortness of breath Blood pressure stable, radial access site stable Discussed recent events leading up to presentation, had chest pain that did not resolve with nitro sublingual x2 Difficult time tolerating isosorbide secondary to flushing, thinks he will cut it in half twice a day   Inpatient Medications    Scheduled Meds:  aspirin EC  81 mg Oral Daily   enoxaparin (LOVENOX) injection  40 mg Subcutaneous Q24H   insulin aspart  0-15 Units Subcutaneous Q4H   isosorbide mononitrate  30 mg Oral Daily   levothyroxine  100 mcg Oral Q0600   metoprolol tartrate  100 mg Oral BID   prasugrel  10 mg Oral Daily   ramipril  10 mg Oral Daily   simvastatin  40 mg Oral QPM   sodium chloride flush  3 mL Intravenous Q12H   sodium chloride flush  3 mL Intravenous Q12H   Continuous Infusions:  sodium chloride     PRN Meds: sodium chloride, acetaminophen, nitroGLYCERIN, ondansetron (ZOFRAN) IV, sodium chloride flush   Vital Signs    Vitals:   07/14/21 0231 07/14/21 0302 07/14/21 0332 07/14/21 0715  BP: 111/62 106/62 (!) 107/59 120/61  Pulse: (!) 59 (!) 56 (!) 57 60  Resp:   16 18  Temp:    98.5 F (36.9 C)  TempSrc:    Oral  SpO2: 96% 98% 97% 97%  Weight:      Height:       No intake or output data in the 24 hours ending 07/14/21 0944 Filed Weights   07/12/21 2229  Weight: 82.1 kg    Physical Exam   GEN: Well nourished, well developed, in no acute distress.  HEENT: Grossly normal.  Neck: Supple, no JVD, carotid bruits, or masses. Cardiac: RRR, no murmurs, rubs, or gallops. No clubbing, cyanosis, edema.  Radials 2+, DP/PT 2+ and equal bilaterally.  Respiratory:  Respirations regular and unlabored, clear to auscultation bilaterally. GI: Soft, nontender,  nondistended, BS + x 4. MS: no deformity or atrophy. Skin: warm and dry, no rash. Neuro:  Strength and sensation are intact. Psych: AAOx3.  Normal affect.  Labs    Chemistry Recent Labs  Lab 07/12/21 2231 07/14/21 0630  NA 140 141  K 4.4 4.3  CL 104 109  CO2 24 24  GLUCOSE 189* 135*  BUN 15 17  CREATININE 0.69 0.70  CALCIUM 9.6 8.8*  GFRNONAA >60 >60  ANIONGAP 12 8     Hematology Recent Labs  Lab 07/12/21 2231 07/14/21 0630  WBC 5.2 4.7  RBC 4.95 4.65  HGB 15.9 15.0  HCT 47.4 43.9  MCV 95.8 94.4  MCH 32.1 32.3  MCHC 33.5 34.2  RDW 13.9 14.0  PLT 119* 96*    Cardiac Enzymes  Recent Labs  Lab 07/12/21 2231 07/13/21 0033 07/13/21 1053  TROPONINIHS 21* 58* 522*    Lipids  Lab Results  Component Value Date   CHOL 197 02/16/2014   HDL 34.10 (L) 02/16/2014   LDLCALC 61 04/11/2013   LDLDIRECT 84.0 02/16/2014   TRIG 402.0 (H) 02/16/2014   CHOLHDL 6 02/16/2014    HbA1c  Lab Results  Component Value Date   HGBA1C 6.5 (H) 07/13/2021    Radiology    DG Chest Port 1 View  Result Date: 07/12/2021 CLINICAL DATA:  Chest pain EXAM: PORTABLE CHEST 1 VIEW COMPARISON:  11/23/2016 FINDINGS: Prior CABG. Heart and mediastinal contours are within normal limits. No focal opacities or effusions. No acute bony abnormality. IMPRESSION: No active disease. Electronically Signed   By: Rolm Baptise M.D.   On: 07/12/2021 22:57    Telemetry    Normal sinus rhythm- Personally Reviewed  ECG     - Personally Reviewed  Cardiac Studies   2D Echocardiogram 6.14.2023  1. Left ventricular ejection fraction, by estimation, is 50 to 55%. The  left ventricle has low normal function. The left ventricle has no regional  wall motion abnormalities. There is mild left ventricular hypertrophy.  Left ventricular diastolic  parameters were normal.   2. Right ventricular systolic function was not well visualized. The right  ventricular size is normal.   3. The mitral valve is  normal in structure. No evidence of mitral valve  regurgitation.   4. The aortic valve was not well visualized. Aortic valve regurgitation  is trivial. Aortic valve sclerosis is present, with no evidence of aortic  valve stenosis.  _____________   Cardiac Catheterization and Percutaneous Coronary Intervention 6.14.2023  Left Main  Vessel is large. Vessel is angiographically normal.  Left Anterior Descending  Vessel is large.  Prox LAD to Mid LAD lesion is 70% stenosed. The lesion is eccentric. The lesion is severely calcified.  Mid LAD-1 lesion is 85% stenosed.  Mid LAD-2 lesion is 20% stenosed. The lesion was previously treated .  Mid LAD-3 lesion is 100% stenosed. The lesion is chronically occluded.  Dist LAD lesion is 50% stenosed.  First Diagonal Branch  Vessel is small in size. There is severe disease in the vessel.  Second Diagonal Branch  Vessel is small in size. There is severe disease in the vessel.  Left Circumflex  Vessel is large.  Ost Cx to Prox Cx lesion is 90% stenosed.  Prox Cx to Mid Cx lesion is 55% stenosed. The lesion was previously treated over 2 years ago. Previously placed stent displays restenosis.  Mid Cx lesion is 90% stenosed with 90% stenosed side branch in 2nd Mrg.  First Obtuse Marginal Branch  Vessel is moderate in size.  1st Mrg lesion is 90% stenosed.  Second Obtuse Marginal Branch  Vessel is moderate in size. There is moderate disease in the vessel.  Third Obtuse Marginal Branch  Vessel is small in size. There is moderate disease in the vessel.  Right Coronary Artery  Vessel is moderate in size.  Prox RCA lesion is 40% stenosed. The lesion was previously treated over 2 years ago. Previously placed stent displays restenosis.  Previously placed Mid RCA stent of unknown type is widely patent. Previously placed stent displays no rethrombosis.  Dist RCA lesion is 90% stenosed.  Right Posterior Descending Artery  Vessel is small in size.  Right  Posterior Atrioventricular Artery  Vessel is moderate in size.  First Right Posterolateral Branch  Vessel is small in size.  Second Right Posterolateral Branch  Vessel is small in size.  Saphenous Graft To RPDA  SVG graft was visualized by angiography.  Origin to Insertion lesion is 100% stenosed. The lesion is chronically occluded.  LIMA LIMA Graft To Mid LAD  LIMA graft was visualized by angiography and is normal in caliber. The graft exhibits no disease.  Saphenous Graft To 1st Mrg  SVG graft was visualized by angiography and is normal in caliber.  Origin lesion is 65% stenosed (*successfully treated w/ a 3.0 x 12 Onyx  Frontier DES)  Mid Graft lesion is 95% stenosed. Vessel is the culprit lesion. The lesion is thrombotic (*successfully treated w/ a 2.75 x 30 Onyx Frontier DES)  Dist Graft lesion is 30% stenosed.  Saphenous Graft To 2nd Mrg  SVG graft was visualized by angiography.  Origin to Insertion lesion is 100% stenosed. The lesion is chronically occluded.  The left ventricular ejection fraction is 55-65%   Patient Profile     66 y.o. male with a hx of CAD with numerous stents over the years s/p 4-vessel CABG in 12/2014, postoperative atrial fibrillation, DM2, essential hypertension, hyperlipidemia, and sleep apnea, who was admitted 6/13 w/ NSTEMI.  S/p PCI/DES x 2 to the VG  OM1.  Assessment & Plan    1.  NSTEMI/CAD:  H/o CAD s/p multiple stents and CABG x4 in 2016.  Admitted 6/13 w/ chest pain  NSTEMI.  Cath 6/14 w/ multivessel dzs, known occlusions of the VG  RCA and VG  OM2, patent RCA stents (from 2018), and new dzs in the VG  OM1.  Now s/p PCI/DES x 2 to the VG  OM1.  - Doing well postprocedure on aspirin, Effient, isosorbide, statin, ACE inhibitor -Postcatheterization restrictions discussed with him  2.  Essential HTN: Blood pressure is well controlled on today's visit. No changes made to the medications.  3.  HL: Numbers above goal, he is aware of this Does not  want Zetia Difficulty tolerating different statins except for simvastatin We have recommended PCSK9 inhibitor, he would like to read about it first and call our office if he is interested  4.  DMII:   Labile A1c, most recently has been relatively well controlled 6.5   Total encounter time more than 50 minutes  Greater than 50% was spent in counseling and coordination of care with the patient   Signed, Esmond Plants, MD, Ph.D [facility]  For questions or updates, please contact   Please consult [email or web address] for contact info under Cardiology/STEMI.

## 2021-07-14 NOTE — TOC Transition Note (Signed)
Transition of Care [facility]) - CM/SW Discharge Note   Patient Details  Name: Juan [name] MRN: [id number] Date of Birth: [birth year]  Transition of Care [facility]) CM/SW Contact:  Laurena Slimmer, RN Phone Number: 07/14/2021, 11:31 AM   Clinical Narrative:     Transition of Care [facility]) Screening Note   Patient Details  Name: Juan [name] Date of Birth: [birth year]   Transition of Care [facility]) CM/SW Contact:    Laurena Slimmer, RN Phone Number: 07/14/2021, 11:31 AM    Transition of Care Department [facility]) has reviewed patient and no TOC needs have been identified at this time. We will continue to monitor patient advancement through interdisciplinary progression rounds. If new patient transition needs arise, please place a TOC consult.          Patient Goals and CMS Choice        Discharge Placement                       Discharge Plan and Services                                     Social Determinants of Health (SDOH) Interventions     Readmission Risk Interventions     No data to display

## 2021-07-14 NOTE — Discharge Summary (Signed)
Physician Discharge Summary   Patient: Juan [name] MRN: [id number] DOB: [birth year]  Admit date:     07/12/2021  Discharge date: 07/14/21  Discharge Physician: Ezekiel Slocumb   PCP: [place] Pink, MD   Recommendations at discharge:    Follow up with Cardiology Follow up with Primary Care Repeat BMP, Mg, CBC in 1-2 weeks  Discharge Diagnoses: Principal Problem:   NSTEMI (non-ST elevated myocardial infarction) ([place]) Active Problems:   Essential hypertension   CAD s/p CABG and multiple stents   Post-surgical hypothyroidism   Type II diabetes mellitus (HCC)   Paroxysmal atrial fibrillation (HCC)   Thrombocytopenia ([place])  Resolved Problems:   * No resolved hospital problems. *  Hospital Course: Per H&P:  "Juan [name] is a 66 y.o. male with medical history significant for Extensive CAD with multiple stents prior to CABG in 2016 and another stent in 2018, HTN, DM who presents with exertional chest pain and shortness of breath persistent after 2 sublingual nitro's.  By arrival in the ED pain had resolved.  He was last seen by his cardiologist, Dr. Rockey Situ a couple weeks prior. ED course and data review: BP 152/73, temp 99.5 with otherwise normal vitals. Troponin 21-58.  Labs otherwise unremarkable except for low platelets of 119,000. EKG, personally reviewed and interpreted showed diffuse ST depression.Chest x-ray showed no active disease. Patient was started on a heparin infusion given chewable aspirin and hospitalist consulted for admission. "  Cardiology took patient to the cath lab, 2 stents were placed to stenosed graft lesions (see cath report for details).  Patient remains chest pain free today.  Clinically improved, medically stable and cleared for discharge by cardiology, with outpatient follow up.   Assessment and Plan: * NSTEMI (non-ST elevated myocardial infarction) ([place]) CAD s/p CABG and at least 8 stents prior to CABG in 2016, last stent in  2018 Heparin infusion Continue aspirin, isosorbide, metoprolol, ramipril and simvastatin Nitroglycerin sublingual as needed chest pain with morphine for breakthrough Cardiology consult.  Patient follows with Dr. Rockey Situ We will keep n.p.o. tonight  Thrombocytopenia ([place]) Plt 119k on admission. Monitor closely on heparin gtt.  Paroxysmal atrial fibrillation (HCC) Continue metoprolol.  Not currently on systemic anticoagulation  Type II diabetes mellitus (HCC) Sliding scale insulin coverage  Post-surgical hypothyroidism Continue Synthroid  Essential hypertension BP controlled Continue metoprolol and ramipril         Consultants: Cardiology Procedures performed: Left heart cath  Disposition: Home Diet recommendation:  Cardiac diet DISCHARGE MEDICATION: Allergies as of 07/14/2021   No Known Allergies      Medication List     STOP taking these medications    clopidogrel 75 MG tablet Commonly known as: PLAVIX       TAKE these medications    aspirin EC 81 MG tablet Take 81 mg by mouth daily.   cholecalciferol 25 MCG (1000 UNIT) tablet Commonly known as: VITAMIN D3 Take 1,000 Units by mouth daily.   Continuous Blood Gluc Sensor Misc 1 each by Does not apply route as directed. Use as directed every 10 days. May dispense FreeStyle [employer] or similar.   CoQ10 100 MG Caps Take 100 mg by mouth daily.   empagliflozin 25 MG Tabs tablet Commonly known as: JARDIANCE Take 25 mg by mouth daily.   FISH OIL PO Take 900 mg by mouth daily.   ibuprofen 200 MG tablet Commonly known as: ADVIL Take 200 mg by mouth every 6 (six) hours as needed for mild pain.  isosorbide mononitrate 30 MG 24 hr tablet Commonly known as: IMDUR Take 1 tablet (30 mg total) by mouth daily.   levothyroxine 100 MCG tablet Commonly known as: Synthroid Take 1 tablet (100 mcg total) by mouth daily before breakfast.   metFORMIN 500 MG 24 hr tablet Commonly known as:  GLUCOPHAGE-XR Take 1,000 mg by mouth 2 (two) times daily.   metoprolol tartrate 100 MG tablet Commonly known as: LOPRESSOR Take 1 tablet (100 mg total) by mouth 2 (two) times daily.   multivitamin with minerals Tabs tablet Take 1 tablet by mouth 2 (two) times daily.   nitroGLYCERIN 0.4 MG SL tablet Commonly known as: NITROSTAT Place 1 tablet (0.4 mg total) under the tongue every 5 (five) minutes as needed for chest pain.   prasugrel 10 MG Tabs tablet Commonly known as: EFFIENT Take 1 tablet (10 mg total) by mouth daily. Start taking on: July 15, 2021   ramipril 10 MG capsule Commonly known as: ALTACE Take 1 capsule (10 mg total) by mouth daily.   simvastatin 40 MG tablet Commonly known as: ZOCOR Take 1 tablet (40 mg total) by mouth every evening.   VITAMIN C PO Take 2 tablets by mouth daily.        Discharge Exam: Filed Weights   07/12/21 2229  Weight: 82.1 kg   General exam: awake, alert, no acute distress HEENT: atraumatic, clear conjunctiva, anicteric sclera, moist mucus membranes, hearing grossly normal  Respiratory system: CTAB, no wheezes, rales or rhonchi, normal respiratory effort. Cardiovascular system: normal S1/S2,  RRR, no JVD, murmurs, rubs, gallops,  no pedal edema.   Gastrointestinal system: soft, NT, ND, no HSM felt, +bowel sounds. Central nervous system: A&O x4. no gross focal neurologic deficits, normal speech Extremities: moves all , no edema, normal tone Skin: dry, intact, normal temperature, normal color, No rashes, lesions or ulcers Psychiatry: normal mood, congruent affect, judgement and insight appear normal   Condition at discharge: stable  The results of significant diagnostics from this hospitalization (including imaging, microbiology, ancillary and laboratory) are listed below for reference.   Imaging Studies: CARDIAC CATHETERIZATION  Addendum Date: 07/13/2021   Conclusions: Severe native three-vessel coronary artery disease, as  detailed below. Widely patent LIMA-LAD. Patent SVG-OM1 with sequential 60-70% ostial, 95% mid, and 30% distal stenoses.  Culprit for the patient's NSTEMI is likely the thrombotic 95% mid graft lesion. Chronically occluded SVG-OM2 and SVG-PDA. Normal left ventricular systolic function (LVEF 41-32%). Mildly elevated left ventricular filling pressure (LVEDP 15-28 mmHg). Successful PCI to sequential ostial and mid SVG-OM1 stenoses using nonoverlapping Onyx Frontier 3.0 x 12 mm (ostial) and 2.75 x 30 mm (mid) drug-eluting stent with 0% residual stenosis and TIMI-3 flow. Recommendations: Dual antiplatelet therapy with aspirin and prasugrel for at least 12 months.  Patient switched from clopidogrel for more potent antiplatelet therapy in the setting of thrombotic appearing lesion in OM1. I will review the patient's angiograms with Drs. Gollan and Arida regarding medical therapy versus PCI to the distal RCA disease, which has worsened since 2018.  Favor medical therapy of chronic severe LCx lesions unless the patient has refractory angina given diffuse nature of the disease. Follow-up echocardiogram. Aggressive secondary prevention of coronary artery disease. Nelva Bush, MD [facility]   Result Date: 07/13/2021 Conclusions: Severe native three-vessel coronary artery disease, as detailed below. Widely patent LIMA-LAD. Patent SVG-OM1 with sequential 60-70% ostial, 95% mid, and 30% distal stenoses.  Culprit for the patient's NSTEMI is likely the thrombotic 95% mid graft lesion. Chronically occluded SVG-OM2  and SVG-PDA. Normal left ventricular systolic function (LVEF 02-40%). Mildly elevated left ventricular filling pressure (LVEDP 15-28 mmHg). Successful PCI to sequential ostial and mid SVG-OM1 stenoses using nonoverlapping Onyx Frontier 3.0 x 12 mm (ostial) and 2.75 x 30 mm (mid) drug-eluting stent with 0% residual stenosis and TIMI-3 flow. Recommendations: Dual antiplatelet therapy with aspirin and prasugrel for at  least 12 months.  Patient switched from clopidogrel for more potent antiplatelet therapy in the setting of thrombotic appearing lesion in OM1. I will review the patient's angiograms with Drs. Gollan and Arida regarding medical therapy versus PCI to the distal RCA disease, which has worsened since 2020.  Favor medical therapy of chronic severe LCx lesions unless the patient has refractory angina given diffuse nature of the disease. Follow-up echocardiogram. Aggressive secondary prevention of coronary artery disease. Nelva Bush, MD [facility]  ECHOCARDIOGRAM COMPLETE  Result Date: 07/13/2021    ECHOCARDIOGRAM REPORT   Patient Name:   Juan [name] Date of Exam: 07/13/2021 Medical Rec #:  [id number]            Height:       68.0 in Accession #:    [id number]           Weight:       181.0 lb Date of Birth:  Dec 06, 1955            BSA:          1.959 m Patient Age:    34 years             BP:           142/73 mmHg Patient Gender: M                    HR:           62 bpm. Exam Location:  [facility] Procedure: 2D Echo, Cardiac Doppler and Color Doppler Indications:     NSTEMI I21.4  History:         Patient has prior history of Echocardiogram examinations, most                  recent 12/06/2017. CAD and Previous Myocardial Infarction; Risk                  Factors:Hypertension, Dyslipidemia and Diabetes.  Sonographer:     Sherrie Sport Referring Phys:  [id number] SHERI HAMMOCK Diagnosing Phys: Kate Sable MD  Sonographer Comments: Suboptimal parasternal window. IMPRESSIONS  1. Left ventricular ejection fraction, by estimation, is 50 to 55%. The left ventricle has low normal function. The left ventricle has no regional wall motion abnormalities. There is mild left ventricular hypertrophy. Left ventricular diastolic parameters were normal.  2. Right ventricular systolic function was not well visualized. The right ventricular size is normal.  3. The mitral valve is normal in structure. No evidence of mitral valve  regurgitation.  4. The aortic valve was not well visualized. Aortic valve regurgitation is trivial. Aortic valve sclerosis is present, with no evidence of aortic valve stenosis. FINDINGS  Left Ventricle: Left ventricular ejection fraction, by estimation, is 50 to 55%. The left ventricle has low normal function. The left ventricle has no regional wall motion abnormalities. The left ventricular internal cavity size was normal in size. There is mild left ventricular hypertrophy. Left ventricular diastolic parameters were normal. Right Ventricle: The right ventricular size is normal. No increase in right ventricular wall thickness. Right ventricular systolic function was not well visualized. Left Atrium: Left  atrial size was normal in size. Right Atrium: Right atrial size was normal in size. Pericardium: There is no evidence of pericardial effusion. Mitral Valve: The mitral valve is normal in structure. No evidence of mitral valve regurgitation. MV peak gradient, 5.9 mmHg. The mean mitral valve gradient is 2.0 mmHg. Tricuspid Valve: The tricuspid valve is not well visualized. Tricuspid valve regurgitation is not demonstrated. Aortic Valve: The aortic valve was not well visualized. Aortic valve regurgitation is trivial. Aortic valve sclerosis is present, with no evidence of aortic valve stenosis. Aortic valve mean gradient measures 2.5 mmHg. Aortic valve peak gradient measures  4.7 mmHg. Aortic valve area, by VTI measures 2.88 cm. Pulmonic Valve: The pulmonic valve was not well visualized. Pulmonic valve regurgitation is not visualized. Aorta: The aortic root is normal in size and structure. Venous: The inferior vena cava was not well visualized. IAS/Shunts: No atrial level shunt detected by color flow Doppler.  LEFT VENTRICLE PLAX 2D LVIDd:         4.10 cm      Diastology LVIDs:         3.65 cm      LV e' medial:    5.55 cm/s LV PW:         1.10 cm      LV E/e' medial:  14.6 LV IVS:        1.15 cm      LV e' lateral:    10.40 cm/s LVOT diam:     2.00 cm      LV E/e' lateral: 7.8 LV SV:         59 LV SV Index:   30 LVOT Area:     3.14 cm  LV Volumes (MOD) LV vol d, MOD A2C: 70.8 ml LV vol d, MOD A4C: 125.0 ml LV vol s, MOD A2C: 26.5 ml LV vol s, MOD A4C: 55.9 ml LV SV MOD A2C:     44.3 ml LV SV MOD A4C:     125.0 ml LV SV MOD BP:      59.4 ml RIGHT VENTRICLE RV Basal diam:  3.50 cm RV S prime:     9.90 cm/s TAPSE (M-mode): 1.3 cm LEFT ATRIUM             Index        RIGHT ATRIUM           Index LA diam:        3.70 cm 1.89 cm/m   RA Area:     14.70 cm LA Vol (A2C):   34.5 ml 17.61 ml/m  RA Volume:   37.00 ml  18.89 ml/m LA Vol (A4C):   45.1 ml 23.02 ml/m LA Biplane Vol: 40.0 ml 20.42 ml/m  AORTIC VALVE                    PULMONIC VALVE AV Area (Vmax):    2.35 cm     PV Vmax:        0.72 m/s AV Area (Vmean):   2.42 cm     PV Vmean:       55.400 cm/s AV Area (VTI):     2.88 cm     PV VTI:         0.164 m AV Vmax:           108.90 cm/s  PV Peak grad:   2.1 mmHg AV Vmean:          71.400 cm/s  PV  Mean grad:   1.0 mmHg AV VTI:            0.206 m      RVOT Peak grad: 4 mmHg AV Peak Grad:      4.7 mmHg AV Mean Grad:      2.5 mmHg LVOT Vmax:         81.60 cm/s LVOT Vmean:        55.100 cm/s LVOT VTI:          0.189 m LVOT/AV VTI ratio: 0.92  AORTA Ao Root diam: 3.50 cm MITRAL VALVE               TRICUSPID VALVE MV Area (PHT): 2.78 cm    TR Peak grad:   13.1 mmHg MV Area VTI:   1.86 cm    TR Vmax:        181.00 cm/s MV Peak grad:  5.9 mmHg MV Mean grad:  2.0 mmHg    SHUNTS MV Vmax:       1.21 m/s    Systemic VTI:  0.19 m MV Vmean:      74.5 cm/s   Systemic Diam: 2.00 cm MV Decel Time: 273 msec    Pulmonic VTI:  0.175 m MV E velocity: 81.00 cm/s MV A velocity: 75.00 cm/s MV E/A ratio:  1.08 Kate Sable MD Electronically signed by Kate Sable MD Signature Date/Time: 07/13/2021/6:47:02 PM    Final    DG Chest Port 1 View  Result Date: 07/12/2021 CLINICAL DATA:  Chest pain EXAM: PORTABLE CHEST 1 VIEW COMPARISON:   11/23/2016 FINDINGS: Prior CABG. Heart and mediastinal contours are within normal limits. No focal opacities or effusions. No acute bony abnormality. IMPRESSION: No active disease. Electronically Signed   By: Rolm Baptise M.D.   On: 07/12/2021 22:57    Microbiology: Results for orders placed or performed in visit on 01/23/19  Novel Coronavirus, NAA ([facility])     Status: None   Collection Time: 01/23/19  9:18 AM   Specimen: Oropharyngeal(OP) collection in vial transport medium   OROPHARYNGEA  TESTING  Result Value Ref Range Status   SARS-CoV-2, NAA Not Detected Not Detected Final    Comment: This nucleic acid amplification test was developed and its performance characteristics determined by [employer]. Nucleic acid amplification tests include PCR and TMA. This test has not been FDA cleared or approved. This test has been authorized by FDA under an Emergency Use Authorization (EUA). This test is only authorized for the duration of time the declaration that circumstances exist justifying the authorization of the emergency use of in vitro diagnostic tests for detection of SARS-CoV-2 virus and/or diagnosis of COVID-19 infection under section 564(b)(1) of the Act, 21 U.S.C. 109NAT-5(T) (1), unless the authorization is terminated or revoked sooner. When diagnostic testing is negative, the possibility of a false negative result should be considered in the context of a patient's recent exposures and the presence of clinical signs and symptoms consistent with COVID-19. An individual without symptoms of COVID-19 and who is not shedding SARS-CoV-2 virus would  expect to have a negative (not detected) result in this assay.     Labs: CBC: Recent Labs  Lab 07/12/21 2231 07/14/21 0630  WBC 5.2 4.7  HGB 15.9 15.0  HCT 47.4 43.9  MCV 95.8 94.4  PLT 119* 96*   Basic Metabolic Panel: Recent Labs  Lab 07/12/21 2231 07/14/21 0630  NA 140 141  K 4.4 4.3  CL 104 109  CO2 24 24  GLUCOSE 189* 135*  BUN 15 17  CREATININE 0.69 0.70  CALCIUM 9.6 8.8*  MG  --  2.3   Liver Function Tests: No results for input(s): "AST", "ALT", "ALKPHOS", "BILITOT", "PROT", "ALBUMIN" in the last 168 hours. CBG: Recent Labs  Lab 07/13/21 1425 07/13/21 2027 07/13/21 2351 07/14/21 0347 07/14/21 0717  GLUCAP 126* 170* 130* 138* 148*    Discharge time spent: less than 30 minutes.  Signed: Ezekiel Slocumb, DO [facility] 07/14/2021

## 2021-07-15 ENCOUNTER — Other Ambulatory Visit: Payer: Self-pay

## 2021-07-15 MED ORDER — NITROGLYCERIN 0.4 MG SL SUBL
0.4000 mg | SUBLINGUAL_TABLET | SUBLINGUAL | 1 refills | Status: AC | PRN
Start: 2021-07-15 — End: ?

## 2021-07-18 ENCOUNTER — Telehealth: Payer: Self-pay | Admitting: Cardiovascular Disease

## 2021-07-18 NOTE — Telephone Encounter (Addendum)
Patient had a cath done last week Wednesday.  Wife states that they noticed a bruise at the site with a red line running from this site to half way down his elbow.  They haven't notice it before today.

## 2021-07-19 NOTE — Telephone Encounter (Signed)
Called and spoke with patient.   Patient reports that yesterday or the day before he was driving and noticed that on the side they did the cath he has a bruise on his forearm. States that he does NOT have bruising around the insertion site.   Cannot remember hitting his forearm on anything but can't say that he didn't. States that the bruise runs down the middle of his forearm and parts of it are turning yellow.   Denies swelling or warmth to the arm.   Advised patient that sometimes there can be delayed bruising and that the yellowing was normal, and a sign that the bruise was healing.   Advised patient that should it worsen, or become swollen or warm to call us and let us know.   Pt verbalized understanding and voiced appreciation for the call.

## 2021-07-22 ENCOUNTER — Encounter: Payer: Self-pay | Admitting: Cardiovascular Disease

## 2021-08-10 ENCOUNTER — Encounter: Payer: Medicare Other | Admitting: *Deleted

## 2021-08-10 DIAGNOSIS — Z006 Encounter for examination for normal comparison and control in clinical research program: Secondary | ICD-10-CM

## 2021-08-10 NOTE — Research (Signed)
V-inception Informed Consent   Subject Name: Juan [name]  Subject met inclusion and eV-xclusion criteria.  The informed consent form, study requirements and expectations were reviewed with the subject and questions and concerns were addressed prior to the signing of the consent form.  The subject verbalized understanding of the trial requirements.  The subject agreed to participate in the V-inception trial and signed the informed consent at  1005  on 12/Jul/2023.  The informed consent was obtained prior to performance of any protocol-specific procedures for the subject.  A copy of the signed informed consent was given to the subject and a copy was placed in the subject's medical record.   [name]

## 2021-08-14 NOTE — Progress Notes (Signed)
Patient seen as possible [place] inception patient.  Has had prior CABG and subsequent non STEMI.  He recently had successful PCI to ostial and mid SVG to OM1 SVG by Dr. Saunders Revel.  He has had problems with prior statin treatment, and has discussed possible PCSK9 treatment.  He is on Simva 40 which he can tolerate, but could not take atrova or rosuva successfully.  He also has an elevated LP(a).   Alert oriented NAD Lungs clear Cardiac rhythm regular with 1-2/6 SEM. NO DM.   We will await screening labs for V Inception.   Loretha Brasil. Wagner Tanzi MD [facility]

## 2021-08-15 NOTE — Research (Signed)
Screening Visit 1  Juan [name], Juan [name] D  Did this Visit Occur? [x]   Yes   OR     []  NO  Date of Visit Date _12/_JUL/_2023____         DD/MON/YYYY  Type of Visit [x] Planned or []  Unplanned [x]  Visit 1 Screening []  Visit 2 Baseline []  Visit 3 []  Visit 4 []  Visit 5/EOS   Protocol Version number under which subject entered study Protocol Version __4.0________  Was Study Informed Consent obtained [x]   Yes   OR     []  NO  Date of Study Informed Consent Date _12__/_Jul_/_2023____           DD/MON/YYYY   Subject Re-Screening (collected Visit 1 only) Is the Subject being re-screened? []   Yes   OR     [x]  NO  Site ID (where subject was previously screened) _______  Subject Number (previously screened) ______    V-Inception Inclusion Criteria Screening Yes [x]    No[]  Males and females ? 65 years of age   Yes [x]  No[]  Recent ACS (inpatient/outpatient) within 5 weeks of screening, defined as: Ischemic symptoms with unstable pattern, occurring at rest or minimal exertion within 24 hours of an unscheduled hospital admission or emergency room visit, due to presumed or proven obstructive coronary disease and at least one of the following:  Elevated cardiac biomarkers (Cardiac Troponin (cTn) or the MB fraction of Creatinine Kinase (CKMB)) with at least one value above the 99th percentile of the upper reference limit (URL) or defined by the local laboratory MI diagnosis cut-off values OR Resting ECG changes consistent with ischemia or infarction AND additional evidence of obstructive coronary disease.   Yes [x]  No[]  Serum LDL-C ? 38m/dL or non-HDL ? 100 mg/dL   Yes [x]  No[]  Fasting triglycerides <4.52 mmol/L (<400 at screening)   Yes [x]  No[]  Calculated glomerular filtration rate >20 mL/min by estimated glomerular filtration (eGFR)   Yes [x]  No[]  Participants willing to give consent before initiation of any study related procedures and willing to comply with all required study procedures    Yes [x]  No[]  Participants are required to be on statin therapy, or have documented statin intolerance, as determined by the investigator, following hospitalization (inpatient/outpatient) for an ACS. Statin intolerant patients are eligible if they had intolerable side effects on at least 2 different statins, including one at the lowest standard dose.     Exclusion Criteria Screening Yes []  No[x]  Any uncontrolled or serious disease, or any medical or surgical condition, that may either interfere with participation in the clinical study, and/or put the participant at significant risk (according to investigator's [or delegate] judgment) if he/she participates in the clinical study   Yes []  No[x]  An underlying known disease, or surgical, physical, or medical condition that, in the opinion of the investigator (or delegate) might interfere with interpretation of the clinical study results.   Yes []  No[x]  New York Heart Association (NYHA) class IIIb or IV heart failure or last known left ventricular ejection fraction <25%. Significant cardiac arrhythmia within 3 months prior to randomization that is not controlled by medication or via ablation at the time of screening.   Yes []  No[x]  Uncontrolled severe hypertension: systolic blood pressure >>240mmHg or diastolic blood pressure >>973mmHg prior to randomization (assessed at screening visit) despite antihypertensive therapy   Yes []  No[x]  Recurrent acute coronary syndrome event within 2 weeks prior to randomization   Yes []  No[x]  Coronary angiography and revascularization procedure (PCI or CABG surgery) performed within 2  weeks prior to the randomization visit or planned after randomization   Yes []  No[x]  Severe concomitant non-cardiovascular disease that carries the risk of reducing life expectancy to less than 2 years   Yes []  No[x]  History of malignancy that required surgery (excluding local and wide-local excision), radiation therapy and/or systemic  therapy during the three years prior to randomization.  Yes []  No[x]  Women of child-bearing potential, defined as all women physiologically capable of becoming pregnant, unless they are using basic methods of contraception during dosing of investigational drug   Method of Choice: ______________________________  Total abstinence (when this is in line with the preferred and usual lifestyle of the participant). Periodic abstinence (e.g., calendar, ovulation, symptothermal, post-ovulation methods) and withdrawal are not acceptable methods of contraception  Male sterilization (have had surgical bilateral oophorectomy with or          without hysterectomy), total hysterectomy or tubal ligation at least six weeks before taking investigational drug. In case of oophorectomy alone, only when the reproductive status of the woman has been confirmed by follow up hormone level assessment  Male sterilization (at least 6 months prior to screening). For male participants on the study, the vasectomized male partner should be the sole partner for that subject  Barrier methods of contraception: Condom or Occlusive cap (diaphragm or cervical/vault caps)  Use of oral (estrogen and progesterone), injected or implanted hormonal methods of contraception or other forms of hormonal contraception that have comparable efficacy (failure rate <1%), for example hormone vaginal ring or transdermal hormone contraception or placement of an intrauterine device (IUD) or intrauterine system (IUS)  In case of use of oral contraception women should have been stable on the same pill for a minimum of 3 months before taking investigational drug. Women are considered post-menopausal and not of child bearing potential if they have had 12 months of natural (spontaneous) amenorrhea with an appropriate clinical profile (e.g., age appropriate, history of vasomotor symptoms) or have had surgical bilateral oophorectomy (with or without hysterectomy),  total hysterectomy or tubal ligation at least six weeks ago. In  the case of oophorectomy alone, only when the reproductive status of the woman has been confirmed by follow up hormone level assessment is she considered not of child bearing potential.    Yes []  No[x]  Treatment with other investigational products or devices within 30 days or five half?live of the screening visit, whichever is longer.   Yes []  No[x]  History of hypersensitivity to any of the study treatments or its excipients or to drugs of similar chemical classes.   Yes []  No[x]  Planned use of other investigational products or devices during the course of the study   Yes []  No[x]  Any condition that according to the investigator could interfere with the conduct of the  study, such as but not limited to: a. Participants who are unable to communicate or to cooperate with the  investigator. b. Unable to understand the protocol requirements, instructions and study-related  restrictions, the nature, scope, and possible consequences of the study  (including participants whose cooperation is doubtful due to drug abuse or  alcohol dependency). c. Unlikely to comply with the protocol requirements, instructions, and study-related restrictions (e.g., uncooperative attitude, inability to return for follow-up visits, and improbability of completing the study). d. Have any medical or surgical condition, which in the opinion of the  investigator would put the participant at increased risk from participating in the  study. e. Persons directly involved in the conduct of the study   Yes []   No[x]  Treatment with monoclonal antibodies directed towards PCSK9 within 90 days of screening.   Yes []  No[x]  Active liver disease defined as any known current infectious, neoplastic, or metabolic pathology of the liver or (ii) alanine aminotransferase (ALT) elevation >3x ULN, aspartate aminotransferase (AST) elevation >3x ULN, or total bilirubin elevation  >2x ULN (except patients with Gilbert's syndrome) at screening confirmed by a repeat measurement at least one week apart.     Subject Age _66______ Years  Subject Sex [x] Male  OR  []  Male  Subject Childbearing Potential []   Yes   OR     []  NO  Ethnicity []  Hispanic or Latino  [x]  Not Hispanic or Latino []  Not Reported []  Unknown  Race [x] White [] Black or African American [] Asian []  American [place] or [place] Native []  Native Hawaiian or Other [place] []  Unknown  Source or Subject Referral [] Advocacy Group [] Clinical Trial Registry []  ER Visit or Hospital []  Non-[employer] Internet Sites []  Newsletter/Educational []  Material [] [employer] Internet Site []  [facility] Own Practice [] Print Advertisement [x]  Physician Referral []  Radio Advertisement []  Television Advertisement []  Other []  Unknown   Acute Coronary Syndrome (ACS) Medical History  Date of Discharge Hospitalization for Index ACS Event _15_/_Jun___/_2023___ DD/MMM/YYYY  Does Subject have a history of elevated cardiac biomarkers (cardiac troponin cTn) or MB fraction of creatinine kinase (CKMB) with at least one value above the 99th percentile of the upper reference limit (URL) or defined by the local laboratory MI diagnosis cut off values   [x]   Yes   OR     []  NO  Does the Subject have history of resting ECG changes consistent with ischemia or infarction AND additional evidence of obstructive coronary disease   [x]   Yes   OR     []  NO  Type of Imagining performed []  MRI [] CT Scan [x]  Cardiac Cath []  Other_____________ ____________________   Index ACS Event Classification Note: Any responses that are Yes in this section, do not need to be entered in the medical History other CRF  Did the Subject have a MI [x]   Yes   OR     []  NO []  STEMI   [x]  NSTEMI []  Unstable Angina PCI during Event?  [x]   Yes   OR     []  NO Systolic pressure during Index Psychologist, sport and exercise Diastolic Pressure during Index MI  _73_______ Atrial Fibrillation during Index MI  [] Yes[x]  NO   Unstable Angina []   Yes   OR     [x]  NO Location_______________________________________________________ ________________________________________________________________  ST segment elevation myocardial infarction (STEMI) []   Yes   OR     [x]  NO Location________________________________________________________ ________________________________________________________________  Non-ST-segment elevation myocardial infarction (NSTEMI) [x]   Yes   OR     []  NO Location___inferior____________________________________________________ ________________________________________________________________   Revascularization Procedure Associated with ACS Event   Did the subject undergo a revascularization procedure during the Index ACS Event [x]   Yes   OR   []  NO   Type of revascularization procedure   PCI with stent placement  ____________________________  Date of PCI with Stent Placement procedure _14_/__JUL___/_2023______ DD/MMM/YYYY  Date of PCI without Stent Placement procedure ____/_____/_______ DD/MMM/YYYY  Date of CABG procedure ____/_____/_______ DD/MMM/YYYY    Previous Medical History   Does the Subject have a history of MI prior to the Index ACS Event [x]   Yes  OR   []  NO   Date of Most recent MI prior to Index ACS Event  _uk_/_uk__/_1996___ DD/MMM/YYYY  Does the subject  have a history of stroke []   Yes   OR   [x]  NO   Does the subject have a history of peripheral vascular disease []   Yes  OR   [x]  NO   Does the subject have a history of hypertension [x]   Yes   OR   []  NO   Does the subject have a history of diabetes [x]   Yes   OR   []  NO    Post ACS Status at Discharge   Left Ventricular Ejection fraction (LVEF) Result___50_________  Blood Pressure _124___/__63___  Renal Function (eGFR) ___60____ml/min/1.73 m2  Lipid-C Lowering Therapy (meds taken at time of index MI  _statins and other _____________  Statin  Generic Name and dosage _simvastatin ____22m________________  Ezetimibe dose _____________________________________  History of other CV medications prior to Index ACS Event [x]  Beta-Blocker [x]  Antiplatelet [x]  ACEi or ARB [] Mineralocorticoid antagonists [] Oral Loop Diuretics []  Anticoagulants [] Unknown [] None   Statin Intolerance   Statin Drug Name rosuvastatin  Any Medical history of Statin Intolerance [x]   Yes   OR   []  NO    Drug Name:___rosuvastatin______________ Indication:_dyslipidemia___________ Dose:_____unknown____________ Route:__ oral______________ Unit:_____mg____________________ Other Unit Specify_____________________ Frequency:_____daily________   Where there any documented symptoms of intolerance related to this statin at this dose []   Yes   OR   [x]  NO If yes:________________________ _______________________________   If other symptoms     Were there any documented abnormal laboratory findings related to this statin at this dose [x]   Yes   OR   []  NO   Please record all abnormal laboratory findings related to this statin at this dose. Check all that Apply [x]  Hepatic panel (particularly ALT)  []  []  []     Statin Drug Name atorvastatin  Any Medical history of Statin Intolerance [x]   Yes   OR   []  NO    Drug Name:___atorvastatin______________ Indication:_dyslipidemia___________ Dose:_____unknown____________ Route:__ oral______________ Unit:_____mg____________________ Other Unit Specify_____________________ Frequency:_____daily________   Where there any documented symptoms of intolerance related to this statin at this dose []   Yes   OR   [x]  NO If yes:________________________ _______________________________   If other symptoms     Were there any documented abnormal laboratory findings related to this statin at this dose [x]   Yes   OR   []  NO   Please record all abnormal laboratory findings related to this statin at this dose. Check all that Apply  [x]  Hepatic panel (particularly ALT)  []  []  []      Was smoking status assessed [x]   Yes   OR   []  NO   Type of Substance [x]  Tobacco - []   Yes   OR   []  NO                     [] Former                     [x]  Current                     [] Never [x]  Chewing Tobacco - []   Yes OR []  NO                     [] Former                     []  Current                     [x] Never [x]  Marijuana - []   Yes  OR   []  NO                     [] Former                     []  Current                     [x] Never [x] Vape - []   Yes   OR   []  NO                     [] Former                     []  Current                      [x] Never [x]  Other___cigar - current - socal ____________________________    Vital Signs   Date of Measurement _12_/_JUL_/_2023__ DD/MMM/YYYY  Height/Hight Unit and Weight/Weight Units __68_____________ inches __181_____________ lbs  Pulse Rate ____60___________ Beats per Minute  Systolic Blood Pressure __229________   Diastolic Blood Pressure _ 56______________  Blood Pressure Position __sitting ______________   Was Central Laboratory Assessment performed [x]   Yes   OR   []  NO Date of Collection: _12_/_JUL_/_ 2023__                            DD/MMM/YYYY Was the Patient Fasting:  [x]   Yes   OR   []  NO Last date and time of solid foods: _11_/_JUL_/_2023__       _22_ : _00_      DD/MMM/YYYY               24 Hour  Accession Number [id number]  "Is the subject of childbearing potential? []   Yes   OR   [x]  NO   Serum Pregnancy Test: Was Central laboratory assessment performed?  (Visit 1 and Unscheduled) []   Yes   OR   [x]  NO Reason Not done:__n//a_______________________  Hematology: Was Central Laboratory assessment performed? (Visit 1, 2, 5, and unscheduled) [x]   Yes   OR   []  NO Reason Not done:_________________________  Hepatitis: Was Central Laboratory assessment performed? (Visit1 and Unscheduled) [x]   Yes   OR   []  NO Reason Not done:_________________________   Chemistry: Was Central Laboratory Assessment performed? (Visit 1, 2, 3, 4, 5 and Unscheduled) [x]   Yes   OR   []  NO Reason Not done:_________________________  Lipoprotein(s): Was Central Laboratory Assessment performed?  (Visit 1, 2, 3, 4, 5 and Unscheduled) [x]   Yes   OR   []  NO Reason Not done:_________________________  Coagulation: Was Central Laboratory Assessment performed? (Visit 1, 2, and unscheduled) [x]   Yes   OR   []  NO Reason Not done:_________________________  Fasting Glucose: Was Central Laboratory Assessment performed?  (Visit 1, 2, 3, 4, 5 and Unscheduled) [x]   Yes   OR   []  NO Reason Not done:_________________________  BioBank Sample: Was Central Laboratory Assessment performed?  (Visit 2, 3, 4, 5 and Unscheduled) [x]   Yes   OR   []  NO Reason Not done:_________________________   Was Electrocardiogram (ECG) performed? (Visit 1 and Unscheduled) [x]   Yes   OR   []  NO Reason Not done:______________________  Date of Assessment _12_/_JUL_/_2023______ DD/MMM/YYYY  Time of Assessment _11:23_____ (24 Hour Format)  Any Clinically Significant abnormalities []   Yes   OR   [x]  NO    Date  of Screening Phase _12_/_JUL_/_2023______ DD/MMM/YYYY  Subject Status [x]  Completed []  Screen Failure []  Adverse Event (subject prevented from getting study drug at Baseline. Only if the adverse event is considered an SAE, ensure it has been entered on the AE CRF and report to [employer]  []  Death (only if occurred during screening phase) []  Lost to Follow-Up []  Physician Decision []  Pregnancy []  Progressive Disease [] Protocol Deviation []  Study Terminated by Sponsor []  Technical Problems []  Subject Decision []  COVID  Specific Decision (physician decision and subject decision)  ________________________________ _________________________________  Provide COVID-19 Relationship _________________________________  Date of Death []  Not Applicable or ____/_____/_______                                      DD/MMM/YYYY  Cause of Death []  Not Applicable or Cause ___________________________ __________________________________________________________________   Were there any Adverse Events Experienced? Yes []    No []    If yes, complete the following Description of event: __________________________________________________________________ __________________________________________________________________    Were any Surgical/Medical Procedures reported since signing of Consent Yes []    No[x]    Surgery/Procedure: _____________________  Indication: ___________________________  Start Date: ____/_____/_______                     DD/MMM/YYYY Ongoing: Yes []    No[]   End Date: ____/_____/_______                    DD/MMM/YYYY   Has the subject been hospitalized since Qualifying Hospitalization Yes []    No[x]    Elective []  Planned []  Unplanned []  Date of Admission: ____/_____/_______                         DD/MMM/YYYY Reason for Admission:__________________ ____________________________________ _____________________________________  Was Subject Discharged: Yes []    No[]  Date of Discharge: ____/_____/_______                                DD/MMM/YYYY Discharge Disposition: _________________    Total number of days in intensive care unit or coronary care unit:________________   Has the subject had any emergency room visits (for less than 24 hours) Yes []    No[x]    Date of Visit: ____/_____/_______                         DD/MMM/YYYY Was Visit for an AE: Yes []    No[]  Reason for Visit:__________________ ____________________________________ _____________________________________ Was a concomitant or additional treatment given due to this visit: Yes []    No[]    Has the subject had any other clinical or healthcare professional consultations or visits Yes []    No[x]    Professional Type: ____________________  Date of Visit: ____/_____/_______                          DD/MMM/YYYY Was visit an Adverse Event: Yes []    No[]  Reason for Visit: _____________________     Was a concomitant or additional treatment given due to this visit: Yes []    No[]      Outpatient Encounter Medications as of 08/10/2021  Medication Sig   Ascorbic Acid (VITAMIN C PO) Take 2 tablets by mouth daily.   aspirin EC 81 MG tablet Take 81 mg by mouth daily.   cholecalciferol (VITAMIN D3) 25  MCG (1000 UNIT) tablet Take 1,000 Units by mouth daily.   Coenzyme Q10 (COQ10) 100 MG CAPS Take 100 mg by mouth daily.   Continuous Blood Gluc Sensor MISC 1 each by Does not apply route as directed. Use as directed every 10 days. May dispense FreeStyle [employer] or similar.   empagliflozin (JARDIANCE) 25 MG TABS tablet Take 25 mg by mouth daily.   ibuprofen (ADVIL) 200 MG tablet Take 200 mg by mouth every 6 (six) hours as needed for mild pain.   isosorbide mononitrate (IMDUR) 30 MG 24 hr tablet Take 1 tablet (30 mg total) by mouth daily.   levothyroxine (SYNTHROID) 100 MCG tablet Take 1 tablet (100 mcg total) by mouth daily before breakfast.   metFORMIN (GLUCOPHAGE-XR) 500 MG 24 hr tablet Take 1,000 mg by mouth 2 (two) times daily.   metoprolol tartrate (LOPRESSOR) 100 MG tablet Take 1 tablet (100 mg total) by mouth 2 (two) times daily.   Multiple Vitamin (MULTIVITAMIN WITH MINERALS) TABS tablet Take 1 tablet by mouth 2 (two) times daily.   nitroGLYCERIN (NITROSTAT) 0.4 MG SL tablet Place 1 tablet (0.4 mg total) under the tongue every 5 (five) minutes as needed for chest pain.   Omega-3 Fatty Acids (FISH OIL PO) Take 900 mg by mouth daily.    prasugrel (EFFIENT) 10 MG TABS tablet Take 1 tablet (10 mg total) by mouth daily.   ramipril (ALTACE) 10 MG capsule Take 1 capsule (10 mg total) by mouth daily.   simvastatin (ZOCOR) 40 MG tablet Take 1 tablet (40 mg total) by mouth every evening.   No facility-administered encounter medications on file as of 08/10/2021.

## 2021-08-15 NOTE — Research (Signed)
V-Inception Screening Phase   Date of Screening Phase _17_/_JUL_/_2023______ DD/MMM/YYYY  Subject Status '[]'$  Completed '[x]'$  Screen Failure '[]'$  Adverse Event (subject prevented from getting study drug at Baseline. Only if the adverse event is considered an SAE, ensure it has been entered on the AE CRF and report to [employer]  '[]'$  Death (only if occurred during screening phase) '[]'$  Lost to Follow-Up '[]'$  Physician Decision '[]'$  Pregnancy '[]'$  Progressive Disease '[]'$ Protocol Deviation '[]'$  Study Terminated by Sponsor '[]'$  Technical Problems '[]'$  Subject Decision '[]'$  COVID  Specific Decision (physician decision and subject decision)  ____Triglycerides to high for study ______  Provide COVID-19 Relationship _________________________________  Date of Death '[]'$  Not Applicable or ____/_____/_______                                     DD/MMM/YYYY  Cause of Death '[]'$  Not Applicable or Cause ___________________________ __________________________________________________________________

## 2021-08-15 NOTE — Research (Signed)
Screening labs: Please review labs:  Trig are to high for the study.  He will be a screen fail.    Are there any labs that are clinically significant?  Yes '[]'$  OR No'[]'$   Is the patient eligible to continue enrollment in the study after screening visit?  Yes '[]'$   OR No'[]'$ 

## 2021-08-18 ENCOUNTER — Telehealth: Payer: Self-pay | Admitting: *Deleted

## 2021-08-23 NOTE — Telephone Encounter (Signed)
Spoke with patient to let him know that he don't qualify for the v-inception trial, due to his TG was 510.   We have a TG study but have to wait 75M before can go in study.   Will forward labs to Dr Rockey Situ and Dr Kary Kos for review of his lipids.   Thanked him for participating in the research trial.

## 2021-08-26 ENCOUNTER — Other Ambulatory Visit: Payer: Self-pay

## 2021-08-26 MED ORDER — ISOSORBIDE MONONITRATE ER 30 MG PO TB24: 30.0000 mg | ORAL_TABLET | Freq: Every day | ORAL | 1 refills | Status: DC

## 2021-08-29 ENCOUNTER — Telehealth: Payer: Self-pay | Admitting: Cardiovascular Disease

## 2021-08-29 ENCOUNTER — Other Ambulatory Visit: Payer: Self-pay | Admitting: *Deleted

## 2021-08-29 MED ORDER — ISOSORBIDE MONONITRATE ER 30 MG PO TB24: 30.0000 mg | ORAL_TABLET | Freq: Every day | ORAL | 2 refills | Status: DC

## 2021-08-29 MED ORDER — PRASUGREL HCL 10 MG PO TABS: 10.0000 mg | ORAL_TABLET | Freq: Every day | ORAL | 2 refills | Status: DC

## 2021-08-29 NOTE — Telephone Encounter (Signed)
*  STAT* If patient is at the pharmacy, call can be transferred to refill team.   1. Which medications need to be refilled? (please list name of each medication and dose if known) prasugrel (EFFIENT) 10 MG TABS tablet (this one his wants sent Kristopher Oppenheim)  isosorbide mononitrate (IMDUR) 30 MG 24 hr tablet (this one his wants sent to the mail order)   2. Which pharmacy/location (including street and city if local pharmacy) is medication to be sent to? Prasugrel  to [facility] [id number], [address] Isosorbide sent to [facility] ([place], [address]  3. Do they need a 30 day or 90 day supply? 90 day for both.

## 2021-08-29 NOTE — Telephone Encounter (Signed)
Isosorbide sent to mail order pending refill for Effient forward to RN.

## 2021-09-12 ENCOUNTER — Other Ambulatory Visit: Payer: Self-pay | Admitting: Cardiovascular Disease

## 2021-10-18 ENCOUNTER — Encounter: Payer: Self-pay | Admitting: Cardiovascular Disease

## 2021-10-18 ENCOUNTER — Telehealth: Payer: Self-pay | Admitting: *Deleted

## 2021-10-18 NOTE — Telephone Encounter (Signed)
   Patient Name: Juan [name]  DOB: 12-11-1955 MRN: [id number]  Primary Cardiologist: Ida Rogue, MD  Chart reviewed as part of pre-operative protocol coverage.   IF SIMPLE EXTRACTION/CLEANINGS: Simple dental extractions (i.e. 1-2 teeth) are considered low risk procedures per guidelines and generally do not require any specific cardiac clearance. It is also generally accepted that for simple extractions and dental cleanings, there is no need to interrupt blood thinner therapy.   SBE prophylaxis is not required for the patient from a cardiac standpoint.  Patient has 8 cardiac stents total but no prostatic valve in place.  I will route this recommendation to the requesting party via Epic fax function and remove from pre-op pool.  Please call with questions.  Elgie Collard, PA-C 10/18/2021, 4:35 PM

## 2021-10-18 NOTE — Telephone Encounter (Signed)
   Pre-operative Risk Assessment    Patient Name: Juan [name]  DOB: [birth year] MRN: [id number]     Request for Surgical Clearance    Procedure:   ROUTINE DENTAL CLEANING  Date of Surgery:  Clearance 10/18/21                                 Surgeon:  DR. Norton Blizzard, DDS Surgeon's Group or Practice Name:  [facility] Phone number:  [phone] Fax number:  [phone]   Type of Clearance Requested:   - Medical ; ASKING IF PT NEEDS SBE   Type of Anesthesia:  None    Additional requests/questions:    [name]   10/18/2021, 4:13 PM

## 2021-10-18 NOTE — Telephone Encounter (Signed)
Error

## 2021-11-05 ENCOUNTER — Other Ambulatory Visit: Payer: Self-pay | Admitting: Cardiovascular Disease

## 2022-01-06 NOTE — Progress Notes (Unsigned)
Cardiology Office Note  Date:  01/06/2022   ID:  Juan [name], Juan [name] May 30, 1955, MRN [id number]  PCP:  Maryland Pink, MD   No chief complaint on file.   HPI:  Juan [name] is a very pleasant 66 year old male with a history of  coronary artery disease,  multiple cardiac catheterizations with intervention dating back to 1994,  total of 8 stents,  Diabetes, hemoglobin A1c6 hyperlipidemia, difficult time tolerating numerous statins including Zetia (Tolerating simvastatin 40) Paroxysmal atrial fibrillation, post op CABG  presented to  [facility] with angina,  Cardiac catheterization showing three-vessel disease,  referred to [facility] for CABG. 01/29/15 He presents today for follow-up of his coronary artery disease, CABG  LOV June 2023 On follow-up today reports having Some increased SOB when walking, some chest  discomfort Chest pain in bed when turning Getting worse with with time Now taking rare nitro, never had to take nitro before Pain will resolve with sublingual nitro  Continues to do regular exercise, starts his daily walk heading up the hill and has noticed some chest tightness that resolves after walking down the hill  No leg edema, no PND orthopnea, no near-syncope or syncope  Tolerating simvastatin, did not tolerate Zetia Total cholesterol above goal, numbers discussed  EKG personally reviewed by myself on todays visit Normal sinus rhythm rate 62 bpm ST and T wave abnormality anterolateral leads, unchanged from prior EKG   Other past medical history reviewed Tested positive for COVID December 26, 2020 Symptoms started November 25  Zetia side effects diarrhea Has stayed on simvastatin, could not tolerate other statins  Cardiac catheterization November 24, 2016  1.  Significant underlying three-vessel coronary artery disease with patent LIMA to LAD and patent SVG to OM1.  Occluded SVG to OM 2 and SVG to right PDA. 2.  Low normal LV systolic function with an EF of 50%  with normal left ventricular end-diastolic pressure. 3.  Unsuccessful attempted angioplasty of the right coronary artery due to inability to advance the wire into the distal right coronary artery as the result of tortuosity and a bifurcation with a large RV marginal.   Reattempt at intervention of the RCA November 29, 2016 in [place] Successful complex angioplasty and drug-eluting stent placement to the mid right coronary artery. Very difficult procedure due to calcified vessel with difficulty advancing equipment.  The RV branch was jailed by the stent but had TIMI-3 flow.    PMH:   has a past medical history of Childhood asthma, Coronary artery disease, Essential hypertension, GERD (gastroesophageal reflux disease), Heart murmur, History of kidney stones, Hyperlipidemia, Hypothyroidism, Mild sleep apnea, Myocardial infarction ([place]) (1996), and Type II diabetes mellitus ([place]).  PSH:    Past Surgical History:  Procedure Laterality Date   [place]; Dr.Harold; [place]  2002   "total of 8 stents by now" (11/29/2016)   CARDIAC CATHETERIZATION N/A 01/21/2015   Procedure: Left Heart Cath and Coronary Angiography;  Surgeon: Wellington Hampshire, MD;  Location: [place] [facility];  Service: Cardiovascular;  Laterality: N/A;   CARDIAC CATHETERIZATION  11/24/2016   "couldn't get stent in this time" (11/29/2016)   COLONOSCOPY WITH PROPOFOL N/A 04/17/2018   Procedure: COLONOSCOPY WITH PROPOFOL;  Surgeon: [place], Benay Pike, MD;  Location: [facility];  Service: Gastroenterology;  Laterality: N/A;   [place]; Dr. Joneen Boers; [place]   ""  CORONARY ANGIOPLASTY WITH STENT PLACEMENT     ''''   CORONARY ANGIOPLASTY WITH STENT PLACEMENT     '''   CORONARY ANGIOPLASTY WITH STENT PLACEMENT     '''   CORONARY ANGIOPLASTY WITH STENT  PLACEMENT  2006   "2 stents for a total of 10 stents"; [facility]; [place].    CORONARY ANGIOPLASTY WITH STENT PLACEMENT  11/29/2016   "1 stent"   CORONARY ARTERY BYPASS GRAFT N/A 01/29/2015   Procedure: CORONARY ARTERY BYPASS GRAFTING (CABG)x four using left internal mammary artery artery and bilateral saphenous thigh vein using endoscope.;  Surgeon: Ivin Poot, MD;  Location: [facility];  Service: Open Heart Surgery;  Laterality: N/A;   CORONARY STENT INTERVENTION N/A 11/24/2016   Procedure: CORONARY STENT INTERVENTION;  Surgeon: Wellington Hampshire, MD;  Location: [place] [facility];  Service: Cardiovascular;  Laterality: N/A;   CORONARY STENT INTERVENTION N/A 11/29/2016   Procedure: CORONARY STENT INTERVENTION;  Surgeon: Wellington Hampshire, MD;  Location: [place] [facility];  Service: Cardiovascular;  Laterality: N/A;   CORONARY STENT INTERVENTION N/A 07/13/2021   Procedure: CORONARY STENT INTERVENTION;  Surgeon: Nelva Bush, MD;  Location: [place] [facility];  Service: Cardiovascular;  Laterality: N/A;   GANGLION CYST EXCISION Right    LEFT HEART CATH AND CORONARY ANGIOGRAPHY Left 11/24/2016   Procedure: LEFT HEART CATH AND CORONARY ANGIOGRAPHY;  Surgeon: Wellington Hampshire, MD;  Location: [place] [facility];  Service: Cardiovascular;  Laterality: Left;   LEFT HEART CATH AND CORONARY ANGIOGRAPHY N/A 07/13/2021   Procedure: LEFT HEART CATH AND CORONARY ANGIOGRAPHY;  Surgeon: Nelva Bush, MD;  Location: [place] [facility];  Service: Cardiovascular;  Laterality: N/A;   NASAL SEPTUM SURGERY     TEE WITHOUT CARDIOVERSION N/A 01/29/2015   Procedure: TRANSESOPHAGEAL ECHOCARDIOGRAM (TEE);  Surgeon: Ivin Poot, MD;  Location: [facility];  Service: Open Heart Surgery;  Laterality: N/A;   THYROIDECTOMY     Current Outpatient Medications on File Prior to Visit  Medication Sig Dispense Refill   Ascorbic Acid (VITAMIN C PO) Take 2 tablets by mouth daily.     aspirin EC 81 MG tablet  Take 81 mg by mouth daily.     cholecalciferol (VITAMIN D3) 25 MCG (1000 UNIT) tablet Take 1,000 Units by mouth daily.     Coenzyme Q10 (COQ10) 100 MG CAPS Take 100 mg by mouth daily.     Continuous Blood Gluc Sensor MISC 1 each by Does not apply route as directed. Use as directed every 10 days. May dispense FreeStyle [employer] or similar. 3 each 0   empagliflozin (JARDIANCE) 25 MG TABS tablet Take 25 mg by mouth daily.     ibuprofen (ADVIL) 200 MG tablet Take 200 mg by mouth every 6 (six) hours as needed for mild pain.     isosorbide mononitrate (IMDUR) 30 MG 24 hr tablet TAKE 1 TABLET BY MOUTH DAILY 90 tablet 2   levothyroxine (SYNTHROID) 100 MCG tablet Take 1 tablet (100 mcg total) by mouth daily before breakfast. 90 tablet 0   metFORMIN (GLUCOPHAGE-XR) 500 MG 24 hr tablet Take 1,000 mg by mouth 2 (two) times daily.     metoprolol tartrate (LOPRESSOR) 100 MG tablet TAKE 1 TABLET BY MOUTH TWICE  DAILY 180 tablet 1   Multiple Vitamin (MULTIVITAMIN WITH MINERALS) TABS tablet Take 1 tablet by mouth 2 (two) times daily.     nitroGLYCERIN (NITROSTAT) 0.4 MG SL tablet Place 1 tablet (0.4 mg total)  under the tongue every 5 (five) minutes as needed for chest pain. 25 tablet 1   Omega-3 Fatty Acids (FISH OIL PO) Take 900 mg by mouth daily.      prasugrel (EFFIENT) 10 MG TABS tablet Take 1 tablet (10 mg total) by mouth daily. 90 tablet 2   ramipril (ALTACE) 10 MG capsule TAKE 1 CAPSULE BY MOUTH DAILY 90 capsule 1   simvastatin (ZOCOR) 40 MG tablet Take 1 tablet (40 mg total) by mouth every evening. 90 tablet 3   No current facility-administered medications on file prior to visit.      Allergies:   Patient has no known allergies.   Social History:  The patient  reports that he has been smoking cigars. He has never used smokeless tobacco. He reports current alcohol use. He reports that he does not use drugs.   Family History:   family history includes Cancer (age of onset: 4) in his  father; Cirrhosis in his sister; Drug abuse in his sister; Emphysema in his brother; Heart attack in his father and mother.    Review of Systems: Review of Systems  Constitutional: Negative.   HENT: Negative.    Respiratory:  Positive for shortness of breath.   Cardiovascular:  Positive for chest pain.  Gastrointestinal: Negative.   Musculoskeletal: Negative.   Neurological: Negative.   Psychiatric/Behavioral: Negative.    All other systems reviewed and are negative.   PHYSICAL EXAM: VS:  There were no vitals taken for this visit. , BMI There is no height or weight on file to calculate BMI. Constitutional:  oriented to person, place, and time. No distress.  HENT:  Head: Grossly normal Eyes:  no discharge. No scleral icterus.  Neck: No JVD, no carotid bruits  Cardiovascular: Regular rate and rhythm, no murmurs appreciated Pulmonary/Chest: Clear to auscultation bilaterally, no wheezes or rails Abdominal: Soft.  no distension.  no tenderness.  Musculoskeletal: Normal range of motion Neurological:  normal muscle tone. Coordination normal. No atrophy Skin: Skin warm and dry Psychiatric: normal affect, pleasant   Recent Labs: 07/14/2021: BUN 17; Creatinine, Ser 0.70; Hemoglobin 15.0; Magnesium 2.3; Platelets 96; Potassium 4.3; Sodium 141    Lipid Panel Lab Results  Component Value Date   CHOL 197 02/16/2014   HDL 34.10 (L) 02/16/2014   LDLCALC 61 04/11/2013   TRIG 402.0 (H) 02/16/2014    Wt Readings from Last 3 Encounters:  07/12/21 181 lb (82.1 kg)  07/06/21 190 lb 2 oz (86.2 kg)  01/05/21 183 lb (83 kg)     ASSESSMENT AND PLAN:  Coronary artery disease of native artery of native heart with stable angina pectoris ([place]) Reports having increasing shortness of breath and some chest discomfort sometimes at rest other times with exertion Myoview offered, prefers to wait at this time We will start isosorbide 30 mg daily Recommend he call if symptoms get worse, stress  testing could be ordered  Mixed hyperlipidemia Numbers above goal, Continue simvastatin, did not tolerate other statins Did not tolerate Zetia We have offered PCSK9 inhibitor, this was discussed in detail Also discussed bempedoic acid He will call if he would like to start 1 of these  Essential hypertension Blood pressure is well controlled on today's visit.  Isosorbide 30 mg daily started as above  Paroxysmal atrial fibrillation (HCC)  postoperatively he had brief period of atrial tachycardia/atrial fibrillation, started on amiodarone,  Maintaining normal sinus rhythm  S/P coronary artery stent placement PTCA with stent to the RCA, on aspirin Denies angina  with exercise  Uncontrolled type 2 diabetes mellitus with complication, with long-term current use of insulin ([place]) Stressed importance of keeping the weight low, A1c in the low 6 range A1c running 6.8 Continued exercise recommended  S/P CABG (coronary artery bypass graft) Stable anginal symptoms as above   Total encounter time more than 30 minutes  Greater than 50% was spent in counseling and coordination of care with the patient   No orders of the defined types were placed in this encounter.    Signed, Esmond Plants, M.D., Ph.D. 01/06/2022  [place], [place]

## 2022-01-09 ENCOUNTER — Encounter: Payer: Self-pay | Admitting: Cardiovascular Disease

## 2022-01-09 ENCOUNTER — Ambulatory Visit: Payer: Medicare Other | Attending: Cardiovascular Disease | Admitting: Cardiovascular Disease

## 2022-01-09 VITALS — BP 120/64 | HR 64 | Ht 68.0 in | Wt 185.0 lb

## 2022-01-09 DIAGNOSIS — Z951 Presence of aortocoronary bypass graft: Secondary | ICD-10-CM

## 2022-01-09 DIAGNOSIS — I480 Paroxysmal atrial fibrillation: Secondary | ICD-10-CM

## 2022-01-09 DIAGNOSIS — I10 Essential (primary) hypertension: Secondary | ICD-10-CM

## 2022-01-09 DIAGNOSIS — I25118 Atherosclerotic heart disease of native coronary artery with other forms of angina pectoris: Secondary | ICD-10-CM | POA: Diagnosis not present

## 2022-01-09 DIAGNOSIS — E782 Mixed hyperlipidemia: Secondary | ICD-10-CM | POA: Diagnosis not present

## 2022-01-09 DIAGNOSIS — T466X5D Adverse effect of antihyperlipidemic and antiarteriosclerotic drugs, subsequent encounter: Secondary | ICD-10-CM

## 2022-01-09 DIAGNOSIS — M791 Myalgia, unspecified site: Secondary | ICD-10-CM

## 2022-01-09 MED ORDER — CLOPIDOGREL BISULFATE 75 MG PO TABS
75.0000 mg | ORAL_TABLET | Freq: Every day | ORAL | 3 refills | Status: DC
Start: 2022-01-09 — End: 2023-02-01

## 2022-01-09 NOTE — Patient Instructions (Addendum)
Medication Instructions:  - Your physician has recommended you make the following change in your medication:   1) STOP effient  2) START plavix 75 mg: - take 1 tablet by mouth once daily  Research price of repatha/praluent  If you need a refill on your cardiac medications before your next appointment, please call your pharmacy.   Lab work: No new labs needed  Testing/Procedures: No new testing needed  Follow-Up: At [facility], you and your health needs are our priority.  As part of our continuing mission to provide you with exceptional heart care, we have created designated Provider Care Teams.  These Care Teams include your primary Cardiologist (physician) and Advanced Practice Providers (APPs -  Physician Assistants and Nurse Practitioners) who all work together to provide you with the care you need, when you need it.  You will need a follow up appointment in 6 months  Providers on your designated Care Team:   Murray Hodgkins, NP Christell Faith, PA-C Cadence Kathlen Mody, [place]  COVID-19 Vaccine Information can be found at: [email or web address] For questions related to vaccine distribution or appointments, please email [email or web address] or call [phone].

## 2022-02-13 ENCOUNTER — Other Ambulatory Visit: Payer: Self-pay | Admitting: Cardiovascular Disease

## 2022-02-23 ENCOUNTER — Other Ambulatory Visit: Payer: Self-pay | Admitting: Cardiovascular Disease

## 2022-05-04 ENCOUNTER — Other Ambulatory Visit: Payer: Self-pay | Admitting: Cardiovascular Disease

## 2022-06-03 ENCOUNTER — Other Ambulatory Visit: Payer: Self-pay | Admitting: Cardiovascular Disease

## 2022-06-05 NOTE — Telephone Encounter (Signed)
last visit 01/09/2022 --You will need a follow up appointment in 6 months  next visit-07/11/22

## 2022-06-10 ENCOUNTER — Other Ambulatory Visit: Payer: Self-pay | Admitting: Cardiovascular Disease

## 2022-07-03 ENCOUNTER — Other Ambulatory Visit: Payer: Self-pay | Admitting: Cardiovascular Disease

## 2022-07-10 NOTE — H&P (View-Only) (Signed)
Cardiology Office Note  Date:  07/11/2022   ID:  Juan [name], Juan [name] 1955-09-01, MRN [id number]  PCP:  Jerl Mina, MD   Chief Complaint  Patient presents with   6 month follow up     Patient c/o shortness of breath with exertion. Medications reviewed by the patient verbally.     HPI:  Juan [name] is a  67 year old male with a history of  coronary artery disease,  multiple cardiac catheterizations with intervention dating back to 1994,  total of 8 stents,  Diabetes, hemoglobin A1c 6.6 hyperlipidemia, difficult time tolerating numerous statins including Zetia (Tolerating simvastatin 40)  presented to  [facility] with angina,  Cardiac catheterization showing three-vessel disease,  referred to [facility] for CABG. 01/29/15 Paroxysmal atrial fibrillation, post op CABG He presents today for follow-up of his coronary artery disease, CABG  LOV November 2023 Presents today with his wife Having chest pain on exertion Can't walk for exercise as he used to without developing shortness of breath and chest tightness Sx with climbing stairs, more noticeable Very concerned about blockage as he was in December 2023 when he started having symptoms  Was at [employer] recently on holiday, walking, had trouble getting back to hotel, had to stop to recover several time rooms, got very concerned  Denies PND orthopnea, no leg swelling  Discussed cardiac catheterization images from June 2023 Has residual disease distal RCA, left circumflex Initial recommendation was made for medical management unless he has refractory anginal symptoms  Cardiac catheterization July 13, 2021 PCI to the sequential ostial and mid SVG-OM1 stenoses using nonoverlapping Onyx Frontier 3.0 x 12 mm (ostial) and 2.75 x 30 mm (mid) drug-eluting stent with 0% residual stenosis  -There is also distal RCA disease treated medically  Echo July 13, 2021 EF 50 to 55%  Lab work reviewed Total cholesterol 164 LDL 59 A1c 6.6  EKG  personally reviewed by myself on todays visit Normal sinus rhythm rate 60 bpm ST and T wave abnormality anterolateral leads,   Other past medical history reviewed Tested positive for COVID December 26, 2020 Symptoms started November 25  Zetia side effects diarrhea Has stayed on simvastatin, could not tolerate other statins  Cardiac catheterization November 24, 2016  1.  Significant underlying three-vessel coronary artery disease with patent LIMA to LAD and patent SVG to OM1.  Occluded SVG to OM 2 and SVG to right PDA. 2.  Low normal LV systolic function with an EF of 50% with normal left ventricular end-diastolic pressure. 3.  Unsuccessful attempted angioplasty of the right coronary artery due to inability to advance the wire into the distal right coronary artery as the result of tortuosity and a bifurcation with a large RV marginal.   Reattempt at intervention of the RCA November 29, 2016 in [place] Successful complex angioplasty and drug-eluting stent placement to the mid right coronary artery. Very difficult procedure due to calcified vessel with difficulty advancing equipment.  The RV branch was jailed by the stent but had TIMI-3 flow.    PMH:   has a past medical history of Childhood asthma, Coronary artery disease, Essential hypertension, GERD (gastroesophageal reflux disease), Heart murmur, History of kidney stones, Hyperlipidemia, Hypothyroidism, Mild sleep apnea, Myocardial infarction (HCC) (1996), and Type II diabetes mellitus (HCC).  PSH:    Past Surgical History:  Procedure Laterality Date   CARDIAC CATHETERIZATION  39   [place]; Dr.Harold; [facility].   CARDIAC CATHETERIZATION  2002   "total of 8 stents by now" (11/29/2016)  CARDIAC CATHETERIZATION N/A 01/21/2015   Procedure: Left Heart Cath and Coronary Angiography;  Surgeon: Iran Ouch, MD;  Location: [facility] INVASIVE CV LAB;  Service: Cardiovascular;  Laterality: N/A;   CARDIAC CATHETERIZATION  11/24/2016    "couldn't get stent in this time" (11/29/2016)   COLONOSCOPY WITH PROPOFOL N/A 04/17/2018   Procedure: COLONOSCOPY WITH PROPOFOL;  Surgeon: Toledo, Boykin Nearing, MD;  Location: [facility] ENDOSCOPY;  Service: Gastroenterology;  Laterality: N/A;   CORONARY ANGIOPLASTY WITH STENT PLACEMENT  1994   [place]; Dr. Jake Shark; [facility]   CORONARY ANGIOPLASTY WITH STENT PLACEMENT  1996   ""   CORONARY ANGIOPLASTY WITH STENT PLACEMENT     ''''   CORONARY ANGIOPLASTY WITH STENT PLACEMENT     '''   CORONARY ANGIOPLASTY WITH STENT PLACEMENT     '''   CORONARY ANGIOPLASTY WITH STENT PLACEMENT  2006   "2 stents for a total of 10 stents"; [facility]; [place].    CORONARY ANGIOPLASTY WITH STENT PLACEMENT  11/29/2016   "1 stent"   CORONARY ARTERY BYPASS GRAFT N/A 01/29/2015   Procedure: CORONARY ARTERY BYPASS GRAFTING (CABG)x four using left internal mammary artery artery and bilateral saphenous thigh vein using endoscope.;  Surgeon: Kerin Perna, MD;  Location: [facility] OR;  Service: Open Heart Surgery;  Laterality: N/A;   CORONARY STENT INTERVENTION N/A 11/24/2016   Procedure: CORONARY STENT INTERVENTION;  Surgeon: Iran Ouch, MD;  Location: [facility];  Service: Cardiovascular;  Laterality: N/A;   CORONARY STENT INTERVENTION N/A 11/29/2016   Procedure: CORONARY STENT INTERVENTION;  Surgeon: Iran Ouch, MD;  Location: [facility];  Service: Cardiovascular;  Laterality: N/A;   CORONARY STENT INTERVENTION N/A 07/13/2021   Procedure: CORONARY STENT INTERVENTION;  Surgeon: Yvonne Kendall, MD;  Location: [facility];  Service: Cardiovascular;  Laterality: N/A;   GANGLION CYST EXCISION Right    LEFT HEART CATH AND CORONARY ANGIOGRAPHY Left 11/24/2016   Procedure: LEFT HEART CATH AND CORONARY ANGIOGRAPHY;  Surgeon: Iran Ouch, MD;  Location: [facility];  Service: Cardiovascular;  Laterality: Left;   LEFT HEART CATH AND CORONARY ANGIOGRAPHY N/A 07/13/2021    Procedure: LEFT HEART CATH AND CORONARY ANGIOGRAPHY;  Surgeon: Yvonne Kendall, MD;  Location: [facility];  Service: Cardiovascular;  Laterality: N/A;   NASAL SEPTUM SURGERY     TEE WITHOUT CARDIOVERSION N/A 01/29/2015   Procedure: TRANSESOPHAGEAL ECHOCARDIOGRAM (TEE);  Surgeon: Kerin Perna, MD;  Location: [facility] OR;  Service: Open Heart Surgery;  Laterality: N/A;   THYROIDECTOMY     Current Outpatient Medications on File Prior to Visit  Medication Sig Dispense Refill   aspirin EC 81 MG tablet Take 81 mg by mouth daily.     cholecalciferol (VITAMIN D3) 25 MCG (1000 UNIT) tablet Take 1,000 Units by mouth daily.     clopidogrel (PLAVIX) 75 MG tablet Take 1 tablet (75 mg total) by mouth daily. 90 tablet 3   Coenzyme Q10 (COQ10) 100 MG CAPS Take 100 mg by mouth daily.     empagliflozin (JARDIANCE) 25 MG TABS tablet Take 25 mg by mouth daily.     isosorbide mononitrate (IMDUR) 30 MG 24 hr tablet TAKE 1 TABLET BY MOUTH DAILY 90 tablet 0   levothyroxine (SYNTHROID) 100 MCG tablet Take 1 tablet (100 mcg total) by mouth daily before breakfast. 90 tablet 0   metFORMIN (GLUCOPHAGE-XR) 500 MG 24 hr tablet Take 1,000 mg by mouth 2 (two) times daily.  metoprolol tartrate (LOPRESSOR) 100 MG tablet TAKE 1 TABLET BY MOUTH TWICE  DAILY 180 tablet 0   Multiple Vitamin (MULTIVITAMIN WITH MINERALS) TABS tablet Take 1 tablet by mouth 2 (two) times daily.     nitroGLYCERIN (NITROSTAT) 0.4 MG SL tablet Place 1 tablet (0.4 mg total) under the tongue every 5 (five) minutes as needed for chest pain. 25 tablet 1   nystatin cream (MYCOSTATIN) Apply topically 2 (two) times daily.     Omega-3 Fatty Acids (OMEGA-3 FISH OIL PO) Take 900 mg by mouth daily.     ramipril (ALTACE) 10 MG capsule TAKE 1 CAPSULE BY MOUTH DAILY 90 capsule 0   simvastatin (ZOCOR) 40 MG tablet Take 1 tablet (40 mg total) by mouth every evening. 90 tablet 3   Continuous Blood Gluc Sensor MISC 1 each by Does not apply route as directed. Use  as directed every 10 days. May dispense FreeStyle [employer] or similar. (Patient not taking: Reported on 07/11/2022) 3 each 0   No current facility-administered medications on file prior to visit.    Allergies:   Patient has no known allergies.   Social History:  The patient  reports that he has been smoking cigars. He has never used smokeless tobacco. He reports current alcohol use. He reports that he does not use drugs.   Family History:   family history includes Cancer (age of onset: 75) in his father; Cirrhosis in his sister; Drug abuse in his sister; Emphysema in his brother; Heart attack in his father and mother.    Review of Systems: Review of Systems  Constitutional: Negative.   HENT: Negative.    Respiratory:  Positive for shortness of breath.   Cardiovascular:  Positive for chest pain.  Gastrointestinal: Negative.   Musculoskeletal: Negative.   Neurological: Negative.   Psychiatric/Behavioral: Negative.    All other systems reviewed and are negative.   PHYSICAL EXAM: VS:  BP 110/60 (BP Location: Left Arm, Patient Position: Sitting, Cuff Size: Normal)   Pulse 60   Ht 5\' 8"  (1.727 m)   Wt 185 lb 4 oz (84 kg)   SpO2 97%   BMI 28.17 kg/m  , BMI Body mass index is 28.17 kg/m. Constitutional:  oriented to person, place, and time. No distress.  HENT:  Head: Grossly normal Eyes:  no discharge. No scleral icterus.  Neck: No JVD, no carotid bruits  Cardiovascular: Regular rate and rhythm, no murmurs appreciated Pulmonary/Chest: Clear to auscultation bilaterally, no wheezes or rails Abdominal: Soft.  no distension.  no tenderness.  Musculoskeletal: Normal range of motion Neurological:  normal muscle tone. Coordination normal. No atrophy Skin: Skin warm and dry Psychiatric: normal affect, pleasant   Recent Labs: 07/14/2021: BUN 17; Creatinine, Ser 0.70; Hemoglobin 15.0; Magnesium 2.3; Platelets 96; Potassium 4.3; Sodium 141    Lipid Panel Lab Results   Component Value Date   CHOL 197 02/16/2014   HDL 34.10 (L) 02/16/2014   LDLCALC 61 04/11/2013   TRIG 402.0 (H) 02/16/2014    Wt Readings from Last 3 Encounters:  07/11/22 185 lb 4 oz (84 kg)  01/09/22 185 lb (83.9 kg)  07/12/21 181 lb (82.1 kg)     ASSESSMENT AND PLAN:  Coronary artery disease of native artery of native heart with stable angina pectoris (HCC) Long discussion concerning symptoms, again as on his visit in December 2023 he is concerned about coronary blockages given the way he feels with worsening shortness of breath and some chest tightness on exertion Already  on aspirin, Plavix, beta-blocker, isosorbide, statin Discussed first treatment options with him, noninvasive imaging versus cardiac catheterization He would prefer cardiac catheterization in [facility] with attempt to go home the same day procedure could be done early  Mixed hyperlipidemia Currently tolerating simvastatin 40  Previously reluctant to change statins, did not tolerate Zetia Reluctant to add PCSK9 inhibitor secondary to cost  Essential hypertension Blood pressure is well controlled on today's visit. No changes made to the medications.  Paroxysmal atrial fibrillation (HCC)  postoperatively he had brief period of atrial tachycardia/atrial fibrillation, started on amiodarone,  Maintaining normal sinus rhythm  S/P coronary artery stent placement Most recent catheterization June 2023 with stents placed to vein graft to the OM Plan as above  Uncontrolled type 2 diabetes mellitus with complication, with long-term current use of insulin (HCC) Aggressive diabetes control recommended A1c running 6.6 Exercise limited by anginal symptoms  S/P CABG (coronary artery bypass graft) Stable anginal symptoms as above   Total encounter time more than 40 minutes  Greater than 50% was spent in counseling and coordination of care with the patient   No orders of the defined types were placed in this  encounter.    Signed, Dossie Arbour, M.D., Ph.D. 07/11/2022  [facility] [place], [place] [phone]

## 2022-07-10 NOTE — Progress Notes (Signed)
Cardiology Office Note  Date:  07/11/2022   ID:  Juan [name], Juan [name] 1955-09-01, MRN [id number]  PCP:  Jerl Mina, MD   Chief Complaint  Patient presents with   6 month follow up     Patient c/o shortness of breath with exertion. Medications reviewed by the patient verbally.     HPI:  Juan [name] is a  67 year old male with a history of  coronary artery disease,  multiple cardiac catheterizations with intervention dating back to 1994,  total of 8 stents,  Diabetes, hemoglobin A1c 6.6 hyperlipidemia, difficult time tolerating numerous statins including Zetia (Tolerating simvastatin 40)  presented to  [facility] with angina,  Cardiac catheterization showing three-vessel disease,  referred to [facility] for CABG. 01/29/15 Paroxysmal atrial fibrillation, post op CABG He presents today for follow-up of his coronary artery disease, CABG  LOV November 2023 Presents today with his wife Having chest pain on exertion Can't walk for exercise as he used to without developing shortness of breath and chest tightness Sx with climbing stairs, more noticeable Very concerned about blockage as he was in December 2023 when he started having symptoms  Was at [employer] recently on holiday, walking, had trouble getting back to hotel, had to stop to recover several time rooms, got very concerned  Denies PND orthopnea, no leg swelling  Discussed cardiac catheterization images from June 2023 Has residual disease distal RCA, left circumflex Initial recommendation was made for medical management unless he has refractory anginal symptoms  Cardiac catheterization July 13, 2021 PCI to the sequential ostial and mid SVG-OM1 stenoses using nonoverlapping Onyx Frontier 3.0 x 12 mm (ostial) and 2.75 x 30 mm (mid) drug-eluting stent with 0% residual stenosis  -There is also distal RCA disease treated medically  Echo July 13, 2021 EF 50 to 55%  Lab work reviewed Total cholesterol 164 LDL 59 A1c 6.6  EKG  personally reviewed by myself on todays visit Normal sinus rhythm rate 60 bpm ST and T wave abnormality anterolateral leads,   Other past medical history reviewed Tested positive for COVID December 26, 2020 Symptoms started November 25  Zetia side effects diarrhea Has stayed on simvastatin, could not tolerate other statins  Cardiac catheterization November 24, 2016  1.  Significant underlying three-vessel coronary artery disease with patent LIMA to LAD and patent SVG to OM1.  Occluded SVG to OM 2 and SVG to right PDA. 2.  Low normal LV systolic function with an EF of 50% with normal left ventricular end-diastolic pressure. 3.  Unsuccessful attempted angioplasty of the right coronary artery due to inability to advance the wire into the distal right coronary artery as the result of tortuosity and a bifurcation with a large RV marginal.   Reattempt at intervention of the RCA November 29, 2016 in [place] Successful complex angioplasty and drug-eluting stent placement to the mid right coronary artery. Very difficult procedure due to calcified vessel with difficulty advancing equipment.  The RV branch was jailed by the stent but had TIMI-3 flow.    PMH:   has a past medical history of Childhood asthma, Coronary artery disease, Essential hypertension, GERD (gastroesophageal reflux disease), Heart murmur, History of kidney stones, Hyperlipidemia, Hypothyroidism, Mild sleep apnea, Myocardial infarction (HCC) (1996), and Type II diabetes mellitus (HCC).  PSH:    Past Surgical History:  Procedure Laterality Date   CARDIAC CATHETERIZATION  39   [place]; Dr.Harold; [facility].   CARDIAC CATHETERIZATION  2002   "total of 8 stents by now" (11/29/2016)  CARDIAC CATHETERIZATION N/A 01/21/2015   Procedure: Left Heart Cath and Coronary Angiography;  Surgeon: Iran Ouch, MD;  Location: [facility];  Service: Cardiovascular;  Laterality: N/A;   CARDIAC CATHETERIZATION  11/24/2016    "couldn't get stent in this time" (11/29/2016)   COLONOSCOPY WITH PROPOFOL N/A 04/17/2018   Procedure: COLONOSCOPY WITH PROPOFOL;  Surgeon: Toledo, Boykin Nearing, MD;  Location: [facility] ENDOS[facility];  Service: Gastroenterology;  Laterality: N/A;   CORONARY ANGIOPLASTY WITH STENT PLACEMENT  1994   [place]; Dr. Jake Shark; [facility]   CORONARY ANGIOPLASTY WITH STENT PLACEMENT  1996   ""   CORONARY ANGIOPLASTY WITH STENT PLACEMENT     ''''   CORONARY ANGIOPLASTY WITH STENT PLACEMENT     '''   CORONARY ANGIOPLASTY WITH STENT PLACEMENT     '''   CORONARY ANGIOPLASTY WITH STENT PLACEMENT  2006   "2 stents for a total of 10 stents"; [facility]; [place].    CORONARY ANGIOPLASTY WITH STENT PLACEMENT  11/29/2016   "1 stent"   CORONARY ARTERY BYPASS GRAFT N/A 01/29/2015   Procedure: CORONARY ARTERY BYPASS GRAFTING (CABG)x four using left internal mammary artery artery and bilateral saphenous thigh vein using endoscope.;  Surgeon: Kerin Perna, MD;  Location: [facility] OR;  Service: Open Heart Surgery;  Laterality: N/A;   CORONARY STENT INTERVENTION N/A 11/24/2016   Procedure: CORONARY STENT INTERVENTION;  Surgeon: Iran Ouch, MD;  Location: [facility];  Service: Cardiovascular;  Laterality: N/A;   CORONARY STENT INTERVENTION N/A 11/29/2016   Procedure: CORONARY STENT INTERVENTION;  Surgeon: Iran Ouch, MD;  Location: [facility];  Service: Cardiovascular;  Laterality: N/A;   CORONARY STENT INTERVENTION N/A 07/13/2021   Procedure: CORONARY STENT INTERVENTION;  Surgeon: Yvonne Kendall, MD;  Location: [facility];  Service: Cardiovascular;  Laterality: N/A;   GANGLION CYST EXCISION Right    LEFT HEART CATH AND CORONARY ANGIOGRAPHY Left 11/24/2016   Procedure: LEFT HEART CATH AND CORONARY ANGIOGRAPHY;  Surgeon: Iran Ouch, MD;  Location: [facility];  Service: Cardiovascular;  Laterality: Left;   LEFT HEART CATH AND CORONARY ANGIOGRAPHY N/A 07/13/2021    Procedure: LEFT HEART CATH AND CORONARY ANGIOGRAPHY;  Surgeon: Yvonne Kendall, MD;  Location: [facility];  Service: Cardiovascular;  Laterality: N/A;   NASAL SEPTUM SURGERY     TEE WITHOUT CARDIOVERSION N/A 01/29/2015   Procedure: TRANSESOPHAGEAL ECHOCARDIOGRAM (TEE);  Surgeon: Kerin Perna, MD;  Location: [facility] OR;  Service: Open Heart Surgery;  Laterality: N/A;   THYROIDECTOMY     Current Outpatient Medications on File Prior to Visit  Medication Sig Dispense Refill   aspirin EC 81 MG tablet Take 81 mg by mouth daily.     cholecalciferol (VITAMIN D3) 25 MCG (1000 UNIT) tablet Take 1,000 Units by mouth daily.     clopidogrel (PLAVIX) 75 MG tablet Take 1 tablet (75 mg total) by mouth daily. 90 tablet 3   Coenzyme Q10 (COQ10) 100 MG CAPS Take 100 mg by mouth daily.     empagliflozin (JARDIANCE) 25 MG TABS tablet Take 25 mg by mouth daily.     isosorbide mononitrate (IMDUR) 30 MG 24 hr tablet TAKE 1 TABLET BY MOUTH DAILY 90 tablet 0   levothyroxine (SYNTHROID) 100 MCG tablet Take 1 tablet (100 mcg total) by mouth daily before breakfast. 90 tablet 0   metFORMIN (GLUCOPHAGE-XR) 500 MG 24 hr tablet Take 1,000 mg by mouth 2 (two) times daily.  metoprolol tartrate (LOPRESSOR) 100 MG tablet TAKE 1 TABLET BY MOUTH TWICE  DAILY 180 tablet 0   Multiple Vitamin (MULTIVITAMIN WITH MINERALS) TABS tablet Take 1 tablet by mouth 2 (two) times daily.     nitroGLYCERIN (NITROSTAT) 0.4 MG SL tablet Place 1 tablet (0.4 mg total) under the tongue every 5 (five) minutes as needed for chest pain. 25 tablet 1   nystatin cream (MYCOSTATIN) Apply topically 2 (two) times daily.     Omega-3 Fatty Acids (OMEGA-3 FISH OIL PO) Take 900 mg by mouth daily.     ramipril (ALTACE) 10 MG capsule TAKE 1 CAPSULE BY MOUTH DAILY 90 capsule 0   simvastatin (ZOCOR) 40 MG tablet Take 1 tablet (40 mg total) by mouth every evening. 90 tablet 3   Continuous Blood Gluc Sensor MISC 1 each by Does not apply route as directed. Use  as directed every 10 days. May dispense FreeStyle [employer] or similar. (Patient not taking: Reported on 07/11/2022) 3 each 0   No current facility-administered medications on file prior to visit.    Allergies:   Patient has no known allergies.   Social History:  The patient  reports that he has been smoking cigars. He has never used smokeless tobacco. He reports current alcohol use. He reports that he does not use drugs.   Family History:   family history includes Cancer (age of onset: 75) in his father; Cirrhosis in his sister; Drug abuse in his sister; Emphysema in his brother; Heart attack in his father and mother.    Review of Systems: Review of Systems  Constitutional: Negative.   HENT: Negative.    Respiratory:  Positive for shortness of breath.   Cardiovascular:  Positive for chest pain.  Gastrointestinal: Negative.   Musculoskeletal: Negative.   Neurological: Negative.   Psychiatric/Behavioral: Negative.    All other systems reviewed and are negative.   PHYSICAL EXAM: VS:  BP 110/60 (BP Location: Left Arm, Patient Position: Sitting, Cuff Size: Normal)   Pulse 60   Ht 5\' 8"  (1.727 m)   Wt 185 lb 4 oz (84 kg)   SpO2 97%   BMI 28.17 kg/m  , BMI Body mass index is 28.17 kg/m. Constitutional:  oriented to person, place, and time. No distress.  HENT:  Head: Grossly normal Eyes:  no discharge. No scleral icterus.  Neck: No JVD, no carotid bruits  Cardiovascular: Regular rate and rhythm, no murmurs appreciated Pulmonary/Chest: Clear to auscultation bilaterally, no wheezes or rails Abdominal: Soft.  no distension.  no tenderness.  Musculoskeletal: Normal range of motion Neurological:  normal muscle tone. Coordination normal. No atrophy Skin: Skin warm and dry Psychiatric: normal affect, pleasant   Recent Labs: 07/14/2021: BUN 17; Creatinine, Ser 0.70; Hemoglobin 15.0; Magnesium 2.3; Platelets 96; Potassium 4.3; Sodium 141    Lipid Panel Lab Results   Component Value Date   CHOL 197 02/16/2014   HDL 34.10 (L) 02/16/2014   LDLCALC 61 04/11/2013   TRIG 402.0 (H) 02/16/2014    Wt Readings from Last 3 Encounters:  07/11/22 185 lb 4 oz (84 kg)  01/09/22 185 lb (83.9 kg)  07/12/21 181 lb (82.1 kg)     ASSESSMENT AND PLAN:  Coronary artery disease of native artery of native heart with stable angina pectoris (HCC) Long discussion concerning symptoms, again as on his visit in December 2023 he is concerned about coronary blockages given the way he feels with worsening shortness of breath and some chest tightness on exertion Already  on aspirin, Plavix, beta-blocker, isosorbide, statin Discussed first treatment options with him, noninvasive imaging versus cardiac catheterization He would prefer cardiac catheterization in [facility] with attempt to go home the same day procedure could be done early  Mixed hyperlipidemia Currently tolerating simvastatin 40  Previously reluctant to change statins, did not tolerate Zetia Reluctant to add PCSK9 inhibitor secondary to cost  Essential hypertension Blood pressure is well controlled on today's visit. No changes made to the medications.  Paroxysmal atrial fibrillation (HCC)  postoperatively he had brief period of atrial tachycardia/atrial fibrillation, started on amiodarone,  Maintaining normal sinus rhythm  S/P coronary artery stent placement Most recent catheterization June 2023 with stents placed to vein graft to the OM Plan as above  Uncontrolled type 2 diabetes mellitus with complication, with long-term current use of insulin (HCC) Aggressive diabetes control recommended A1c running 6.6 Exercise limited by anginal symptoms  S/P CABG (coronary artery bypass graft) Stable anginal symptoms as above   Total encounter time more than 40 minutes  Greater than 50% was spent in counseling and coordination of care with the patient   No orders of the defined types were placed in this  encounter.    Signed, Dossie Arbour, M.D., Ph.D. 07/11/2022  [facility] [place], [place] [phone]

## 2022-07-11 ENCOUNTER — Ambulatory Visit: Payer: Medicare Other | Attending: Cardiovascular Disease | Admitting: Cardiovascular Disease

## 2022-07-11 ENCOUNTER — Other Ambulatory Visit: Payer: Self-pay | Admitting: Cardiovascular Disease

## 2022-07-11 ENCOUNTER — Other Ambulatory Visit
Admission: RE | Admit: 2022-07-11 | Discharge: 2022-07-11 | Disposition: A | Discharge status: Home / Self Care | Payer: Medicare Other | Source: Ambulatory Visit | Attending: Cardiovascular Disease | Admitting: Cardiovascular Disease

## 2022-07-11 ENCOUNTER — Encounter: Payer: Self-pay | Admitting: Cardiovascular Disease

## 2022-07-11 VITALS — BP 110/60 | HR 60 | Ht 68.0 in | Wt 185.2 lb

## 2022-07-11 DIAGNOSIS — I480 Paroxysmal atrial fibrillation: Secondary | ICD-10-CM

## 2022-07-11 DIAGNOSIS — E782 Mixed hyperlipidemia: Secondary | ICD-10-CM

## 2022-07-11 DIAGNOSIS — M791 Myalgia, unspecified site: Secondary | ICD-10-CM

## 2022-07-11 DIAGNOSIS — T466X5A Adverse effect of antihyperlipidemic and antiarteriosclerotic drugs, initial encounter: Secondary | ICD-10-CM

## 2022-07-11 DIAGNOSIS — I25118 Atherosclerotic heart disease of native coronary artery with other forms of angina pectoris: Secondary | ICD-10-CM | POA: Diagnosis present

## 2022-07-11 DIAGNOSIS — Z951 Presence of aortocoronary bypass graft: Secondary | ICD-10-CM | POA: Diagnosis not present

## 2022-07-11 DIAGNOSIS — I10 Essential (primary) hypertension: Secondary | ICD-10-CM

## 2022-07-11 DIAGNOSIS — T466X5D Adverse effect of antihyperlipidemic and antiarteriosclerotic drugs, subsequent encounter: Secondary | ICD-10-CM

## 2022-07-11 DIAGNOSIS — I200 Unstable angina: Secondary | ICD-10-CM

## 2022-07-11 LAB — CBC
HCT: 45.5 % (ref 39.0–52.0)
Hemoglobin: 15.9 g/dL (ref 13.0–17.0)
MCH: 32.5 pg (ref 26.0–34.0)
MCHC: 34.9 g/dL (ref 30.0–36.0)
MCV: 93 fL (ref 80.0–100.0)
Platelets: 119 10*3/uL — ABNORMAL LOW (ref 150–400)
RBC: 4.89 MIL/uL (ref 4.22–5.81)
RDW: 13.1 % (ref 11.5–15.5)
WBC: 5.4 10*3/uL (ref 4.0–10.5)
nRBC: 0 % (ref 0.0–0.2)

## 2022-07-11 NOTE — Patient Instructions (Addendum)
  Your Physician recommend you continue on your current medication as directed.      Follow-Up: At [facility], you and your health needs are our priority.  As part of our continuing mission to provide you with exceptional heart care, we have created designated Provider Care Teams.  These Care Teams include your primary Cardiologist (physician) and Advanced Practice Providers (APPs -  Physician Assistants and Nurse Practitioners) who all work together to provide you with the care you need, when you need it.  You will need a follow up appointment in 3 months  Providers on your designated Care Team:   Nicolasa Ducking, NP Eula Listen, PA-C Cadence Fransico Michael, PA-C   [facility] DEPT OF [place]. [facility] AT [facility] [address] [place] [address] Dept: [phone] Loc: [phone]  Juan [name]  07/11/2022  You are scheduled for a Cardiac Catheterization on Wednesday, June 26 with Dr. Lorine Bears.  1. Please arrive at the [facility] (Main Entrance A) at [facility]: [address] [place], [place] [address] at 6:30 AM (This time is 2 hour(s) before your procedure to ensure your preparation). Free valet parking service is available. You will check in at ADMITTING. The support person will be asked to wait in the waiting room.  It is OK to have someone drop you off and come back when you are ready to be discharged.    Special note: Every effort is made to have your procedure done on time. Please understand that emergencies sometimes delay scheduled procedures.  2. Diet: Do not eat solid foods after midnight.  The patient may have clear liquids until 5am upon the day of the procedure.  3. Labs:   Your provider would like for you to have following labs drawn: CBC.  CMP completed at primary care 07/11/22.  Please go to the [facility] entrance and check in at the front desk.  You do not  need an appointment.  They are open from 7am-6 pm.    4. Medication instructions in preparation for your procedure:   Contrast Allergy: No  Hold Jardiance the morning of procedure. Hold Metformin the morning of procedure and 48 hours after the procedure.     On the morning of your procedure, take your Aspirin 81 mg and Plavix/Clopidogrel and any morning medicines NOT listed above.  You may use sips of water.  5. Plan to go home the same day, you will only stay overnight if medically necessary. 6. Bring a current list of your medications and current insurance cards. 7. You MUST have a responsible person to drive you home. 8. Someone MUST be with you the first 24 hours after you arrive home or your discharge will be delayed. 9. Please wear clothes that are easy to get on and off and wear slip-on shoes.  Thank you for allowing [place] to care for you!   -- [place] [facility]

## 2022-07-26 ENCOUNTER — Other Ambulatory Visit: Payer: Self-pay

## 2022-07-26 ENCOUNTER — Encounter (HOSPITAL_COMMUNITY)
Admission: RE | Disposition: A | Discharge status: Home / Self Care | Payer: Self-pay | Source: Home / Self Care | Attending: Cardiovascular Disease

## 2022-07-26 ENCOUNTER — Telehealth: Payer: Self-pay | Admitting: Cardiology

## 2022-07-26 ENCOUNTER — Ambulatory Visit (HOSPITAL_COMMUNITY)
Admission: RE | Admit: 2022-07-26 | Discharge: 2022-07-26 | Disposition: A | Discharge status: Home / Self Care | Payer: Medicare Other | Attending: Cardiovascular Disease | Admitting: Cardiovascular Disease

## 2022-07-26 DIAGNOSIS — I2510 Atherosclerotic heart disease of native coronary artery without angina pectoris: Secondary | ICD-10-CM | POA: Diagnosis not present

## 2022-07-26 DIAGNOSIS — I480 Paroxysmal atrial fibrillation: Secondary | ICD-10-CM | POA: Diagnosis not present

## 2022-07-26 DIAGNOSIS — I2582 Chronic total occlusion of coronary artery: Secondary | ICD-10-CM | POA: Diagnosis not present

## 2022-07-26 DIAGNOSIS — R0609 Other forms of dyspnea: Secondary | ICD-10-CM | POA: Insufficient documentation

## 2022-07-26 DIAGNOSIS — I2584 Coronary atherosclerosis due to calcified coronary lesion: Secondary | ICD-10-CM | POA: Insufficient documentation

## 2022-07-26 DIAGNOSIS — I25702 Atherosclerosis of coronary artery bypass graft(s), unspecified, with refractory angina pectoris: Secondary | ICD-10-CM | POA: Insufficient documentation

## 2022-07-26 DIAGNOSIS — Z955 Presence of coronary angioplasty implant and graft: Secondary | ICD-10-CM | POA: Insufficient documentation

## 2022-07-26 DIAGNOSIS — Z7902 Long term (current) use of antithrombotics/antiplatelets: Secondary | ICD-10-CM | POA: Diagnosis not present

## 2022-07-26 DIAGNOSIS — Z951 Presence of aortocoronary bypass graft: Secondary | ICD-10-CM | POA: Diagnosis not present

## 2022-07-26 DIAGNOSIS — E782 Mixed hyperlipidemia: Secondary | ICD-10-CM | POA: Insufficient documentation

## 2022-07-26 DIAGNOSIS — Z7982 Long term (current) use of aspirin: Secondary | ICD-10-CM | POA: Insufficient documentation

## 2022-07-26 DIAGNOSIS — E119 Type 2 diabetes mellitus without complications: Secondary | ICD-10-CM | POA: Diagnosis not present

## 2022-07-26 DIAGNOSIS — I200 Unstable angina: Secondary | ICD-10-CM | POA: Diagnosis present

## 2022-07-26 DIAGNOSIS — Z79899 Other long term (current) drug therapy: Secondary | ICD-10-CM | POA: Insufficient documentation

## 2022-07-26 DIAGNOSIS — I10 Essential (primary) hypertension: Secondary | ICD-10-CM | POA: Diagnosis not present

## 2022-07-26 HISTORY — PX: CORONARY STENT INTERVENTION: CATH118234

## 2022-07-26 HISTORY — PX: LEFT HEART CATH AND CORS/GRAFTS ANGIOGRAPHY: CATH118250

## 2022-07-26 LAB — BASIC METABOLIC PANEL
Anion gap: 18 — ABNORMAL HIGH (ref 5–15)
BUN: 20 mg/dL (ref 8–23)
CO2: 22 mmol/L (ref 22–32)
Calcium: 9.4 mg/dL (ref 8.9–10.3)
Chloride: 99 mmol/L (ref 98–111)
Creatinine, Ser: 0.78 mg/dL (ref 0.61–1.24)
GFR, Estimated: >60 mL/min (ref >60)
Glucose, Bld: 151 mg/dL — ABNORMAL HIGH (ref 70–99)
Potassium: 4.2 mmol/L (ref 3.5–5.1)
Sodium: 139 mmol/L (ref 135–145)

## 2022-07-26 LAB — POCT ACTIVATED CLOTTING TIME
Activated Clotting Time: 709 seconds
Activated Clotting Time: 293 seconds
Activated Clotting Time: 672 seconds

## 2022-07-26 LAB — GLUCOSE, CAPILLARY: Glucose-Capillary: 165 mg/dL — ABNORMAL HIGH (ref 70–99)

## 2022-07-26 SURGERY — LEFT HEART CATH AND CORS/GRAFTS ANGIOGRAPHY
Anesthesia: LOCAL

## 2022-07-26 MED ORDER — MIDAZOLAM HCL 2 MG/2ML IJ SOLN
INTRAMUSCULAR | Status: AC
  Filled 2022-07-26: qty 2

## 2022-07-26 MED ORDER — ASPIRIN 81 MG PO CHEW: 81.0000 mg | CHEWABLE_TABLET | ORAL | Status: DC

## 2022-07-26 MED ORDER — CLOPIDOGREL BISULFATE 300 MG PO TABS
ORAL_TABLET | ORAL | Status: DC | PRN
  Administered 2022-07-26: 300 mg via ORAL

## 2022-07-26 MED ORDER — HEPARIN SODIUM (PORCINE) 1000 UNIT/ML IJ SOLN
INTRAMUSCULAR | Status: AC
  Filled 2022-07-26: qty 10

## 2022-07-26 MED ORDER — SODIUM CHLORIDE 0.9% FLUSH: 3.0000 mL | Freq: Two times a day (BID) | INTRAVENOUS | Status: DC

## 2022-07-26 MED ORDER — FENTANYL CITRATE (PF) 100 MCG/2ML IJ SOLN
INTRAMUSCULAR | Status: AC
  Filled 2022-07-26: qty 2

## 2022-07-26 MED ORDER — VERAPAMIL HCL 2.5 MG/ML IV SOLN
INTRAVENOUS | Status: DC | PRN
  Administered 2022-07-26: 10 mL via INTRA_ARTERIAL

## 2022-07-26 MED ORDER — SODIUM CHLORIDE 0.9 % WEIGHT BASED INFUSION: 1.0000 mL/kg/h | INTRAVENOUS | Status: DC

## 2022-07-26 MED ORDER — NITROGLYCERIN 1 MG/10 ML FOR IR/CATH LAB
INTRA_ARTERIAL | Status: AC
  Filled 2022-07-26: qty 10

## 2022-07-26 MED ORDER — HEPARIN (PORCINE) IN NACL 1000-0.9 UT/500ML-% IV SOLN
INTRAVENOUS | Status: DC | PRN
  Administered 2022-07-26 (×2): 500 mL

## 2022-07-26 MED ORDER — SODIUM CHLORIDE 0.9 % IV SOLN: 250.0000 mL | INTRAVENOUS | Status: DC | PRN

## 2022-07-26 MED ORDER — MIDAZOLAM HCL 2 MG/2ML IJ SOLN
INTRAMUSCULAR | Status: DC | PRN
  Administered 2022-07-26 (×2): 1 mg via INTRAVENOUS

## 2022-07-26 MED ORDER — CLOPIDOGREL BISULFATE 300 MG PO TABS
ORAL_TABLET | ORAL | Status: AC
  Filled 2022-07-26: qty 1

## 2022-07-26 MED ORDER — VERAPAMIL HCL 2.5 MG/ML IV SOLN
INTRAVENOUS | Status: AC
  Filled 2022-07-26: qty 2

## 2022-07-26 MED ORDER — FENTANYL CITRATE (PF) 100 MCG/2ML IJ SOLN
INTRAMUSCULAR | Status: DC | PRN
  Administered 2022-07-26: 25 ug via INTRAVENOUS
  Administered 2022-07-26: 50 ug via INTRAVENOUS

## 2022-07-26 MED ORDER — ACETAMINOPHEN 325 MG PO TABS: 650.0000 mg | ORAL_TABLET | ORAL | Status: DC | PRN

## 2022-07-26 MED ORDER — LIDOCAINE HCL (PF) 1 % IJ SOLN
INTRAMUSCULAR | Status: AC
  Filled 2022-07-26: qty 30

## 2022-07-26 MED ORDER — SODIUM CHLORIDE 0.9 % IV SOLN: INTRAVENOUS | Status: DC

## 2022-07-26 MED ORDER — LIDOCAINE HCL (PF) 1 % IJ SOLN
INTRAMUSCULAR | Status: DC | PRN
  Administered 2022-07-26: 2 mL

## 2022-07-26 MED ORDER — ONDANSETRON HCL 4 MG/2ML IJ SOLN: 4.0000 mg | Freq: Four times a day (QID) | INTRAMUSCULAR | Status: DC | PRN

## 2022-07-26 MED ORDER — SODIUM CHLORIDE 0.9% FLUSH: 3.0000 mL | INTRAVENOUS | Status: DC | PRN

## 2022-07-26 MED ORDER — HEPARIN SODIUM (PORCINE) 1000 UNIT/ML IJ SOLN
INTRAMUSCULAR | Status: DC | PRN
  Administered 2022-07-26 (×2): 5000 [IU] via INTRAVENOUS

## 2022-07-26 MED ORDER — SODIUM CHLORIDE 0.9 % WEIGHT BASED INFUSION
3.0000 mL/kg/h | INTRAVENOUS | Status: AC
  Administered 2022-07-26: 3 mL/kg/h via INTRAVENOUS

## 2022-07-26 MED ORDER — IOHEXOL 350 MG/ML SOLN
INTRAVENOUS | Status: DC | PRN
  Administered 2022-07-26: 155 mL

## 2022-07-26 SURGICAL SUPPLY — 23 items
BALL SAPPHIRE NC24 2.50X18 (BALLOONS) ×1
BALLN EMERGE MR 2.0X12 (BALLOONS) ×1
BALLN ~~LOC~~ EMERGE MR 2.75X20 (BALLOONS) ×1
BALLOON EMERGE MR 2.0X12 (BALLOONS) IMPLANT
BALLOON SAPPHIRE NC24 2.50X18 (BALLOONS) IMPLANT
BALLOON ~~LOC~~ EMERGE MR 2.75X20 (BALLOONS) IMPLANT
CATH GUIDELINER COAST (CATHETERS) IMPLANT
CATH INFINITI 5 FR IM (CATHETERS) IMPLANT
CATH INFINITI 5FR JL4 (CATHETERS) IMPLANT
CATH INFINITI AMBI 5FR TG (CATHETERS) IMPLANT
CATH LAUNCHER 6FR AL.75 (CATHETERS) IMPLANT
DEVICE RAD COMP TR BAND LRG (VASCULAR PRODUCTS) IMPLANT
ELECT DEFIB PAD ADLT CADENCE (PAD) IMPLANT
GLIDESHEATH SLEND SS 6F .021 (SHEATH) IMPLANT
GUIDEWIRE INQWIRE 1.5J.035X260 (WIRE) IMPLANT
INQWIRE 1.5J .035X260CM (WIRE) ×1
KIT ENCORE 26 ADVANTAGE (KITS) IMPLANT
KIT HEART LEFT (KITS) ×1 IMPLANT
PACK CARDIAC CATHETERIZATION (CUSTOM PROCEDURE TRAY) ×1 IMPLANT
STENT ONYX FRONTIER 2.5X22 (Permanent Stent) IMPLANT
TRANSDUCER W/STOPCOCK (MISCELLANEOUS) ×1 IMPLANT
TUBING CIL FLEX 10 FLL-RA (TUBING) ×1 IMPLANT
WIRE RUNTHROUGH .014X180CM (WIRE) IMPLANT

## 2022-07-26 NOTE — Discharge Summary (Signed)
Discharge Summary for Same Day PCI   Patient ID: Juan [name] MRN: [id number]; DOB: Jan 28, 1956  Admit date: 07/26/2022 Discharge date: 07/26/2022  Primary Care Provider: Jerl Mina, MD  Primary Cardiologist: Juan Nordmann, MD  Primary Electrophysiologist:  None   Discharge Diagnoses    Active Problems:   Unstable angina [facility])  Diagnostic Studies/Procedures    Cardiac Catheterization 07/26/2022:    Ost Cx to Prox Cx lesion is 90% stenosed.   Mid Cx lesion is 90% stenosed with 90% stenosed side branch in 2nd Mrg.   Prox Cx to Mid Cx lesion is 55% stenosed.   Prox RCA lesion is 40% stenosed.   Mid LAD-1 lesion is 85% stenosed.   Mid LAD-2 lesion is 20% stenosed.   Mid LAD-3 lesion is 100% stenosed.   Dist LAD lesion is 50% stenosed.   Prox LAD to Mid LAD lesion is 70% stenosed.   Origin to Insertion lesion is 100% stenosed.   Dist Graft lesion is 30% stenosed.   Origin to Insertion lesion is 100% stenosed.   1st Mrg lesion is 90% stenosed.   Dist RCA lesion is 90% stenosed.   Prox RCA to Mid RCA lesion is 60% stenosed.   RPDA lesion is 80% stenosed.   Non-stenotic Mid RCA lesion was previously treated.   Non-stenotic Origin lesion was previously treated.   Non-stenotic Mid Graft lesion was previously treated.   A drug-eluting stent was successfully placed using a STENT ONYX FRONTIER 2.5X22.   Balloon angioplasty was performed using a BALLN  EMERGE MR 2.75X20.   Post intervention, there is a 0% residual stenosis.   Post intervention, there is a 10% residual stenosis.   SVG.   LIMA and is normal in caliber.   SVG and is normal in caliber.   SVG.   The graft exhibits no disease.   1.  Severe underlying three-vessel coronary artery disease with chronically occluded SVG to right PDA and SVG to OM 3.  Patent LIMA to LAD and patent SVG to OM1 with patent stents and no significant restenosis.  Patent proximal and mid RCA stents with moderate restenosis in the mid  to distal area and severe distal RCA stenosis before the bifurcation of the right PDA and posterior AV groove. 2.  Left ventricular angiography was not performed.  EF was 50 to 55% by echo. 3.  Moderately elevated left ventricular end-diastolic pressure at 24 mmHg. 4.  Successful complex PCI and drug-eluting stent placement to the distal right coronary artery.  Very difficult procedure due to calcifications and previous stents with difficulty delivering equipment.  A guide liner catheter was used.  In addition, balloon angioplasty was performed on the mid and proximal RCA stents to facilitate equipment delivery.   Recommendations: Continue definite dual antiplatelet therapy. Continue aggressive treatment of risk factors. The left circumflex after OM 1 is diffusely diseased but there is severe ostial stenosis of the left circumflex and this might be a target for PCI if the patient continues to have angina.  Diagnostic Dominance: Right  Intervention   _____________   History of Present Illness     Juan [name] is a 67 y.o. male with PMH of CAD s/p CABG, post Afib, HTN, HLD, DM who recently presented to the office on 6/11 with Dr. Mariah [name]. He reported intermittent episode of worsening shortness of breath and chest tightness on exertion.  He was seen in the office November 2023 and reported having exertional chest discomfort and noted he  could not exercise for extended periods of time without developing shortness of breath.  Mostly noticed this with climbing stairs as well.  Was consideration given to undergoing stress testing.  He recently went on vacation to [place] with his wife and was noticing increasing dyspnea on exertion during that trip.  When he was seen back in the office on 6/11 further discussion was had regarding proceeding forward with cardiac catheterization to redefine anatomy.  Patient was agreeable and this was scheduled.  Hospital Course     The patient underwent  cardiac cath as noted above with successful but complex PCI/DES to the distal RCA.  Difficult procedure due to calcifications and previous stenting with difficulty delivering equipment.  Balloon angioplasty was also performed to the mid/proximal RCA stents to facilitate equipment delivery. Plan for DAPT with ASA/Plavix for at least indefinitely.  Of note left circumflex just after OM1 is diffusely diseased but severe ostial stenosis of the circumflex which may be a target for future PCI if patient continues to have angina.  The patient was seen by cardiac rehab while in short stay. There were no observed complications post cath. Radial cath site was re-evaluated prior to discharge and found to be stable without any complications. Instructions/precautions regarding cath site care were given prior to discharge.  Juan [name] was seen by Dr. Kirke [name] and determined stable for discharge home. Follow up with our office has been arranged. Medications are listed below. Pertinent changes include n/a.  _____________  Cath/PCI Registry Performance & Quality Measures: Aspirin prescribed? - Yes ADP Receptor Inhibitor (Plavix/Clopidogrel, Brilinta/Ticagrelor or Effient/Prasugrel) prescribed (includes medically managed patients)? - Yes High Intensity Statin (Lipitor 40-80mg  or Crestor 20-40mg ) prescribed? - No - currently tolerating simvastatin 40, reluctant to change statins, did not tolerate Zetia and reluctant to add PCSK9 in the setting of cost For EF <40%, was ACEI/ARB prescribed? - Not Applicable (EF >/= 40%) For EF <40%, Aldosterone Antagonist (Spironolactone or Eplerenone) prescribed? - Not Applicable (EF >/= 40%) Cardiac Rehab Phase II ordered (Included Medically managed Patients)? - Yes  _____________   Discharge Vitals Blood pressure 108/67, pulse 64, temperature 98.2 F (36.8 C), temperature source Temporal, resp. rate 14, height 5\' 8"  (1.727 m), weight 82.1 kg, SpO2 96 %.  Filed Weights    07/26/22 0723  Weight: 82.1 kg    Last Labs & Radiologic Studies    CBC No results for input(s): "WBC", "NEUTROABS", "HGB", "HCT", "MCV", "PLT" in the last 72 hours. Basic Metabolic Panel Recent Labs    96/04/54 0809  NA 139  K 4.2  CL 99  CO2 22  GLUCOSE 151*  BUN 20  CREATININE 0.78  CALCIUM 9.4   Liver Function Tests No results for input(s): "AST", "ALT", "ALKPHOS", "BILITOT", "PROT", "ALBUMIN" in the last 72 hours. No results for input(s): "LIPASE", "AMYLASE" in the last 72 hours. High Sensitivity Troponin:   No results for input(s): "TROPONINIHS" in the last 720 hours.  BNP Invalid input(s): "POCBNP" D-Dimer No results for input(s): "DDIMER" in the last 72 hours. Hemoglobin A1C No results for input(s): "HGBA1C" in the last 72 hours. Fasting Lipid Panel No results for input(s): "CHOL", "HDL", "LDLCALC", "TRIG", "CHOLHDL", "LDLDIRECT" in the last 72 hours. Thyroid Function Tests No results for input(s): "TSH", "T4TOTAL", "T3FREE", "THYROIDAB" in the last 72 hours.  Invalid input(s): "FREET3" _____________  CARDIAC CATHETERIZATION  Result Date: 07/26/2022   Suezanne Jacquet Cx to Prox Cx lesion is 90% stenosed.   Mid Cx lesion is 90% stenosed with 90%  stenosed side branch in 2nd Mrg.   Prox Cx to Mid Cx lesion is 55% stenosed.   Prox RCA lesion is 40% stenosed.   Mid LAD-1 lesion is 85% stenosed.   Mid LAD-2 lesion is 20% stenosed.   Mid LAD-3 lesion is 100% stenosed.   Dist LAD lesion is 50% stenosed.   Prox LAD to Mid LAD lesion is 70% stenosed.   Origin to Insertion lesion is 100% stenosed.   Dist Graft lesion is 30% stenosed.   Origin to Insertion lesion is 100% stenosed.   1st Mrg lesion is 90% stenosed.   Dist RCA lesion is 90% stenosed.   Prox RCA to Mid RCA lesion is 60% stenosed.   RPDA lesion is 80% stenosed.   Non-stenotic Mid RCA lesion was previously treated.   Non-stenotic Origin lesion was previously treated.   Non-stenotic Mid Graft lesion was previously treated.   A  drug-eluting stent was successfully placed using a STENT ONYX FRONTIER 2.5X22.   Balloon angioplasty was performed using a BALLN [place] EMERGE MR 2.75X20.   Post intervention, there is a 0% residual stenosis.   Post intervention, there is a 10% residual stenosis.   SVG.   LIMA and is normal in caliber.   SVG and is normal in caliber.   SVG.   The graft exhibits no disease. 1.  Severe underlying three-vessel coronary artery disease with chronically occluded SVG to right PDA and SVG to OM 3.  Patent LIMA to LAD and patent SVG to OM1 with patent stents and no significant restenosis.  Patent proximal and mid RCA stents with moderate restenosis in the mid to distal area and severe distal RCA stenosis before the bifurcation of the right PDA and posterior AV groove. 2.  Left ventricular angiography was not performed.  EF was 50 to 55% by echo. 3.  Moderately elevated left ventricular end-diastolic pressure at 24 mmHg. 4.  Successful complex PCI and drug-eluting stent placement to the distal right coronary artery.  Very difficult procedure due to calcifications and previous stents with difficulty delivering equipment.  A guide liner catheter was used.  In addition, balloon angioplasty was performed on the mid and proximal RCA stents to facilitate equipment delivery. Recommendations: Continue definite dual antiplatelet therapy. Continue aggressive treatment of risk factors. The left circumflex after OM 1 is diffusely diseased but there is severe ostial stenosis of the left circumflex and this might be a target for PCI if the patient continues to have angina.    Disposition   Pt is being discharged home today in good condition.  Follow-up Plans & Appointments     Follow-up Information     Charlsie Quest, NP Follow up on 08/07/2022.   Specialty: Cardiology Why: at 10:55am for your follow up appt Contact information: [address] [place] [address] [phone]                 Discharge Instructions     Amb Referral to Cardiac Rehabilitation   Complete by: As directed    Diagnosis:  Coronary Stents PTCA     After initial evaluation and assessments completed: Virtual Based Care may be provided alone or in conjunction with Phase 2 Cardiac Rehab based on patient barriers.: Yes   Intensive Cardiac Rehabilitation (ICR) [facility] location only OR Traditional Cardiac Rehabilitation (TCR) *If criteria for ICR are not met will enroll in TCR [facility] only): Yes        Discharge Medications   Allergies as  of 07/26/2022       Reactions   Grass Pollen(k-o-r-t-swt Vern) Other (See Comments)   Causes sneezing        Medication List     TAKE these medications    aspirin EC 81 MG tablet Take 81 mg by mouth daily.   cholecalciferol 25 MCG (1000 UNIT) tablet Commonly known as: VITAMIN D3 Take 1,000 Units by mouth daily.   clopidogrel 75 MG tablet Commonly known as: PLAVIX Take 1 tablet (75 mg total) by mouth daily.   Continuous Blood Gluc Sensor Misc 1 each by Does not apply route as directed. Use as directed every 10 days. May dispense FreeStyle [employer] or similar.   CoQ10 100 MG Caps Take 100 mg by mouth daily.   empagliflozin 25 MG Tabs tablet Commonly known as: JARDIANCE Take 25 mg by mouth daily.   isosorbide mononitrate 30 MG 24 hr tablet Commonly known as: IMDUR TAKE 1 TABLET BY MOUTH DAILY   levothyroxine 100 MCG tablet Commonly known as: Synthroid Take 1 tablet (100 mcg total) by mouth daily before breakfast.   metFORMIN 500 MG 24 hr tablet Commonly known as: GLUCOPHAGE-XR Take 1,000 mg by mouth 2 (two) times daily.   metoprolol tartrate 100 MG tablet Commonly known as: LOPRESSOR TAKE 1 TABLET BY MOUTH TWICE  DAILY   multivitamin with minerals Tabs tablet Take 1 tablet by mouth 2 (two) times daily.   nitroGLYCERIN 0.4 MG SL tablet Commonly known as: NITROSTAT Place 1 tablet (0.4 mg total) under the tongue every 5 (five)  minutes as needed for chest pain.   nystatin cream Commonly known as: MYCOSTATIN Apply 1 Application topically 2 (two) times daily as needed for dry skin.   OMEGA-3 FISH OIL PO Take 900 mg by mouth daily.   ramipril 10 MG capsule Commonly known as: ALTACE TAKE 1 CAPSULE BY MOUTH DAILY   simvastatin 40 MG tablet Commonly known as: ZOCOR Take 1 tablet (40 mg total) by mouth every evening.        Allergies Allergies  Allergen Reactions   Grass Pollen(K-O-R-T-Swt Vern) Other (See Comments)    Causes sneezing    Outstanding Labs/Studies   N/a   Duration of Discharge Encounter   Greater than 30 minutes including physician time.  Signed, Laverda Page, NP 07/26/2022, 3:04 PM

## 2022-07-26 NOTE — Telephone Encounter (Signed)
   Transition of Care Follow-up Phone Call Request    Patient Name: Juan [name] Date of Birth: [birth year] Date of Encounter: 07/26/2022  Primary Care Provider:  Jerl Mina, MD Primary Cardiologist:  Julien Nordmann, MD  Juan [name] has been scheduled for a transition of care follow up appointment with a [facility] provider:  Charlsie Quest 7/5  Please reach out to Juan [name] within 48 hours to confirm appointment and review transition of care protocol questionnaire.  Laverda Page, NP  07/26/2022, 11:10 AM

## 2022-07-26 NOTE — Interval H&P Note (Signed)
Cath Lab Visit (complete for each Cath Lab visit)  Clinical Evaluation Leading to the Procedure:   ACS: No.  Non-ACS:    Anginal Classification: CCS III  Anti-ischemic medical therapy: Maximal Therapy (2 or more classes of medications)  Non-Invasive Test Results: No non-invasive testing performed  Prior CABG: Previous CABG      History and Physical Interval Note:  07/26/2022 8:41 AM  Juan [name]  has presented today for surgery, with the diagnosis of unstable angina.  The various methods of treatment have been discussed with the patient and family. After consideration of risks, benefits and other options for treatment, the patient has consented to  Procedure(s): LEFT HEART CATH AND CORS/GRAFTS ANGIOGRAPHY (N/A) as a surgical intervention.  The patient's history has been reviewed, patient examined, no change in status, stable for surgery.  I have reviewed the patient's chart and labs.  Questions were answered to the patient's satisfaction.     Lorine Bears

## 2022-07-26 NOTE — Progress Notes (Signed)
Discussed stent, restrictions, Plavix, diet, exercise, NTG and CPRII. Pt receptive. Will refer to [facility] CRPII however pt is contemplative as he has done program before.  [id number] Ethelda Chick BS, ACSM-CEP 07/26/2022 12:28 PM

## 2022-07-26 NOTE — Telephone Encounter (Signed)
Patient contacted regarding discharge from [facility].   Patient understands to follow up with provider ? On Friday 08/04/22 at 10:55 AM at [facility].  Patient understands discharge instructions? Yes   Patient understands medications and regiment? Yes Patient understands to bring all medications to this visit? Yes

## 2022-07-26 NOTE — Discharge Instructions (Signed)
Radial Site Care  This sheet gives you information about how to care for yourself after your procedure. Your health care provider may also give you more specific instructions. If you have problems or questions, contact your health care provider. What can I expect after the procedure? After the procedure, it is common to have: Bruising and tenderness at the catheter insertion area. Follow these instructions at home: Medicines Take over-the-counter and prescription medicines only as told by your health care provider. Insertion site care Follow instructions from your health care provider about how to take care of your insertion site. Make sure you: Wash your hands with soap and water before you remove your bandage (dressing). If soap and water are not available, use hand sanitizer. May remove dressing in 24 hours. Check your insertion site every day for signs of infection. Check for: Redness, swelling, or pain. Fluid or blood. Pus or a bad smell. Warmth. Do no take baths, swim, or use a hot tub for 5 days. You may shower 24-48 hours after the procedure. Remove the dressing and gently wash the site with plain soap and water. Pat the area dry with a clean towel. Do not rub the site. That could cause bleeding. Do not apply powder or lotion to the site. Activity  For 24 hours after the procedure, or as directed by your health care provider: Do not flex or bend the affected arm. Do not push or pull heavy objects with the affected arm. Do not drive yourself home from the hospital or clinic. You may drive 24 hours after the procedure. Do not operate machinery or power tools. KEEP ARM ELEVATED THE REMAINDER OF THE DAY. Do not push, pull or lift anything that is heavier than 10 lb for 5 days. Ask your health care provider when it is okay to: Return to work or school. Resume usual physical activities or sports. Resume sexual activity. General instructions If the catheter site starts to  bleed, raise your arm and put firm pressure on the site. If the bleeding does not stop, get help right away. This is a medical emergency. DRINK PLENTY OF FLUIDS FOR THE NEXT 2-3 DAYS. No alcohol consumption for 24 hours after receiving sedation. If you went home on the same day as your procedure, a responsible adult should be with you for the first 24 hours after you arrive home. Keep all follow-up visits as told by your health care provider. This is important. Contact a health care provider if: You have a fever. You have redness, swelling, or yellow drainage around your insertion site. Get help right away if: You have unusual pain at the radial site. The catheter insertion area swells very fast. The insertion area is bleeding, and the bleeding does not stop when you hold steady pressure on the area. Your arm or hand becomes pale, cool, tingly, or numb. These symptoms may represent a serious problem that is an emergency. Do not wait to see if the symptoms will go away. Get medical help right away. Call your local emergency services (911 in the [place] Do not drive yourself to the hospital. Summary After the procedure, it is common to have bruising and tenderness at the site. Follow instructions from your health care provider about how to take care of your radial site wound. Check the wound every day for signs of infection.  This information is not intended to replace advice given to you by your health care provider. Make sure you discuss any questions you have with   your health care provider. Document Revised: 02/21/2017 Document Reviewed: 02/21/2017 Elsevier Patient Education  2020 [employer]  

## 2022-07-27 ENCOUNTER — Encounter (HOSPITAL_COMMUNITY): Payer: Self-pay | Admitting: Cardiovascular Disease

## 2022-08-04 ENCOUNTER — Ambulatory Visit: Payer: Medicare Other | Admitting: Cardiology

## 2022-08-07 ENCOUNTER — Ambulatory Visit: Payer: Medicare Other | Attending: Cardiology | Admitting: Cardiology

## 2022-08-07 ENCOUNTER — Encounter: Payer: Self-pay | Admitting: Cardiology

## 2022-08-07 VITALS — BP 136/74 | HR 62 | Ht 68.0 in | Wt 186.0 lb

## 2022-08-07 DIAGNOSIS — E039 Hypothyroidism, unspecified: Secondary | ICD-10-CM

## 2022-08-07 DIAGNOSIS — Z7984 Long term (current) use of oral hypoglycemic drugs: Secondary | ICD-10-CM

## 2022-08-07 DIAGNOSIS — Z951 Presence of aortocoronary bypass graft: Secondary | ICD-10-CM | POA: Diagnosis not present

## 2022-08-07 DIAGNOSIS — I25118 Atherosclerotic heart disease of native coronary artery with other forms of angina pectoris: Secondary | ICD-10-CM

## 2022-08-07 DIAGNOSIS — I10 Essential (primary) hypertension: Secondary | ICD-10-CM | POA: Diagnosis not present

## 2022-08-07 DIAGNOSIS — I480 Paroxysmal atrial fibrillation: Secondary | ICD-10-CM

## 2022-08-07 DIAGNOSIS — E1159 Type 2 diabetes mellitus with other circulatory complications: Secondary | ICD-10-CM

## 2022-08-07 DIAGNOSIS — E782 Mixed hyperlipidemia: Secondary | ICD-10-CM | POA: Diagnosis not present

## 2022-08-07 NOTE — Progress Notes (Signed)
Cardiology Office Note:  .   Date:  08/07/2022  ID:  Juan [name], DOB 1955/05/01, MRN [id number] PCP: Jerl Mina, MD  [facility] Providers Cardiologist:  Julien Nordmann, MD    History of Present Illness: .   Juan [name] is a 67 y.o. male with a past medical history of coronary artery disease (with multiple catheterizations with multiple interventions),status post CABG (01/29/2015), postoperative atrial fibrillation, hypertension, hyperlipidemia, type 2 diabetes, who presents for follow-up after having a left heart catheterization.  He has extensive cardiac history dating back to 57.  Previously had a total of 8 stents.  He also underwent CABG for three-vessel disease that was completed in 01/29/2015.  Postoperatively he did have a paroxysmal atrial fibrillation.  He has extensive history of hyperlipidemia with a difficult time tolerating numerous statins including Zetia previously had only been tolerating simvastatin.  Was last seen in clinic on 07/11/2022.  After recently being on holiday in [place].  He had started having chest pain on exertion.  Could not walk for exercise as he used to without developing shortness of breath and chest tightness.  It was severe with climbing stairs and more noticeable at that time.  During his visit his last cardiac catheterization from July 13, 2021 was revealed which showed PCI (sequential ostial and mid SVG-OM1 with stenosis use and not overlapping DES.  His residual RCA disease was treated medically.  Previous echocardiogram revealed LVEF 50-55%.  He underwent left heart catheterization on 07/26/2022 for unstable angina and had successful complex PCI/DES to the distal RCA.  The procedure was difficult due to calcifications and previous stenting with a difficult delivering equipment.  Balloon angioplasty was also performed in the mid/proximal RCA stents to facilitate equipment delivery.  Plan for DAPT with aspirin and Plavix for  indefinitely.  Of note the left circumflex just after OM1 was diffusely diseased but severe ostial stenosis of the circumflex which may be a target for future PCI patient continues to have angina.  Patient was seen by cardiac rehab while in short stay there were no observed complications post cath.  Radial cath site was reevaluated prior to discharge and found to be stable without complications.  He was able to be discharged home.  He returns to clinic today accompanied by his wife stating that he has been doing well.  He has had no further chest pain, no shortness of breath or peripheral edema no other associated symptoms.  States that he has increased his walking using up to 3-1/2 miles a day without any recurrence of his chest discomfort.  He has tolerated his medications.  He did have a question today with concerns of some redness to his face primarily with shaving and he was wondering if any of his medications exacerbated the condition.  He states that he feels better after this cath that he had after his previous cath.  ROS: 10 point review of systems has been reviewed and considered negative with exception of what is listed in the HPI  Studies Reviewed: Marland Kitchen   EKG Interpretation Date/Time:  Monday August 07 2022 11:02:38 EDT Ventricular Rate:  62 PR Interval:  130 QRS Duration:  94 QT Interval:  448 QTC Calculation: 454 R Axis:   47  Text Interpretation: Normal sinus rhythm ST & T wave abnormality, consider lateral ischemia When compared with ECG of 26-Jul-2022 10:08, No significant change was found Confirmed by Charlsie Quest ([id number]) on 08/07/2022 11:25:28 AM    Cardiac Catheterization 07/26/2022:  Ost Cx to Prox Cx lesion is 90% stenosed.   Mid Cx lesion is 90% stenosed with 90% stenosed side branch in 2nd Mrg.   Prox Cx to Mid Cx lesion is 55% stenosed.   Prox RCA lesion is 40% stenosed.   Mid LAD-1 lesion is 85% stenosed.   Mid LAD-2 lesion is 20% stenosed.   Mid LAD-3 lesion is 100%  stenosed.   Dist LAD lesion is 50% stenosed.   Prox LAD to Mid LAD lesion is 70% stenosed.   Origin to Insertion lesion is 100% stenosed.   Dist Graft lesion is 30% stenosed.   Origin to Insertion lesion is 100% stenosed.   1st Mrg lesion is 90% stenosed.   Dist RCA lesion is 90% stenosed.   Prox RCA to Mid RCA lesion is 60% stenosed.   RPDA lesion is 80% stenosed.   Non-stenotic Mid RCA lesion was previously treated.   Non-stenotic Origin lesion was previously treated.   Non-stenotic Mid Graft lesion was previously treated.   A drug-eluting stent was successfully placed using a STENT ONYX FRONTIER 2.5X22.   Balloon angioplasty was performed using a BALLN [place] EMERGE MR 2.75X20.   Post intervention, there is a 0% residual stenosis.   Post intervention, there is a 10% residual stenosis.   SVG.   LIMA and is normal in caliber.   SVG and is normal in caliber.   SVG.   The graft exhibits no disease.   1.  Severe underlying three-vessel coronary artery disease with chronically occluded SVG to right PDA and SVG to OM 3.  Patent LIMA to LAD and patent SVG to OM1 with patent stents and no significant restenosis.  Patent proximal and mid RCA stents with moderate restenosis in the mid to distal area and severe distal RCA stenosis before the bifurcation of the right PDA and posterior AV groove. 2.  Left ventricular angiography was not performed.  EF was 50 to 55% by echo. 3.  Moderately elevated left ventricular end-diastolic pressure at 24 mmHg. 4.  Successful complex PCI and drug-eluting stent placement to the distal right coronary artery.  Very difficult procedure due to calcifications and previous stents with difficulty delivering equipment.  A guide liner catheter was used.  In addition, balloon angioplasty was performed on the mid and proximal RCA stents to facilitate equipment delivery.   Recommendations: Continue definite dual antiplatelet therapy. Continue aggressive treatment of risk  factors. The left circumflex after OM 1 is diffusely diseased but there is severe ostial stenosis of the left circumflex and this might be a target for PCI if the patient continues to have angina.   Diagnostic Dominance: Right  Intervention    Risk Assessment/Calculations:             Physical Exam:   VS:  BP 136/74 (BP Location: Left Arm, Patient Position: Sitting, Cuff Size: Normal)   Pulse 62   Ht 5\' 8"  (1.727 m)   Wt 186 lb (84.4 kg)   SpO2 97%   BMI 28.28 kg/m    Wt Readings from Last 3 Encounters:  08/07/22 186 lb (84.4 kg)  07/26/22 181 lb (82.1 kg)  07/11/22 185 lb 4 oz (84 kg)    GEN: Well nourished, well developed in no acute distress NECK: No JVD; No carotid bruits CARDIAC: RRR, II/VI systolic murmur best heard at the RSB, without rubs, gallops, left radial cath site with 2+ radial pulse, small amount of bruising, no bleeding or hematoma RESPIRATORY:  Clear to  auscultation without rales, wheezing or rhonchi  ABDOMEN: Soft, non-tender, non-distended EXTREMITIES:  No edema; No deformity   ASSESSMENT AND PLAN: .   Coronary artery disease s/p CABG without angina.  Status post recent left heart catheterization which revealed severe underlying three-vessel coronary artery disease with chronically occluded SVG to right PDA and SVG to OM 3.  Patent LIMA to LAD and patent SVG to OM1 with patent stents and no significant restenosis.  Patent proximal and mid RCA stents with moderate restenosis in the mid to distal LAD.  Severe distal RCA stenosis before the bifurcation of the right PDA and posterior AV groove.  Successful complex PCI with drug-eluting stent placement to the distal right coronary artery.  Very difficult procedure due to calcifications of previous stents with difficulty delivering equipment.  Patient is continued indefinite dual antiplatelet therapy, continue aggressive treatment of risk factors, left circumflex after OM1 is diffusely diseased but there is severe  ostial stenosis of the left circumflex that might be a target for PCI patient continues to have angina.  Currently he is continued on aspirin 81 mg daily, clopidogrel 75 mg daily, Imdur 30 mg daily, and simvastatin 40 mg daily.  EKG reveals sinus rhythm without any ischemic changes noted.  Patient states he has previously participated and cardiac rehab and feels as though it is more beneficial for him and doing his all walking as he does more on the treadmill and hiking at home and then previous cardiac rehab episodes.  He was advised that cardiac rehab will call and discussed that the program is beneficial.  Mixed hyperlipidemia last LDL 59 with goal of less than 55, currently tolerating simvastatin 40 mg daily.  He has been intolerant to several statins as well as Zetia.  He is reluctant to start a PCSK9 inhibitor secondary to cost.  Hypertension with blood pressure 136/74.  Blood pressure is well-controlled on ramipril and metoprolol tartrate.  Encouraged to continue to monitor blood pressure 1 to 2 hours post medications.  Postoperative atrial fibrillation.  Brief atrial tachycardia/atrial fibrillation was started on amiodarone.  Currently not on any antiarrhythmic.  And has maintained sinus rhythm on EKG today.  Type 2 diabetes with A1c running 6.6.  Continues to exercise.  This is also continued on Jardiance and metformin.  This continues to be managed by her PCP.  Hypothyroidism where he has been maintained on levothyroxine.  This continues to be advised PCP as well.   Cardiac Rehabilitation Eligibility Assessment  The patient is ready to start cardiac rehabilitation from a cardiac standpoint.       Dispo: Patient return to clinic to see primary cardiologist at previously scheduled appointment in September or sooner if needed.  Signed, Lenzy Kerschner, NP

## 2022-08-07 NOTE — Patient Instructions (Addendum)
Medication Instructions:  Your physician recommends that you continue on your current medications as directed. Please refer to the Current Medication list given to you today.  *If you need a refill on your cardiac medications before your next appointment, please call your pharmacy*   Lab Work: none If you have labs (blood work) drawn today and your tests are completely normal, you will receive your results only by: MyChart Message (if you have MyChart) OR A paper copy in the mail If you have any lab test that is abnormal or we need to change your treatment, we will call you to review the results.   Testing/Procedures: none  Follow-Up: At [facility], you and your health needs are our priority.  As part of our continuing mission to provide you with exceptional heart care, we have created designated Provider Care Teams.  These Care Teams include your primary Cardiologist (physician) and Advanced Practice Providers (APPs -  Physician Assistants and Nurse Practitioners) who all work together to provide you with the care you need, when you need it.  We recommend signing up for the patient portal called "MyChart".  Sign up information is provided on this After Visit Summary.  MyChart is used to connect with patients for Virtual Visits (Telemedicine).  Patients are able to view lab/test results, encounter notes, upcoming appointments, etc.  Non-urgent messages can be sent to your provider as well.   To learn more about what you can do with MyChart, go to [email or web address].    Your next appointment:   Keep your appointment with Dr. Mariah Milling on October 13, 2022

## 2022-08-21 ENCOUNTER — Other Ambulatory Visit: Payer: Self-pay | Admitting: Cardiovascular Disease

## 2022-08-21 ENCOUNTER — Other Ambulatory Visit: Payer: Self-pay

## 2022-08-21 MED ORDER — FREESTYLE LIBRE 14 DAY SENSOR MISC
3 refills | Status: AC
  Filled 2022-08-21 – 2022-08-25 (×2): qty 6, 84d supply, fill #0

## 2022-08-25 ENCOUNTER — Other Ambulatory Visit: Payer: Self-pay

## 2022-09-05 ENCOUNTER — Other Ambulatory Visit: Payer: Self-pay | Admitting: Cardiovascular Disease

## 2022-09-08 ENCOUNTER — Other Ambulatory Visit: Payer: Self-pay

## 2022-10-05 ENCOUNTER — Telehealth: Payer: Self-pay | Admitting: Cardiovascular Disease

## 2022-10-05 NOTE — Telephone Encounter (Signed)
Left voicemail, pt's appt on 9/13 needs to be rescheduled due to provider being out of office. Can be scheduled with Dr Mariah Milling or Charlsie Quest, NP.

## 2022-10-13 ENCOUNTER — Ambulatory Visit: Payer: Medicare Other | Admitting: Cardiovascular Disease

## 2022-10-22 ENCOUNTER — Other Ambulatory Visit: Payer: Self-pay | Admitting: Cardiovascular Disease

## 2022-10-27 ENCOUNTER — Ambulatory Visit: Payer: Medicare Other | Attending: Cardiovascular Disease | Admitting: Cardiology

## 2022-10-27 ENCOUNTER — Encounter: Payer: Self-pay | Admitting: Cardiology

## 2022-10-27 VITALS — BP 136/84 | HR 77 | Ht 68.0 in | Wt 187.8 lb

## 2022-10-27 DIAGNOSIS — Z951 Presence of aortocoronary bypass graft: Secondary | ICD-10-CM | POA: Diagnosis not present

## 2022-10-27 DIAGNOSIS — E1159 Type 2 diabetes mellitus with other circulatory complications: Secondary | ICD-10-CM

## 2022-10-27 DIAGNOSIS — I10 Essential (primary) hypertension: Secondary | ICD-10-CM

## 2022-10-27 DIAGNOSIS — E039 Hypothyroidism, unspecified: Secondary | ICD-10-CM

## 2022-10-27 DIAGNOSIS — E782 Mixed hyperlipidemia: Secondary | ICD-10-CM

## 2022-10-27 DIAGNOSIS — I25118 Atherosclerotic heart disease of native coronary artery with other forms of angina pectoris: Secondary | ICD-10-CM

## 2022-10-27 DIAGNOSIS — I480 Paroxysmal atrial fibrillation: Secondary | ICD-10-CM

## 2022-10-27 DIAGNOSIS — L259 Unspecified contact dermatitis, unspecified cause: Secondary | ICD-10-CM

## 2022-10-27 NOTE — Patient Instructions (Signed)
Medication Instructions:  Your physician recommends that you continue on your current medications as directed. Please refer to the Current Medication list given to you today.   *If you need a refill on your cardiac medications before your next appointment, please call your pharmacy*   Lab Work: No labs ordered today    Testing/Procedures: No test ordered today    Follow-Up: At [facility], you and your health needs are our priority.  As part of our continuing mission to provide you with exceptional heart care, we have created designated Provider Care Teams.  These Care Teams include your primary Cardiologist (physician) and Advanced Practice Providers (APPs -  Physician Assistants and Nurse Practitioners) who all work together to provide you with the care you need, when you need it.  We recommend signing up for the patient portal called "MyChart".  Sign up information is provided on this After Visit Summary.  MyChart is used to connect with patients for Virtual Visits (Telemedicine).  Patients are able to view lab/test results, encounter notes, upcoming appointments, etc.  Non-urgent messages can be sent to your provider as well.   To learn more about what you can do with MyChart, go to [email or web address].    Your next appointment:   6 month(s)  Provider:   You may see Julien Nordmann, MD or one of the following Advanced Practice Providers on your designated Care Team:   Nicolasa Ducking, NP Eula Listen, PA-C Cadence Fransico Michael, PA-C Charlsie Quest, NP

## 2022-11-06 ENCOUNTER — Other Ambulatory Visit: Payer: Self-pay | Admitting: Cardiovascular Disease

## 2022-11-13 ENCOUNTER — Other Ambulatory Visit: Payer: Self-pay | Admitting: Cardiovascular Disease

## 2022-12-23 ENCOUNTER — Other Ambulatory Visit: Payer: Self-pay | Admitting: Cardiovascular Disease

## 2022-12-25 NOTE — Telephone Encounter (Addendum)
last visit 10/27/22 with plan to f/u in 6 months.  Next visit: 04/09/23

## 2023-02-01 ENCOUNTER — Other Ambulatory Visit: Payer: Self-pay | Admitting: Cardiovascular Disease

## 2023-03-07 ENCOUNTER — Other Ambulatory Visit: Payer: Self-pay | Admitting: Cardiovascular Disease

## 2023-04-06 NOTE — Progress Notes (Signed)
 Cardiology Office Note  Date:  04/09/2023   ID:  Juan, [name] March 18, 1955, MRN [id number]  PCP:  Juan Mina, MD   Chief Complaint  Patient presents with   6 month follow up     "Doing well."     HPI:  Juan [name] is a  68 year old male with a history of  coronary artery disease,  multiple cardiac catheterizations with intervention dating back to 1994,  total of 8 stents,  Diabetes, hemoglobin A1c 6.6 hyperlipidemia, difficult time tolerating numerous statins including Zetia (Tolerating simvastatin 40)  presented to  [facility] with angina,  Cardiac catheterization showing three-vessel disease,  referred to [facility] for [facility]G. 01/29/15 Paroxysmal atrial fibrillation, post op CABG Indefinite dual antiplatelet Numerous stents Stent placed to distal RCA June 2024 He presents today for follow-up of his coronary artery disease, CABG  LOV with myself June 2024 Seen by one of our providers September 2024 Presents today with his wife Denies significant chest pain, walking up stairs ok, Reports compliance with his simvastatin Cholesterol has jumped 156 up to 260 He is taking over-the-counter supplement Does not feel that Zetia helped him in the past  A1c also running higher No regular exercise program Weight relatively stable  Thinking of going on cruise to [place] next year  Lab work reviewed A1c 7.1 Total cholesterol 257 up from 150  Discussed catheterization from June 2024, stent placed to distal RCA  EKG personally reviewed by myself on todays visit EKG Interpretation Date/Time:  Monday April 09 2023 10:28:23 EDT Ventricular Rate:  62 PR Interval:  166 QRS Duration:  98 QT Interval:  440 QTC Calculation: 446 R Axis:   57  Text Interpretation: Normal sinus rhythm T wave abnormality, consider inferolateral ischemia When compared with ECG of 07-Aug-2022 11:02, No significant change was found Confirmed by Julien Nordmann [phone]) on 04/09/2023 10:51:58 AM    PMH:    has a past medical history of Childhood asthma, Coronary artery disease, Essential hypertension, GERD (gastroesophageal reflux disease), Heart murmur, History of kidney stones, Hyperlipidemia, Hypothyroidism, Mild sleep apnea, Myocardial infarction (HCC) (1996), and Type II diabetes mellitus (HCC).  PSH:    Past Surgical History:  Procedure Laterality Date   CARDIAC CATHETERIZATION  40   [place]; Dr.Harold; [facility].   CARDIAC CATHETERIZATION  2002   "total of 8 stents by now" (11/29/2016)   CARDIAC CATHETERIZATION N/A 01/21/2015   Procedure: Left Heart Cath and Coronary Angiography;  Surgeon: Iran Ouch, MD;  Location: [facility] INVASIVE CV LAB;  Service: Cardiovascular;  Laterality: N/A;   CARDIAC CATHETERIZATION  11/24/2016   "couldn't get stent in this time" (11/29/2016)   COLONOSCOPY WITH PROPOFOL N/A 04/17/2018   Procedure: COLONOSCOPY WITH PROPOFOL;  Surgeon: Toledo, Boykin Nearing, MD;  Location: [facility] ENDOSCOPY;  Service: Gastroenterology;  Laterality: N/A;   CORONARY ANGIOPLASTY WITH STENT PLACEMENT  1994   [place]; Dr. Jake Shark; [facility]   CORONARY ANGIOPLASTY WITH STENT PLACEMENT  1996   ""   CORONARY ANGIOPLASTY WITH STENT PLACEMENT     ''''   CORONARY ANGIOPLASTY WITH STENT PLACEMENT     '''   CORONARY ANGIOPLASTY WITH STENT PLACEMENT     '''   CORONARY ANGIOPLASTY WITH STENT PLACEMENT  2006   "2 stents for a total of 10 stents"; [facility]; [place].    CORONARY ANGIOPLASTY WITH STENT PLACEMENT  11/29/2016   "1 stent"   CORONARY ARTERY BYPASS GRAFT N/A 01/29/2015   Procedure: CORONARY ARTERY BYPASS GRAFTING (CABG)x four using  left internal mammary artery artery and bilateral saphenous thigh vein using endoscope.;  Surgeon: Kerin Perna, MD;  Location: [facility] OR;  Service: Open Heart Surgery;  Laterality: N/A;   CORONARY STENT INTERVENTION N/A 11/24/2016   Procedure: CORONARY STENT INTERVENTION;  Surgeon: Iran Ouch, MD;  Location: [facility] INVASIVE CV LAB;   Service: Cardiovascular;  Laterality: N/A;   CORONARY STENT INTERVENTION N/A 11/29/2016   Procedure: CORONARY STENT INTERVENTION;  Surgeon: Iran Ouch, MD;  Location: [facility] INVASIVE CV LAB;  Service: Cardiovascular;  Laterality: N/A;   CORONARY STENT INTERVENTION N/A 07/13/2021   Procedure: CORONARY STENT INTERVENTION;  Surgeon: Yvonne Kendall, MD;  Location: [facility] INVASIVE CV LAB;  Service: Cardiovascular;  Laterality: N/A;   CORONARY STENT INTERVENTION N/A 07/26/2022   Procedure: CORONARY STENT INTERVENTION;  Surgeon: Iran Ouch, MD;  Location: [facility] INVASIVE CV LAB;  Service: Cardiovascular;  Laterality: N/A;   GANGLION CYST EXCISION Right    LEFT HEART CATH AND CORONARY ANGIOGRAPHY Left 11/24/2016   Procedure: LEFT HEART CATH AND CORONARY ANGIOGRAPHY;  Surgeon: Iran Ouch, MD;  Location: [facility] INVASIVE CV LAB;  Service: Cardiovascular;  Laterality: Left;   LEFT HEART CATH AND CORONARY ANGIOGRAPHY N/A 07/13/2021   Procedure: LEFT HEART CATH AND CORONARY ANGIOGRAPHY;  Surgeon: Yvonne Kendall, MD;  Location: [facility] INVASIVE CV LAB;  Service: Cardiovascular;  Laterality: N/A;   LEFT HEART CATH AND CORS/GRAFTS ANGIOGRAPHY N/A 07/26/2022   Procedure: LEFT HEART CATH AND CORS/GRAFTS ANGIOGRAPHY;  Surgeon: Iran Ouch, MD;  Location: [facility] LAB;  Service: Cardiovascular;  Laterality: N/A;   NASAL SEPTUM SURGERY     TEE WITHOUT CARDIOVERSION N/A 01/29/2015   Procedure: TRANSESOPHAGEAL ECHOCARDIOGRAM (TEE);  Surgeon: Kerin Perna, MD;  Location: [facility] OR;  Service: Open Heart Surgery;  Laterality: N/A;   THYROIDECTOMY     Current Outpatient Medications on File Prior to Visit  Medication Sig Dispense Refill   aspirin EC 81 MG tablet Take 81 mg by mouth daily.     cholecalciferol (VITAMIN D3) 25 MCG (1000 UNIT) tablet Take 1,000 Units by mouth daily.     clopidogrel (PLAVIX) 75 MG tablet TAKE 1 TABLET BY MOUTH DAILY 100 tablet 2   Coenzyme Q10 (COQ10) 100 MG CAPS Take 100 mg  by mouth daily.     Continuous Glucose Sensor (FREESTYLE LIBRE 14 DAY SENSOR) MISC Use 1 kit every 14 (fourteen) days 6 each 3   empagliflozin (JARDIANCE) 25 MG TABS tablet Take 25 mg by mouth daily.     isosorbide mononitrate (IMDUR) 30 MG 24 hr tablet TAKE 1 TABLET BY MOUTH DAILY 90 tablet 2   levothyroxine (SYNTHROID) 100 MCG tablet Take 1 tablet (100 mcg total) by mouth daily before breakfast. 90 tablet 0   metFORMIN (GLUCOPHAGE-XR) 500 MG 24 hr tablet Take 1,000 mg by mouth 2 (two) times daily.     metoprolol tartrate (LOPRESSOR) 100 MG tablet TAKE 1 TABLET BY MOUTH TWICE  DAILY 180 tablet 3   Multiple Vitamin (MULTIVITAMIN WITH MINERALS) TABS tablet Take 1 tablet by mouth 2 (two) times daily.     nitroGLYCERIN (NITROSTAT) 0.4 MG SL tablet Place 1 tablet (0.4 mg total) under the tongue every 5 (five) minutes as needed for chest pain. 25 tablet 1   Omega-3 Fatty Acids (OMEGA-3 FISH OIL PO) Take 900 mg by mouth daily.     ramipril (ALTACE) 10 MG capsule TAKE 1 CAPSULE BY MOUTH DAILY 90 capsule 1   simvastatin (ZOCOR) 40 MG tablet  Take 1 tablet (40 mg total) by mouth every evening. 90 tablet 3   No current facility-administered medications on file prior to visit.    Allergies:   Grass pollen([id number]-o-r-t-swt vern)   Social History:  The patient  reports that he has been smoking cigars. He has never used smokeless tobacco. He reports current alcohol use. He reports that he does not use drugs.   Family History:   family history includes Cancer (age of onset: 52) in his father; Cirrhosis in his sister; Drug abuse in his sister; Emphysema in his brother; Heart attack in his father and mother.    Review of Systems: Review of Systems  Constitutional: Negative.   HENT: Negative.    Respiratory: Negative.    Cardiovascular: Negative.   Gastrointestinal: Negative.   Musculoskeletal: Negative.   Neurological: Negative.   Psychiatric/Behavioral: Negative.    All other systems reviewed and are  negative.  PHYSICAL EXAM: VS:  BP 120/64 (BP Location: Left Arm, Patient Position: Sitting, Cuff Size: Normal)   Pulse 62   Ht 5\' 8"  (1.727 m)   Wt 188 lb 2 oz (85.3 kg)   SpO2 97%   BMI 28.60 kg/m  , BMI Body mass index is 28.6 kg/m. Constitutional:  oriented to person, place, and time. No distress.  HENT:  Head: Grossly normal Eyes:  no discharge. No scleral icterus.  Neck: No JVD, no carotid bruits  Cardiovascular: Regular rate and rhythm, no murmurs appreciated Pulmonary/Chest: Clear to auscultation bilaterally, no wheezes or rails Abdominal: Soft.  no distension.  no tenderness.  Musculoskeletal: Normal range of motion Neurological:  normal muscle tone. Coordination normal. No atrophy Skin: Skin warm and dry Psychiatric: normal affect, pleasant  Recent Labs: 07/11/2022: Hemoglobin 15.9; Platelets 119 07/26/2022: BUN 20; Creatinine, Ser 0.78; Potassium 4.2; Sodium 139    Lipid Panel Lab Results  Component Value Date   CHOL 197 02/16/2014   HDL 34.10 (L) 02/16/2014   LDLCALC 61 04/11/2013   TRIG 402.0 (H) 02/16/2014    Wt Readings from Last 3 Encounters:  04/09/23 188 lb 2 oz (85.3 kg)  10/27/22 187 lb 12.8 oz (85.2 kg)  08/07/22 186 lb (84.4 kg)     ASSESSMENT AND PLAN:  Coronary artery disease of native artery of native heart with stable angina pectoris (HCC) Discussed recent cardiac catheterization June 2024 stent placed to distal RCA Given numerous stents will keep on aspirin Plavix Long discussion concerning cholesterol which is running high 250 Reports compliance with his simvastatin, does not want Zetia Taking over-the-counter supplement Recommend we recheck lipid panel in April in 1 month Goal LDL less than 55  Mixed hyperlipidemia Reports compliance with his simvastatin 40 daily Plan as above, recheck lipids in April next month  Essential hypertension Blood pressure is well controlled on today's visit. No changes made to the  medications.  Paroxysmal atrial fibrillation (HCC) Postop A-fib Maintaining normal sinus rhythm, continue metoprolol tartrate 100 twice daily  S/P coronary artery stent placement catheterization June 2023 with stents placed to vein graft to the OM Stenting 2024 to the distal RCA  Uncontrolled type 2 diabetes mellitus with complication, with long-term current use of insulin (HCC) A1c running high, low carbohydrate diet recommended  S/P CABG (coronary artery bypass graft) Stable anginal symptoms as above     Orders Placed This Encounter  Procedures   EKG 12-Lead     Signed, Dossie Arbour, M.D., Ph.D. 04/09/2023  [facility] [place], [place] [phone]

## 2023-04-09 ENCOUNTER — Ambulatory Visit: Payer: Medicare Other | Attending: Cardiovascular Disease | Admitting: Cardiovascular Disease

## 2023-04-09 ENCOUNTER — Encounter: Payer: Self-pay | Admitting: Cardiovascular Disease

## 2023-04-09 VITALS — BP 120/64 | HR 62 | Ht 68.0 in | Wt 188.1 lb

## 2023-04-09 DIAGNOSIS — Z951 Presence of aortocoronary bypass graft: Secondary | ICD-10-CM

## 2023-04-09 DIAGNOSIS — I25110 Atherosclerotic heart disease of native coronary artery with unstable angina pectoris: Secondary | ICD-10-CM

## 2023-04-09 DIAGNOSIS — I200 Unstable angina: Secondary | ICD-10-CM

## 2023-04-09 DIAGNOSIS — E039 Hypothyroidism, unspecified: Secondary | ICD-10-CM

## 2023-04-09 DIAGNOSIS — M791 Myalgia, unspecified site: Secondary | ICD-10-CM

## 2023-04-09 DIAGNOSIS — T466X5D Adverse effect of antihyperlipidemic and antiarteriosclerotic drugs, subsequent encounter: Secondary | ICD-10-CM

## 2023-04-09 DIAGNOSIS — I25118 Atherosclerotic heart disease of native coronary artery with other forms of angina pectoris: Secondary | ICD-10-CM

## 2023-04-09 DIAGNOSIS — I10 Essential (primary) hypertension: Secondary | ICD-10-CM

## 2023-04-09 DIAGNOSIS — E1159 Type 2 diabetes mellitus with other circulatory complications: Secondary | ICD-10-CM

## 2023-04-09 DIAGNOSIS — E782 Mixed hyperlipidemia: Secondary | ICD-10-CM

## 2023-04-09 DIAGNOSIS — I480 Paroxysmal atrial fibrillation: Secondary | ICD-10-CM

## 2023-04-09 NOTE — Patient Instructions (Addendum)
 Medication Instructions:  No changes  If you need a refill on your cardiac medications before your next appointment, please call your pharmacy.   Lab work: Your provider would like for you to return in April to have the following labs drawn: Lipid Paenl.   Please go to [facility] [address] ([facility]) [address], [place] [address] You do not need an appointment.  They are open from 8 am- 4:30 pm.  Lunch from 1:00 pm- 2:00 pm You will need to be fasting.   You may also go to one of the following [facility]:  [address] [place], [place] [address] Phone: [phone] Lab hours: Mon-Fri 8 am- 5 pm    Lunch 12 pm- 1 pm  [address] [place],  [place]  [address]  [place] Phone: [phone] Lab hours: 7 am- 4 pm Lunch 12 pm-1 pm   [address] [place],  [place]  [address]  [place] Phone: [phone] Lab hours: Mon-Fri 8 am- 5 pm    Lunch 12 pm- 1 pm   Testing/Procedures: No new testing needed  Follow-Up: At [facility], you and your health needs are our priority.  As part of our continuing mission to provide you with exceptional heart care, we have created designated Provider Care Teams.  These Care Teams include your primary Cardiologist (physician) and Advanced Practice Providers (APPs -  Physician Assistants and Nurse Practitioners) who all work together to provide you with the care you need, when you need it.  You will need a follow up appointment in 12 months  Providers on your designated Care Team:   Nicolasa Ducking, NP Eula Listen, PA-C Cadence Fransico Michael, [place]  COVID-19 Vaccine Information can be found at: [email or web address] For questions related to vaccine distribution or appointments, please email [email or web address] or call [phone].

## 2023-04-24 ENCOUNTER — Telehealth: Payer: Self-pay | Admitting: Cardiovascular Disease

## 2023-04-24 ENCOUNTER — Other Ambulatory Visit: Payer: Self-pay | Admitting: Cardiovascular Disease

## 2023-04-24 MED ORDER — RAMIPRIL 10 MG PO CAPS: 10.0000 mg | ORAL_CAPSULE | Freq: Every day | ORAL | 3 refills | Status: DC

## 2023-04-24 NOTE — Telephone Encounter (Signed)
*  STAT* If patient is at the pharmacy, call can be transferred to refill team.   1. Which medications need to be refilled? (please list name of each medication and dose if known)   ramipril (ALTACE) 10 MG capsule   2. Would you like to learn more about the convenience, safety, & potential cost savings by using the [facility]?   3. Are you open to using the [facility] (Type [facility]. ).  4. Which pharmacy/location (including street and city if local pharmacy) is medication to be sent to?  [facility]) - [place],  - [address] [place]   5. Do they need a 30 day or 90 day supply?  100 day  Patient stated he still has some medication left.  Patient stated [place] and will need 2 weeks worth of this medication sent to [facility] [id number] - [place], [place] - [address]

## 2023-04-25 ENCOUNTER — Other Ambulatory Visit: Payer: Self-pay | Admitting: Cardiovascular Disease

## 2023-04-25 MED ORDER — RAMIPRIL 10 MG PO CAPS: 10.0000 mg | ORAL_CAPSULE | Freq: Every day | ORAL | 0 refills | Status: AC

## 2023-05-10 ENCOUNTER — Encounter: Payer: Self-pay | Admitting: Emergency Medicine

## 2023-07-20 ENCOUNTER — Telehealth: Payer: Self-pay | Admitting: Cardiovascular Disease

## 2023-07-20 NOTE — Telephone Encounter (Signed)
   Pre-operative Risk Assessment    Patient Name: Juan [name]  DOB: 05-20-1955 MRN: [id number]   Date of last office visit: 04/09/23 Date of next office visit: n/a   Request for Surgical Clearance    Procedure:  not indicated  Date of Surgery:  Clearance 09/12/23                                Surgeon:  Dr. Nathanael Baker Surgeon's Group or Practice Name:  [facility] Phone number:  [phone] Fax number:  [phone]   Type of Clearance Requested:   - Pharmacy:  Hold Clopidogrel  (Plavix ) follow instruction   Type of Anesthesia:  Not Indicated   Additional requests/questions:    Signed, Gwendolyn H Slade   07/20/2023, 4:38 PM

## 2023-07-23 ENCOUNTER — Telehealth: Payer: Self-pay | Admitting: Cardiovascular Disease

## 2023-07-23 NOTE — Telephone Encounter (Signed)
 Dr. Gollan,  Mr. Discher is requesting upper endoscopy/colonoscopy.  He was last seen by you in clinic on 04/09/2023.  During that time he was doing well.  His PMH includes paroxysmal atrial fibrillation, coronary artery disease status post CABG, stenting to distal RCA 6/24, hyperlipidemia, essential hypertension.  May his Plavix  be held prior to his upcoming surgery?  Thank you for your help.  Please direct your response to CV DIV preop pool.  Juan [name]. Brigett Estell NP-C     07/23/2023, 11:32 AM [facility] [address] Office [phone] Fax [phone]

## 2023-07-23 NOTE — Telephone Encounter (Signed)
   Pre-operative Risk Assessment    Patient Name: Juan [name]  DOB: 1955/08/10 MRN: [id number]   Date of last office visit: 04/09/23 Date of next office visit: n/a   Request for Surgical Clearance    Procedure:  Colonoscopy/Upper Endoscopy  Date of Surgery:  Clearance 09/12/23                                Surgeon:  n/a Surgeon's Group or Practice Name:  [facility]  Phone number:  [phone] Fax number:  [phone]   Type of Clearance Requested:   - Pharmacy:  Hold Clopidogrel  (Plavix )     Type of Anesthesia:  Not Indicated   Additional requests/questions:    Signed, Arsenio Celine GAILS   07/23/2023, 10:46 AM

## 2023-07-23 NOTE — Telephone Encounter (Signed)
 Preoperative team,  Patient's surgical request does not include procedure being performed/surgery.  Please contact requesting office and verify surgery that patient will be having.  Thank you for your help.  Josefa HERO. Alayne Estrella NP-C     07/23/2023, 8:34 AM [facility] [address] Office [phone] Fax [phone]\

## 2023-07-23 NOTE — Telephone Encounter (Signed)
 Call placed to surgeon's office, [facility].  The phone just rung and rung.  Will fax over to the office requesting the procedure they are asking for surgical clearance for.  Please call [phone] or re-fax the clearance with information on it to [phone].

## 2023-07-24 ENCOUNTER — Telehealth: Payer: Self-pay | Admitting: *Deleted

## 2023-07-24 NOTE — Telephone Encounter (Signed)
 Pt has been scheduled tele preop appt 08/22/23. Med rec and consent are done.

## 2023-07-24 NOTE — Telephone Encounter (Signed)
 Pt has been scheduled tele preop appt 08/22/23. Med rec and consent are done.     Patient Consent for Virtual Visit        Juan [name] has provided verbal consent on 07/24/2023 for a virtual visit (video or telephone).   CONSENT FOR VIRTUAL VISIT FOR:  Juan [name]  By participating in this virtual visit I agree to the following:  I hereby voluntarily request, consent and authorize [facility] and its employed or contracted physicians, physician assistants, nurse practitioners or other licensed health [name] professionals (the Practitioner), to provide me with telemedicine health [name] services (the "Services) as deemed necessary by the treating Practitioner. I acknowledge and consent to receive the Services by the Practitioner via telemedicine. I understand that the telemedicine visit will involve communicating with the Practitioner through live audiovisual communication technology and the disclosure of certain medical information by electronic transmission. I acknowledge that I have been given the opportunity to request an in-person assessment or other available alternative prior to the telemedicine visit and am voluntarily participating in the telemedicine visit.  I understand that I have the right to withhold or withdraw my consent to the use of telemedicine in the course of my [name] at any time, without affecting my right to future [name] or treatment, and that the Practitioner or I may terminate the telemedicine visit at any time. I understand that I have the right to inspect all information obtained and/or recorded in the course of the telemedicine visit and may receive copies of available information for a reasonable fee.  I understand that some of the potential risks of receiving the Services via telemedicine include:  Delay or interruption in medical evaluation due to technological equipment failure or disruption; Information transmitted may not be sufficient (e.g.  poor resolution of images) to allow for appropriate medical decision making by the Practitioner; and/or  In rare instances, security protocols could fail, causing a breach of personal health information.  Furthermore, I acknowledge that it is my responsibility to provide information about my medical history, conditions and [name] that is complete and accurate to the best of my ability. I acknowledge that Practitioner's advice, recommendations, and/or decision may be based on factors not within their control, such as incomplete or inaccurate data provided by me or distortions of diagnostic images or specimens that may result from electronic transmissions. I understand that the practice of medicine is not an exact science and that Practitioner makes no warranties or guarantees regarding treatment outcomes. I acknowledge that a copy of this consent can be made available to me via my patient portal [facility] MyChart), or I can request a printed copy by calling the office of [facility].    I understand that my insurance will be billed for this visit.   I have read or had this consent read to me. I understand the contents of this consent, which adequately explains the benefits and risks of the Services being provided via telemedicine.  I have been provided ample opportunity to ask questions regarding this consent and the Services and have had my questions answered to my satisfaction. I give my informed consent for the services to be provided through the use of telemedicine in my medical [name]

## 2023-07-24 NOTE — Telephone Encounter (Signed)
   Name: Juan [name]  DOB: 07-13-1955  MRN: [id number]  Primary Cardiologist: Evalene Lunger, MD   Preoperative team, please contact this patient and set up a phone call appointment for further preoperative risk assessment. Please obtain consent and complete medication review. Thank you for your help.  I confirm that guidance regarding antiplatelet and oral anticoagulation therapy has been completed and, if necessary, noted below.  He can hold Plavix  5 days prior to procedure, restart Plavix  24 hours later-per Dr. Gollan  I also confirmed the patient resides in the state of [place] . As per [facility] Medical Board telemedicine laws, the patient must reside in the state in which the provider is licensed.   Josefa CHRISTELLA Beauvais, NP 07/24/2023, 8:37 AM [facility]

## 2023-07-25 NOTE — Telephone Encounter (Signed)
 Requesting office sent a duplicate. Our preop team is in process for preop clearance for the pt. Pt has tele preop appt 08/22/23. Once the pt has been cleared our team will be sure to fax clearance notes to requesting office.   I will update the requesting office pt has tele preop appt 08/22/23.

## 2023-08-02 ENCOUNTER — Encounter: Payer: Self-pay | Admitting: Internal Medicine

## 2023-08-22 ENCOUNTER — Ambulatory Visit: Attending: Cardiovascular Disease | Admitting: Nurse Practitioner

## 2023-08-22 ENCOUNTER — Encounter: Payer: Self-pay | Admitting: Nurse Practitioner

## 2023-08-22 DIAGNOSIS — Z01810 Encounter for preprocedural cardiovascular examination: Secondary | ICD-10-CM

## 2023-08-22 NOTE — Progress Notes (Signed)
 Virtual Visit via Telephone Note   Because of Hodari Chuba co-morbid illnesses, he is at least at moderate risk for complications without adequate follow up.  This format is felt to be most appropriate for this patient at this time.  Due to technical limitations with video connection (technology), today's appointment will be conducted as an audio only telehealth visit, and Mckenzie Toruno verbally agreed to proceed in this manner.   All issues noted in this document were discussed and addressed.  No physical exam could be performed with this format.  Evaluation Performed:  Preoperative cardiovascular risk assessment _____________   Date:  08/22/2023   Patient ID:  Ein Rijo, DOB 05-08-1955, MRN [id number] Patient Location:  Home Provider location:   Office  Primary Care Provider:  Valora Agent, MD Primary Cardiologist:  Evalene Lunger, MD  Chief Complaint / Patient Profile   68 y.o. y/o male with a h/o CAD, s/p CABG 2016, HLD, diabetes, PAF post CABG, HTN  who is pending colonoscopy/endoscopy on 09/12/23 and presents today for telephonic preoperative cardiovascular risk assessment.  History of Present Illness    Duard Spiewak is a 68 y.o. male who presents via audio/video conferencing for a telehealth visit today.  Pt was last seen in cardiology clinic on 04/09/23 by Dr. Gollan.  At that time Eldor Conaway was doing well.  The patient is now pending procedure as outlined above. Since his last visit, he  denies chest pain, shortness of breath, lower extremity edema, fatigue, palpitations, melena, hematuria, hemoptysis, diaphoresis, weakness, presyncope, syncope, orthopnea, and PND. He remains very active with regular walking, swimming, golfing and yard work and is able to easily achieve > 4 METS activity without concerning cardiac symptoms.    Past Medical History    Past Medical History:  Diagnosis Date   Childhood asthma    Coronary artery  disease    a. Dating back to 30 w/ h/o MI.  8 stents;  b. 10/2003 Cath [place]: LM 20d, RI 40p, LAD 30p, 39m, patent stent, D1 50p, LCX 30p, patent mid stent, 90d/diffuse, RCA dominant, 30p, patent stent, 25m/d, EF 44%-->Med Rx.   Essential hypertension    GERD (gastroesophageal reflux disease)    Heart murmur    found during office visit   History of kidney stones    Hyperlipidemia    Hypothyroidism    Mild sleep apnea    don't need mask (11/29/2016)   Myocardial infarction [facility]) 1996   small MI    Type II diabetes mellitus (HCC)    Past Surgical History:  Procedure Laterality Date   CARDIAC CATHETERIZATION  1993   [place] ; Dr.Harold; [facility].   CARDIAC CATHETERIZATION  2002   total of 8 stents by now (11/29/2016)   CARDIAC CATHETERIZATION N/A 01/21/2015   Procedure: Left Heart Cath and Coronary Angiography;  Surgeon: Deatrice DELENA Cage, MD;  Location: [facility] INVASIVE CV LAB;  Service: Cardiovascular;  Laterality: N/A;   CARDIAC CATHETERIZATION  11/24/2016   couldn't get stent in this time (11/29/2016)   COLONOSCOPY WITH PROPOFOL  N/A 04/17/2018   Procedure: COLONOSCOPY WITH PROPOFOL ;  Surgeon: Toledo, Ladell POUR, MD;  Location: [facility] ENDOSCOPY;  Service: Gastroenterology;  Laterality: N/A;   CORONARY ANGIOPLASTY WITH STENT PLACEMENT  1994   [place] ; Dr. Helayne; [facility]   CORONARY ANGIOPLASTY WITH STENT PLACEMENT  1996      CORONARY ANGIOPLASTY WITH STENT PLACEMENT     ''''   CORONARY ANGIOPLASTY WITH STENT PLACEMENT     '''  CORONARY ANGIOPLASTY WITH STENT PLACEMENT     '''   CORONARY ANGIOPLASTY WITH STENT PLACEMENT  2006   2 stents for a total of 10 stents; [facility]; [place]    CORONARY ANGIOPLASTY WITH STENT PLACEMENT  11/29/2016   1 stent   CORONARY ARTERY BYPASS GRAFT N/A 01/29/2015   Procedure: CORONARY ARTERY BYPASS GRAFTING (CABG)x four using left internal mammary artery artery and bilateral saphenous thigh vein using endoscope.;   Surgeon: Maude Fleeta Ochoa, MD;  Location: [facility] OR;  Service: Open Heart Surgery;  Laterality: N/A;   CORONARY STENT INTERVENTION N/A 11/24/2016   Procedure: CORONARY STENT INTERVENTION;  Surgeon: Darron Deatrice LABOR, MD;  Location: [facility] CV LAB;  Service: Cardiovascular;  Laterality: N/A;   CORONARY STENT INTERVENTION N/A 11/29/2016   Procedure: CORONARY STENT INTERVENTION;  Surgeon: Darron Deatrice LABOR, MD;  Location: [facility] LAB;  Service: Cardiovascular;  Laterality: N/A;   CORONARY STENT INTERVENTION N/A 07/13/2021   Procedure: CORONARY STENT INTERVENTION;  Surgeon: Mady Bruckner, MD;  Location: [facility];  Service: Cardiovascular;  Laterality: N/A;   CORONARY STENT INTERVENTION N/A 07/26/2022   Procedure: CORONARY STENT INTERVENTION;  Surgeon: Darron Deatrice LABOR, MD;  Location: [facility];  Service: Cardiovascular;  Laterality: N/A;   GANGLION CYST EXCISION Right    LEFT HEART CATH AND CORONARY ANGIOGRAPHY Left 11/24/2016   Procedure: LEFT HEART CATH AND CORONARY ANGIOGRAPHY;  Surgeon: Darron Deatrice LABOR, MD;  Location: [facility];  Service: Cardiovascular;  Laterality: Left;   LEFT HEART CATH AND CORONARY ANGIOGRAPHY N/A 07/13/2021   Procedure: LEFT HEART CATH AND CORONARY ANGIOGRAPHY;  Surgeon: Mady Bruckner, MD;  Location: [facility];  Service: Cardiovascular;  Laterality: N/A;   LEFT HEART CATH AND CORS/GRAFTS ANGIOGRAPHY N/A 07/26/2022   Procedure: LEFT HEART CATH AND CORS/GRAFTS ANGIOGRAPHY;  Surgeon: Darron Deatrice LABOR, MD;  [facility];  Service: Cardiovascular;  Laterality: N/A;   NASAL SEPTUM SURGERY     TEE WITHOUT CARDIOVERSION N/A 01/29/2015   Procedure: TRANSESOPHAGEAL ECHOCARDIOGRAM (TEE);  Surgeon: Maude Fleeta Ochoa, MD;  Location: [facility] OR;  Service: Open Heart Surgery;  Laterality: N/A;   THYROIDECTOMY      Allergies  Allergies  Allergen Reactions   Grass Pollen(K-O-R-T-Swt Vern) Other (See Comments)    Causes  sneezing    Home Medications    Prior to Admission medications   Medication Sig Start Date End Date Taking? Authorizing Provider  aspirin  EC 81 MG tablet Take 81 mg by mouth daily.    [provider]  cholecalciferol (VITAMIN D3) 25 MCG (1000 UNIT) tablet Take 1,000 Units by mouth daily.    [provider]  clopidogrel  (PLAVIX ) 75 MG tablet TAKE 1 TABLET BY MOUTH DAILY 02/01/23   Gollan, Timothy J, MD  Coenzyme Q10 (COQ10) 100 MG CAPS Take 100 mg by mouth daily.    [provider]  Continuous Glucose Sensor (FREESTYLE LIBRE 14 DAY SENSOR) MISC Use 1 kit every 14 (fourteen) days 08/21/22     empagliflozin (JARDIANCE) 25 MG TABS tablet Take 25 mg by mouth daily.    [provider]  isosorbide  mononitrate (IMDUR ) 30 MG 24 hr tablet TAKE 1 TABLET BY MOUTH DAILY 11/07/22   Gollan, Timothy J, MD  levothyroxine  (SYNTHROID ) 100 MCG tablet Take 1 tablet (100 mcg total) by mouth daily before breakfast. 02/04/15   Obadiah Maude, MD  metFORMIN  (GLUCOPHAGE -XR) 500 MG 24 hr tablet Take 1,000 mg by mouth  2 (two) times daily.    [provider]  metoprolol  tartrate (LOPRESSOR ) 100 MG tablet TAKE 1 TABLET BY MOUTH TWICE  DAILY 03/07/23   Gerard Frederick, NP  Multiple Vitamin (MULTIVITAMIN WITH MINERALS) TABS tablet Take 1 tablet by mouth 2 (two) times daily.    [provider]  nitroGLYCERIN  (NITROSTAT ) 0.4 MG SL tablet Place 1 tablet (0.4 mg total) under the tongue every 5 (five) minutes as needed for chest pain. 07/15/21   Gollan, Timothy J, MD  Omega-3 Fatty Acids (OMEGA-3 FISH OIL PO) Take 900 mg by mouth daily.    [provider]  ramipril  (ALTACE ) 10 MG capsule Take 1 capsule (10 mg total) by mouth daily. 04/25/23   Gollan, Timothy J, MD  simvastatin  (ZOCOR ) 40 MG tablet Take 1 tablet (40 mg total) by mouth every evening. 02/11/21   Gollan, Timothy J, MD    Physical Exam    Vital Signs:  [facility] does not have vital signs available for  review today.  Given telephonic nature of communication, physical exam is limited. AAOx3. NAD. Normal affect.  Speech and respirations are unlabored.  Accessory Clinical Findings    None  Assessment & Plan    1.  Preoperative Cardiovascular Risk Assessment: According to the Revised Cardiac Risk Index (RCRI), his Perioperative Risk of Major Cardiac Event is (%): 0.9. His Functional Capacity in METs is: 8.97 according to the Duke Activity Status Index (DASI). The patient is doing well from a cardiac perspective. Therefore, based on ACC/AHA guidelines, the patient would be at acceptable risk for the planned procedure without further cardiovascular testing.   The patient was advised that if he develops new symptoms prior to surgery to contact our office to arrange for a follow-up visit, and he verbalized understanding.  He can hold Plavix  5 days prior to procedure, restart Plavix  24 hours later-per Dr. Gollan   A copy of this note will be routed to requesting surgeon.  Time:   Today, I have spent 10 minutes with the patient with telehealth technology discussing medical history, symptoms, and management plan.     Rosaline EMERSON Bane, NP-C  08/22/2023, 8:43 AM [address], [address] [place], [place] [address] Office [phone] Fax [phone]

## 2023-09-12 ENCOUNTER — Other Ambulatory Visit: Payer: Self-pay

## 2023-09-12 ENCOUNTER — Ambulatory Visit
Admission: RE | Admit: 2023-09-12 | Discharge: 2023-09-12 | Disposition: A | Discharge status: Home / Self Care | Attending: Internal Medicine | Admitting: Internal Medicine

## 2023-09-12 ENCOUNTER — Encounter
Admission: RE | Disposition: A | Discharge status: Home / Self Care | Payer: Self-pay | Source: Home / Self Care | Attending: Internal Medicine

## 2023-09-12 ENCOUNTER — Ambulatory Visit: Admitting: Anesthesiology

## 2023-09-12 ENCOUNTER — Encounter: Payer: Self-pay | Admitting: Internal Medicine

## 2023-09-12 DIAGNOSIS — Z7984 Long term (current) use of oral hypoglycemic drugs: Secondary | ICD-10-CM | POA: Diagnosis not present

## 2023-09-12 DIAGNOSIS — Z7982 Long term (current) use of aspirin: Secondary | ICD-10-CM | POA: Diagnosis not present

## 2023-09-12 DIAGNOSIS — I252 Old myocardial infarction: Secondary | ICD-10-CM | POA: Diagnosis not present

## 2023-09-12 DIAGNOSIS — K2980 Duodenitis without bleeding: Secondary | ICD-10-CM | POA: Diagnosis not present

## 2023-09-12 DIAGNOSIS — Z79899 Other long term (current) drug therapy: Secondary | ICD-10-CM | POA: Diagnosis not present

## 2023-09-12 DIAGNOSIS — E119 Type 2 diabetes mellitus without complications: Secondary | ICD-10-CM | POA: Diagnosis not present

## 2023-09-12 DIAGNOSIS — E039 Hypothyroidism, unspecified: Secondary | ICD-10-CM | POA: Insufficient documentation

## 2023-09-12 DIAGNOSIS — Z79890 Hormone replacement therapy (postmenopausal): Secondary | ICD-10-CM | POA: Diagnosis not present

## 2023-09-12 DIAGNOSIS — R1310 Dysphagia, unspecified: Secondary | ICD-10-CM | POA: Diagnosis not present

## 2023-09-12 DIAGNOSIS — I2510 Atherosclerotic heart disease of native coronary artery without angina pectoris: Secondary | ICD-10-CM | POA: Diagnosis not present

## 2023-09-12 DIAGNOSIS — G4730 Sleep apnea, unspecified: Secondary | ICD-10-CM | POA: Diagnosis not present

## 2023-09-12 DIAGNOSIS — I10 Essential (primary) hypertension: Secondary | ICD-10-CM | POA: Insufficient documentation

## 2023-09-12 DIAGNOSIS — F17200 Nicotine dependence, unspecified, uncomplicated: Secondary | ICD-10-CM | POA: Diagnosis not present

## 2023-09-12 DIAGNOSIS — K3189 Other diseases of stomach and duodenum: Secondary | ICD-10-CM | POA: Diagnosis not present

## 2023-09-12 DIAGNOSIS — Z951 Presence of aortocoronary bypass graft: Secondary | ICD-10-CM | POA: Diagnosis not present

## 2023-09-12 DIAGNOSIS — I4891 Unspecified atrial fibrillation: Secondary | ICD-10-CM | POA: Diagnosis not present

## 2023-09-12 DIAGNOSIS — Z7902 Long term (current) use of antithrombotics/antiplatelets: Secondary | ICD-10-CM | POA: Insufficient documentation

## 2023-09-12 DIAGNOSIS — Z1211 Encounter for screening for malignant neoplasm of colon: Secondary | ICD-10-CM | POA: Diagnosis present

## 2023-09-12 DIAGNOSIS — D122 Benign neoplasm of ascending colon: Secondary | ICD-10-CM | POA: Insufficient documentation

## 2023-09-12 DIAGNOSIS — K2100 Gastro-esophageal reflux disease with esophagitis, without bleeding: Secondary | ICD-10-CM | POA: Diagnosis not present

## 2023-09-12 DIAGNOSIS — K2970 Gastritis, unspecified, without bleeding: Secondary | ICD-10-CM | POA: Insufficient documentation

## 2023-09-12 DIAGNOSIS — D125 Benign neoplasm of sigmoid colon: Secondary | ICD-10-CM | POA: Diagnosis not present

## 2023-09-12 DIAGNOSIS — Z955 Presence of coronary angioplasty implant and graft: Secondary | ICD-10-CM | POA: Insufficient documentation

## 2023-09-12 HISTORY — PX: ESOPHAGOGASTRODUODENOSCOPY: SHX5428

## 2023-09-12 HISTORY — PX: COLONOSCOPY: SHX5424

## 2023-09-12 HISTORY — PX: POLYPECTOMY: SHX149

## 2023-09-12 LAB — GLUCOSE, CAPILLARY: Glucose-Capillary: 194 mg/dL — ABNORMAL HIGH (ref 70–99)

## 2023-09-12 SURGERY — COLONOSCOPY
Anesthesia: General

## 2023-09-12 MED ORDER — SODIUM CHLORIDE 0.9 % IV SOLN: INTRAVENOUS | Status: DC

## 2023-09-12 MED ORDER — LIDOCAINE HCL (CARDIAC) PF 100 MG/5ML IV SOSY
PREFILLED_SYRINGE | INTRAVENOUS | Status: DC | PRN
  Administered 2023-09-12 (×2): 60 mg via INTRAVENOUS

## 2023-09-12 MED ORDER — EPHEDRINE SULFATE-NACL 50-0.9 MG/10ML-% IV SOSY
PREFILLED_SYRINGE | INTRAVENOUS | Status: DC | PRN
  Administered 2023-09-12 (×2): 15 mg via INTRAVENOUS
  Administered 2023-09-12 (×2): 10 mg via INTRAVENOUS

## 2023-09-12 MED ORDER — EPHEDRINE 5 MG/ML INJ
INTRAVENOUS | Status: AC
  Filled 2023-09-12: qty 5

## 2023-09-12 MED ORDER — PROPOFOL 10 MG/ML IV BOLUS
INTRAVENOUS | Status: DC | PRN
  Administered 2023-09-12: 20 mg via INTRAVENOUS
  Administered 2023-09-12: 30 mg via INTRAVENOUS
  Administered 2023-09-12: 20 mg via INTRAVENOUS
  Administered 2023-09-12 (×2): 50 mg via INTRAVENOUS
  Administered 2023-09-12: 30 mg via INTRAVENOUS

## 2023-09-12 MED ORDER — GLYCOPYRROLATE 0.2 MG/ML IJ SOLN
INTRAMUSCULAR | Status: AC
  Filled 2023-09-12: qty 1

## 2023-09-12 MED ORDER — GLYCOPYRROLATE 0.2 MG/ML IJ SOLN
INTRAMUSCULAR | Status: DC | PRN
  Administered 2023-09-12 (×2): .2 mg via INTRAVENOUS

## 2023-09-12 MED ORDER — PROPOFOL 500 MG/50ML IV EMUL
INTRAVENOUS | Status: DC | PRN
  Administered 2023-09-12 (×2): 75 ug/kg/min via INTRAVENOUS

## 2023-09-12 NOTE — Transfer of Care (Signed)
 Immediate Anesthesia Transfer of Care Note  Patient: Juan [name]  Procedure(s) Performed: COLONOSCOPY EGD (ESOPHAGOGASTRODUODENOSCOPY) POLYPECTOMY, INTESTINE  Patient Location: PACU  Anesthesia Type:General  Level of Consciousness: sedated  Airway & Oxygen Therapy: Patient Spontanous Breathing  Post-op Assessment: Report given to RN and Post -op Vital signs reviewed and stable  Post vital signs: Reviewed and stable  Last Vitals:  Vitals Value Taken Time  BP    Temp 36 C 09/12/23 10:05  Pulse 73 09/12/23 10:05  Resp 15 09/12/23 10:05  SpO2 97 % 09/12/23 10:05    Last Pain:  Vitals:   09/12/23 1005  TempSrc: Temporal  PainSc: 0-No pain         Complications: No notable events documented.

## 2023-09-12 NOTE — Anesthesia Postprocedure Evaluation (Signed)
 Anesthesia Post Note  Patient: Juan [name]  Procedure(s) Performed: COLONOSCOPY EGD (ESOPHAGOGASTRODUODENOSCOPY) POLYPECTOMY, INTESTINE  Patient location during evaluation: Endoscopy Anesthesia Type: General Level of consciousness: awake and alert Pain management: pain level controlled Vital Signs Assessment: post-procedure vital signs reviewed and stable Respiratory status: spontaneous breathing, nonlabored ventilation, respiratory function stable and patient connected to nasal cannula oxygen Cardiovascular status: blood pressure returned to baseline and stable Postop Assessment: no apparent nausea or vomiting Anesthetic complications: no   No notable events documented.   Last Vitals:  Vitals:   09/12/23 0810 09/12/23 1005  BP: (!) 176/77 (P) 106/63  Pulse: 63 73  Resp: 13 15  Temp: (!) 35.8 C (!) 36 C  SpO2: 99% 97%    Last Pain:  Vitals:   09/12/23 1005  TempSrc: Temporal  PainSc: 0-No pain                 [name]

## 2023-09-12 NOTE — Interval H&P Note (Signed)
 History and Physical Interval Note:  09/12/2023 9:16 AM  Juan [name] [facility]  has presented today for surgery, with the diagnosis of Hx of adenomatous colonic polyps (Z86.0101) Esophageal dysphagia (R13.19) Gastroesophageal reflux disease, unspecified whether esophagitis present (K21.9).  The various methods of treatment have been discussed with the patient and family. After consideration of risks, benefits and other options for treatment, the patient has consented to  Procedure(s): COLONOSCOPY (N/A) EGD (ESOPHAGOGASTRODUODENOSCOPY) (N/A) as a surgical intervention.  The patient's history has been reviewed, patient examined, no change in status, stable for surgery.  I have reviewed the patient's chart and labs.  Questions were answered to the patient's satisfaction.     Early, Abdurahman Rugg

## 2023-09-12 NOTE — Op Note (Addendum)
 [facility] Gastroenterology Patient Name: Juan [name] Procedure Date: 09/12/2023 9:20 AM MRN: [id number] Account #: [id number] Date of Birth: 1955-10-30 Admit Type: Outpatient Age: 68 Room: [facility] ENDO ROOM 2 Gender: Male Note Status: Finalized Instrument Name: Colon Scope [id number] Procedure:             Colonoscopy Indications:           High risk colon cancer surveillance: Personal history                         of non-advanced adenoma Providers:             Jakota Manthei K. Aundria MD, MD Referring MD:          Lynwood FALCON. Valora, MD (Referring MD) Medicines:             Propofol  per Anesthesia Complications:         No immediate complications. Estimated blood loss: None. Procedure:             Pre-Anesthesia Assessment:                        - The risks and benefits of the procedure and the                         sedation options and risks were discussed with the                         patient. All questions were answered and informed                         consent was obtained.                        - Patient identification and proposed procedure were                         verified prior to the procedure by the nurse. The                         procedure was verified in the procedure room.                        - ASA Grade Assessment: III - A patient with severe                         systemic disease.                        - After reviewing the risks and benefits, the patient                         was deemed in satisfactory condition to undergo the                         procedure.                        After obtaining informed consent, the colonoscope was  passed under direct vision. Throughout the procedure,                         the patient's blood pressure, pulse, and oxygen                         saturations were monitored continuously. The                         Colonoscope was introduced through the anus and                          advanced to the the cecum, identified by appendiceal                         orifice and ileocecal valve. The colonoscopy was                         performed without difficulty. The patient tolerated                         the procedure well. The quality of the bowel                         preparation was good. The ileocecal valve, appendiceal                         orifice, and rectum were photographed. Findings:      The perianal and digital rectal examinations were normal. Pertinent       negatives include normal sphincter tone and no palpable rectal lesions.      Two sessile polyps were found in the ascending colon. The polyps were 8       to 11 mm in size. These polyps were removed with a hot snare. Resection       and retrieval were complete. Estimated blood loss: none.      Two sessile polyps were found in the sigmoid colon. The polyps were 8 to       12 mm in size. These polyps were removed with a hot snare. Resection and       retrieval were complete. Estimated blood loss: none.      The exam was otherwise without abnormality on direct and retroflexion       views. Impression:            - Two 8 to 11 mm polyps in the ascending colon,                         removed with a hot snare. Resected and retrieved.                        - Two 8 to 12 mm polyps in the sigmoid colon, removed                         with a hot snare. Resected and retrieved.                        - The examination was otherwise normal on direct and  retroflexion views. Recommendation:        - Patient has a contact number available for                         emergencies. The signs and symptoms of potential                         delayed complications were discussed with the patient.                         Return to normal activities tomorrow. Written                         discharge instructions were provided to the patient.                        - Resume  previous diet.                        - Continue present medications.                        - Await pathology results.                        - Repeat colonoscopy is recommended for surveillance.                         The colonoscopy date will be determined after                         pathology results from today's exam become available                         for review.                        - Use Protonix  (pantoprazole ) 40 mg PO daily.                        - Follow up with Jonette Primmer, PA-C in the GI office.                         [phone]                        - Telephone GI office to schedule appointment in 3                         months.                        - The findings and recommendations were discussed with                         the patient. Procedure Code(s):     --- Professional ---                        [phone], Colonoscopy, flexible; with removal of  tumor(s), polyp(s), or other lesion(s) by snare                         technique Diagnosis Code(s):     --- Professional ---                        D12.5, Benign neoplasm of sigmoid colon                        D12.2, Benign neoplasm of ascending colon                        Z86.010, Personal history of colonic polyps CPT copyright 2022 American [employer] Association. All rights reserved. The codes documented in this report are preliminary and upon coder review may  be revised to meet current compliance requirements. Ladell MARLA Boss MD, MD 09/12/2023 10:07:05 AM This report has been signed electronically. Number of Addenda: 0 Note Initiated On: 09/12/2023 9:20 AM Scope Withdrawal Time: 0 hours 13 minutes 41 seconds  Total Procedure Duration: 0 hours 19 minutes 44 seconds  Estimated Blood Loss:  Estimated blood loss: none.      [facility]

## 2023-09-12 NOTE — Op Note (Addendum)
 [facility] Gastroenterology Patient Name: Juan [name] Procedure Date: 09/12/2023 9:21 AM MRN: [id number] Account #: [id number] Date of Birth: [birth year] Admit Type: Outpatient Age: 68 Room: [facility] ENDO ROOM 2 Gender: Male Note Status: Finalized Instrument Name: Upper GI Scope [id number] Procedure:             Upper GI endoscopy Indications:           Dysphagia, Gastro-esophageal reflux disease Providers:             Addalie Calles K. Aundria MD, MD Referring MD:          Lynwood FALCON. Valora, MD (Referring MD) Medicines:             Propofol  per Anesthesia Complications:         No immediate complications. Estimated blood loss: None. Procedure:             Pre-Anesthesia Assessment:                        - The risks and benefits of the procedure and the                         sedation options and risks were discussed with the                         patient. All questions were answered and informed                         consent was obtained.                        - Patient identification and proposed procedure were                         verified prior to the procedure by the nurse. The                         procedure was verified in the procedure room.                        - ASA Grade Assessment: III - A patient with severe                         systemic disease.                        - After reviewing the risks and benefits, the patient                         was deemed in satisfactory condition to undergo the                         procedure.                        After obtaining informed consent, the endoscope was                         passed under direct vision. Throughout the procedure,  the patient's blood pressure, pulse, and oxygen                         saturations were monitored continuously. The Endoscope                         was introduced through the mouth, and advanced to the                         third part of  duodenum. The upper GI endoscopy was                         accomplished without difficulty. The patient tolerated                         the procedure well. Findings:      LA Grade B (one or more mucosal breaks greater than 5 mm, not extending       between the tops of two mucosal folds) esophagitis with no bleeding was       found in the distal esophagus.      There is no endoscopic evidence of stenosis or stricture in the entire       esophagus.      Diffuse mild inflammation characterized by congestion (edema) and       erythema was found in the entire examined stomach.      The exam of the stomach was otherwise normal.      Diffuse mildly erythematous mucosa without active bleeding and with no       stigmata of bleeding was found in the entire duodenum. Impression:            - LA Grade B reflux esophagitis with no bleeding.                        - Gastritis.                        - Erythematous duodenopathy.                        - No specimens collected. Recommendation:        - Use Protonix  (pantoprazole ) 40 mg PO daily.                        - Proceed with colonoscopy Procedure Code(s):     --- Professional ---                        [phone], Esophagogastroduodenoscopy, flexible,                         transoral; diagnostic, including collection of                         specimen(s) by brushing or washing, when performed                         (separate procedure) Diagnosis Code(s):     --- Professional ---                        R13.10,  Dysphagia, unspecified                        K31.89, Other diseases of stomach and duodenum                        K29.70, Gastritis, unspecified, without bleeding                        K21.00, Gastro-esophageal reflux disease with                         esophagitis, without bleeding CPT copyright 2022 American Medical Association. All rights reserved. The codes documented in this report are preliminary and upon coder review may   be revised to meet current compliance requirements. Ladell MARLA Boss MD, MD 09/12/2023 9:37:32 AM This report has been signed electronically. Number of Addenda: 0 Note Initiated On: 09/12/2023 9:21 AM Estimated Blood Loss:  Estimated blood loss: none.      [facility]

## 2023-09-12 NOTE — Anesthesia Preprocedure Evaluation (Addendum)
 Anesthesia Evaluation  Patient identified by MRN, date of birth, ID band Patient awake    Reviewed: Allergy & Precautions, NPO status , Patient's Chart, lab work & pertinent test results  History of Anesthesia Complications Negative for: history of anesthetic complications  Airway Mallampati: II  TM Distance: >3 FB Neck ROM: full    Dental  (+) Partial Lower   Pulmonary sleep apnea , Current Smoker   Pulmonary exam normal        Cardiovascular hypertension, + CAD, + Past MI and + CABG  + dysrhythmias Atrial Fibrillation + Valvular Problems/Murmurs      Neuro/Psych negative neurological ROS  negative psych ROS   GI/Hepatic Neg liver ROS,GERD  ,,  Endo/Other  diabetesHypothyroidism    Renal/GU negative Renal ROS  negative genitourinary   Musculoskeletal   Abdominal   Peds  Hematology negative hematology ROS (+)   Anesthesia Other Findings Past Medical History: No date: Childhood asthma No date: Coronary artery disease     Comment:  a. Dating back to 55 w/ h/o MI.  8 stents;  b. 10/2003              Cath AL: LM 20d, RI 40p, LAD 30p, 1m, patent stent, D1               50p, LCX 30p, patent mid stent, 90d/diffuse, RCA               dominant, 30p, patent stent, 38m/d, EF 44%-->Med Rx. No date: Essential hypertension No date: GERD (gastroesophageal reflux disease) No date: Heart murmur     Comment:  found during office visit No date: History of kidney stones No date: Hyperlipidemia No date: Hypothyroidism No date: Mild sleep apnea     Comment:  don't need mask (11/29/2016) 1996: Myocardial infarction (HCC)     Comment:  small MI  No date: Type II diabetes mellitus (HCC)  Past Surgical History: 1993: CARDIAC CATHETERIZATION     Comment:  [place] ; Dr.Harold; [facility]. 2002: CARDIAC CATHETERIZATION     Comment:  total of 8 stents by now (11/29/2016) 01/21/2015: CARDIAC CATHETERIZATION; N/A      Comment:  Procedure: Left Heart Cath and Coronary Angiography;                Surgeon: Deatrice DELENA Cage, MD;  Location: [facility] INVASIVE               CV LAB;  Service: Cardiovascular;  Laterality: N/A; 11/24/2016: CARDIAC CATHETERIZATION     Comment:  couldn't get stent in this time (11/29/2016) 04/17/2018: COLONOSCOPY WITH PROPOFOL ; N/A     Comment:  Procedure: COLONOSCOPY WITH PROPOFOL ;  Surgeon: [place],               Ladell POUR, MD;  Location: [facility] ENDOSCOPY;  Service:               Gastroenterology;  Laterality: N/A; 1994: CORONARY ANGIOPLASTY WITH STENT PLACEMENT     Comment:  [place] ; Dr. Helayne; [facility] 1996: CORONARY ANGIOPLASTY WITH STENT PLACEMENT     Comment:   No date: CORONARY ANGIOPLASTY WITH STENT PLACEMENT     Comment:  '''' No date: CORONARY ANGIOPLASTY WITH STENT PLACEMENT     Comment:  ''' No date: CORONARY ANGIOPLASTY WITH STENT PLACEMENT     Comment:  ''' 2006: CORONARY ANGIOPLASTY WITH STENT PLACEMENT     Comment:  2 stents for a total of 10 stents; [facility]; [place]  11/29/2016: CORONARY ANGIOPLASTY WITH STENT PLACEMENT     Comment:  1 stent 01/29/2015: CORONARY ARTERY BYPASS GRAFT; N/A     Comment:  Procedure: CORONARY ARTERY BYPASS GRAFTING (CABG)x four               using left internal mammary artery artery and bilateral               saphenous thigh vein using endoscope.;  Surgeon: Maude Fleeta Ochoa, MD;  Location: [facility] OR;  Service: Open Heart               Surgery;  Laterality: N/A; 11/24/2016: CORONARY STENT INTERVENTION; N/A     Comment:  Procedure: CORONARY STENT INTERVENTION;  Surgeon: Darron Deatrice LABOR, MD;  Location: [facility] INVASIVE CV LAB;                Service: Cardiovascular;  Laterality: N/A; 11/29/2016: CORONARY STENT INTERVENTION; N/A     Comment:  Procedure: CORONARY STENT INTERVENTION;  Surgeon: Darron Deatrice LABOR, MD;  Location: [facility] INVASIVE CV LAB;  Service:                Cardiovascular;  Laterality: N/A; 07/13/2021: CORONARY STENT INTERVENTION; N/A     Comment:  Procedure: CORONARY STENT INTERVENTION;  Surgeon: Mady Bruckner, MD;  Location: [facility] INVASIVE CV LAB;                Service: Cardiovascular;  Laterality: N/A; 07/26/2022: CORONARY STENT INTERVENTION; N/A     Comment:  Procedure: CORONARY STENT INTERVENTION;  Surgeon: Darron Deatrice LABOR, MD;  Location: [facility] INVASIVE CV LAB;  Service:               Cardiovascular;  Laterality: N/A; No date: GANGLION CYST EXCISION; Right 11/24/2016: LEFT HEART CATH AND CORONARY ANGIOGRAPHY; Left     Comment:  Procedure: LEFT HEART CATH AND CORONARY ANGIOGRAPHY;                Surgeon: Darron Deatrice LABOR, MD;  Location: [facility] INVASIVE               CV LAB;  Service: Cardiovascular;  Laterality: Left; 07/13/2021: LEFT HEART CATH AND CORONARY ANGIOGRAPHY; N/A     Comment:  Procedure: LEFT HEART CATH AND CORONARY ANGIOGRAPHY;                Surgeon: Mady Bruckner, MD;  Location: [facility] INVASIVE               CV LAB;  Service: Cardiovascular;  Laterality: N/A; 07/26/2022: LEFT HEART CATH AND CORS/GRAFTS ANGIOGRAPHY; N/A     Comment:  Procedure: LEFT HEART CATH AND CORS/GRAFTS ANGIOGRAPHY;               Surgeon: Darron Deatrice LABOR, MD;  Location: [facility] INVASIVE CV              LAB;  Service: Cardiovascular;  Laterality: N/A; No date: NASAL SEPTUM SURGERY 01/29/2015: TEE WITHOUT CARDIOVERSION; N/A     Comment:  Procedure: TRANSESOPHAGEAL ECHOCARDIOGRAM (TEE);  Surgeon: Maude Fleeta Ochoa, MD;  Location: [facility] OR;  Service:              Open Heart Surgery;  Laterality: N/A; No date: THYROIDECTOMY     Reproductive/Obstetrics negative OB ROS                              Anesthesia Physical Anesthesia Plan  ASA: 3  Anesthesia Plan: General   Post-op Pain Management: Minimal or no pain anticipated   Induction: Intravenous  PONV Risk Score and Plan: 1 and  Propofol  infusion and TIVA  Airway Management Planned: Natural Airway and Nasal Cannula  Additional Equipment:   Intra-op Plan:   Post-operative Plan:   Informed Consent: I have reviewed the patients History and Physical, chart, labs and discussed the procedure including the risks, benefits and alternatives for the proposed anesthesia with the patient or authorized representative who has indicated his/her understanding and acceptance.     Dental Advisory Given  Plan Discussed with: Anesthesiologist, CRNA and Surgeon  Anesthesia Plan Comments: (Patient consented for risks of anesthesia including but not limited to:  - adverse reactions to medications - risk of airway placement if required - damage to eyes, teeth, lips or other oral mucosa - nerve damage due to positioning  - sore throat or hoarseness - Damage to heart, brain, nerves, lungs, other parts of body or loss of life  Patient voiced understanding and assent.)         Anesthesia Quick Evaluation

## 2023-09-12 NOTE — H&P (Signed)
 Outpatient short stay form Pre-procedure 09/12/2023 9:15 AM Trenden Hazelrigg K. Aundria, M.D.  Primary Physician: Lynwood Null, M.D.  Reason for visit:  Personal history of adenomatous colon polyps  History of present illness:                            Patient presents for colonoscopy for a personal hx of colon polyps. The patient denies abdominal pain, abnormal weight loss or rectal bleeding.  Mr. Anne presents to the [facility] at the request of his PCP for chief complaint of high-risk colon cancer surveillance 2/2 personal history of adenomatous colon polyps and esophageal dysphagia. He is due for repeat colonoscopy. He had screening colonoscopy performed March 2020 by Dr. Aundria which removed two subcentimeter tubular adenomas from his transverse colon. He denies any major changes to his bowel habits. He does feel like he has more frequent bowel movements due to Metformin  and Jardiance. He denies any issues with hematochezia or melena. He denies any frequent abdominal pain or abdominal cramping. Appetite and diet are stable without any unintentional weight loss. He uses TUMs as needed for heartburn and reflux symptoms. He has noticed worsening dysphagia symptoms to solid foods and some of his pills localized to suprasternal notch over the past 2-3 years. Symptoms are occurring more often over the past 1 year. He feels like some dried foods will hang up at level just above his suprasternal notch. Things will eventually pass on down after taking his time and drinking some water. His pills can feel like they get stuck for a few seconds before slowly going down. He does not have to regurgitate any food boluses. He has no issues handling his own secretions. He denies any liquid dysphagia. He denies any odynophagia, early satiety, epigastric abdominal pain, nausea, or vomiting. GERD symptoms, heartburn mainly, occur if he overeats or eats something spicy. He takes a TUMs several times a week. No previous  endoscopy. He follows closely in Cardiology with Dr. Gollan and has had multiple cardiac catheterizations with interventions dating back to 1994. He did have stent placed to distal RCA in June 2024. He is on chronic antiplatelet therapy with ASA and Plavix . No issues with GI bleeding.      Current Facility-Administered Medications:    0.9 %  sodium chloride  infusion, , Intravenous, Continuous, [place], Taylan Marez K, MD, Last Rate: 20 mL/hr at 09/12/23 [id number], Continued from Pre-op at 09/12/23 0838  Medications Prior to Admission  Medication Sig Dispense Refill Last Dose/Taking   aspirin  EC 81 MG tablet Take 81 mg by mouth daily.   Past Week   cholecalciferol (VITAMIN D3) 25 MCG (1000 UNIT) tablet Take 1,000 Units by mouth daily.   Past Week   clopidogrel  (PLAVIX ) 75 MG tablet TAKE 1 TABLET BY MOUTH DAILY 100 tablet 2 Past Week   Coenzyme Q10 (COQ10) 100 MG CAPS Take 100 mg by mouth daily.   Past Week   empagliflozin (JARDIANCE) 25 MG TABS tablet Take 25 mg by mouth daily.   Past Week   isosorbide  mononitrate (IMDUR ) 30 MG 24 hr tablet TAKE 1 TABLET BY MOUTH DAILY 90 tablet 2 Past Week   levothyroxine  (SYNTHROID ) 100 MCG tablet Take 1 tablet (100 mcg total) by mouth daily before breakfast. 90 tablet 0 09/12/2023 Morning   metFORMIN  (GLUCOPHAGE -XR) 500 MG 24 hr tablet Take 1,000 mg by mouth 2 (two) times daily.   Past Week   metoprolol  tartrate (LOPRESSOR ) 100 MG tablet  TAKE 1 TABLET BY MOUTH TWICE  DAILY 180 tablet 3 09/12/2023 Morning   Multiple Vitamin (MULTIVITAMIN WITH MINERALS) TABS tablet Take 1 tablet by mouth 2 (two) times daily.   Past Week   Omega-3 Fatty Acids (OMEGA-3 FISH OIL PO) Take 900 mg by mouth daily.   Past Week   ramipril  (ALTACE ) 10 MG capsule Take 1 capsule (10 mg total) by mouth daily. 14 capsule 0 09/12/2023 Morning   simvastatin  (ZOCOR ) 40 MG tablet Take 1 tablet (40 mg total) by mouth every evening. 90 tablet 3 09/11/2023   Continuous Glucose Sensor (FREESTYLE LIBRE 14 DAY  SENSOR) MISC Use 1 kit every 14 (fourteen) days 6 each 3    nitroGLYCERIN  (NITROSTAT ) 0.4 MG SL tablet Place 1 tablet (0.4 mg total) under the tongue every 5 (five) minutes as needed for chest pain. 25 tablet 1      Allergies  Allergen Reactions   Grass Pollen(K-O-R-T-Swt Vern) Other (See Comments)    Causes sneezing     Past Medical History:  Diagnosis Date   Childhood asthma    Coronary artery disease    a. Dating back to 27 w/ h/o MI.  8 stents;  b. 10/2003 Cath AL: LM 20d, RI 40p, LAD 30p, 8m, patent stent, D1 50p, LCX 30p, patent mid stent, 90d/diffuse, RCA dominant, 30p, patent stent, 57m/d, EF 44%-->Med Rx.   Essential hypertension    GERD (gastroesophageal reflux disease)    Heart murmur    found during office visit   History of kidney stones    Hyperlipidemia    Hypothyroidism    Mild sleep apnea    don't need mask (11/29/2016)   Myocardial infarction (HCC) 1996   small MI    Type II diabetes mellitus (HCC)     Review of systems:  Otherwise negative.    Physical Exam  Gen: Alert, oriented. Appears stated age.  HEENT: [place]/AT. PERRLA. Lungs: CTA, no wheezes. CV: RR nl S1, S2. Abd: soft, benign, no masses. BS+ Ext: No edema. Pulses 2+    Planned procedures: Proceed with EGD and colonoscopy. The patient understands the nature of the planned procedure, indications, risks, alternatives and potential complications including but not limited to bleeding, infection, perforation, damage to internal organs and possible oversedation/side effects from anesthesia. The patient agrees and gives consent to proceed.  Please refer to procedure notes for findings, recommendations and patient disposition/instructions.     Eavan Gonterman K. Aundria, M.D. Gastroenterology 09/12/2023  9:15 AM

## 2023-09-13 LAB — SURGICAL PATHOLOGY

## 2023-11-06 ENCOUNTER — Other Ambulatory Visit: Payer: Self-pay | Admitting: Cardiovascular Disease

## 2023-11-29 ENCOUNTER — Other Ambulatory Visit: Payer: Self-pay | Admitting: Cardiovascular Disease

## 2024-02-07 ENCOUNTER — Other Ambulatory Visit: Payer: Self-pay | Admitting: Cardiovascular Disease

## 2024-04-15 ENCOUNTER — Ambulatory Visit: Admitting: Cardiovascular Disease
